# Patient Record
Sex: Female | Born: 1945
Health system: Southern US, Community
[De-identification: ages and names within clinical notes are randomized; demographics above are authoritative.]

## PROBLEM LIST (undated history)

## (undated) DIAGNOSIS — D649 Anemia, unspecified: Secondary | ICD-10-CM

## (undated) DIAGNOSIS — Z800 Family history of malignant neoplasm of digestive organs: Secondary | ICD-10-CM

## (undated) DIAGNOSIS — Z803 Family history of malignant neoplasm of breast: Secondary | ICD-10-CM

## (undated) DIAGNOSIS — D469 Myelodysplastic syndrome, unspecified: Secondary | ICD-10-CM

## (undated) DIAGNOSIS — Z9889 Other specified postprocedural states: Secondary | ICD-10-CM

## (undated) DIAGNOSIS — R112 Nausea with vomiting, unspecified: Secondary | ICD-10-CM

## (undated) DIAGNOSIS — G629 Polyneuropathy, unspecified: Secondary | ICD-10-CM

## (undated) DIAGNOSIS — Z9221 Personal history of antineoplastic chemotherapy: Secondary | ICD-10-CM

## (undated) DIAGNOSIS — I471 Supraventricular tachycardia, unspecified: Secondary | ICD-10-CM

## (undated) DIAGNOSIS — D6861 Antiphospholipid syndrome: Secondary | ICD-10-CM

## (undated) DIAGNOSIS — C4490 Unspecified malignant neoplasm of skin, unspecified: Secondary | ICD-10-CM

## (undated) DIAGNOSIS — Z923 Personal history of irradiation: Secondary | ICD-10-CM

## (undated) DIAGNOSIS — C50919 Malignant neoplasm of unspecified site of unspecified female breast: Secondary | ICD-10-CM

## (undated) DIAGNOSIS — IMO0001 Reserved for inherently not codable concepts without codable children: Secondary | ICD-10-CM

## (undated) DIAGNOSIS — Z8041 Family history of malignant neoplasm of ovary: Secondary | ICD-10-CM

## (undated) HISTORY — DX: Family history of malignant neoplasm of breast: Z80.3

## (undated) HISTORY — PX: DILATION AND CURETTAGE OF UTERUS: SHX78

## (undated) HISTORY — DX: Personal history of antineoplastic chemotherapy: Z92.21

## (undated) HISTORY — DX: Reserved for inherently not codable concepts without codable children: IMO0001

## (undated) HISTORY — DX: Family history of malignant neoplasm of digestive organs: Z80.0

## (undated) HISTORY — DX: Unspecified malignant neoplasm of skin, unspecified: C44.90

## (undated) HISTORY — DX: Supraventricular tachycardia, unspecified: I47.10

## (undated) HISTORY — DX: Family history of malignant neoplasm of ovary: Z80.41

## (undated) HISTORY — DX: Myelodysplastic syndrome, unspecified: D46.9

## (undated) HISTORY — DX: Anemia, unspecified: D64.9

## (undated) HISTORY — PX: EYE SURGERY: SHX253

## (undated) HISTORY — PX: MASTECTOMY: SHX3

## (undated) HISTORY — PX: AUGMENTATION MAMMAPLASTY: SUR837

## (undated) HISTORY — DX: Polyneuropathy, unspecified: G62.9

## (undated) HISTORY — DX: Antiphospholipid syndrome: D68.61

## (undated) HISTORY — PX: COLONOSCOPY: SHX174

## (undated) HISTORY — DX: Supraventricular tachycardia: I47.1

---

## 1997-09-28 ENCOUNTER — Other Ambulatory Visit: Admission: RE | Admit: 1997-09-28 | Discharge: 1997-09-28 | Payer: Self-pay | Admitting: Obstetrics and Gynecology

## 1998-06-16 ENCOUNTER — Emergency Department (HOSPITAL_COMMUNITY): Admission: EM | Admit: 1998-06-16 | Discharge: 1998-06-16 | Payer: Self-pay | Admitting: Emergency Medicine

## 1999-01-28 ENCOUNTER — Other Ambulatory Visit: Admission: RE | Admit: 1999-01-28 | Discharge: 1999-01-28 | Payer: Self-pay | Admitting: Obstetrics and Gynecology

## 2000-03-16 ENCOUNTER — Other Ambulatory Visit: Admission: RE | Admit: 2000-03-16 | Discharge: 2000-03-16 | Payer: Self-pay | Admitting: Obstetrics and Gynecology

## 2000-08-09 ENCOUNTER — Emergency Department (HOSPITAL_COMMUNITY): Admission: EM | Admit: 2000-08-09 | Discharge: 2000-08-10 | Payer: Self-pay | Admitting: Internal Medicine

## 2000-08-09 ENCOUNTER — Encounter: Payer: Self-pay | Admitting: Emergency Medicine

## 2001-08-16 ENCOUNTER — Other Ambulatory Visit: Admission: RE | Admit: 2001-08-16 | Discharge: 2001-08-16 | Payer: Self-pay | Admitting: Obstetrics and Gynecology

## 2003-03-04 ENCOUNTER — Emergency Department (HOSPITAL_COMMUNITY): Admission: EM | Admit: 2003-03-04 | Discharge: 2003-03-04 | Payer: Self-pay

## 2003-11-27 ENCOUNTER — Other Ambulatory Visit: Admission: RE | Admit: 2003-11-27 | Discharge: 2003-11-27 | Payer: Self-pay | Admitting: Obstetrics and Gynecology

## 2005-01-30 ENCOUNTER — Encounter: Admission: RE | Admit: 2005-01-30 | Discharge: 2005-01-30 | Payer: Self-pay | Admitting: Obstetrics and Gynecology

## 2005-02-11 ENCOUNTER — Other Ambulatory Visit: Admission: RE | Admit: 2005-02-11 | Discharge: 2005-02-11 | Payer: Self-pay | Admitting: Obstetrics and Gynecology

## 2005-11-04 ENCOUNTER — Observation Stay (HOSPITAL_COMMUNITY): Admission: EM | Admit: 2005-11-04 | Discharge: 2005-11-04 | Payer: Self-pay | Admitting: Emergency Medicine

## 2010-07-01 ENCOUNTER — Other Ambulatory Visit: Payer: Self-pay | Admitting: Cardiology

## 2010-07-01 NOTE — Telephone Encounter (Signed)
escribe medication per fax request  

## 2010-12-26 ENCOUNTER — Other Ambulatory Visit: Payer: Self-pay | Admitting: Cardiology

## 2010-12-29 ENCOUNTER — Telehealth: Payer: Self-pay | Admitting: Cardiology

## 2010-12-29 MED ORDER — METOPROLOL SUCCINATE ER 100 MG PO TB24: 100.0000 mg | ORAL_TABLET | Freq: Every day | ORAL | Status: DC

## 2010-12-29 NOTE — Telephone Encounter (Signed)
Pt called and said the pharmacy told her that is was denied until she made an appt with Dr [place] She made a followup on December 5 She needs refill metorprolol she has not pills left Please call to [employer]

## 2011-02-04 ENCOUNTER — Ambulatory Visit (INDEPENDENT_AMBULATORY_CARE_PROVIDER_SITE_OTHER): Payer: BC Managed Care – PPO | Admitting: Cardiology

## 2011-02-04 ENCOUNTER — Encounter: Payer: Self-pay | Admitting: Cardiology

## 2011-02-04 VITALS — BP 118/72 | HR 71 | Ht 65.0 in | Wt 192.8 lb

## 2011-02-04 DIAGNOSIS — I471 Supraventricular tachycardia: Secondary | ICD-10-CM

## 2011-02-04 DIAGNOSIS — I498 Other specified cardiac arrhythmias: Secondary | ICD-10-CM

## 2011-02-04 MED ORDER — METOPROLOL SUCCINATE ER 100 MG PO TB24: 100.0000 mg | ORAL_TABLET | Freq: Every day | ORAL | Status: DC

## 2011-02-04 NOTE — Patient Instructions (Signed)
Continue your current therapy  I will see you again in 1 year.   

## 2011-02-04 NOTE — Progress Notes (Signed)
   Marie Anderson Date of Birth: May 15, 1945 Medical Record #[id number]  History of Present Illness: Marie Anderson is seen for yearly followup. She has a history of supraventricular tachycardia that has been well controlled with metoprolol. She is currently taking 100 mg daily. She cannot recall when she last had an episode of tachycardia. The last episode I have a record of was in September of 2007. She is feeling well. She denies any dyspnea, chest pain, or fatigue.  No current outpatient prescriptions on file prior to visit.    Allergies  Allergen Reactions  . Codeine     Past Medical History  Diagnosis Date  . SVT (supraventricular tachycardia)   . Anemia     No past surgical history on file.  History  Smoking status  . Never Smoker   Smokeless tobacco  . Not on file    History  Alcohol Use: Not on file    No family history on file.  Review of Systems: As noted in history of present illness.  All other systems were reviewed and are negative.  Physical Exam: BP 118/72  Pulse 71  Ht 5\' 5"  (1.651 m)  Wt 87.454 kg (192 lb 12.8 oz)  BMI 32.08 kg/m2 The patient is alert and oriented x 3.  The mood and affect are normal.  The skin is warm and dry.  Color is normal.  The HEENT exam reveals that the sclera are nonicteric.  The mucous membranes are moist.  The carotids are 2+ without bruits.  There is no thyromegaly.  There is no JVD.  The lungs are clear.  The chest wall is non tender.  The heart exam reveals a regular rate with a normal S1 and S2.  There are no murmurs, gallops, or rubs.  The PMI is not displaced.   Abdominal exam reveals good bowel sounds.  There is no guarding or rebound.  There is no hepatosplenomegaly or tenderness.  There are no masses.  Exam of the legs reveal no clubbing, cyanosis, or edema.  The legs are without rashes.  The distal pulses are intact.  Cranial nerves II - XII are intact.  Motor and sensory functions are intact.  The gait is  normal.  LABORATORY DATA: ECG today demonstrates normal sinus rhythm with nonspecific ST-T wave abnormality.  Assessment / Plan:

## 2011-02-04 NOTE — Assessment & Plan Note (Signed)
Her arrhythmia is very well controlled on metoprolol. We agreed that we should continue on metoprolol at 100 mg daily. I'll followup again in one year.

## 2011-02-18 ENCOUNTER — Other Ambulatory Visit: Payer: Self-pay | Admitting: Cardiology

## 2011-11-27 ENCOUNTER — Other Ambulatory Visit: Payer: Self-pay | Admitting: Obstetrics and Gynecology

## 2011-11-27 DIAGNOSIS — N63 Unspecified lump in unspecified breast: Secondary | ICD-10-CM

## 2011-12-01 ENCOUNTER — Ambulatory Visit
Admission: RE | Admit: 2011-12-01 | Discharge: 2011-12-01 | Disposition: A | Discharge status: Home / Self Care | Payer: BC Managed Care – PPO | Source: Ambulatory Visit | Attending: Obstetrics and Gynecology | Admitting: Obstetrics and Gynecology

## 2011-12-01 ENCOUNTER — Other Ambulatory Visit: Payer: Self-pay | Admitting: Obstetrics and Gynecology

## 2011-12-01 DIAGNOSIS — N63 Unspecified lump in unspecified breast: Secondary | ICD-10-CM

## 2011-12-04 ENCOUNTER — Other Ambulatory Visit: Payer: Self-pay | Admitting: Obstetrics and Gynecology

## 2011-12-04 ENCOUNTER — Ambulatory Visit
Admission: RE | Admit: 2011-12-04 | Discharge: 2011-12-04 | Disposition: A | Discharge status: Home / Self Care | Payer: BC Managed Care – PPO | Source: Ambulatory Visit | Attending: Obstetrics and Gynecology | Admitting: Obstetrics and Gynecology

## 2011-12-04 DIAGNOSIS — N63 Unspecified lump in unspecified breast: Secondary | ICD-10-CM

## 2011-12-04 DIAGNOSIS — C50919 Malignant neoplasm of unspecified site of unspecified female breast: Secondary | ICD-10-CM

## 2011-12-04 HISTORY — DX: Malignant neoplasm of unspecified site of unspecified female breast: C50.919

## 2011-12-07 ENCOUNTER — Other Ambulatory Visit: Payer: BC Managed Care – PPO

## 2011-12-07 ENCOUNTER — Other Ambulatory Visit: Payer: Self-pay | Admitting: Obstetrics and Gynecology

## 2011-12-07 ENCOUNTER — Ambulatory Visit
Admission: RE | Admit: 2011-12-07 | Discharge: 2011-12-07 | Disposition: A | Discharge status: Home / Self Care | Payer: BC Managed Care – PPO | Source: Ambulatory Visit | Attending: Obstetrics and Gynecology | Admitting: Obstetrics and Gynecology

## 2011-12-07 DIAGNOSIS — C50912 Malignant neoplasm of unspecified site of left female breast: Secondary | ICD-10-CM

## 2011-12-07 DIAGNOSIS — N63 Unspecified lump in unspecified breast: Secondary | ICD-10-CM

## 2011-12-10 ENCOUNTER — Telehealth: Payer: Self-pay | Admitting: *Deleted

## 2011-12-10 DIAGNOSIS — C50219 Malignant neoplasm of upper-inner quadrant of unspecified female breast: Secondary | ICD-10-CM

## 2011-12-10 DIAGNOSIS — C50212 Malignant neoplasm of upper-inner quadrant of left female breast: Secondary | ICD-10-CM | POA: Insufficient documentation

## 2011-12-10 NOTE — Telephone Encounter (Signed)
Confirmed [facility] for 12/16/11 at 0800 .  Instructions and contact information given.  

## 2011-12-11 ENCOUNTER — Ambulatory Visit
Admission: RE | Admit: 2011-12-11 | Discharge: 2011-12-11 | Disposition: A | Discharge status: Home / Self Care | Payer: BC Managed Care – PPO | Source: Ambulatory Visit | Attending: Obstetrics and Gynecology | Admitting: Obstetrics and Gynecology

## 2011-12-11 DIAGNOSIS — C50912 Malignant neoplasm of unspecified site of left female breast: Secondary | ICD-10-CM

## 2011-12-11 MED ORDER — GADOBENATE DIMEGLUMINE 529 MG/ML IV SOLN
20.0000 mL | Freq: Once | INTRAVENOUS | Status: AC | PRN
  Administered 2011-12-11: 20 mL via INTRAVENOUS

## 2011-12-14 ENCOUNTER — Other Ambulatory Visit: Payer: Self-pay | Admitting: Obstetrics and Gynecology

## 2011-12-14 ENCOUNTER — Ambulatory Visit
Admission: RE | Admit: 2011-12-14 | Discharge: 2011-12-14 | Disposition: A | Discharge status: Home / Self Care | Payer: BC Managed Care – PPO | Source: Ambulatory Visit | Attending: Obstetrics and Gynecology | Admitting: Obstetrics and Gynecology

## 2011-12-14 DIAGNOSIS — R928 Other abnormal and inconclusive findings on diagnostic imaging of breast: Secondary | ICD-10-CM

## 2011-12-16 ENCOUNTER — Ambulatory Visit (HOSPITAL_BASED_OUTPATIENT_CLINIC_OR_DEPARTMENT_OTHER): Payer: BC Managed Care – PPO | Admitting: General Surgery

## 2011-12-16 ENCOUNTER — Ambulatory Visit
Admission: RE | Admit: 2011-12-16 | Discharge: 2011-12-16 | Disposition: A | Discharge status: Home / Self Care | Payer: BC Managed Care – PPO | Source: Ambulatory Visit | Attending: Radiation Oncology | Admitting: Radiation Oncology

## 2011-12-16 ENCOUNTER — Encounter (INDEPENDENT_AMBULATORY_CARE_PROVIDER_SITE_OTHER): Payer: Self-pay | Admitting: General Surgery

## 2011-12-16 ENCOUNTER — Encounter: Payer: Self-pay | Admitting: *Deleted

## 2011-12-16 ENCOUNTER — Other Ambulatory Visit (INDEPENDENT_AMBULATORY_CARE_PROVIDER_SITE_OTHER): Payer: Self-pay | Admitting: General Surgery

## 2011-12-16 ENCOUNTER — Ambulatory Visit (HOSPITAL_BASED_OUTPATIENT_CLINIC_OR_DEPARTMENT_OTHER): Payer: BC Managed Care – PPO | Admitting: Oncology

## 2011-12-16 ENCOUNTER — Ambulatory Visit (HOSPITAL_BASED_OUTPATIENT_CLINIC_OR_DEPARTMENT_OTHER): Payer: BC Managed Care – PPO

## 2011-12-16 ENCOUNTER — Encounter: Payer: Self-pay | Admitting: Oncology

## 2011-12-16 ENCOUNTER — Other Ambulatory Visit (HOSPITAL_BASED_OUTPATIENT_CLINIC_OR_DEPARTMENT_OTHER): Payer: BC Managed Care – PPO | Admitting: Lab

## 2011-12-16 ENCOUNTER — Telehealth: Payer: Self-pay | Admitting: *Deleted

## 2011-12-16 ENCOUNTER — Telehealth: Payer: Self-pay | Admitting: Oncology

## 2011-12-16 ENCOUNTER — Ambulatory Visit: Payer: BC Managed Care – PPO | Attending: General Surgery | Admitting: Physical Therapy

## 2011-12-16 VITALS — BP 160/92 | HR 76 | Temp 98.3°F | Resp 20 | Ht 65.0 in | Wt 208.0 lb

## 2011-12-16 DIAGNOSIS — C50919 Malignant neoplasm of unspecified site of unspecified female breast: Secondary | ICD-10-CM | POA: Insufficient documentation

## 2011-12-16 DIAGNOSIS — C50219 Malignant neoplasm of upper-inner quadrant of unspecified female breast: Secondary | ICD-10-CM

## 2011-12-16 DIAGNOSIS — M25619 Stiffness of unspecified shoulder, not elsewhere classified: Secondary | ICD-10-CM | POA: Insufficient documentation

## 2011-12-16 DIAGNOSIS — R293 Abnormal posture: Secondary | ICD-10-CM | POA: Insufficient documentation

## 2011-12-16 DIAGNOSIS — Z170 Estrogen receptor positive status [ER+]: Secondary | ICD-10-CM

## 2011-12-16 DIAGNOSIS — IMO0001 Reserved for inherently not codable concepts without codable children: Secondary | ICD-10-CM | POA: Insufficient documentation

## 2011-12-16 LAB — CBC WITH DIFFERENTIAL/PLATELET
BASO%: 0.4 % (ref 0.0–2.0)
EOS%: 1 % (ref 0.0–7.0)
HGB: 14.6 g/dL (ref 11.6–15.9)
MCH: 33.7 pg (ref 25.1–34.0)
MCHC: 34.2 g/dL (ref 31.5–36.0)
MCV: 98.5 fL (ref 79.5–101.0)
MONO%: 7.6 % (ref 0.0–14.0)
RBC: 4.33 10*6/uL (ref 3.70–5.45)
RDW: 13.8 % (ref 11.2–14.5)
lymph#: 1 10*3/uL (ref 0.9–3.3)

## 2011-12-16 LAB — COMPREHENSIVE METABOLIC PANEL (CC13)
ALT: 19 U/L (ref 0–55)
AST: 20 U/L (ref 5–34)
Albumin: 4 g/dL (ref 3.5–5.0)
Alkaline Phosphatase: 78 U/L (ref 40–150)
BUN: 10 mg/dL (ref 7.0–26.0)
Calcium: 9.2 mg/dL (ref 8.4–10.4)
Chloride: 108 mEq/L — ABNORMAL HIGH (ref 98–107)
Potassium: 4 mEq/L (ref 3.5–5.1)
Sodium: 141 mEq/L (ref 136–145)
Total Protein: 7.4 g/dL (ref 6.4–8.3)

## 2011-12-16 LAB — CANCER ANTIGEN 27.29: CA 27.29: 70 U/mL — ABNORMAL HIGH (ref 0–39)

## 2011-12-16 NOTE — Progress Notes (Signed)
Patient ID: RAVNEET SPILKER, female   DOB: [birth year], 66 y.o.   MRN: [id number]  Chief Complaint  Patient presents with  . Other    breast cancer     HPI DONISE WOODLE is a 66 y.o. female.  Referred by Dr. Richardean Chimera HPI 30 yof who has felt palpable left breast mass for the last 2 weeks.  She has no prior breast history and no family history of breast or ovarian cancer.  She underwent mmg that an irregular mass with pleomorphic and linear calcifications.  Calcs span about 2 cm.  Ultrasound shows a 10x9x35mm irregular hypoechoic at 12 o'clock position 5 cm from nipple.  A 3 mm satellite nodule lies 3 mm superomedial to the dominant mass.  Ultrasound guided biopsy performed with clip placement.  This biopsy shows grade II invasive ductal carcinoma with DCIS.  This is er positive at 100, pr positive at 70, Ki67 is 94% and her2 neu is negative.  She has since undergone an mr that shows a 2.7x2.2x2 cm cancer with another mass in left breast at 3 o'clock measuring 1.1x1.1x1 cm tumor.  Path shows IDC.  Clips are 6.8 cm farthest apart on mmg.  She is very interested in an attempt at [facility] and comes today to [facility] to discuss these options.  Past Medical History  Diagnosis Date  . Anemia   . SVT (supraventricular tachycardia)     Past Surgical History  Procedure Date  . Eye surgery     age 13    History reviewed. No pertinent family history.  Social History History  Substance Use Topics  . Smoking status: Never Smoker   . Smokeless tobacco: Never Used  . Alcohol Use: Yes    Allergies  Allergen Reactions  . Cephalosporins     headache  . Codeine Nausea Only    Current Outpatient Prescriptions  Medication Sig Dispense Refill  . Ascorbic Acid (VITAMIN C PO) Take 500 mg by mouth.       Marland Kitchen CALCIUM PO Take 1,200 mg by mouth.       . Cholecalciferol (VITAMIN D PO) Take 500 mg by mouth daily.       . Cyanocobalamin (VITAMIN B 12 PO) Take 3,000 mg by mouth daily.       Marland Kitchen ECHINACEA PO Take  800 mg by mouth daily.       . IRON PO Take 65 mg by mouth daily.       . metoprolol (TOPROL-XL) 100 MG 24 hr tablet Take 1 tablet (100 mg total) by mouth daily.  90 tablet  3    Review of Systems Review of Systems  Constitutional: Negative for fever, chills and unexpected weight change.  HENT: Negative for hearing loss, congestion, sore throat, trouble swallowing and voice change.   Eyes: Negative for visual disturbance.  Respiratory: Negative for cough and wheezing.   Cardiovascular: Negative for chest pain, palpitations and leg swelling.  Gastrointestinal: Negative for nausea, vomiting, abdominal pain, diarrhea, constipation, blood in stool, abdominal distention and anal bleeding.  Genitourinary: Negative for hematuria, vaginal bleeding and difficulty urinating.  Musculoskeletal: Negative for arthralgias.  Skin: Negative for rash and wound.  Neurological: Negative for seizures, syncope and headaches.  Hematological: Negative for adenopathy. Does not bruise/bleed easily.  Psychiatric/Behavioral: Negative for confusion.    There were no vitals taken for this visit.  Physical Exam Physical Exam  Vitals reviewed. Constitutional: She appears well-developed and well-nourished.  Neck: Neck supple.  Cardiovascular: Normal rate,  regular rhythm and normal heart sounds.   Pulmonary/Chest: Effort normal and breath sounds normal. She has no wheezes. She has no rales. Right breast exhibits no inverted nipple, no mass, no nipple discharge, no skin change and no tenderness. Left breast exhibits mass. Left breast exhibits no inverted nipple, no nipple discharge, no skin change and no tenderness. Breasts are symmetrical.    Lymphadenopathy:    She has no cervical adenopathy.    She has no axillary adenopathy.       Right: No supraclavicular adenopathy present.       Left: No supraclavicular adenopathy present.    Data Reviewed   BILATERAL BREAST MRI WITH AND WITHOUT CONTRAST   Technique:  Multiplanar, multisequence MR images of both breasts were obtained prior to and following the intravenous administration of 20ml of Multihance.  Three dimensional images were evaluated at the independent DynaCad workstation.   Comparison:  Prior mammograms and ultrasound   Findings: Post biopsy change is noted in the left breast 12 o'clock location as well as clip artifact corresponding to the biopsy- proven breast cancer.  No other abnormal T2-weighted hyperintensity on either side.  No lymphadenopathy.   Background parenchymal enhancement type pattern is mild.   In the left breast 12 o'clock location is an irregular spiculated enhancing mass with plateau type kinetics measuring 2.7 x 2.2 x 2.0 cm.  This corresponds to the biopsy-proven breast cancer.   In the left breast three o'clock location, there is a mildly irregular enhancing mass plateau type enhancing kinetics measuring 1.1 x 1.1 x 1.0 cm.   No dominant area of abnormal enhancement seen in the right breast.   IMPRESSION: Abnormal enhancing mass left breast 12 o'clock location at the site of biopsy-proven breast cancer.   A second abnormally enhancing mass in the left breast in the 3 o'clock location is identified; second look ultrasound is recommended for further evaluation and possible ultrasound-guided biopsy.  If no sonographic correlate is identified, MRI guided core biopsy would be recommended.   No evidence for malignancy in the right breast.   RECOMMENDATION: BI-RADS CATEGORY 4:  Suspicious abnormality - biopsy should be considered.   THREE-DIMENSIONAL MR IMAGE RENDERING ON INDEPENDENT WORKSTATION:   Three-dimensional MR images were rendered by post-processing of the original MR data on an independent workstation.  The three- dimensional MR images were interpreted, and findings were reported in the accompanying complete MRI report for this study.    Assessment    Clinical stage II left breast cancer,  multifocal    Plan    We had a long discussion about possible therapies.  I think she will likely need a mastectomy but this is basically her upper outer quadrant.  We discussed doing neoadjuvant therapy for this and we possibly could decrease the size of the larger and attempt to basically do a a quadrantectomy.  We discussed we would get mri at end of neoadjuvant and then decide.  I may very well recommend a mastectomy at that time.  She understands this.  I think this is really pushing the limits for breast conservation therapy. It is not unreasonable to give this a try though. She is a very good response stem we may be able to do this. She understands completely that she may very well and up with a mastectomy but that switching the order of therapy and giving her primary systemic therapy has no downside either. We discussed at length today the placement of a port for administration of chemotherapy.  We discussed the placement as well as the risks including but not limited to bleeding, infection, and pneumothorax. I will plan on putting her port in next week.       Guy Toney 12/16/2011, 12:34 PM

## 2011-12-16 NOTE — Patient Instructions (Addendum)
Proceed with neoadjuvant chemotherapy   Chemo class  Echo cardiogram

## 2011-12-16 NOTE — Progress Notes (Signed)
Radiation Oncology         [phone] ________________________________  Initial outpatient Consultation  Name: Marie Anderson MRN: [id number]  Date: 12/16/2011  DOB: November 16, 1945  REFERRING PHYSICIAN: Emelia Loron, MD  DIAGNOSIS: Stage II multifocal breast cancer  HISTORY OF PRESENT ILLNESS::Marie Anderson is a 66 y.o. female  who had a 2 week history of a palpable left breast mass. Mammogram revealed calcifications. An ultrasound confirmed a 10 x 9 x 8 mm lesion with a 3 mm satellite lesion. A biopsy was performed which showed a grade 3 invasive ductal carcinoma. This was ER/PR positive HER-2 positive with a Ki-67 of 94%. MRI of the bilateral breasts was performed on 12/11/2011 which showed an irregular spiculated enhancing mass corresponding to the biopsy-proven cancer measuring 2.7 x 2.2 x 2 cm. At at 3:00 position another irregular enhancing mass showed enhancement measuring 1.1 x 1.1 x 1.0 cm. An ultrasound-guided biopsy of this mass showed invasive ductal carcinoma with DCIS. This was felt to be grade 2. Post clip films indicated that these masses were 6.8 cm apart She was then referred to breast clinic today for discussion of her treatment options. She is interested in breast conservation.  PREVIOUS RADIATION THERAPY: No  PAST MEDICAL HISTORY:  has a past medical history of SVT (supraventricular tachycardia); Anemia; and SVT (supraventricular tachycardia).    PAST SURGICAL HISTORY: Past Surgical History  Procedure Date  . Eye surgery     age 76    FAMILY HISTORY: family history is not on file.  SOCIAL HISTORY:  reports that she has never smoked. She has never used smokeless tobacco. She reports that she does not use illicit drugs.  ALLERGIES: Cephalosporins and Codeine  MEDICATIONS:  Current Outpatient Prescriptions  Medication Sig Dispense Refill  . Ascorbic Acid (VITAMIN C PO) Take 500 mg by mouth.       Marland Kitchen CALCIUM PO Take 1,200 mg by mouth.       .  Cholecalciferol (VITAMIN D PO) Take 500 mg by mouth daily.       . Cyanocobalamin (VITAMIN B 12 PO) Take 3,000 mg by mouth daily.       Marland Kitchen ECHINACEA PO Take 800 mg by mouth daily.       . IRON PO Take 65 mg by mouth daily.       . metoprolol (TOPROL-XL) 100 MG 24 hr tablet Take 1 tablet (100 mg total) by mouth daily.  90 tablet  3    REVIEW OF SYSTEMS:  A 15 point review of systems is documented in the electronic medical record. This was obtained by the nursing staff. However, I reviewed this with the patient to discuss relevant findings and make appropriate changes.  Pertinent items are noted in HPI.   PHYSICAL EXAM:  vitals were not taken for this visit.  . She is a pleasant female who appears her stated age sitting comfortably examining table. She has large pendulous breasts bilaterally. She has a large palpable mass in the left upper outer quadrant. She has no palpable axillary or supraclavicular adenopathy. She is alert and oriented x3. She has no decreased range of motion of her arms and 5 out of 5 strength bilaterally.  LABORATORY DATA:  Lab Results  Component Value Date   WBC 3.8* 12/16/2011   HGB 14.6 12/16/2011   HCT 42.6 12/16/2011   MCV 98.5 12/16/2011   PLT 80* 12/16/2011   Lab Results  Component Value Date   NA 141 12/16/2011  K 4.0 12/16/2011   CL 108* 12/16/2011   CO2 24 12/16/2011   Lab Results  Component Value Date   ALT 19 12/16/2011   AST 20 12/16/2011   ALKPHOS 78 12/16/2011   BILITOT 0.60 12/16/2011     RADIOGRAPHY: US Breast Left  12/14/2011  *RADIOLOGY REPORT*  Clinical Data:  Abnormal MRI, the second look left breast ultrasound  LEFT BREAST ULTRASOUND  Comparison:  MRI dated 12/11/2011  On physical exam, I palpate normal tissue in the 3 o'clock position of the left breast, 3 cm from the nipple.  Findings: Ultrasound is performed, showing an oval heterogeneously hypoechoic mass with microlobulated margins in the 3 o'clock position, 3 cm the nipple  measuring 1.2 x 0.7 x 1.1 cm.  This corresponds to the area of concern on recent MRI.  IMPRESSION: Mass, left breast which biopsy is done and reported separately.  RECOMMENDATION: Ultrasound guided left breast biopsy.  BI-RADS CATEGORY 4:  Suspicious abnormality - biopsy should be considered.   Original Report Authenticated By: Hiram Gash, M.D.    US Breast Left  12/01/2011  *RADIOLOGY REPORT*  Clinical Data:  66 year old female with palpable lump in the upper left breast discovered on self examination.  Also for annual bilateral mammograms.  DIGITAL DIAGNOSTIC BILATERAL MAMMOGRAM WITH CAD AND LEFT BREAST ULTRASOUND:  Comparison:  03/18/2009 mammograms.  Findings:  A spot compression view of the left breast and routine views of both breasts again demonstrate heterogeneously dense breast tissue. Within the upper left breast, an irregular mass with pleomorphic and linear calcifications is identified.  These calcifications span a length of 2 cm. No other mass, distortion or suspicious calcifications are identified bilaterally. Mammographic images were processed with CAD.  On physical exam, a firm mass is identified at the 12 o'clock position of the left breast 5 cm from the nipple.  Ultrasound is performed, showing a 10 x 9 x 8 mm irregular hypoechoic mass at the 12 o'clock position of the left breast 5 cm from the nipple.  A 3 mm satellite nodule lies 3 mm superiomedial to the dominant mass. No definite enlarged abnormal appearing left axillary lymph nodes are identified.  IMPRESSION: 10 x 9 x 8 mm irregular mass in the upper left breast identified sonographically which likely represents the mammographic finding, as well as an adjacent 3 mm satellite nodule.  These findings are very worrisome for malignancy and tissue sampling is recommended.  These findings were discussed with the patient and her questions answered.  She was encouraged to Anderson/continue monthly self exams and to contact her primary  physician if any changes noted.  BI-RADS CATEGORY 5:  Highly suggestive of malignancy - appropriate action should be taken.  RECOMMENDATION: Ultrasound guided left breast biopsy.  This has been scheduled for 12/07/2011 and the patient informed.   Original Report Authenticated By: Rosendo Gros, M.D.    Mr Breast Bilateral W Wo Contrast  12/11/2011  *RADIOLOGY REPORT*  Clinical Data: Recently biopsy-proven left breast invasive ductal carcinoma manifesting as a palpable left breast mass 12 o'clock location.  BILATERAL BREAST MRI WITH AND WITHOUT CONTRAST  Technique: Multiplanar, multisequence MR images of both breasts were obtained prior to and following the intravenous administration of 20ml of Multihance.  Three dimensional images were evaluated at the independent DynaCad workstation.  Comparison:  Prior mammograms and ultrasound  Findings: Post biopsy change is noted in the left breast 12 o'clock location as well as clip artifact corresponding to the biopsy- proven breast cancer.  No other abnormal T2-weighted hyperintensity on either side.  No lymphadenopathy.  Background parenchymal enhancement type pattern is mild.  In the left breast 12 o'clock location is an irregular spiculated enhancing mass with plateau type kinetics measuring 2.7 x 2.2 x 2.0 cm.  This corresponds to the biopsy-proven breast cancer.  In the left breast three o'clock location, there is a mildly irregular enhancing mass plateau type enhancing kinetics measuring 1.1 x 1.1 x 1.0 cm.  No dominant area of abnormal enhancement seen in the right breast.  IMPRESSION: Abnormal enhancing mass left breast 12 o'clock location at the site of biopsy-proven breast cancer.  A second abnormally enhancing mass in the left breast in the 3 o'clock location is identified; second look ultrasound is recommended for further evaluation and possible ultrasound-guided biopsy.  If no sonographic correlate is identified, MRI guided core biopsy would be recommended.   No evidence for malignancy in the right breast.  RECOMMENDATION: BI-RADS CATEGORY 4:  Suspicious abnormality - biopsy should be considered.  THREE-DIMENSIONAL MR IMAGE RENDERING ON INDEPENDENT WORKSTATION:  Three-dimensional MR images were rendered by post-processing of the original MR data on an independent workstation.  The three- dimensional MR images were interpreted, and findings were reported in the accompanying complete MRI report for this study.  We will contact the patient to arrange for follow-up ultrasound.   Original Report Authenticated By: Harrel Lemon, M.D.    [place] Core Biopsy  12/15/2011  **ADDENDUM** CREATED: 12/15/2011 12:01:32  Histologic evaluation demonstrates invasive and in situ mammary carcinoma.  Grade II ductal carcinoma is favored.  This is concordant with the imaging findings.  Results were discussed with the patient by telephone at her request.  She reports no complications from the procedure.  She is scheduled to be seen in the [facility] on 12/16/2011.  **END ADDENDUM** SIGNED BY: Cain Saupe, M.D.   12/14/2011  *RADIOLOGY REPORT*  Clinical Data:  New diagnosis left sided breast cancer.  Abnormal MRI.  ULTRASOUND GUIDED CORE BIOPSY OF THE left BREAST  Comparison: Previous exams.  I met with the patient and we discussed the procedure of ultrasound- guided biopsy, including benefits and alternatives.  We discussed the high likelihood of a successful procedure. We discussed the risks of the procedure, including infection, bleeding, tissue injury, clip migration, and inadequate sampling.  Informed written consent was given.  Using sterile technique 2% lidocaine, ultrasound guidance and a 14 gauge automated biopsy device, biopsy was performed of the mass using a  lateral approach.  At the conclusion of the procedure a wing tissue marker clip was deployed into the biopsy cavity. Follow up 2 view mammogram was performed and dictated separately.   IMPRESSION: Ultrasound guided biopsy of a left breast mass.  No apparent complications.  Original Report Authenticated By: Daryl Eastern, M.D.    [place] Core Biopsy  12/04/2011  *RADIOLOGY REPORT*  Clinical Data:  Suspicious left breast mass 12 o'clock location  ULTRASOUND GUIDED VACUUM ASSISTED CORE BIOPSY OF THE LEFT BREAST  Comparison: Previous exams.  I met with the patient and we discussed the procedure of ultrasound- guided biopsy, including benefits and alternatives.  We discussed the high likelihood of a successful procedure. We discussed the risks of the procedure including infection, bleeding, tissue injury, clip migration, and inadequate sampling.  Informed written consent was given.  Using sterile technique, 2% lidocaine ultrasound guidance and a 12 gauge vacuum assisted needle biopsy was performed of left breast mass 12 o'clock location using  a lateral to medial approach.  At the conclusion of the procedure, a ribbon shaped tissue marker clip was deployed into the biopsy cavity.  Follow-up 2-view mammogram was performed and dictated separately.  IMPRESSION:  Ultrasound-guided biopsy of left breast mass 12 o'clock location with ribbon shaped clip placement.  Pathology is pending.  No apparent complications.   Original Report Authenticated By: Harrel Lemon, M.D.    Mm Digital Diagnostic Bilat  12/01/2011  *RADIOLOGY REPORT*  Clinical Data:  66 year old female with palpable lump in the upper left breast discovered on self examination.  Also for annual bilateral mammograms.  DIGITAL DIAGNOSTIC BILATERAL MAMMOGRAM WITH CAD AND LEFT BREAST ULTRASOUND:  Comparison:  03/18/2009 mammograms.  Findings:  A spot compression view of the left breast and routine views of both breasts again demonstrate heterogeneously dense breast tissue. Within the upper left breast, an irregular mass with pleomorphic and linear calcifications is identified.  These calcifications span a length of 2 cm. No other mass,  distortion or suspicious calcifications are identified bilaterally. Mammographic images were processed with CAD.  On physical exam, a firm mass is identified at the 12 o'clock position of the left breast 5 cm from the nipple.  Ultrasound is performed, showing a 10 x 9 x 8 mm irregular hypoechoic mass at the 12 o'clock position of the left breast 5 cm from the nipple.  A 3 mm satellite nodule lies 3 mm superiomedial to the dominant mass. No definite enlarged abnormal appearing left axillary lymph nodes are identified.  IMPRESSION: 10 x 9 x 8 mm irregular mass in the upper left breast identified sonographically which likely represents the mammographic finding, as well as an adjacent 3 mm satellite nodule.  These findings are very worrisome for malignancy and tissue sampling is recommended.  These findings were discussed with the patient and her questions answered.  She was encouraged to Anderson/continue monthly self exams and to contact her primary physician if any changes noted.  BI-RADS CATEGORY 5:  Highly suggestive of malignancy - appropriate action should be taken.  RECOMMENDATION: Ultrasound guided left breast biopsy.  This has been scheduled for 12/07/2011 and the patient informed.   Original Report Authenticated By: Rosendo Gros, M.D.    Mm Digital Diagnostic Unilat L  12/14/2011  *RADIOLOGY REPORT*  Clinical Data:  Known malignancy, left breast.  Ultrasound core biopsy for an area seen on recent MRI.  DIGITAL DIAGNOSTIC LEFT MAMMOGRAM  Comparison:  Previous exams.  Findings:  Films are performed following ultrasound guided biopsy of a left breast mass.  Images show a wing type clip in association with the mass seen sonographically in the 3 o'clock position of the left breast.  The clip is 6.8 cm lateral and inferior to the known malignancy.  IMPRESSION: Adequate clip placement following ultrasound guided left breast biopsy.   Original Report Authenticated By: Hiram Gash, M.D.    Mm Digital  Diagnostic Unilat L  12/04/2011  *RADIOLOGY REPORT*  Clinical Data:  Ultrasound-guided left breast biopsy with clip placement  DIGITAL DIAGNOSTIC LEFT MAMMOGRAM  Comparison:  Previous exams.  Findings:  Films are performed following ultrasound guided biopsy of left breast mass 12 o'clock location.  The ribbon shaped clip is appropriately located at the site of the mammographically evident mass in the 12 o'clock location with associated calcifications.  IMPRESSION: Appropriate ribbon shaped clip location at the site of the biopsied left breast 12 o'clock location mass.   Original Report Authenticated By: Harrel Lemon, M.D.  Mm Radiologist Eval And Mgmt  12/07/2011  *RADIOLOGY REPORT*  Clinical Data: Post biopsy evaluation  CONSULTATION  Comparison: December 01, 2011, March 18, 2009  Findings: The pathology revealed invasive ductal carcinoma and ductal carcinoma ins situ.  This is found to be concordant with imaging findings.  I discussed the results with the the patient. The biopsy site is clean.  There is no complications.  The patient states she is doing well without problems post biopsy.  The patient has appointment for [facility] on December 16, 2011.  The patient has appointment for MRI of the breasts on December 11, 2011.  IMPRESSION: Post biopsy consultation as described.  Recommend MRI of the breasts and and  [facility] as described.   Original Report Authenticated By: Sherian Rein, M.D.       IMPRESSION: T2 N0 multifocal invasive ductal carcinoma of the left breast  PLAN: I spoke with Ms. filming and her family today briefly regarding the indications for radiation after lumpectomy. She is interested in breast conservation and a discussion with her surgeon and medical oncology has resulted in her pursuing neoadjuvant chemotherapy followed by lumpectomy. We discussed that the indications for radiation were on her upfront tumor characteristics and even a pathologic complete response within the  breast to chemotherapy would still require adjuvant radiation. We discussed the use of radiation to decrease local failures. We discussed that if she finally elected for mastectomy that I did not see an indication right now for adjuvant radiation post mastectomy. We discussed the process of simulation the placement tattoos. We discussed the use of breath hold technique for cardiac sparing. We discussed 6 weeks of treatment as an outpatient beginning 1 month to 6 weeks after surgery after she is completely healed. We discussed the possible side effects of treatment including but not limited to skin redness and fatigue. I told her I would meet with her again after chemotherapy and her surgery and we will discuss radiation in greater detail. She will be treated with Herceptin to finish out a year as well as an antiestrogen. He has met with the surgeon medical oncology a physical therapist and a member of our patient family support team.  I spent 60 minutes  face to face with the patient and more than 50% of that time was spent in counseling and/or coordination of care.   ------------------------------------------------  Lurline Hare, MD

## 2011-12-16 NOTE — Progress Notes (Signed)
Checked in new patient. She had the [facility] form. No financial issues today.

## 2011-12-16 NOTE — Addendum Note (Signed)
Encounter addended by: Lurline Hare, MD on: 12/16/2011  4:38 PM<BR>     Documentation filed: Visit Diagnoses

## 2011-12-16 NOTE — Progress Notes (Signed)
[facility] Psychosocial Distress Screening Clinical Social Work  Patient completed distress screening protocol, and scored a 7 on the Psychosocial Distress Thermometer which indicates moderate distress. Clinical Social Worker met with patient ans patient's family in [facility] to assess for distress and other psychosocial needs.  Pt stated she felt "better" after meeting with the physicians and begin given her treatment plan.  Pt did have questions regarding wigs and resources available.  CSW informed pt of the wig vouchers available and ACS resources.  CSW also informed pt of the support team and support services at [facility], and encouraged pt to call with any questions or concerns.        Clinical Social Worker follow up needed:not at this time  Tamala Julian, MSW, LCSW Clinical Social Worker [facility] [phone]

## 2011-12-16 NOTE — Telephone Encounter (Signed)
lmonvm adviisng the pt of her chemo educ class appt on 12/21/2011@10 :00am. Pt to get her schedules at that time

## 2011-12-16 NOTE — Telephone Encounter (Signed)
Sent michelle email to set up patient treatment starting at 12-31-2011 every 21 days cycle of 6

## 2011-12-17 ENCOUNTER — Encounter (HOSPITAL_BASED_OUTPATIENT_CLINIC_OR_DEPARTMENT_OTHER): Payer: Self-pay | Admitting: *Deleted

## 2011-12-17 NOTE — Progress Notes (Signed)
To come in for bmet 

## 2011-12-21 ENCOUNTER — Other Ambulatory Visit: Payer: BC Managed Care – PPO

## 2011-12-21 ENCOUNTER — Telehealth: Payer: Self-pay | Admitting: Oncology

## 2011-12-21 ENCOUNTER — Telehealth: Payer: Self-pay | Admitting: *Deleted

## 2011-12-21 NOTE — Telephone Encounter (Signed)
Patient confirmed over the phone the new date and time on 12-22-2011 starting at 10:30am

## 2011-12-21 NOTE — Telephone Encounter (Signed)
S/w the pt and she is aware of her appt at [facility] on 12/25/2011 for the echo appt.

## 2011-12-22 ENCOUNTER — Telehealth: Payer: Self-pay | Admitting: *Deleted

## 2011-12-22 ENCOUNTER — Ambulatory Visit (HOSPITAL_BASED_OUTPATIENT_CLINIC_OR_DEPARTMENT_OTHER): Payer: BC Managed Care – PPO | Admitting: Oncology

## 2011-12-22 VITALS — BP 143/83 | HR 78 | Temp 98.5°F | Resp 20 | Ht 65.0 in | Wt 206.4 lb

## 2011-12-22 DIAGNOSIS — C50219 Malignant neoplasm of upper-inner quadrant of unspecified female breast: Secondary | ICD-10-CM

## 2011-12-22 DIAGNOSIS — Z170 Estrogen receptor positive status [ER+]: Secondary | ICD-10-CM

## 2011-12-22 DIAGNOSIS — C50919 Malignant neoplasm of unspecified site of unspecified female breast: Secondary | ICD-10-CM

## 2011-12-22 MED ORDER — ONDANSETRON HCL 8 MG PO TABS: ORAL_TABLET | ORAL | Status: DC

## 2011-12-22 MED ORDER — PROCHLORPERAZINE MALEATE 10 MG PO TABS: 10.0000 mg | ORAL_TABLET | Freq: Four times a day (QID) | ORAL | Status: DC | PRN

## 2011-12-22 MED ORDER — DEXAMETHASONE 4 MG PO TABS: ORAL_TABLET | ORAL | Status: DC

## 2011-12-22 MED ORDER — PROCHLORPERAZINE 25 MG RE SUPP: 25.0000 mg | Freq: Two times a day (BID) | RECTAL | Status: DC | PRN

## 2011-12-22 MED ORDER — LIDOCAINE-PRILOCAINE 2.5-2.5 % EX CREA: TOPICAL_CREAM | CUTANEOUS | Status: DC | PRN

## 2011-12-22 MED ORDER — LORAZEPAM 0.5 MG PO TABS: 0.5000 mg | ORAL_TABLET | Freq: Four times a day (QID) | ORAL | Status: DC | PRN

## 2011-12-22 NOTE — Telephone Encounter (Signed)
labs on 10/31, 11/7, 11/21, 11/29, 12/12, 12/19, 03/03/12, 03-11-2012  see [facility] on 10/31, 11/7, 11/21, 11/29, 12/12, 12/19, 03/03/12, SEE [facility] on 1/10  Sent michelle email to set up patient of all treatment  Patient is aware of all appointments

## 2011-12-22 NOTE — Telephone Encounter (Signed)
Per staff message and POF I have scheduled appts.  JMW  

## 2011-12-22 NOTE — Patient Instructions (Addendum)
Proceed with chemotherapy on 12/31/11,

## 2011-12-23 ENCOUNTER — Encounter (HOSPITAL_BASED_OUTPATIENT_CLINIC_OR_DEPARTMENT_OTHER): Payer: Self-pay | Admitting: *Deleted

## 2011-12-23 ENCOUNTER — Telehealth: Payer: Self-pay | Admitting: Cardiology

## 2011-12-23 ENCOUNTER — Ambulatory Visit (HOSPITAL_COMMUNITY): Payer: BC Managed Care – PPO

## 2011-12-23 ENCOUNTER — Encounter (HOSPITAL_BASED_OUTPATIENT_CLINIC_OR_DEPARTMENT_OTHER)
Admission: RE | Disposition: A | Discharge status: Home / Self Care | Payer: Self-pay | Source: Ambulatory Visit | Attending: General Surgery

## 2011-12-23 ENCOUNTER — Telehealth: Payer: Self-pay | Admitting: Cardiovascular Disease

## 2011-12-23 ENCOUNTER — Other Ambulatory Visit: Payer: Self-pay

## 2011-12-23 ENCOUNTER — Encounter (HOSPITAL_BASED_OUTPATIENT_CLINIC_OR_DEPARTMENT_OTHER): Payer: Self-pay | Admitting: Certified Registered Nurse Anesthetist

## 2011-12-23 ENCOUNTER — Ambulatory Visit (HOSPITAL_BASED_OUTPATIENT_CLINIC_OR_DEPARTMENT_OTHER): Payer: BC Managed Care – PPO | Admitting: Certified Registered Nurse Anesthetist

## 2011-12-23 ENCOUNTER — Ambulatory Visit (HOSPITAL_BASED_OUTPATIENT_CLINIC_OR_DEPARTMENT_OTHER)
Admission: RE | Admit: 2011-12-23 | Discharge: 2011-12-23 | Disposition: A | Discharge status: Home / Self Care | Payer: BC Managed Care – PPO | Source: Ambulatory Visit | Attending: General Surgery | Admitting: General Surgery

## 2011-12-23 DIAGNOSIS — C50919 Malignant neoplasm of unspecified site of unspecified female breast: Secondary | ICD-10-CM | POA: Insufficient documentation

## 2011-12-23 HISTORY — PX: PORTACATH PLACEMENT: SHX2246

## 2011-12-23 LAB — POCT I-STAT, CHEM 8
Calcium, Ion: 1.17 mmol/L (ref 1.13–1.30)
Glucose, Bld: 106 mg/dL — ABNORMAL HIGH (ref 70–99)
HCT: 43 % (ref 36.0–46.0)
Hemoglobin: 14.6 g/dL (ref 12.0–15.0)
Potassium: 3.8 mEq/L (ref 3.5–5.1)
TCO2: 24 mmol/L (ref 0–100)

## 2011-12-23 SURGERY — INSERTION, TUNNELED CENTRAL VENOUS DEVICE, WITH PORT
Anesthesia: General | Site: Chest | Laterality: Right | Wound class: Clean

## 2011-12-23 MED ORDER — MIDAZOLAM HCL 5 MG/5ML IJ SOLN
INTRAMUSCULAR | Status: DC | PRN
  Administered 2011-12-23: 1 mg via INTRAVENOUS

## 2011-12-23 MED ORDER — HEPARIN (PORCINE) IN NACL 2-0.9 UNIT/ML-% IJ SOLN
INTRAMUSCULAR | Status: DC | PRN
  Administered 2011-12-23: 1 via INTRAVENOUS

## 2011-12-23 MED ORDER — BUPIVACAINE HCL (PF) 0.25 % IJ SOLN
INTRAMUSCULAR | Status: DC | PRN
  Administered 2011-12-23: 10 mL

## 2011-12-23 MED ORDER — METOPROLOL SUCCINATE ER 100 MG PO TB24: 100.0000 mg | ORAL_TABLET | Freq: Every day | ORAL | Status: DC

## 2011-12-23 MED ORDER — OXYCODONE-ACETAMINOPHEN 5-325 MG PO TABS: 1.0000 | ORAL_TABLET | ORAL | Status: DC | PRN

## 2011-12-23 MED ORDER — LIDOCAINE HCL (CARDIAC) 20 MG/ML IV SOLN
INTRAVENOUS | Status: DC | PRN
  Administered 2011-12-23: 60 mg via INTRAVENOUS

## 2011-12-23 MED ORDER — CEFAZOLIN SODIUM-DEXTROSE 2-3 GM-% IV SOLR
2.0000 g | INTRAVENOUS | Status: AC
  Administered 2011-12-23: 2 g via INTRAVENOUS

## 2011-12-23 MED ORDER — ONDANSETRON HCL 4 MG/2ML IJ SOLN
INTRAMUSCULAR | Status: DC | PRN
  Administered 2011-12-23: 4 mg via INTRAVENOUS

## 2011-12-23 MED ORDER — FENTANYL CITRATE 0.05 MG/ML IJ SOLN
INTRAMUSCULAR | Status: DC | PRN
  Administered 2011-12-23: 50 ug via INTRAVENOUS

## 2011-12-23 MED ORDER — FENTANYL CITRATE 0.05 MG/ML IJ SOLN: 25.0000 ug | INTRAMUSCULAR | Status: DC | PRN

## 2011-12-23 MED ORDER — PROPOFOL 10 MG/ML IV BOLUS
INTRAVENOUS | Status: DC | PRN
  Administered 2011-12-23: 200 mg via INTRAVENOUS

## 2011-12-23 MED ORDER — LACTATED RINGERS IV SOLN
INTRAVENOUS | Status: DC
  Administered 2011-12-23 (×2): via INTRAVENOUS

## 2011-12-23 MED ORDER — DEXAMETHASONE SODIUM PHOSPHATE 4 MG/ML IJ SOLN
INTRAMUSCULAR | Status: DC | PRN
  Administered 2011-12-23: 10 mg via INTRAVENOUS

## 2011-12-23 SURGICAL SUPPLY — 51 items
BAG DECANTER FOR FLEXI CONT (MISCELLANEOUS) ×2 IMPLANT
BENZOIN TINCTURE PRP APPL 2/3 (GAUZE/BANDAGES/DRESSINGS) IMPLANT
BLADE SURG 11 STRL SS (BLADE) ×2 IMPLANT
BLADE SURG 15 STRL LF DISP TIS (BLADE) ×1 IMPLANT
BLADE SURG 15 STRL SS (BLADE) ×1
CANISTER SUCTION 1200CC (MISCELLANEOUS) IMPLANT
CHLORAPREP W/TINT 26ML (MISCELLANEOUS) ×2 IMPLANT
CLOTH BEACON ORANGE TIMEOUT ST (SAFETY) ×2 IMPLANT
COVER MAYO STAND STRL (DRAPES) ×2 IMPLANT
COVER TABLE BACK 60X90 (DRAPES) ×2 IMPLANT
DECANTER SPIKE VIAL GLASS SM (MISCELLANEOUS) IMPLANT
DERMABOND ADVANCED (GAUZE/BANDAGES/DRESSINGS) ×1
DERMABOND ADVANCED .7 DNX12 (GAUZE/BANDAGES/DRESSINGS) ×1 IMPLANT
DRAPE C-ARM 42X72 X-RAY (DRAPES) ×2 IMPLANT
DRAPE LAPAROSCOPIC ABDOMINAL (DRAPES) ×2 IMPLANT
DRSG TEGADERM 4X4.75 (GAUZE/BANDAGES/DRESSINGS) IMPLANT
ELECT COATED BLADE 2.86 ST (ELECTRODE) ×2 IMPLANT
ELECT REM PT RETURN 9FT ADLT (ELECTROSURGICAL) ×2
ELECTRODE REM PT RTRN 9FT ADLT (ELECTROSURGICAL) ×1 IMPLANT
GAUZE SPONGE 4X4 12PLY STRL LF (GAUZE/BANDAGES/DRESSINGS) ×2 IMPLANT
GLOVE BIO SURGEON STRL SZ7 (GLOVE) ×2 IMPLANT
GLOVE BIOGEL PI IND STRL 7.0 (GLOVE) ×1 IMPLANT
GLOVE BIOGEL PI IND STRL 7.5 (GLOVE) ×1 IMPLANT
GLOVE BIOGEL PI INDICATOR 7.0 (GLOVE) ×1
GLOVE BIOGEL PI INDICATOR 7.5 (GLOVE) ×1
GLOVE ECLIPSE 7.0 STRL STRAW (GLOVE) ×2 IMPLANT
GOWN PREVENTION PLUS XLARGE (GOWN DISPOSABLE) ×4 IMPLANT
IV HEPARIN 1000UNITS/500ML (IV SOLUTION) ×2 IMPLANT
IV KIT MINILOC 20X1 SAFETY (NEEDLE) IMPLANT
KIT PORT POWER 8FR ISP CVUE (Catheter) ×2 IMPLANT
NDL SAFETY ECLIPSE 18X1.5 (NEEDLE) IMPLANT
NEEDLE HYPO 18GX1.5 SHARP (NEEDLE)
NEEDLE HYPO 25X1 1.5 SAFETY (NEEDLE) ×2 IMPLANT
PACK BASIN DAY SURGERY FS (CUSTOM PROCEDURE TRAY) ×2 IMPLANT
PENCIL BUTTON HOLSTER BLD 10FT (ELECTRODE) ×2 IMPLANT
SLEEVE SCD COMPRESS KNEE MED (MISCELLANEOUS) ×2 IMPLANT
STAPLER VISISTAT 35W (STAPLE) ×2 IMPLANT
STRIP CLOSURE SKIN 1/2X4 (GAUZE/BANDAGES/DRESSINGS) ×2 IMPLANT
SUT MON AB 4-0 PC3 18 (SUTURE) ×2 IMPLANT
SUT PROLENE 2 0 SH DA (SUTURE) ×2 IMPLANT
SUT SILK 2 0 TIES 17X18 (SUTURE)
SUT SILK 2-0 18XBRD TIE BLK (SUTURE) IMPLANT
SUT VIC AB 3-0 SH 27 (SUTURE) ×1
SUT VIC AB 3-0 SH 27X BRD (SUTURE) ×1 IMPLANT
SYR 5ML LUER SLIP (SYRINGE) ×2 IMPLANT
SYR CONTROL 10ML LL (SYRINGE) ×2 IMPLANT
TOWEL OR 17X24 6PK STRL BLUE (TOWEL DISPOSABLE) ×2 IMPLANT
TOWEL OR NON WOVEN STRL DISP B (DISPOSABLE) ×2 IMPLANT
TUBE CONNECTING 20X1/4 (TUBING) IMPLANT
WATER STERILE IRR 1000ML POUR (IV SOLUTION) ×2 IMPLANT
YANKAUER SUCT BULB TIP NO VENT (SUCTIONS) IMPLANT

## 2011-12-23 NOTE — Telephone Encounter (Signed)
error 

## 2011-12-23 NOTE — Telephone Encounter (Signed)
Pt calls today b/c she has recently learned she has breast cancer. She has an ECHO scheduled for this Friday and wanted Dr. [place] to be aware.  She will be taking Hercepton beginning 12/31/11. This is why her MD wanted the ECHO Friday.  B/c pt has so many upcoming appts, chemo etc she would like to schdule her yearly appt with Dr. [place] for March of 2014. I have scheduled that appt.  She has no refills on her metoprolol and I have sent that in for her as well. Reassurance given to pt and prayers for this upcoming eventful time in her life. Mylo Red RN

## 2011-12-23 NOTE — Telephone Encounter (Signed)
Pt calling to let dr [place] know "some things that are going on in her life", pls call

## 2011-12-23 NOTE — Anesthesia Procedure Notes (Signed)
Procedure Name: LMA Insertion Date/Time: 12/23/2011 10:08 AM Performed by: Aradhana Gin D Pre-anesthesia Checklist: Patient identified, Emergency Drugs available, Suction available and Patient being monitored Patient Re-evaluated:Patient Re-evaluated prior to inductionOxygen Delivery Method: Circle System Utilized Preoxygenation: Pre-oxygenation with 100% oxygen Intubation Type: IV induction Ventilation: Mask ventilation without difficulty LMA: LMA inserted LMA Size: 4.0 Number of attempts: 1 Airway Equipment and Method: bite block Placement Confirmation: positive ETCO2 Tube secured with: Tape Dental Injury: Teeth and Oropharynx as per pre-operative assessment

## 2011-12-23 NOTE — Interval H&P Note (Signed)
History and Physical Interval Note:  12/23/2011 9:49 AM  Marie Anderson  has presented today for surgery, with the diagnosis of breast cancer  The various methods of treatment have been discussed with the patient and family. After consideration of risks, benefits and other options for treatment, the patient has consented to  Procedure(s) (LRB) with comments: INSERTION PORT-A-CATH (N/A) as a surgical intervention .  The patient's history has been reviewed, patient examined, no change in status, stable for surgery.  I have reviewed the patient's chart and labs.  Questions were answered to the patient's satisfaction.     Marie Anderson

## 2011-12-23 NOTE — H&P (View-Only) (Signed)
Patient ID: Marie Anderson, female   DOB: 11/28/1945, 66 y.o.   MRN: [id number]  Chief Complaint  Patient presents with  . Other    breast cancer     HPI Marie Anderson is a 66 y.o. female.  Referred by Dr. John McComb HPI 66 yof who has felt palpable left breast mass for the last 2 weeks.  She has no prior breast history and no family history of breast or ovarian cancer.  She underwent mmg that an irregular mass with pleomorphic and linear calcifications.  Calcs span about 2 cm.  Ultrasound shows a 10x9x8mm irregular hypoechoic at 12 o'clock position 5 cm from nipple.  A 3 mm satellite nodule lies 3 mm superomedial to the dominant mass.  Ultrasound guided biopsy performed with clip placement.  This biopsy shows grade II invasive ductal carcinoma with DCIS.  This is er positive at 100, pr positive at 70, Ki67 is 94% and her2 neu is negative.  She has since undergone an mr that shows a 2.7x2.2x2 cm cancer with another mass in left breast at 3 o'clock measuring 1.1x1.1x1 cm tumor.  Path shows IDC.  Clips are 6.8 cm farthest apart on mmg.  She is very interested in an attempt at BCT and comes today to [facility] to discuss these options.  Past Medical History  Diagnosis Date  . Anemia   . SVT (supraventricular tachycardia)     Past Surgical History  Procedure Date  . Eye surgery     age 7    History reviewed. No pertinent family history.  Social History History  Substance Use Topics  . Smoking status: Never Smoker   . Smokeless tobacco: Never Used  . Alcohol Use: Yes    Allergies  Allergen Reactions  . Cephalosporins     headache  . Codeine Nausea Only    Current Outpatient Prescriptions  Medication Sig Dispense Refill  . Ascorbic Acid (VITAMIN C PO) Take 500 mg by mouth.       . CALCIUM PO Take 1,200 mg by mouth.       . Cholecalciferol (VITAMIN D PO) Take 500 mg by mouth daily.       . Cyanocobalamin (VITAMIN B 12 PO) Take 3,000 mg by mouth daily.       . ECHINACEA PO Take  800 mg by mouth daily.       . IRON PO Take 65 mg by mouth daily.       . metoprolol (TOPROL-XL) 100 MG 24 hr tablet Take 1 tablet (100 mg total) by mouth daily.  90 tablet  3    Review of Systems Review of Systems  Constitutional: Negative for fever, chills and unexpected weight change.  HENT: Negative for hearing loss, congestion, sore throat, trouble swallowing and voice change.   Eyes: Negative for visual disturbance.  Respiratory: Negative for cough and wheezing.   Cardiovascular: Negative for chest pain, palpitations and leg swelling.  Gastrointestinal: Negative for nausea, vomiting, abdominal pain, diarrhea, constipation, blood in stool, abdominal distention and anal bleeding.  Genitourinary: Negative for hematuria, vaginal bleeding and difficulty urinating.  Musculoskeletal: Negative for arthralgias.  Skin: Negative for rash and wound.  Neurological: Negative for seizures, syncope and headaches.  Hematological: Negative for adenopathy. Does not bruise/bleed easily.  Psychiatric/Behavioral: Negative for confusion.    There were no vitals taken for this visit.  Physical Exam Physical Exam  Vitals reviewed. Constitutional: She appears well-developed and well-nourished.  Neck: Neck supple.  Cardiovascular: Normal rate,   regular rhythm and normal heart sounds.   Pulmonary/Chest: Effort normal and breath sounds normal. She has no wheezes. She has no rales. Right breast exhibits no inverted nipple, no mass, no nipple discharge, no skin change and no tenderness. Left breast exhibits mass. Left breast exhibits no inverted nipple, no nipple discharge, no skin change and no tenderness. Breasts are symmetrical.    Lymphadenopathy:    She has no cervical adenopathy.    She has no axillary adenopathy.       Right: No supraclavicular adenopathy present.       Left: No supraclavicular adenopathy present.    Data Reviewed   BILATERAL BREAST MRI WITH AND WITHOUT CONTRAST   Technique:  Multiplanar, multisequence MR images of both breasts were obtained prior to and following the intravenous administration of 20ml of Multihance.  Three dimensional images were evaluated at the independent DynaCad workstation.   Comparison:  Prior mammograms and ultrasound   Findings: Post biopsy change is noted in the left breast 12 o'clock location as well as clip artifact corresponding to the biopsy- proven breast cancer.  No other abnormal T2-weighted hyperintensity on either side.  No lymphadenopathy.   Background parenchymal enhancement type pattern is mild.   In the left breast 12 o'clock location is an irregular spiculated enhancing mass with plateau type kinetics measuring 2.7 x 2.2 x 2.0 cm.  This corresponds to the biopsy-proven breast cancer.   In the left breast three o'clock location, there is a mildly irregular enhancing mass plateau type enhancing kinetics measuring 1.1 x 1.1 x 1.0 cm.   No dominant area of abnormal enhancement seen in the right breast.   IMPRESSION: Abnormal enhancing mass left breast 12 o'clock location at the site of biopsy-proven breast cancer.   A second abnormally enhancing mass in the left breast in the 3 o'clock location is identified; second look ultrasound is recommended for further evaluation and possible ultrasound-guided biopsy.  If no sonographic correlate is identified, MRI guided core biopsy would be recommended.   No evidence for malignancy in the right breast.   RECOMMENDATION: BI-RADS CATEGORY 4:  Suspicious abnormality - biopsy should be considered.   THREE-DIMENSIONAL MR IMAGE RENDERING ON INDEPENDENT WORKSTATION:   Three-dimensional MR images were rendered by post-processing of the original MR data on an independent workstation.  The three- dimensional MR images were interpreted, and findings were reported in the accompanying complete MRI report for this study.    Assessment    Clinical stage II left breast cancer,  multifocal    Plan    We had a long discussion about possible therapies.  I think she will likely need a mastectomy but this is basically her upper outer quadrant.  We discussed doing neoadjuvant therapy for this and we possibly could decrease the size of the larger and attempt to basically do a a quadrantectomy.  We discussed we would get mri at end of neoadjuvant and then decide.  I may very well recommend a mastectomy at that time.  She understands this.  I think this is really pushing the limits for breast conservation therapy. It is not unreasonable to give this a try though. She is a very good response stem we may be able to do this. She understands completely that she may very well and up with a mastectomy but that switching the order of therapy and giving her primary systemic therapy has no downside either. We discussed at length today the placement of a port for administration of chemotherapy.   We discussed the placement as well as the risks including but not limited to bleeding, infection, and pneumothorax. I will plan on putting her port in next week.       Laquiesha Piacente 12/16/2011, 12:34 PM    

## 2011-12-23 NOTE — Anesthesia Preprocedure Evaluation (Signed)
Anesthesia Evaluation  Patient identified by MRN, date of birth, ID band Patient awake    Reviewed: Allergy & Precautions, H&P , NPO status , Patient's Chart, lab work & pertinent test results  Airway Mallampati: II TM Distance: >3 FB Neck ROM: Full    Dental No notable dental hx. (+) Teeth Intact and Dental Advisory Given   Pulmonary neg pulmonary ROS,  breath sounds clear to auscultation  Pulmonary exam normal       Cardiovascular + dysrhythmias Supra Ventricular Tachycardia Rhythm:Regular Rate:Normal     Neuro/Psych negative neurological ROS  negative psych ROS   GI/Hepatic negative GI ROS, Neg liver ROS,   Endo/Other  negative endocrine ROS  Renal/GU negative Renal ROS  negative genitourinary   Musculoskeletal   Abdominal   Peds  Hematology negative hematology ROS (+)   Anesthesia Other Findings   Reproductive/Obstetrics negative OB ROS                           Anesthesia Physical Anesthesia Plan  ASA: II  Anesthesia Plan: General   Post-op Pain Management:    Induction: Intravenous  Airway Management Planned: LMA  Additional Equipment:   Intra-op Plan:   Post-operative Plan:   Informed Consent: I have reviewed the patients History and Physical, chart, labs and discussed the procedure including the risks, benefits and alternatives for the proposed anesthesia with the patient or authorized representative who has indicated his/her understanding and acceptance.   Dental advisory given  Plan Discussed with: CRNA  Anesthesia Plan Comments:         Anesthesia Quick Evaluation

## 2011-12-23 NOTE — Transfer of Care (Signed)
Immediate Anesthesia Transfer of Care Note  Patient: Marie Anderson  Procedure(s) Performed: Procedure(s) (LRB) with comments: INSERTION PORT-A-CATH (Right)  Patient Location: PACU  Anesthesia Type: General  Level of Consciousness: awake and patient cooperative  Airway & Oxygen Therapy: Patient Spontanous Breathing and Patient connected to face mask oxygen  Post-op Assessment: Report given to PACU RN and Post -op Vital signs reviewed and stable  Post vital signs: Reviewed and stable  Complications: No apparent anesthesia complications

## 2011-12-23 NOTE — Anesthesia Postprocedure Evaluation (Signed)
  Anesthesia Post-op Note  Patient: Marie Anderson  Procedure(s) Performed: Procedure(s) (LRB) with comments: INSERTION PORT-A-CATH (Right)  Patient Location: PACU  Anesthesia Type: General  Level of Consciousness: awake and alert   Airway and Oxygen Therapy: Patient Spontanous Breathing  Post-op Pain: mild  Post-op Assessment: Post-op Vital signs reviewed, Patient's Cardiovascular Status Stable, Respiratory Function Stable, Patent Airway and No signs of Nausea or vomiting  Post-op Vital Signs: Reviewed and stable  Complications: No apparent anesthesia complications

## 2011-12-23 NOTE — Op Note (Signed)
Preoperative diagnosis: Locally advanced HER-2/neu-positive left breast cancer Postoperative diagnosis: Same as above Procedure: Right subclavian ClearVue power port insertion Surgeon: Dr. Harden Mo Anesthesia: Gen. With LMA Complications: None Estimated blood loss: Minimal Drains: None Specimens: None Sponge needle count correct at end of operation Disposition to recovery stable  Indications: This is 66 year old female who had a palpable left breast mass. This underwent biopsy as a HER-2/neu-positive left breast cancer. She also had an MRI that showed another area in the same quadrant of the breast. We discussed all of her options and she would like to try to be able to undergo breast conservation therapy. We discussed possibly shrinking the larger mass did then possibly perform a lumpectomy. We discussed port placement to begin primary systemic chemotherapy with Herceptin.  Procedure: After informed consent was obtained the patient was taken the operating room. She was  given 2 g of intravenous cefazolin. Sequential compression devices were placed. She was then placed under general anesthesia with an LMA. An axillary roll was placed. Her arms were tucked appropriately padded. She was then prepped and draped in the standard sterile surgical fashion. Surgical timeout was performed.  I infiltrated Marcaine throughout the region in her right chest as well as onto her clavicle. I then accessed the subclavian vein on the first pass. The wire was placed and this was confirmed by fluoroscopy. I then made a pocket overlying the pectoralis fascia in  the right chest. I then tunneled the line between the insertion site in the pocket. I then dilated up the tract. I then placed the peel-away sheath under fluoroscopy. I removed the wire assembly. I then placed a line through this and removed the peel-away sheath. I pulled the line back to be at the cavoatrial junction. I then secured the line to the port. I  sutured the port into 3 positions with 2-0 Prolene suture. This aspirated blood and flushed easily. I packed this with heparin. I then closes with 3-0 Vicryl, 4 Monocryl, and Dermabond. Final fluoroscopy showed that the port was in good position with no kink in the line and the tip was in good position as well. She was then extubated and transferred to the recovery room stable.

## 2011-12-24 ENCOUNTER — Encounter (HOSPITAL_BASED_OUTPATIENT_CLINIC_OR_DEPARTMENT_OTHER): Payer: Self-pay | Admitting: General Surgery

## 2011-12-25 ENCOUNTER — Ambulatory Visit (HOSPITAL_COMMUNITY): Payer: BC Managed Care – PPO | Attending: Internal Medicine | Admitting: Radiology

## 2011-12-25 DIAGNOSIS — C50219 Malignant neoplasm of upper-inner quadrant of unspecified female breast: Secondary | ICD-10-CM

## 2011-12-25 DIAGNOSIS — I428 Other cardiomyopathies: Secondary | ICD-10-CM | POA: Insufficient documentation

## 2011-12-25 DIAGNOSIS — Z8249 Family history of ischemic heart disease and other diseases of the circulatory system: Secondary | ICD-10-CM | POA: Insufficient documentation

## 2011-12-25 DIAGNOSIS — I369 Nonrheumatic tricuspid valve disorder, unspecified: Secondary | ICD-10-CM | POA: Insufficient documentation

## 2011-12-25 DIAGNOSIS — C50919 Malignant neoplasm of unspecified site of unspecified female breast: Secondary | ICD-10-CM | POA: Insufficient documentation

## 2011-12-25 NOTE — Telephone Encounter (Signed)
Dr.Jordan was made aware of patient's recent diagnosis of breast cancer.

## 2011-12-25 NOTE — Progress Notes (Signed)
Echocardiogram performed.  

## 2011-12-28 ENCOUNTER — Telehealth: Payer: Self-pay | Admitting: *Deleted

## 2011-12-28 NOTE — Progress Notes (Signed)
Marie Anderson [id number] April 16, 1945 66 y.o. 12/28/2011 9:52 PM  CC  Juluis Mire, MD [facility] [address] [place] [address] Dr. Lurline Hare Dr. Emelia Loron Dr. Sigmund Hazel  REASON FOR CONSULTATION:  66 year old female with new diagnosis of invasive mammary carcinoma of the left breast at the 3:00 position patient's tumor is ER positive PR positive HER-2/neu positive with a Ki-67 of 94% Patient was seen in the Multidisciplinary Breast Clinic for discussion of her treatment options.   STAGE:   Cancer of upper-inner quadrant of female breast   Primary site: Breast (Left)   Staging method: AJCC 7th Edition   Clinical: Stage IIA (T2, N0, cM0)   Summary: Stage IIA (T2, N0, cM0)  REFERRING PHYSICIAN: Dr. Emelia Loron  HISTORY OF PRESENT ILLNESS:  Marie Anderson is a 66 y.o. female.  With medical history significant for SVT she is followed by Dr. [place].Approximately 2 weeks ago patient palpated a left breast mass. She went on to have a mammogram that revealed calcifications. She also had ultrasound that showed a 10 x 9 x 8 mm lesion with 3 mm satellite lesion. Biopsy of the primary lesion showed a grade 3 invasive ductal carcinoma that was ER positive PR positive HER-2/neu positive with a Ki-67 of 94%. MRI of the breasts showed irregular spiculated enhancing mass corresponding to the biopsy-proven area measuring 2.7 x 2.2 x 2 cm. At the 3:00 position a second irregular enhancing mass was also noted measuring 1.1 x 1.1 x 1.0 cm. An ultrasound-guided biopsy of this mass was performed and showed invasive ductal carcinoma with DCIS grade 2 but it was HER-2 negative. The 2 masses were about 6.8 cm apart. Her case was discussed in the multidisciplinary breast conference all of her pathology and radiology were reviewed. Patient is interested in breast conservation. She herself is without any complaints. She was seen in the  multidisciplinary breast clinic for discussion of her treatment options. Treatment recommendations are based on NCC N. Guidelines for stage IIa HER-2 positive breast cancer in a patient who desires breast conservation.   Past Medical History: Past Medical History  Diagnosis Date  . Anemia   . SVT (supraventricular tachycardia)     Past Surgical History: Past Surgical History  Procedure Date  . Dilation and curettage of uterus   . Eye surgery     age 60-lt eye growth  . Colonoscopy   . Portacath placement 12/23/2011    Procedure: INSERTION PORT-A-CATH;  Surgeon: Emelia Loron, MD;  Location: [facility];  Service: General;  Laterality: Right;    Family History: History reviewed. No pertinent family history.  Social History History  Substance Use Topics  . Smoking status: Never Smoker   . Smokeless tobacco: Never Used  . Alcohol Use: Yes    Allergies: Allergies  Allergen Reactions  . Cephalosporins     headache  . Codeine Nausea Only    Current Medications: Current Outpatient Prescriptions  Medication Sig Dispense Refill  . Ascorbic Acid (VITAMIN C PO) Take 500 mg by mouth.       Marland Kitchen CALCIUM PO Take 1,200 mg by mouth.       . Cholecalciferol (VITAMIN D PO) Take 500 mg by mouth daily.       . Cyanocobalamin (VITAMIN B 12 PO) Take 3,000 mg by mouth daily.       Marland Kitchen dexamethasone (DECADRON) 4 MG tablet Take 2 tabs two times a day the day  before Taxotere chemo. Then take 2 tabs daily starting the day after chemo for 2 days.  30 tablet  1  . ECHINACEA PO Take 800 mg by mouth daily.       . IRON PO Take 65 mg by mouth daily.       Marland Kitchen lidocaine-prilocaine (EMLA) cream Apply topically as needed. Apply to port as directed  30 g  6  . LORazepam (ATIVAN) 0.5 MG tablet Take 1 tablet (0.5 mg total) by mouth every 6 (six) hours as needed (Nausea or vomiting).  30 tablet  0  . metoprolol succinate (TOPROL-XL) 100 MG 24 hr tablet Take 1 tablet (100 mg total) by mouth  daily.  90 tablet  1  . ondansetron (ZOFRAN) 8 MG tablet Take 1 tablet two times a day for 2 days starting the day after chemo, then take two times a day as needed for nausea or vomiting.  30 tablet  1  . oxyCODONE-acetaminophen (ROXICET) 5-325 MG per tablet Take 1 tablet by mouth every 4 (four) hours as needed for pain.  10 tablet  0  . prochlorperazine (COMPAZINE) 10 MG tablet Take 1 tablet (10 mg total) by mouth every 6 (six) hours as needed (Nausea or vomiting).  30 tablet  1  . prochlorperazine (COMPAZINE) 25 MG suppository Place 1 suppository (25 mg total) rectally every 12 (twelve) hours as needed for nausea.  12 suppository  3    OB/GYN History:Menarche at age 35 menopause 2003 patient has never been on hormone replacement therapy patient is G3 P3 first live birth was at 76.  Fertility Discussion:Not applicable Prior History of Cancer:No prior history of malignancies  Health Maintenance:  Colonoscopy 2008 Bone Density 2012 Last PAP smear 2013 Patient's last normal mammogram was in 2011.  ECOG PERFORMANCE STATUS: 0 - Asymptomatic  Genetic Counseling/testing:Patient has no significant family history of malignancies therefore at this time genetic counseling and testing is not required or recommended  REVIEW OF SYSTEMS:  Patient did feel her mass otherwise she feels well she has no fatigue she denies any fevers chills night sweats weight changes she denies any double vision blurring of vision no sinus trouble no dental problems no painful swallowing no hoarseness no mouth sores no gum disease she denies any chest pains palpitations swelling of her feet she has no shortness of breath no cough hemoptysis. She has no nausea or vomiting heartburn liver disease changes in her bowel or bladder habits no urinary incontinence no kidney problems no rashes no easy bruising no back pain joint pain or arthritis no seizures no headaches no weakness no forgetfulness or numbness no anxiety depression  or phobia is no diabetes or thyroid or hot flashes no bleeding disorders. Remainder of the 14 point review of systems is negative.  PHYSICAL EXAMINATION: Blood pressure 160/92, pulse 76, temperature 98.3 F (36.8 C), resp. rate 20, height 5\' 5"  (1.651 m), weight 208 lb (94.348 kg). HEENT exam EOMI PERRLA sclerae anicteric no conjunctival pallor oral mucosa is moist neck is supple lungs are clear to auscultation and percussion cardiovascular is regular rate rhythm no murmurs gallops or rubs abdomen is soft nontender nondistended bowel sounds are present no HSM extremities no clubbing edema or cyanosis right breast no masses or nipple discharge. Left breast reveals 2 discreet masses one at the 3:00 position as well as a second mass which is larger it is tender to touch present at the 12:00 position.   STUDIES/RESULTS: US Breast Left  22-Dec-2011  *RADIOLOGY  REPORT*  Clinical Data:  Abnormal MRI, the second look left breast ultrasound  LEFT BREAST ULTRASOUND  Comparison:  MRI dated 12/11/2011  On physical exam, I palpate normal tissue in the 3 o'clock position of the left breast, 3 cm from the nipple.  Findings: Ultrasound is performed, showing an oval heterogeneously hypoechoic mass with microlobulated margins in the 3 o'clock position, 3 cm the nipple measuring 1.2 x 0.7 x 1.1 cm.  This corresponds to the area of concern on recent MRI.  IMPRESSION: Mass, left breast which biopsy is done and reported separately.  RECOMMENDATION: Ultrasound guided left breast biopsy.  BI-RADS CATEGORY 4:  Suspicious abnormality - biopsy should be considered.   Original Report Authenticated By: Hiram Gash, M.D.    US Breast Left  12/01/2011  *RADIOLOGY REPORT*  Clinical Data:  66 year old female with palpable lump in the upper left breast discovered on self examination.  Also for annual bilateral mammograms.  DIGITAL DIAGNOSTIC BILATERAL MAMMOGRAM WITH CAD AND LEFT BREAST ULTRASOUND:  Comparison:  03/18/2009  mammograms.  Findings:  A spot compression view of the left breast and routine views of both breasts again demonstrate heterogeneously dense breast tissue. Within the upper left breast, an irregular mass with pleomorphic and linear calcifications is identified.  These calcifications span a length of 2 cm. No other mass, distortion or suspicious calcifications are identified bilaterally. Mammographic images were processed with CAD.  On physical exam, a firm mass is identified at the 12 o'clock position of the left breast 5 cm from the nipple.  Ultrasound is performed, showing a 10 x 9 x 8 mm irregular hypoechoic mass at the 12 o'clock position of the left breast 5 cm from the nipple.  A 3 mm satellite nodule lies 3 mm superiomedial to the dominant mass. No definite enlarged abnormal appearing left axillary lymph nodes are identified.  IMPRESSION: 10 x 9 x 8 mm irregular mass in the upper left breast identified sonographically which likely represents the mammographic finding, as well as an adjacent 3 mm satellite nodule.  These findings are very worrisome for malignancy and tissue sampling is recommended.  These findings were discussed with the patient and her questions answered.  She was encouraged to begin/continue monthly self exams and to contact her primary physician if any changes noted.  BI-RADS CATEGORY 5:  Highly suggestive of malignancy - appropriate action should be taken.  RECOMMENDATION: Ultrasound guided left breast biopsy.  This has been scheduled for 12/07/2011 and the patient informed.   Original Report Authenticated By: Rosendo Gros, M.D.    Mr Breast Bilateral W Wo Contrast  12/11/2011  *RADIOLOGY REPORT*  Clinical Data: Recently biopsy-proven left breast invasive ductal carcinoma manifesting as a palpable left breast mass 12 o'clock location.  BILATERAL BREAST MRI WITH AND WITHOUT CONTRAST  Technique: Multiplanar, multisequence MR images of both breasts were obtained prior to and following the  intravenous administration of 20ml of Multihance.  Three dimensional images were evaluated at the independent DynaCad workstation.  Comparison:  Prior mammograms and ultrasound  Findings: Post biopsy change is noted in the left breast 12 o'clock location as well as clip artifact corresponding to the biopsy- proven breast cancer.  No other abnormal T2-weighted hyperintensity on either side.  No lymphadenopathy.  Background parenchymal enhancement type pattern is mild.  In the left breast 12 o'clock location is an irregular spiculated enhancing mass with plateau type kinetics measuring 2.7 x 2.2 x 2.0 cm.  This corresponds to the biopsy-proven breast cancer.  In the left breast three o'clock location, there is a mildly irregular enhancing mass plateau type enhancing kinetics measuring 1.1 x 1.1 x 1.0 cm.  No dominant area of abnormal enhancement seen in the right breast.  IMPRESSION: Abnormal enhancing mass left breast 12 o'clock location at the site of biopsy-proven breast cancer.  A second abnormally enhancing mass in the left breast in the 3 o'clock location is identified; second look ultrasound is recommended for further evaluation and possible ultrasound-guided biopsy.  If no sonographic correlate is identified, MRI guided core biopsy would be recommended.  No evidence for malignancy in the right breast.  RECOMMENDATION: BI-RADS CATEGORY 4:  Suspicious abnormality - biopsy should be considered.  THREE-DIMENSIONAL MR IMAGE RENDERING ON INDEPENDENT WORKSTATION:  Three-dimensional MR images were rendered by post-processing of the original MR data on an independent workstation.  The three- dimensional MR images were interpreted, and findings were reported in the accompanying complete MRI report for this study.  We will contact the patient to arrange for follow-up ultrasound.   Original Report Authenticated By: Harrel Lemon, M.D.    [place] Core Biopsy  12/17/2011  **ADDENDUM** CREATED: 12/15/2011 12:01:32   Histologic evaluation demonstrates invasive and in situ mammary carcinoma.  Grade II ductal carcinoma is favored.  This is concordant with the imaging findings.  Results were discussed with the patient by telephone at her request.  She reports no complications from the procedure.  She is scheduled to be seen in the [facility] on 12/16/2011.  **END ADDENDUM** SIGNED BY: Cain Saupe, M.D.   12/15/2011  **ADDENDUM** CREATED: 12/15/2011 12:01:32  Histologic evaluation demonstrates invasive and in situ mammary carcinoma.  Grade II ductal carcinoma is favored.  This is concordant with the imaging findings.  Results were discussed with the patient by telephone at her request.  She reports no complications from the procedure.  She is scheduled to be seen in the [facility] on 12/16/2011.  **END ADDENDUM** SIGNED BY: Cain Saupe, M.D.   12/14/2011  *RADIOLOGY REPORT*  Clinical Data:  New diagnosis left sided breast cancer.  Abnormal MRI.  ULTRASOUND GUIDED CORE BIOPSY OF THE left BREAST  Comparison: Previous exams.  I met with the patient and we discussed the procedure of ultrasound- guided biopsy, including benefits and alternatives.  We discussed the high likelihood of a successful procedure. We discussed the risks of the procedure, including infection, bleeding, tissue injury, clip migration, and inadequate sampling.  Informed written consent was given.  Using sterile technique 2% lidocaine, ultrasound guidance and a 14 gauge automated biopsy device, biopsy was performed of the mass using a  lateral approach.  At the conclusion of the procedure a wing tissue marker clip was deployed into the biopsy cavity. Follow up 2 view mammogram was performed and dictated separately.  IMPRESSION: Ultrasound guided biopsy of a left breast mass.  No apparent complications.  Original Report Authenticated By: Daryl Eastern, M.D.    [place] Core  Biopsy  12/04/2011  *RADIOLOGY REPORT*  Clinical Data:  Suspicious left breast mass 12 o'clock location  ULTRASOUND GUIDED VACUUM ASSISTED CORE BIOPSY OF THE LEFT BREAST  Comparison: Previous exams.  I met with the patient and we discussed the procedure of ultrasound- guided biopsy, including benefits and alternatives.  We discussed the high likelihood of a successful procedure. We discussed the risks of the procedure including infection, bleeding, tissue injury, clip migration, and inadequate sampling.  Informed written consent was given.  Using sterile technique, 2% lidocaine ultrasound guidance and a 12 gauge vacuum assisted needle biopsy was performed of left breast mass 12 o'clock location using a lateral to medial approach.  At the conclusion of the procedure, a ribbon shaped tissue marker clip was deployed into the biopsy cavity.  Follow-up 2-view mammogram was performed and dictated separately.  IMPRESSION:  Ultrasound-guided biopsy of left breast mass 12 o'clock location with ribbon shaped clip placement.  Pathology is pending.  No apparent complications.   Original Report Authenticated By: Harrel Lemon, M.D.    Dg Chest Port 1 View  12/23/2011  *RADIOLOGY REPORT*  Clinical Data: Postop for Port-A-Cath placement.  Rule out pneumothorax.  PORTABLE CHEST - 1 VIEW  Comparison: 11/03/2005  Findings: Right-sided Port-A-Cath terminates at the mid to low SVC. No pneumothorax.  There is biapical pleural thickening.  Midline trachea.  Normal heart size and mediastinal contours.  No pleural fluid. Clear lungs.  IMPRESSION: New right-sided Port-A-Cath, without pneumothorax.   Original Report Authenticated By: Consuello Bossier, M.D.    Mm Digital Diagnostic Bilat  12/01/2011  *RADIOLOGY REPORT*  Clinical Data:  66 year old female with palpable lump in the upper left breast discovered on self examination.  Also for annual bilateral mammograms.  DIGITAL DIAGNOSTIC BILATERAL MAMMOGRAM WITH CAD AND LEFT BREAST  ULTRASOUND:  Comparison:  03/18/2009 mammograms.  Findings:  A spot compression view of the left breast and routine views of both breasts again demonstrate heterogeneously dense breast tissue. Within the upper left breast, an irregular mass with pleomorphic and linear calcifications is identified.  These calcifications span a length of 2 cm. No other mass, distortion or suspicious calcifications are identified bilaterally. Mammographic images were processed with CAD.  On physical exam, a firm mass is identified at the 12 o'clock position of the left breast 5 cm from the nipple.  Ultrasound is performed, showing a 10 x 9 x 8 mm irregular hypoechoic mass at the 12 o'clock position of the left breast 5 cm from the nipple.  A 3 mm satellite nodule lies 3 mm superiomedial to the dominant mass. No definite enlarged abnormal appearing left axillary lymph nodes are identified.  IMPRESSION: 10 x 9 x 8 mm irregular mass in the upper left breast identified sonographically which likely represents the mammographic finding, as well as an adjacent 3 mm satellite nodule.  These findings are very worrisome for malignancy and tissue sampling is recommended.  These findings were discussed with the patient and her questions answered.  She was encouraged to begin/continue monthly self exams and to contact her primary physician if any changes noted.  BI-RADS CATEGORY 5:  Highly suggestive of malignancy - appropriate action should be taken.  RECOMMENDATION: Ultrasound guided left breast biopsy.  This has been scheduled for 12/07/2011 and the patient informed.   Original Report Authenticated By: Rosendo Gros, M.D.    Mm Digital Diagnostic Unilat L  12/14/2011  *RADIOLOGY REPORT*  Clinical Data:  Known malignancy, left breast.  Ultrasound core biopsy for an area seen on recent MRI.  DIGITAL DIAGNOSTIC LEFT MAMMOGRAM  Comparison:  Previous exams.  Findings:  Films are performed following ultrasound guided biopsy of a left breast mass.   Images show a wing type clip in association with the mass seen sonographically in the 3 o'clock position of the left breast.  The clip is 6.8 cm lateral and inferior to the known malignancy.  IMPRESSION: Adequate clip placement following ultrasound guided left breast biopsy.   Original Report Authenticated  By: Hiram Gash, M.D.    Mm Digital Diagnostic Unilat L  12/04/2011  *RADIOLOGY REPORT*  Clinical Data:  Ultrasound-guided left breast biopsy with clip placement  DIGITAL DIAGNOSTIC LEFT MAMMOGRAM  Comparison:  Previous exams.  Findings:  Films are performed following ultrasound guided biopsy of left breast mass 12 o'clock location.  The ribbon shaped clip is appropriately located at the site of the mammographically evident mass in the 12 o'clock location with associated calcifications.  IMPRESSION: Appropriate ribbon shaped clip location at the site of the biopsied left breast 12 o'clock location mass.   Original Report Authenticated By: Harrel Lemon, M.D.    Mm Radiologist Eval And Mgmt  12/07/2011  *RADIOLOGY REPORT*  Clinical Data: Post biopsy evaluation  CONSULTATION  Comparison: December 01, 2011, March 18, 2009  Findings: The pathology revealed invasive ductal carcinoma and ductal carcinoma ins situ.  This is found to be concordant with imaging findings.  I discussed the results with the the patient. The biopsy site is clean.  There is no complications.  The patient states she is doing well without problems post biopsy.  The patient has appointment for [facility] clinic on December 16, 2011.  The patient has appointment for MRI of the breasts on December 11, 2011.  IMPRESSION: Post biopsy consultation as described.  Recommend MRI of the breasts and and  [facility] as described.   Original Report Authenticated By: Sherian Rein, M.D.    Dg Fluoro Guide Cv Line-no Report  12/23/2011  CLINICAL DATA: port placement   FLOURO GUIDE CV LINE  Fluoroscopy was utilized by the requesting physician.  No  radiographic  interpretation.       LABS:    Chemistry      Component Value Date/Time   NA 143 12/23/2011 0947   NA 141 12/16/2011 0821   K 3.8 12/23/2011 0947   K 4.0 12/16/2011 0821   CL 106 12/23/2011 0947   CL 108* 12/16/2011 0821   CO2 24 12/16/2011 0821   BUN 13 12/23/2011 0947   BUN 10.0 12/16/2011 0821   CREATININE 0.70 12/23/2011 0947   CREATININE 0.8 12/16/2011 0821      Component Value Date/Time   CALCIUM 9.2 12/16/2011 0821   ALKPHOS 78 12/16/2011 0821   AST 20 12/16/2011 0821   ALT 19 12/16/2011 0821   BILITOT 0.60 12/16/2011 0821      Lab Results  Component Value Date   WBC 3.8* 12/16/2011   HGB 14.6 12/23/2011   HCT 43.0 12/23/2011   MCV 98.5 12/16/2011   PLT 80* 12/16/2011       PATHOLOGY: REASON FOR ADDENDUM, AMENDMENT OR CORRECTION: [id number]: E-cadherin result. ADDENDUM: An E-cadherin immunohistochemical stain is performed which is negative, confirming the lobular nature of the carcinoma. ADDITIONAL INFORMATION: CHROMOGENIC IN-SITU HYBRIDIZATION Interpretation HER-2/NEU BY CISH - NO AMPLIFICATION OF HER-2 DETECTED. THE RATIO OF HER-2: CEP 17 SIGNALS WAS 1.40. Reference range: Ratio: HER2:CEP17 < 1.8 - gene amplification not observed Ratio: HER2:CEP 17 1.8-2.2 - equivocal result Ratio: HER2:CEP17 > 2.2 - gene amplification observed Pecola Leisure MD Pathologist, Electronic Signature ( Signed 12/16/2011) PROGNOSTIC INDICATORS - ACIS Results IMMUNOHISTOCHEMICAL AND MORPHOMETRIC ANALYSIS BY THE AUTOMATED CELLULAR IMAGING SYSTEM (ACIS) Estrogen Receptor (Negative, <1%): 100%, STRONG STAINING INTENSITY Progesterone Receptor (Negative, <1%): 100%, STRONG STAINING INTENSITY Proliferation Marker Ki67 by M IB-1 (Low<20%): 19% 1 of  REASON FOR ADDENDUM, AMENDMENT OR CORRECTION: [id number]: E-cadherin result. ADDENDUM: An E-cadherin immunohistochemical stain is performed which is negative, confirming the lobular nature  of the  carcinoma. ADDITIONAL INFORMATION: CHROMOGENIC IN-SITU HYBRIDIZATION Interpretation HER-2/NEU BY CISH - NO AMPLIFICATION OF HER-2 DETECTED. THE RATIO OF HER-2: CEP 17 SIGNALS WAS 1.40. Reference range: Ratio: HER2:CEP17 < 1.8 - gene amplification not observed Ratio: HER2:CEP 17 1.8-2.2 - equivocal result Ratio: HER2:CEP17 > 2.2 - gene amplification observed Pecola Leisure MD Pathologist, Electronic Signature ( Signed 12/16/2011) PROGNOSTIC INDICATORS - ACIS Results IMMUNOHISTOCHEMICAL AND MORPHOMETRIC ANALYSIS BY THE AUTOMATED CELLULAR IMAGING SYSTEM (ACIS) Estrogen Receptor (Negative, <1%): 100%, STRONG STAINING INTENSITY Progesterone Receptor (Negative, <1%): 100%, STRONG STAINING INTENSITY Proliferation Marker Ki67 by M IB-1 (Low<20%): 19% 1 of ASSESSMENT    66 year old female with  #1 HER-2 positive stage II invasive ductal carcinoma with a second primary that is HER-2/neu negative. Both tumors are ER positive PR positive. Patient desires breast conservation. Therefore recommendation for neoadjuvant chemotherapy is recommended. However patient does also understand that in spite of neoadjuvant chemotherapy she may possibly still require mastectomy due to the location of the 2 masses. She would like to proceed with neoadjuvant treatment first. Recommendation of treatment with chemotherapy consisting of Taxotere and Herceptin as well as pertuzumab have been made risks and benefits of these medications have been completely explained to the patient.These recommendations are based on recent FDA approval of this combination for neoadjuvant approach.  #2 prior to getting her chemotherapy she will need a Port-A-Cath placed she will also need chemotherapy teaching class and an echocardiogram and ongoing cardiac monitoring I have discussed this with the patient.    PLAN:    #1 proceed with Port-A-Cath placement chemotherapy education class and echocardiogram.  #2 she will return in one week's  time for followup and further discussions about her chemotherapy.     Thank you so much for allowing me to participate in the care of Marie Anderson. I will continue to follow up the patient with you and assist in her care.  All questions were answered. The patient knows to call the clinic with any problems, questions or concerns. We can certainly see the patient much sooner if necessary.  I spent 60 minutes counseling the patient face to face. The total time spent in the appointment was 60 minutes.  Drue Second, MD Medical/Oncology [facility] [phone] (beeper) [phone] (Office)

## 2011-12-28 NOTE — Telephone Encounter (Signed)
Spoke to pt regarding  BMDC from 12/16/11.  Pt denies questions regarding dx or treatment care plan.  Confirmed appt and 1st chemo on 10/31 and neulasta injection on 11/1.  Discussed anti-nausea medications and time intervals.  Encourage pt to call with further needs.  Received verbal understanding.  Contact information given.

## 2011-12-29 ENCOUNTER — Ambulatory Visit: Payer: BC Managed Care – PPO | Admitting: Oncology

## 2011-12-29 ENCOUNTER — Other Ambulatory Visit: Payer: BC Managed Care – PPO | Admitting: Lab

## 2011-12-30 NOTE — Progress Notes (Signed)
OFFICE PROGRESS NOTE  CC  Marie Mire, MD [facility] [address] [place] [address] Dr. Sigmund Hazel Dr. Emelia Loron Dr. Lurline Hare  DIAGNOSIS: 66 year old female with invasive mammary carcinoma of the left breast ER positive PR positive HER-2/neu positive with Ki-67 94%.  PRIOR THERAPY:  #1 patient was originally seen in the multidisciplinary breast clinic on 12/16/2011. She was diagnosed with stage II a HER-2 positive ER/PR positive breast cancer of the left breast at the 3:00 position. She also had a second area that was ER/PR positive but HER-2/neu negative. Patient is interested in breast conservation.  #2 patient is planning on receiving neoadjuvant chemotherapy consisting of perjeta Herceptin and Taxotere given every 21 days.  CURRENT THERAPY: Neoadjuvant perjeta/herceptin/taxotere  INTERVAL HISTORY: Marie Anderson 66 y.o. female returns for followup visit. Overall she's doing well no new complaints. She has no pain. She denies any fevers chills night sweats headaches shortness of breath chest pains palpitations. Remainder of the 10 point review of systems is negative.  MEDICAL HISTORY: Past Medical History  Diagnosis Date  . Anemia   . SVT (supraventricular tachycardia)     ALLERGIES:  is allergic to cephalosporins and codeine.  MEDICATIONS:  Current Outpatient Prescriptions  Medication Sig Dispense Refill  . Ascorbic Acid (VITAMIN C PO) Take 500 mg by mouth.       Marland Kitchen CALCIUM PO Take 1,200 mg by mouth.       . Cholecalciferol (VITAMIN D PO) Take 500 mg by mouth daily.       . Cyanocobalamin (VITAMIN B 12 PO) Take 3,000 mg by mouth daily.       . IRON PO Take 65 mg by mouth daily.       Marland Kitchen dexamethasone (DECADRON) 4 MG tablet Take 2 tabs two times a day the day before Taxotere chemo. Then take 2 tabs daily starting the day after chemo for 2 days.  30 tablet  1  . ECHINACEA PO Take 800 mg by mouth daily.         Marland Kitchen lidocaine-prilocaine (EMLA) cream Apply topically as needed. Apply to port as directed  30 g  6  . LORazepam (ATIVAN) 0.5 MG tablet Take 1 tablet (0.5 mg total) by mouth every 6 (six) hours as needed (Nausea or vomiting).  30 tablet  0  . metoprolol succinate (TOPROL-XL) 100 MG 24 hr tablet Take 1 tablet (100 mg total) by mouth daily.  90 tablet  1  . ondansetron (ZOFRAN) 8 MG tablet Take 1 tablet two times a day for 2 days starting the day after chemo, then take two times a day as needed for nausea or vomiting.  30 tablet  1  . oxyCODONE-acetaminophen (ROXICET) 5-325 MG per tablet Take 1 tablet by mouth every 4 (four) hours as needed for pain.  10 tablet  0  . prochlorperazine (COMPAZINE) 10 MG tablet Take 1 tablet (10 mg total) by mouth every 6 (six) hours as needed (Nausea or vomiting).  30 tablet  1  . prochlorperazine (COMPAZINE) 25 MG suppository Place 1 suppository (25 mg total) rectally every 12 (twelve) hours as needed for nausea.  12 suppository  3    SURGICAL HISTORY:  Past Surgical History  Procedure Date  . Dilation and curettage of uterus   . Eye surgery     age 45-lt eye growth  . Colonoscopy   . Portacath placement 12/23/2011    Procedure: INSERTION PORT-A-CATH;  Surgeon: Molli Hazard  Dwain Sarna, MD;  Location: [facility];  Service: General;  Laterality: Right;    REVIEW OF SYSTEMS:  Pertinent items are noted in HPI.   HEALTH MAINTENANCE:  PHYSICAL EXAMINATION: Blood pressure 143/83, pulse 78, temperature 98.5 F (36.9 C), temperature source Oral, resp. rate 20, height 5\' 5"  (1.651 m), weight 206 lb 6.4 oz (93.622 kg). Body mass index is 34.35 kg/(m^2). ECOG PERFORMANCE STATUS: 0 - Asymptomatic   General appearance: alert, cooperative and appears stated age Lymph nodes: Cervical, supraclavicular, and axillary nodes normal. Resp: clear to auscultation bilaterally Back: symmetric, no curvature. ROM normal. No CVA tenderness. Cardio: regular rate and  rhythm GI: soft, non-tender; bowel sounds normal; no masses,  no organomegaly Extremities: extremities normal, atraumatic, no cyanosis or edema Neurologic: Grossly normal Left breast does reveal a palpable mass at the 3:00 position a second breast mass is noted at about the 10:00 position.   LABORATORY DATA: Lab Results  Component Value Date   WBC 3.8* 12/16/2011   HGB 14.6 12/23/2011   HCT 43.0 12/23/2011   MCV 98.5 12/16/2011   PLT 80* 12/16/2011      Chemistry      Component Value Date/Time   NA 143 12/23/2011 0947   NA 141 12/16/2011 0821   K 3.8 12/23/2011 0947   K 4.0 12/16/2011 0821   CL 106 12/23/2011 0947   CL 108* 12/16/2011 0821   CO2 24 12/16/2011 0821   BUN 13 12/23/2011 0947   BUN 10.0 12/16/2011 0821   CREATININE 0.70 12/23/2011 0947   CREATININE 0.8 12/16/2011 0821      Component Value Date/Time   CALCIUM 9.2 12/16/2011 0821   ALKPHOS 78 12/16/2011 0821   AST 20 12/16/2011 0821   ALT 19 12/16/2011 0821   BILITOT 0.60 12/16/2011 0821       RADIOGRAPHIC STUDIES:  US Breast Left  12/14/2011  *RADIOLOGY REPORT*  Clinical Data:  Abnormal MRI, the second look left breast ultrasound  LEFT BREAST ULTRASOUND  Comparison:  MRI dated 12/11/2011  On physical exam, I palpate normal tissue in the 3 o'clock position of the left breast, 3 cm from the nipple.  Findings: Ultrasound is performed, showing an oval heterogeneously hypoechoic mass with microlobulated margins in the 3 o'clock position, 3 cm the nipple measuring 1.2 x 0.7 x 1.1 cm.  This corresponds to the area of concern on recent MRI.  IMPRESSION: Mass, left breast which biopsy is done and reported separately.  RECOMMENDATION: Ultrasound guided left breast biopsy.  BI-RADS CATEGORY 4:  Suspicious abnormality - biopsy should be considered.   Original Report Authenticated By: Hiram Gash, M.D.    US Breast Left  12/01/2011  *RADIOLOGY REPORT*  Clinical Data:  66 year old female with palpable lump in the  upper left breast discovered on self examination.  Also for annual bilateral mammograms.  DIGITAL DIAGNOSTIC BILATERAL MAMMOGRAM WITH CAD AND LEFT BREAST ULTRASOUND:  Comparison:  03/18/2009 mammograms.  Findings:  A spot compression view of the left breast and routine views of both breasts again demonstrate heterogeneously dense breast tissue. Within the upper left breast, an irregular mass with pleomorphic and linear calcifications is identified.  These calcifications span a length of 2 cm. No other mass, distortion or suspicious calcifications are identified bilaterally. Mammographic images were processed with CAD.  On physical exam, a firm mass is identified at the 12 o'clock position of the left breast 5 cm from the nipple.  Ultrasound is performed, showing a 10 x 9 x  8 mm irregular hypoechoic mass at the 12 o'clock position of the left breast 5 cm from the nipple.  A 3 mm satellite nodule lies 3 mm superiomedial to the dominant mass. No definite enlarged abnormal appearing left axillary lymph nodes are identified.  IMPRESSION: 10 x 9 x 8 mm irregular mass in the upper left breast identified sonographically which likely represents the mammographic finding, as well as an adjacent 3 mm satellite nodule.  These findings are very worrisome for malignancy and tissue sampling is recommended.  These findings were discussed with the patient and her questions answered.  She was encouraged to begin/continue monthly self exams and to contact her primary physician if any changes noted.  BI-RADS CATEGORY 5:  Highly suggestive of malignancy - appropriate action should be taken.  RECOMMENDATION: Ultrasound guided left breast biopsy.  This has been scheduled for 12/07/2011 and the patient informed.   Original Report Authenticated By: Rosendo Gros, M.D.    Mr Breast Bilateral W Wo Contrast  12/11/2011  *RADIOLOGY REPORT*  Clinical Data: Recently biopsy-proven left breast invasive ductal carcinoma manifesting as a palpable  left breast mass 12 o'clock location.  BILATERAL BREAST MRI WITH AND WITHOUT CONTRAST  Technique: Multiplanar, multisequence MR images of both breasts were obtained prior to and following the intravenous administration of 20ml of Multihance.  Three dimensional images were evaluated at the independent DynaCad workstation.  Comparison:  Prior mammograms and ultrasound  Findings: Post biopsy change is noted in the left breast 12 o'clock location as well as clip artifact corresponding to the biopsy- proven breast cancer.  No other abnormal T2-weighted hyperintensity on either side.  No lymphadenopathy.  Background parenchymal enhancement type pattern is mild.  In the left breast 12 o'clock location is an irregular spiculated enhancing mass with plateau type kinetics measuring 2.7 x 2.2 x 2.0 cm.  This corresponds to the biopsy-proven breast cancer.  In the left breast three o'clock location, there is a mildly irregular enhancing mass plateau type enhancing kinetics measuring 1.1 x 1.1 x 1.0 cm.  No dominant area of abnormal enhancement seen in the right breast.  IMPRESSION: Abnormal enhancing mass left breast 12 o'clock location at the site of biopsy-proven breast cancer.  A second abnormally enhancing mass in the left breast in the 3 o'clock location is identified; second look ultrasound is recommended for further evaluation and possible ultrasound-guided biopsy.  If no sonographic correlate is identified, MRI guided core biopsy would be recommended.  No evidence for malignancy in the right breast.  RECOMMENDATION: BI-RADS CATEGORY 4:  Suspicious abnormality - biopsy should be considered.  THREE-DIMENSIONAL MR IMAGE RENDERING ON INDEPENDENT WORKSTATION:  Three-dimensional MR images were rendered by post-processing of the original MR data on an independent workstation.  The three- dimensional MR images were interpreted, and findings were reported in the accompanying complete MRI report for this study.  We will contact  the patient to arrange for follow-up ultrasound.   Original Report Authenticated By: Harrel Lemon, M.D.    [place] Core Biopsy  12/17/2011  **ADDENDUM** CREATED: 12/15/2011 12:01:32  Histologic evaluation demonstrates invasive and in situ mammary carcinoma.  Grade II ductal carcinoma is favored.  This is concordant with the imaging findings.  Results were discussed with the patient by telephone at her request.  She reports no complications from the procedure.  She is scheduled to be seen in the [facility] on 12/16/2011.  **END ADDENDUM** SIGNED BY: Cain Saupe, M.D.   12/15/2011  **  ADDENDUM** CREATED: 12/15/2011 12:01:32  Histologic evaluation demonstrates invasive and in situ mammary carcinoma.  Grade II ductal carcinoma is favored.  This is concordant with the imaging findings.  Results were discussed with the patient by telephone at her request.  She reports no complications from the procedure.  She is scheduled to be seen in the [facility] on 12/16/2011.  **END ADDENDUM** SIGNED BY: Cain Saupe, M.D.   12/14/2011  *RADIOLOGY REPORT*  Clinical Data:  New diagnosis left sided breast cancer.  Abnormal MRI.  ULTRASOUND GUIDED CORE BIOPSY OF THE left BREAST  Comparison: Previous exams.  I met with the patient and we discussed the procedure of ultrasound- guided biopsy, including benefits and alternatives.  We discussed the high likelihood of a successful procedure. We discussed the risks of the procedure, including infection, bleeding, tissue injury, clip migration, and inadequate sampling.  Informed written consent was given.  Using sterile technique 2% lidocaine, ultrasound guidance and a 14 gauge automated biopsy device, biopsy was performed of the mass using a  lateral approach.  At the conclusion of the procedure a wing tissue marker clip was deployed into the biopsy cavity. Follow up 2 view mammogram was performed and  dictated separately.  IMPRESSION: Ultrasound guided biopsy of a left breast mass.  No apparent complications.  Original Report Authenticated By: Daryl Eastern, M.D.    [place] Core Biopsy  12/04/2011  *RADIOLOGY REPORT*  Clinical Data:  Suspicious left breast mass 12 o'clock location  ULTRASOUND GUIDED VACUUM ASSISTED CORE BIOPSY OF THE LEFT BREAST  Comparison: Previous exams.  I met with the patient and we discussed the procedure of ultrasound- guided biopsy, including benefits and alternatives.  We discussed the high likelihood of a successful procedure. We discussed the risks of the procedure including infection, bleeding, tissue injury, clip migration, and inadequate sampling.  Informed written consent was given.  Using sterile technique, 2% lidocaine ultrasound guidance and a 12 gauge vacuum assisted needle biopsy was performed of left breast mass 12 o'clock location using a lateral to medial approach.  At the conclusion of the procedure, a ribbon shaped tissue marker clip was deployed into the biopsy cavity.  Follow-up 2-view mammogram was performed and dictated separately.  IMPRESSION:  Ultrasound-guided biopsy of left breast mass 12 o'clock location with ribbon shaped clip placement.  Pathology is pending.  No apparent complications.   Original Report Authenticated By: Harrel Lemon, M.D.    Dg Chest Port 1 View  12/23/2011  *RADIOLOGY REPORT*  Clinical Data: Postop for Port-A-Cath placement.  Rule out pneumothorax.  PORTABLE CHEST - 1 VIEW  Comparison: 11/03/2005  Findings: Right-sided Port-A-Cath terminates at the mid to low SVC. No pneumothorax.  There is biapical pleural thickening.  Midline trachea.  Normal heart size and mediastinal contours.  No pleural fluid. Clear lungs.  IMPRESSION: New right-sided Port-A-Cath, without pneumothorax.   Original Report Authenticated By: Consuello Bossier, M.D.    Mm Digital Diagnostic Bilat  12/01/2011  *RADIOLOGY REPORT*  Clinical Data:  66 year old  female with palpable lump in the upper left breast discovered on self examination.  Also for annual bilateral mammograms.  DIGITAL DIAGNOSTIC BILATERAL MAMMOGRAM WITH CAD AND LEFT BREAST ULTRASOUND:  Comparison:  03/18/2009 mammograms.  Findings:  A spot compression view of the left breast and routine views of both breasts again demonstrate heterogeneously dense breast tissue. Within the upper left breast, an irregular mass with pleomorphic and linear calcifications is identified.  These calcifications span a  length of 2 cm. No other mass, distortion or suspicious calcifications are identified bilaterally. Mammographic images were processed with CAD.  On physical exam, a firm mass is identified at the 12 o'clock position of the left breast 5 cm from the nipple.  Ultrasound is performed, showing a 10 x 9 x 8 mm irregular hypoechoic mass at the 12 o'clock position of the left breast 5 cm from the nipple.  A 3 mm satellite nodule lies 3 mm superiomedial to the dominant mass. No definite enlarged abnormal appearing left axillary lymph nodes are identified.  IMPRESSION: 10 x 9 x 8 mm irregular mass in the upper left breast identified sonographically which likely represents the mammographic finding, as well as an adjacent 3 mm satellite nodule.  These findings are very worrisome for malignancy and tissue sampling is recommended.  These findings were discussed with the patient and her questions answered.  She was encouraged to begin/continue monthly self exams and to contact her primary physician if any changes noted.  BI-RADS CATEGORY 5:  Highly suggestive of malignancy - appropriate action should be taken.  RECOMMENDATION: Ultrasound guided left breast biopsy.  This has been scheduled for 12/07/2011 and the patient informed.   Original Report Authenticated By: Rosendo Gros, M.D.    Mm Digital Diagnostic Unilat L  12/14/2011  *RADIOLOGY REPORT*  Clinical Data:  Known malignancy, left breast.  Ultrasound core biopsy  for an area seen on recent MRI.  DIGITAL DIAGNOSTIC LEFT MAMMOGRAM  Comparison:  Previous exams.  Findings:  Films are performed following ultrasound guided biopsy of a left breast mass.  Images show a wing type clip in association with the mass seen sonographically in the 3 o'clock position of the left breast.  The clip is 6.8 cm lateral and inferior to the known malignancy.  IMPRESSION: Adequate clip placement following ultrasound guided left breast biopsy.   Original Report Authenticated By: Hiram Gash, M.D.    Mm Digital Diagnostic Unilat L  12/04/2011  *RADIOLOGY REPORT*  Clinical Data:  Ultrasound-guided left breast biopsy with clip placement  DIGITAL DIAGNOSTIC LEFT MAMMOGRAM  Comparison:  Previous exams.  Findings:  Films are performed following ultrasound guided biopsy of left breast mass 12 o'clock location.  The ribbon shaped clip is appropriately located at the site of the mammographically evident mass in the 12 o'clock location with associated calcifications.  IMPRESSION: Appropriate ribbon shaped clip location at the site of the biopsied left breast 12 o'clock location mass.   Original Report Authenticated By: Harrel Lemon, M.D.    Mm Radiologist Eval And Mgmt  12/07/2011  *RADIOLOGY REPORT*  Clinical Data: Post biopsy evaluation  CONSULTATION  Comparison: December 01, 2011, March 18, 2009  Findings: The pathology revealed invasive ductal carcinoma and ductal carcinoma ins situ.  This is found to be concordant with imaging findings.  I discussed the results with the the patient. The biopsy site is clean.  There is no complications.  The patient states she is doing well without problems post biopsy.  The patient has appointment for [facility] clinic on December 16, 2011.  The patient has appointment for MRI of the breasts on December 11, 2011.  IMPRESSION: Post biopsy consultation as described.  Recommend MRI of the breasts and and  [facility] as described.   Original Report Authenticated By:  Sherian Rein, M.D.    Dg Fluoro Guide Cv Line-no Report  12/23/2011  CLINICAL DATA: port placement   FLOURO GUIDE CV LINE  Fluoroscopy was utilized  by the requesting physician.  No radiographic  interpretation.     ASSESSMENT: 66 year old female with new diagnosis of  #1 stage II invasive mammary carcinoma of the left breast that is ER positive PR positive HER-2/neu positive with Ki-67 94%. Patient is interested in breast conservation. There for she will begin neoadjuvant chemotherapy consisting of perjeta, Herceptin, and Taxotere. This will be given every 21 days for a total of 4 cycles. After that we will plan on getting MRI of the breasts performed to evaluate response.  #2 patient will get Port-A-Cath placement chemotherapy teaching class as well as an echocardiogram.  #3 she will continue to be seen by Dr. Peter [place] for on going cardiac surveillance and she is receiving Her2 based therapy.   PLAN:   #1 patient will proceed with neoadjuvant chemotherapy on 12/31/2011. She will receive day 2 Neulasta on 01/01/2012. Risks and benefits of treatment were discussed with the patient in detail.  #2 she will be seen back on 10/31 in followup prior to her chemotherapy   All questions were answered. The patient knows to call the clinic with any problems, questions or concerns. We can certainly see the patient much sooner if necessary.  I spent 25 minutes counseling the patient face to face. The total time spent in the appointment was 30 minutes.    Drue Second, MD Medical/Oncology [facility] [phone] (beeper) [phone] (Office)

## 2011-12-31 ENCOUNTER — Ambulatory Visit: Payer: BC Managed Care – PPO

## 2011-12-31 ENCOUNTER — Ambulatory Visit (HOSPITAL_BASED_OUTPATIENT_CLINIC_OR_DEPARTMENT_OTHER): Payer: BC Managed Care – PPO | Admitting: Adult Health

## 2011-12-31 ENCOUNTER — Ambulatory Visit (HOSPITAL_BASED_OUTPATIENT_CLINIC_OR_DEPARTMENT_OTHER): Payer: BC Managed Care – PPO

## 2011-12-31 ENCOUNTER — Encounter: Payer: Self-pay | Admitting: Adult Health

## 2011-12-31 ENCOUNTER — Other Ambulatory Visit (HOSPITAL_BASED_OUTPATIENT_CLINIC_OR_DEPARTMENT_OTHER): Payer: BC Managed Care – PPO | Admitting: Lab

## 2011-12-31 VITALS — BP 159/79 | HR 74 | Temp 97.5°F | Resp 20

## 2011-12-31 VITALS — BP 152/85 | HR 63 | Temp 98.1°F | Resp 20 | Ht 65.0 in | Wt 203.9 lb

## 2011-12-31 DIAGNOSIS — C50919 Malignant neoplasm of unspecified site of unspecified female breast: Secondary | ICD-10-CM

## 2011-12-31 DIAGNOSIS — Z170 Estrogen receptor positive status [ER+]: Secondary | ICD-10-CM

## 2011-12-31 DIAGNOSIS — C50219 Malignant neoplasm of upper-inner quadrant of unspecified female breast: Secondary | ICD-10-CM

## 2011-12-31 DIAGNOSIS — Z5112 Encounter for antineoplastic immunotherapy: Secondary | ICD-10-CM

## 2011-12-31 DIAGNOSIS — Z5111 Encounter for antineoplastic chemotherapy: Secondary | ICD-10-CM

## 2011-12-31 DIAGNOSIS — F411 Generalized anxiety disorder: Secondary | ICD-10-CM

## 2011-12-31 LAB — CBC WITH DIFFERENTIAL/PLATELET
BASO%: 0.1 % (ref 0.0–2.0)
EOS%: 0 % (ref 0.0–7.0)
MCH: 32.7 pg (ref 25.1–34.0)
MCHC: 34.4 g/dL (ref 31.5–36.0)
MCV: 95 fL (ref 79.5–101.0)
MONO%: 5.2 % (ref 0.0–14.0)
RBC: 4.43 10*6/uL (ref 3.70–5.45)
RDW: 13.5 % (ref 11.2–14.5)
lymph#: 0.9 10*3/uL (ref 0.9–3.3)

## 2011-12-31 LAB — COMPREHENSIVE METABOLIC PANEL (CC13)
AST: 13 U/L (ref 5–34)
Albumin: 4.2 g/dL (ref 3.5–5.0)
Alkaline Phosphatase: 75 U/L (ref 40–150)
Potassium: 3.6 mEq/L (ref 3.5–5.1)
Sodium: 142 mEq/L (ref 136–145)
Total Bilirubin: 0.58 mg/dL (ref 0.20–1.20)
Total Protein: 7.2 g/dL (ref 6.4–8.3)

## 2011-12-31 MED ORDER — ACETAMINOPHEN 325 MG PO TABS
650.0000 mg | ORAL_TABLET | Freq: Once | ORAL | Status: AC
  Administered 2011-12-31: 650 mg via ORAL

## 2011-12-31 MED ORDER — LORAZEPAM 2 MG/ML IJ SOLN
0.5000 mg | Freq: Once | INTRAMUSCULAR | Status: AC
  Administered 2011-12-31: 0.5 mg via INTRAVENOUS

## 2011-12-31 MED ORDER — DIPHENHYDRAMINE HCL 25 MG PO CAPS
50.0000 mg | ORAL_CAPSULE | Freq: Once | ORAL | Status: AC
  Administered 2011-12-31: 50 mg via ORAL

## 2011-12-31 MED ORDER — HEPARIN SOD (PORK) LOCK FLUSH 100 UNIT/ML IV SOLN
500.0000 [IU] | Freq: Once | INTRAVENOUS | Status: AC | PRN
  Administered 2011-12-31: 500 [IU]
  Filled 2011-12-31: qty 5

## 2011-12-31 MED ORDER — SODIUM CHLORIDE 0.9 % IV SOLN
Freq: Once | INTRAVENOUS | Status: AC
  Administered 2011-12-31: 13:00:00 via INTRAVENOUS

## 2011-12-31 MED ORDER — SODIUM CHLORIDE 0.9 % IV SOLN
840.0000 mg | Freq: Once | INTRAVENOUS | Status: AC
  Administered 2011-12-31: 840 mg via INTRAVENOUS
  Filled 2011-12-31: qty 28

## 2011-12-31 MED ORDER — DEXAMETHASONE SODIUM PHOSPHATE 10 MG/ML IJ SOLN
10.0000 mg | Freq: Once | INTRAMUSCULAR | Status: AC
  Administered 2011-12-31: 10 mg via INTRAVENOUS

## 2011-12-31 MED ORDER — SODIUM CHLORIDE 0.9 % IJ SOLN
10.0000 mL | INTRAMUSCULAR | Status: DC | PRN
  Administered 2011-12-31: 10 mL
  Filled 2011-12-31: qty 10

## 2011-12-31 MED ORDER — TRASTUZUMAB CHEMO INJECTION 440 MG
8.0000 mg/kg | Freq: Once | INTRAVENOUS | Status: AC
  Administered 2011-12-31: 756 mg via INTRAVENOUS
  Filled 2011-12-31: qty 36

## 2011-12-31 MED ORDER — DOCETAXEL CHEMO INJECTION 160 MG/16ML
75.0000 mg/m2 | Freq: Once | INTRAVENOUS | Status: AC
  Administered 2011-12-31: 160 mg via INTRAVENOUS
  Filled 2011-12-31: qty 16

## 2011-12-31 MED ORDER — ONDANSETRON 8 MG/50ML IVPB (CHCC)
8.0000 mg | Freq: Once | INTRAVENOUS | Status: AC
  Administered 2011-12-31: 8 mg via INTRAVENOUS

## 2011-12-31 NOTE — Patient Instructions (Addendum)
[facility] Discharge Instructions for Patients Receiving Chemotherapy  Today you received the following chemotherapy agents Taxotere, Herceptin and Perjeta.  To help prevent nausea and vomiting after your treatment, we encourage you to take your nausea medication as ordered per MD.    If you develop nausea and vomiting that is not controlled by your nausea medication, call the clinic. If it is after clinic hours your family physician or the after hours number for the clinic or go to the Emergency Department.   BELOW ARE SYMPTOMS THAT SHOULD BE REPORTED IMMEDIATELY:  *FEVER GREATER THAN 100.5 F  *CHILLS WITH OR WITHOUT FEVER  NAUSEA AND VOMITING THAT IS NOT CONTROLLED WITH YOUR NAUSEA MEDICATION  *UNUSUAL SHORTNESS OF BREATH  *UNUSUAL BRUISING OR BLEEDING  TENDERNESS IN MOUTH AND THROAT WITH OR WITHOUT PRESENCE OF ULCERS  *URINARY PROBLEMS  *BOWEL PROBLEMS  UNUSUAL RASH Items with * indicate a potential emergency and should be followed up as soon as possible.  One of the nurses will contact you 24 hours after your treatment. Please let the nurse know about any problems that you may have experienced. Feel free to call the clinic you have any questions or concerns. The clinic phone number is [phone].   I have been informed and understand all the instructions given to me. I know to contact the clinic, my physician, or go to the Emergency Department if any problems should occur. I do not have any questions at this time, but understand that I may call the clinic during office hours   should I have any questions or need assistance in obtaining follow up care.    __________________________________________  _____________  __________ Signature of Patient or Authorized Representative            Date                   Time    __________________________________________ Nurse's Signature

## 2011-12-31 NOTE — Patient Instructions (Signed)
Doing well.  Proceed with chemotherapy.   Take Claritin 10mg  today, tomorrow, and Saturday 11/2.    We will see you back next week for lab work.    Please call us if you have any questions or concerns.

## 2011-12-31 NOTE — Progress Notes (Signed)
OFFICE PROGRESS NOTE  CC  Juluis Mire, MD [facility] [address], [address] [place] [address] Dr. Sigmund Hazel Dr. Emelia Loron Dr. Lurline Hare  DIAGNOSIS: 66 year old female with invasive mammary carcinoma of the left breast ER positive PR positive HER-2/neu positive with Ki-67 94%.  PRIOR THERAPY:  #1 patient was originally seen in the multidisciplinary breast clinic on 12/16/2011. She was diagnosed with stage II a HER-2 positive ER/PR positive breast cancer of the left breast at the 3:00 position. She also had a second area that was ER/PR positive but HER-2/neu negative. Patient is interested in breast conservation.  #2 patient is planning on receiving neoadjuvant chemotherapy consisting of perjeta Herceptin and Taxotere given every 21 days.  CURRENT THERAPY: Neoadjuvant perjeta/herceptin/taxotere Cycle 1 Day 1  INTERVAL HISTORY: THANH POMERLEAU 66 y.o. female returns for followup visit. Today she's starting her treatment with Perjeta/Herceptin/Taxotere.  She's nervous about the treatment.  She's been feeling well and w/o complaints.    MEDICAL HISTORY: Past Medical History  Diagnosis Date  . Anemia   . SVT (supraventricular tachycardia)     ALLERGIES:  is allergic to cephalosporins and codeine.  MEDICATIONS:  Current Outpatient Prescriptions  Medication Sig Dispense Refill  . Ascorbic Acid (VITAMIN C PO) Take 500 mg by mouth.       Marland Kitchen CALCIUM PO Take 1,200 mg by mouth.       . Cholecalciferol (VITAMIN D PO) Take 500 mg by mouth daily.       . Cyanocobalamin (VITAMIN B 12 PO) Take 3,000 mg by mouth daily.       Marland Kitchen dexamethasone (DECADRON) 4 MG tablet Take 2 tabs two times a day the day before Taxotere chemo. Then take 2 tabs daily starting the day after chemo for 2 days.  30 tablet  1  . ECHINACEA PO Take 800 mg by mouth daily.       . IRON PO Take 65 mg by mouth daily.       Marland Kitchen lidocaine-prilocaine (EMLA) cream Apply  topically as needed. Apply to port as directed  30 g  6  . LORazepam (ATIVAN) 0.5 MG tablet Take 1 tablet (0.5 mg total) by mouth every 6 (six) hours as needed (Nausea or vomiting).  30 tablet  0  . metoprolol succinate (TOPROL-XL) 100 MG 24 hr tablet Take 1 tablet (100 mg total) by mouth daily.  90 tablet  1  . ondansetron (ZOFRAN) 8 MG tablet Take 1 tablet two times a day for 2 days starting the day after chemo, then take two times a day as needed for nausea or vomiting.  30 tablet  1  . oxyCODONE-acetaminophen (ROXICET) 5-325 MG per tablet Take 1 tablet by mouth every 4 (four) hours as needed for pain.  10 tablet  0  . prochlorperazine (COMPAZINE) 10 MG tablet Take 1 tablet (10 mg total) by mouth every 6 (six) hours as needed (Nausea or vomiting).  30 tablet  1  . prochlorperazine (COMPAZINE) 25 MG suppository Place 1 suppository (25 mg total) rectally every 12 (twelve) hours as needed for nausea.  12 suppository  3    SURGICAL HISTORY:  Past Surgical History  Procedure Date  . Dilation and curettage of uterus   . Eye surgery     age 60-lt eye growth  . Colonoscopy   . Portacath placement 12/23/2011    Procedure: INSERTION PORT-A-CATH;  Surgeon: Emelia Loron, MD;  Location: [facility];  Service: General;  Laterality: Right;    REVIEW OF SYSTEMS:   General: fatigue (-), night sweats (-), fever (-), pain (-) Lymph: palpable nodes (-) HEENT: vision changes (-), mucositis (-), gum bleeding (-), epistaxis (-) Cardiovascular: chest pain (-), palpitations (-) Pulmonary: shortness of breath (-), dyspnea on exertion (-), cough (-), hemoptysis (-) GI:  Early satiety (-), melena (-), dysphagia (-), nausea/vomiting (-), diarrhea (-) GU: dysuria (-), hematuria (-), incontinence (-) Musculoskeletal: joint swelling (-), joint pain (-), back pain (-) Neuro: weakness (-), numbness (-), headache (-), confusion (-) Skin: Rash (-), lesions (-), dryness (-) Psych: depression (-),  suicidal/homicidal ideation (-), feeling of hopelessness (-)   PHYSICAL EXAMINATION: Blood pressure 152/85, pulse 63, temperature 98.1 F (36.7 C), temperature source Oral, resp. rate 20, height 5\' 5"  (1.651 m), weight 203 lb 14.4 oz (92.488 kg). Body mass index is 33.93 kg/(m^2). General: Patient is a well appearing female in no acute distress HEENT: PERRLA, sclerae anicteric no conjunctival pallor, MMM Neck: supple, no palpable adenopathy Lungs: clear to auscultation bilaterally, no wheezes, rhonchi, or rales Cardiovascular: regular rate rhythm, S1, S2, no murmurs, rubs or gallops Abdomen: Soft, non-tender, non-distended, normoactive bowel sounds, no HSM Extremities: warm and well perfused, no clubbing, cyanosis, or edema Skin: No rashes or lesions Neuro: Non-focal Left breast does reveal a palpable mass at the 3:00 position a second breast mass is noted at about the 10:00 position. ECOG PERFORMANCE STATUS: 0 - Asymptomatic  LABORATORY DATA: Lab Results  Component Value Date   WBC 8.9 12/31/2011   HGB 14.5 12/31/2011   HCT 42.1 12/31/2011   MCV 95.0 12/31/2011   PLT 103* 12/31/2011      Chemistry      Component Value Date/Time   NA 143 12/23/2011 0947   NA 141 12/16/2011 0821   K 3.8 12/23/2011 0947   K 4.0 12/16/2011 0821   CL 106 12/23/2011 0947   CL 108* 12/16/2011 0821   CO2 24 12/16/2011 0821   BUN 13 12/23/2011 0947   BUN 10.0 12/16/2011 0821   CREATININE 0.70 12/23/2011 0947   CREATININE 0.8 12/16/2011 0821      Component Value Date/Time   CALCIUM 9.2 12/16/2011 0821   ALKPHOS 78 12/16/2011 0821   AST 20 12/16/2011 0821   ALT 19 12/16/2011 0821   BILITOT 0.60 12/16/2011 0821       RADIOGRAPHIC STUDIES:  US Breast Left  12/14/2011  *RADIOLOGY REPORT*  Clinical Data:  Abnormal MRI, the second look left breast ultrasound  LEFT BREAST ULTRASOUND  Comparison:  MRI dated 12/11/2011  On physical exam, I palpate normal tissue in the 3 o'clock position of the  left breast, 3 cm from the nipple.  Findings: Ultrasound is performed, showing an oval heterogeneously hypoechoic mass with microlobulated margins in the 3 o'clock position, 3 cm the nipple measuring 1.2 x 0.7 x 1.1 cm.  This corresponds to the area of concern on recent MRI.  IMPRESSION: Mass, left breast which biopsy is done and reported separately.  RECOMMENDATION: Ultrasound guided left breast biopsy.  BI-RADS CATEGORY 4:  Suspicious abnormality - biopsy should be considered.   Original Report Authenticated By: Hiram Gash, M.D.    US Breast Left  12/01/2011  *RADIOLOGY REPORT*  Clinical Data:  66 year old female with palpable lump in the upper left breast discovered on self examination.  Also for annual bilateral mammograms.  DIGITAL DIAGNOSTIC BILATERAL MAMMOGRAM WITH CAD AND LEFT BREAST ULTRASOUND:  Comparison:  03/18/2009 mammograms.  Findings:  A spot compression view of the left breast and routine views of both breasts again demonstrate heterogeneously dense breast tissue. Within the upper left breast, an irregular mass with pleomorphic and linear calcifications is identified.  These calcifications span a length of 2 cm. No other mass, distortion or suspicious calcifications are identified bilaterally. Mammographic images were processed with CAD.  On physical exam, a firm mass is identified at the 12 o'clock position of the left breast 5 cm from the nipple.  Ultrasound is performed, showing a 10 x 9 x 8 mm irregular hypoechoic mass at the 12 o'clock position of the left breast 5 cm from the nipple.  A 3 mm satellite nodule lies 3 mm superiomedial to the dominant mass. No definite enlarged abnormal appearing left axillary lymph nodes are identified.  IMPRESSION: 10 x 9 x 8 mm irregular mass in the upper left breast identified sonographically which likely represents the mammographic finding, as well as an adjacent 3 mm satellite nodule.  These findings are very worrisome for malignancy and tissue  sampling is recommended.  These findings were discussed with the patient and her questions answered.  She was encouraged to begin/continue monthly self exams and to contact her primary physician if any changes noted.  BI-RADS CATEGORY 5:  Highly suggestive of malignancy - appropriate action should be taken.  RECOMMENDATION: Ultrasound guided left breast biopsy.  This has been scheduled for 12/07/2011 and the patient informed.   Original Report Authenticated By: Rosendo Gros, M.D.    Mr Breast Bilateral W Wo Contrast  12/11/2011  *RADIOLOGY REPORT*  Clinical Data: Recently biopsy-proven left breast invasive ductal carcinoma manifesting as a palpable left breast mass 12 o'clock location.  BILATERAL BREAST MRI WITH AND WITHOUT CONTRAST  Technique: Multiplanar, multisequence MR images of both breasts were obtained prior to and following the intravenous administration of 20ml of Multihance.  Three dimensional images were evaluated at the independent DynaCad workstation.  Comparison:  Prior mammograms and ultrasound  Findings: Post biopsy change is noted in the left breast 12 o'clock location as well as clip artifact corresponding to the biopsy- proven breast cancer.  No other abnormal T2-weighted hyperintensity on either side.  No lymphadenopathy.  Background parenchymal enhancement type pattern is mild.  In the left breast 12 o'clock location is an irregular spiculated enhancing mass with plateau type kinetics measuring 2.7 x 2.2 x 2.0 cm.  This corresponds to the biopsy-proven breast cancer.  In the left breast three o'clock location, there is a mildly irregular enhancing mass plateau type enhancing kinetics measuring 1.1 x 1.1 x 1.0 cm.  No dominant area of abnormal enhancement seen in the right breast.  IMPRESSION: Abnormal enhancing mass left breast 12 o'clock location at the site of biopsy-proven breast cancer.  A second abnormally enhancing mass in the left breast in the 3 o'clock location is identified;  second look ultrasound is recommended for further evaluation and possible ultrasound-guided biopsy.  If no sonographic correlate is identified, MRI guided core biopsy would be recommended.  No evidence for malignancy in the right breast.  RECOMMENDATION: BI-RADS CATEGORY 4:  Suspicious abnormality - biopsy should be considered.  THREE-DIMENSIONAL MR IMAGE RENDERING ON INDEPENDENT WORKSTATION:  Three-dimensional MR images were rendered by post-processing of the original MR data on an independent workstation.  The three- dimensional MR images were interpreted, and findings were reported in the accompanying complete MRI report for this study.  We will contact the patient to arrange for follow-up ultrasound.   Original Report  Authenticated By: Harrel Lemon, M.D.    [place] Core Biopsy  12/17/2011  **ADDENDUM** CREATED: 12/15/2011 12:01:32  Histologic evaluation demonstrates invasive and in situ mammary carcinoma.  Grade II ductal carcinoma is favored.  This is concordant with the imaging findings.  Results were discussed with the patient by telephone at her request.  She reports no complications from the procedure.  She is scheduled to be seen in the [facility] on 12/16/2011.  **END ADDENDUM** SIGNED BY: Cain Saupe, M.D.   12/15/2011  **ADDENDUM** CREATED: 12/15/2011 12:01:32  Histologic evaluation demonstrates invasive and in situ mammary carcinoma.  Grade II ductal carcinoma is favored.  This is concordant with the imaging findings.  Results were discussed with the patient by telephone at her request.  She reports no complications from the procedure.  She is scheduled to be seen in the [facility] on 12/16/2011.  **END ADDENDUM** SIGNED BY: Cain Saupe, M.D.   12/14/2011  *RADIOLOGY REPORT*  Clinical Data:  New diagnosis left sided breast cancer.  Abnormal MRI.  ULTRASOUND GUIDED CORE BIOPSY OF THE left BREAST  Comparison: Previous  exams.  I met with the patient and we discussed the procedure of ultrasound- guided biopsy, including benefits and alternatives.  We discussed the high likelihood of a successful procedure. We discussed the risks of the procedure, including infection, bleeding, tissue injury, clip migration, and inadequate sampling.  Informed written consent was given.  Using sterile technique 2% lidocaine, ultrasound guidance and a 14 gauge automated biopsy device, biopsy was performed of the mass using a  lateral approach.  At the conclusion of the procedure a wing tissue marker clip was deployed into the biopsy cavity. Follow up 2 view mammogram was performed and dictated separately.  IMPRESSION: Ultrasound guided biopsy of a left breast mass.  No apparent complications.  Original Report Authenticated By: Daryl Eastern, M.D.    [place] Core Biopsy  12/04/2011  *RADIOLOGY REPORT*  Clinical Data:  Suspicious left breast mass 12 o'clock location  ULTRASOUND GUIDED VACUUM ASSISTED CORE BIOPSY OF THE LEFT BREAST  Comparison: Previous exams.  I met with the patient and we discussed the procedure of ultrasound- guided biopsy, including benefits and alternatives.  We discussed the high likelihood of a successful procedure. We discussed the risks of the procedure including infection, bleeding, tissue injury, clip migration, and inadequate sampling.  Informed written consent was given.  Using sterile technique, 2% lidocaine ultrasound guidance and a 12 gauge vacuum assisted needle biopsy was performed of left breast mass 12 o'clock location using a lateral to medial approach.  At the conclusion of the procedure, a ribbon shaped tissue marker clip was deployed into the biopsy cavity.  Follow-up 2-view mammogram was performed and dictated separately.  IMPRESSION:  Ultrasound-guided biopsy of left breast mass 12 o'clock location with ribbon shaped clip placement.  Pathology is pending.  No apparent complications.   Original Report  Authenticated By: Harrel Lemon, M.D.    Dg Chest Port 1 View  12/23/2011  *RADIOLOGY REPORT*  Clinical Data: Postop for Port-A-Cath placement.  Rule out pneumothorax.  PORTABLE CHEST - 1 VIEW  Comparison: 11/03/2005  Findings: Right-sided Port-A-Cath terminates at the mid to low SVC. No pneumothorax.  There is biapical pleural thickening.  Midline trachea.  Normal heart size and mediastinal contours.  No pleural fluid. Clear lungs.  IMPRESSION: New right-sided Port-A-Cath, without pneumothorax.   Original Report Authenticated By: Consuello Bossier, M.D.  Mm Digital Diagnostic Bilat  12/01/2011  *RADIOLOGY REPORT*  Clinical Data:  65 year old female with palpable lump in the upper left breast discovered on self examination.  Also for annual bilateral mammograms.  DIGITAL DIAGNOSTIC BILATERAL MAMMOGRAM WITH CAD AND LEFT BREAST ULTRASOUND:  Comparison:  03/18/2009 mammograms.  Findings:  A spot compression view of the left breast and routine views of both breasts again demonstrate heterogeneously dense breast tissue. Within the upper left breast, an irregular mass with pleomorphic and linear calcifications is identified.  These calcifications span a length of 2 cm. No other mass, distortion or suspicious calcifications are identified bilaterally. Mammographic images were processed with CAD.  On physical exam, a firm mass is identified at the 12 o'clock position of the left breast 5 cm from the nipple.  Ultrasound is performed, showing a 10 x 9 x 8 mm irregular hypoechoic mass at the 12 o'clock position of the left breast 5 cm from the nipple.  A 3 mm satellite nodule lies 3 mm superiomedial to the dominant mass. No definite enlarged abnormal appearing left axillary lymph nodes are identified.  IMPRESSION: 10 x 9 x 8 mm irregular mass in the upper left breast identified sonographically which likely represents the mammographic finding, as well as an adjacent 3 mm satellite nodule.  These findings are very  worrisome for malignancy and tissue sampling is recommended.  These findings were discussed with the patient and her questions answered.  She was encouraged to begin/continue monthly self exams and to contact her primary physician if any changes noted.  BI-RADS CATEGORY 5:  Highly suggestive of malignancy - appropriate action should be taken.  RECOMMENDATION: Ultrasound guided left breast biopsy.  This has been scheduled for 12/07/2011 and the patient informed.   Original Report Authenticated By: Rosendo Gros, M.D.    Mm Digital Diagnostic Unilat L  12/14/2011  *RADIOLOGY REPORT*  Clinical Data:  Known malignancy, left breast.  Ultrasound core biopsy for an area seen on recent MRI.  DIGITAL DIAGNOSTIC LEFT MAMMOGRAM  Comparison:  Previous exams.  Findings:  Films are performed following ultrasound guided biopsy of a left breast mass.  Images show a wing type clip in association with the mass seen sonographically in the 3 o'clock position of the left breast.  The clip is 6.8 cm lateral and inferior to the known malignancy.  IMPRESSION: Adequate clip placement following ultrasound guided left breast biopsy.   Original Report Authenticated By: Hiram Gash, M.D.    Mm Digital Diagnostic Unilat L  12/04/2011  *RADIOLOGY REPORT*  Clinical Data:  Ultrasound-guided left breast biopsy with clip placement  DIGITAL DIAGNOSTIC LEFT MAMMOGRAM  Comparison:  Previous exams.  Findings:  Films are performed following ultrasound guided biopsy of left breast mass 12 o'clock location.  The ribbon shaped clip is appropriately located at the site of the mammographically evident mass in the 12 o'clock location with associated calcifications.  IMPRESSION: Appropriate ribbon shaped clip location at the site of the biopsied left breast 12 o'clock location mass.   Original Report Authenticated By: Harrel Lemon, M.D.    Mm Radiologist Eval And Mgmt  12/07/2011  *RADIOLOGY REPORT*  Clinical Data: Post biopsy evaluation   CONSULTATION  Comparison: December 01, 2011, March 18, 2009  Findings: The pathology revealed invasive ductal carcinoma and ductal carcinoma ins situ.  This is found to be concordant with imaging findings.  I discussed the results with the the patient. The biopsy site is clean.  There is no complications.  The patient states she is doing well without problems post biopsy.  The patient has appointment for [facility] clinic on December 16, 2011.  The patient has appointment for MRI of the breasts on December 11, 2011.  IMPRESSION: Post biopsy consultation as described.  Recommend MRI of the breasts and and  [facility] as described.   Original Report Authenticated By: Sherian Rein, M.D.    Dg Fluoro Guide Cv Line-no Report  12/23/2011  CLINICAL DATA: port placement   FLOURO GUIDE CV LINE  Fluoroscopy was utilized by the requesting physician.  No radiographic  interpretation.     ASSESSMENT: 66 year old female with new diagnosis of  #1 stage II invasive mammary carcinoma of the left breast that is ER positive PR positive HER-2/neu positive with Ki-67 94%. Patient is interested in breast conservation. There for she will begin neoadjuvant chemotherapy consisting of perjeta, Herceptin, and Taxotere. This will be given every 21 days for a total of 4 cycles. After that we will plan on getting MRI of the breasts performed to evaluate response.  #2 patient will get Port-A-Cath placement chemotherapy teaching class as well as an echocardiogram.  #3 she will continue to be seen by Dr. Peter [place] for on going cardiac surveillance and she is receiving Her2 based therapy.   PLAN:   #1Ms. Eakes will proceed with chemotehrapy today.  She is very nervous about her treatment and on the verge of becoming nauseated, so I ordered her some Ativan as a premed for her treatment.  She will come back tomorrow for Neulasta.  I gave her instructions about taking Claritin and Tylenol to help alleviate any bony pain she might have as a  result of the injection.    #2 She will return next week for a lab check.     All questions were answered. The patient knows to call the clinic with any problems, questions or concerns. We can certainly see the patient much sooner if necessary.  I spent 25 minutes counseling the patient face to face. The total time spent in the appointment was 30 minutes.  This case was reviewed with Dr. Welton Flakes.   Cherie Ouch Lyn Hollingshead, NP Medical Oncology [facility] Phone: [phone]

## 2012-01-01 ENCOUNTER — Telehealth: Payer: Self-pay | Admitting: *Deleted

## 2012-01-01 ENCOUNTER — Ambulatory Visit (HOSPITAL_BASED_OUTPATIENT_CLINIC_OR_DEPARTMENT_OTHER): Payer: BC Managed Care – PPO

## 2012-01-01 VITALS — BP 137/82 | HR 77 | Temp 98.8°F

## 2012-01-01 DIAGNOSIS — C50219 Malignant neoplasm of upper-inner quadrant of unspecified female breast: Secondary | ICD-10-CM

## 2012-01-01 DIAGNOSIS — Z5189 Encounter for other specified aftercare: Secondary | ICD-10-CM

## 2012-01-01 MED ORDER — PEGFILGRASTIM INJECTION 6 MG/0.6ML
6.0000 mg | Freq: Once | SUBCUTANEOUS | Status: AC
  Administered 2012-01-01: 6 mg via SUBCUTANEOUS
  Filled 2012-01-01: qty 0.6

## 2012-01-01 NOTE — Telephone Encounter (Signed)
Patient says she "is feeling bitchy.  I know it's the steroids.  My face hurts, I have a headache and by 11:00 this morning I took an anti-emetic."  Slight nausea but no emesis.  Able to eat and drink fluids.  Denies questions at this time about her chemotherapy treatment.  Says I know it's necessary.  Encouraged to keep up with taking anti-emetic if continued nausea to stay hydrated and eat.

## 2012-01-01 NOTE — Telephone Encounter (Signed)
Message copied by Augusto Garbe on Fri Jan 01, 2012  1:56 PM ------      Message from: Melodie Bouillon R      Created: Thu Dec 31, 2011  2:35 PM      Regarding: Chemo f/u call      Contact: [phone]       First time Taxotere, Herceptin and Perjeta.  Dr. Welton Flakes

## 2012-01-01 NOTE — Telephone Encounter (Signed)
No phone call made 

## 2012-01-04 ENCOUNTER — Encounter: Payer: Self-pay | Admitting: *Deleted

## 2012-01-04 ENCOUNTER — Encounter (INDEPENDENT_AMBULATORY_CARE_PROVIDER_SITE_OTHER): Payer: BC Managed Care – PPO | Admitting: General Surgery

## 2012-01-04 ENCOUNTER — Telehealth: Payer: Self-pay | Admitting: Emergency Medicine

## 2012-01-04 NOTE — Progress Notes (Signed)
Mailed after appt letter to pt. 

## 2012-01-04 NOTE — Telephone Encounter (Signed)
Patient called with complaints of a "raging sore throat". Patient states she is using warm salt water gargles.  Denies any fever or other symptoms. Patient asking if there is anything else she should do.

## 2012-01-05 ENCOUNTER — Encounter: Payer: Self-pay | Admitting: Adult Health

## 2012-01-05 ENCOUNTER — Other Ambulatory Visit (HOSPITAL_BASED_OUTPATIENT_CLINIC_OR_DEPARTMENT_OTHER): Payer: BC Managed Care – PPO

## 2012-01-05 ENCOUNTER — Ambulatory Visit (HOSPITAL_BASED_OUTPATIENT_CLINIC_OR_DEPARTMENT_OTHER): Payer: BC Managed Care – PPO | Admitting: Adult Health

## 2012-01-05 ENCOUNTER — Other Ambulatory Visit: Payer: BC Managed Care – PPO | Admitting: Lab

## 2012-01-05 VITALS — BP 140/84 | HR 108 | Temp 99.0°F | Resp 20 | Ht 65.0 in | Wt 202.8 lb

## 2012-01-05 DIAGNOSIS — D709 Neutropenia, unspecified: Secondary | ICD-10-CM

## 2012-01-05 DIAGNOSIS — C50919 Malignant neoplasm of unspecified site of unspecified female breast: Secondary | ICD-10-CM

## 2012-01-05 DIAGNOSIS — Z170 Estrogen receptor positive status [ER+]: Secondary | ICD-10-CM

## 2012-01-05 DIAGNOSIS — C50219 Malignant neoplasm of upper-inner quadrant of unspecified female breast: Secondary | ICD-10-CM

## 2012-01-05 LAB — CBC WITH DIFFERENTIAL/PLATELET
BASO%: 2.6 % — ABNORMAL HIGH (ref 0.0–2.0)
LYMPH%: 59.8 % — ABNORMAL HIGH (ref 14.0–49.7)
MCHC: 33.8 g/dL (ref 31.5–36.0)
MONO#: 0.1 10*3/uL (ref 0.1–0.9)
Platelets: 63 10*3/uL — ABNORMAL LOW (ref 145–400)
RBC: 4.19 10*6/uL (ref 3.70–5.45)
RDW: 13.2 % (ref 11.2–14.5)
WBC: 1.2 10*3/uL — ABNORMAL LOW (ref 3.9–10.3)

## 2012-01-05 LAB — COMPREHENSIVE METABOLIC PANEL (CC13)
ALT: 25 U/L (ref 0–55)
Alkaline Phosphatase: 79 U/L (ref 40–150)
CO2: 29 mEq/L (ref 22–29)
Potassium: 3.5 mEq/L (ref 3.5–5.1)
Sodium: 139 mEq/L (ref 136–145)
Total Bilirubin: 1.29 mg/dL — ABNORMAL HIGH (ref 0.20–1.20)
Total Protein: 6.5 g/dL (ref 6.4–8.3)

## 2012-01-05 MED ORDER — CIPROFLOXACIN HCL 500 MG PO TABS: 500.0000 mg | ORAL_TABLET | Freq: Two times a day (BID) | ORAL | Status: DC

## 2012-01-05 NOTE — Patient Instructions (Signed)
  Patient Neutropenia Instruction Sheet  Diagnosis: Breast Cancer      Treating Physician: Drue Second, MD  Treatment: 1. Type of chemotherapy: Perjeta/Herceptin/Taxotere 2. Date of last treatment: 12/31/11  Last Blood Counts: Lab Results  Component Value Date   WBC 1.2* 01/05/2012   HGB 13.8 01/05/2012   HCT 40.8 01/05/2012   MCV 97.4 01/05/2012   PLT 63* 01/05/2012  ANC 300     Prophylactic Antibiotics: Cipro 500 mg by mouth twice a day Instructions: 1. Monitor temperature and call if fever  greater than 100.5, chills, shaking chills (rigors) 2. Call Physician on-call at [phone] 3. Give him/her symptoms and list of medications that you are taking and your last blood count.  Continue biotene.  Take salt water rinses after your meals if needed.

## 2012-01-05 NOTE — Progress Notes (Signed)
OFFICE PROGRESS NOTE  CC  Juluis Mire, MD [facility] [address] [place] [address] Dr. Sigmund Hazel Dr. Emelia Loron Dr. Lurline Hare  DIAGNOSIS: 66 year old female with invasive mammary carcinoma of the left breast ER positive PR positive HER-2/neu positive with Ki-67 94%.  PRIOR THERAPY:  #1 patient was originally seen in the multidisciplinary breast clinic on 12/16/2011. She was diagnosed with stage II a HER-2 positive ER/PR positive breast cancer of the left breast at the 3:00 position. She also had a second area that was ER/PR positive but HER-2/neu negative. Patient is interested in breast conservation.  #2 patient is planning on receiving neoadjuvant chemotherapy consisting of perjeta Herceptin and Taxotere given every 21 days.  CURRENT THERAPY: Neoadjuvant perjeta/herceptin/taxotere Cycle 1 Day 7  INTERVAL HISTORY: KAYLAH JERAULD 66 y.o. female returns for followup visit. She was added on because she feels like she was getting sick.   She has a tiny sore throat, but is otherwise without complaints.  She did have some pain after the neulasta, and a minimal amount of numbness and tingling in her fingers and toes.    MEDICAL HISTORY: Past Medical History  Diagnosis Date  . Anemia   . SVT (supraventricular tachycardia)     ALLERGIES:  is allergic to cephalosporins and codeine.  MEDICATIONS:  Current Outpatient Prescriptions  Medication Sig Dispense Refill  . Ascorbic Acid (VITAMIN C PO) Take 500 mg by mouth.       Marland Kitchen CALCIUM PO Take 1,200 mg by mouth.       . Cholecalciferol (VITAMIN D PO) Take 500 mg by mouth daily.       . Cyanocobalamin (VITAMIN B 12 PO) Take 3,000 mg by mouth daily.       Marland Kitchen dexamethasone (DECADRON) 4 MG tablet Take 2 tabs two times a day the day before Taxotere chemo. Then take 2 tabs daily starting the day after chemo for 2 days.  30 tablet  1  . ECHINACEA PO Take 800 mg by mouth daily.        . IRON PO Take 65 mg by mouth daily.       Marland Kitchen lidocaine-prilocaine (EMLA) cream Apply topically as needed. Apply to port as directed  30 g  6  . LORazepam (ATIVAN) 0.5 MG tablet Take 1 tablet (0.5 mg total) by mouth every 6 (six) hours as needed (Nausea or vomiting).  30 tablet  0  . metoprolol succinate (TOPROL-XL) 100 MG 24 hr tablet Take 1 tablet (100 mg total) by mouth daily.  90 tablet  1  . ondansetron (ZOFRAN) 8 MG tablet Take 1 tablet two times a day for 2 days starting the day after chemo, then take two times a day as needed for nausea or vomiting.  30 tablet  1  . oxyCODONE-acetaminophen (ROXICET) 5-325 MG per tablet Take 1 tablet by mouth every 4 (four) hours as needed for pain.  10 tablet  0  . prochlorperazine (COMPAZINE) 10 MG tablet Take 1 tablet (10 mg total) by mouth every 6 (six) hours as needed (Nausea or vomiting).  30 tablet  1  . prochlorperazine (COMPAZINE) 25 MG suppository Place 1 suppository (25 mg total) rectally every 12 (twelve) hours as needed for nausea.  12 suppository  3  . ciprofloxacin (CIPRO) 500 MG tablet Take 1 tablet (500 mg total) by mouth 2 (two) times daily.  14 tablet  6    SURGICAL HISTORY:  Past Surgical History  Procedure Date  . Dilation and curettage of uterus   . Eye surgery     age 72-lt eye growth  . Colonoscopy   . Portacath placement 12/23/2011    Procedure: INSERTION PORT-A-CATH;  Surgeon: Emelia Loron, MD;  Location: [facility];  Service: General;  Laterality: Right;    REVIEW OF SYSTEMS:   General: fatigue (-), night sweats (-), fever (-), pain (-) Lymph: palpable nodes (-) HEENT: vision changes (-), mucositis (-), gum bleeding (-), epistaxis (-) Cardiovascular: chest pain (-), palpitations (-) Pulmonary: shortness of breath (-), dyspnea on exertion (-), cough (-), hemoptysis (-) GI:  Early satiety (-), melena (-), dysphagia (-), nausea/vomiting (-), diarrhea (-) GU: dysuria (-), hematuria (-), incontinence  (-) Musculoskeletal: joint swelling (-), joint pain (-), back pain (-) Neuro: weakness (-), numbness (-), headache (-), confusion (-) Skin: Rash (-), lesions (-), dryness (-) Psych: depression (-), suicidal/homicidal ideation (-), feeling of hopelessness (-)   PHYSICAL EXAMINATION: Blood pressure 140/84, pulse 108, temperature 99 F (37.2 C), temperature source Oral, resp. rate 20, height 5\' 5"  (1.651 m), weight 202 lb 12.8 oz (91.989 kg). Body mass index is 33.75 kg/(m^2). General: Patient is a well appearing female in no acute distress HEENT: PERRLA, sclerae anicteric no conjunctival pallor, MMM Neck: supple, no palpable adenopathy Lungs: clear to auscultation bilaterally, no wheezes, rhonchi, or rales Cardiovascular: regular rate rhythm, S1, S2, no murmurs, rubs or gallops Abdomen: Soft, non-tender, non-distended, normoactive bowel sounds, no HSM Extremities: warm and well perfused, no clubbing, cyanosis, or edema Skin: No rashes or lesions Neuro: Non-focal Left breast does reveal a palpable mass at the 3:00 position a second breast mass is noted at about the 10:00 position. ECOG PERFORMANCE STATUS: 0 - Asymptomatic  LABORATORY DATA: Lab Results  Component Value Date   WBC 1.2* 01/05/2012   HGB 13.8 01/05/2012   HCT 40.8 01/05/2012   MCV 97.4 01/05/2012   PLT 63* 01/05/2012      Chemistry      Component Value Date/Time   NA 139 01/05/2012 1252   NA 143 12/23/2011 0947   K 3.5 01/05/2012 1252   K 3.8 12/23/2011 0947   CL 104 01/05/2012 1252   CL 106 12/23/2011 0947   CO2 29 01/05/2012 1252   BUN 14.0 01/05/2012 1252   BUN 13 12/23/2011 0947   CREATININE 0.7 01/05/2012 1252   CREATININE 0.70 12/23/2011 0947      Component Value Date/Time   CALCIUM 9.4 01/05/2012 1252   ALKPHOS 79 01/05/2012 1252   AST 14 01/05/2012 1252   ALT 25 01/05/2012 1252   BILITOT 1.29* 01/05/2012 1252       RADIOGRAPHIC STUDIES:  US Breast Left  12/14/2011  *RADIOLOGY REPORT*  Clinical Data:   Abnormal MRI, the second look left breast ultrasound  LEFT BREAST ULTRASOUND  Comparison:  MRI dated 12/11/2011  On physical exam, I palpate normal tissue in the 3 o'clock position of the left breast, 3 cm from the nipple.  Findings: Ultrasound is performed, showing an oval heterogeneously hypoechoic mass with microlobulated margins in the 3 o'clock position, 3 cm the nipple measuring 1.2 x 0.7 x 1.1 cm.  This corresponds to the area of concern on recent MRI.  IMPRESSION: Mass, left breast which biopsy is done and reported separately.  RECOMMENDATION: Ultrasound guided left breast biopsy.  BI-RADS CATEGORY 4:  Suspicious abnormality - biopsy should be considered.   Original Report Authenticated By: Hiram Gash, M.D.    US  Breast Left  12/01/2011  *RADIOLOGY REPORT*  Clinical Data:  66 year old female with palpable lump in the upper left breast discovered on self examination.  Also for annual bilateral mammograms.  DIGITAL DIAGNOSTIC BILATERAL MAMMOGRAM WITH CAD AND LEFT BREAST ULTRASOUND:  Comparison:  03/18/2009 mammograms.  Findings:  A spot compression view of the left breast and routine views of both breasts again demonstrate heterogeneously dense breast tissue. Within the upper left breast, an irregular mass with pleomorphic and linear calcifications is identified.  These calcifications span a length of 2 cm. No other mass, distortion or suspicious calcifications are identified bilaterally. Mammographic images were processed with CAD.  On physical exam, a firm mass is identified at the 12 o'clock position of the left breast 5 cm from the nipple.  Ultrasound is performed, showing a 10 x 9 x 8 mm irregular hypoechoic mass at the 12 o'clock position of the left breast 5 cm from the nipple.  A 3 mm satellite nodule lies 3 mm superiomedial to the dominant mass. No definite enlarged abnormal appearing left axillary lymph nodes are identified.  IMPRESSION: 10 x 9 x 8 mm irregular mass in the upper left  breast identified sonographically which likely represents the mammographic finding, as well as an adjacent 3 mm satellite nodule.  These findings are very worrisome for malignancy and tissue sampling is recommended.  These findings were discussed with the patient and her questions answered.  She was encouraged to begin/continue monthly self exams and to contact her primary physician if any changes noted.  BI-RADS CATEGORY 5:  Highly suggestive of malignancy - appropriate action should be taken.  RECOMMENDATION: Ultrasound guided left breast biopsy.  This has been scheduled for 12/07/2011 and the patient informed.   Original Report Authenticated By: Rosendo Gros, M.D.    Mr Breast Bilateral W Wo Contrast  12/11/2011  *RADIOLOGY REPORT*  Clinical Data: Recently biopsy-proven left breast invasive ductal carcinoma manifesting as a palpable left breast mass 12 o'clock location.  BILATERAL BREAST MRI WITH AND WITHOUT CONTRAST  Technique: Multiplanar, multisequence MR images of both breasts were obtained prior to and following the intravenous administration of 20ml of Multihance.  Three dimensional images were evaluated at the independent DynaCad workstation.  Comparison:  Prior mammograms and ultrasound  Findings: Post biopsy change is noted in the left breast 12 o'clock location as well as clip artifact corresponding to the biopsy- proven breast cancer.  No other abnormal T2-weighted hyperintensity on either side.  No lymphadenopathy.  Background parenchymal enhancement type pattern is mild.  In the left breast 12 o'clock location is an irregular spiculated enhancing mass with plateau type kinetics measuring 2.7 x 2.2 x 2.0 cm.  This corresponds to the biopsy-proven breast cancer.  In the left breast three o'clock location, there is a mildly irregular enhancing mass plateau type enhancing kinetics measuring 1.1 x 1.1 x 1.0 cm.  No dominant area of abnormal enhancement seen in the right breast.  IMPRESSION: Abnormal  enhancing mass left breast 12 o'clock location at the site of biopsy-proven breast cancer.  A second abnormally enhancing mass in the left breast in the 3 o'clock location is identified; second look ultrasound is recommended for further evaluation and possible ultrasound-guided biopsy.  If no sonographic correlate is identified, MRI guided core biopsy would be recommended.  No evidence for malignancy in the right breast.  RECOMMENDATION: BI-RADS CATEGORY 4:  Suspicious abnormality - biopsy should be considered.  THREE-DIMENSIONAL MR IMAGE RENDERING ON INDEPENDENT WORKSTATION:  Three-dimensional MR  images were rendered by post-processing of the original MR data on an independent workstation.  The three- dimensional MR images were interpreted, and findings were reported in the accompanying complete MRI report for this study.  We will contact the patient to arrange for follow-up ultrasound.   Original Report Authenticated By: Harrel Lemon, M.D.    [place] Core Biopsy  12/17/2011  **ADDENDUM** CREATED: 12/15/2011 12:01:32  Histologic evaluation demonstrates invasive and in situ mammary carcinoma.  Grade II ductal carcinoma is favored.  This is concordant with the imaging findings.  Results were discussed with the patient by telephone at her request.  She reports no complications from the procedure.  She is scheduled to be seen in the [facility] on 12/16/2011.  **END ADDENDUM** SIGNED BY: Cain Saupe, M.D.   12/15/2011  **ADDENDUM** CREATED: 12/15/2011 12:01:32  Histologic evaluation demonstrates invasive and in situ mammary carcinoma.  Grade II ductal carcinoma is favored.  This is concordant with the imaging findings.  Results were discussed with the patient by telephone at her request.  She reports no complications from the procedure.  She is scheduled to be seen in the [facility] on 12/16/2011.  **END ADDENDUM** SIGNED BY: Cain Saupe, M.D.   12/14/2011  *RADIOLOGY REPORT*  Clinical Data:  New diagnosis left sided breast cancer.  Abnormal MRI.  ULTRASOUND GUIDED CORE BIOPSY OF THE left BREAST  Comparison: Previous exams.  I met with the patient and we discussed the procedure of ultrasound- guided biopsy, including benefits and alternatives.  We discussed the high likelihood of a successful procedure. We discussed the risks of the procedure, including infection, bleeding, tissue injury, clip migration, and inadequate sampling.  Informed written consent was given.  Using sterile technique 2% lidocaine, ultrasound guidance and a 14 gauge automated biopsy device, biopsy was performed of the mass using a  lateral approach.  At the conclusion of the procedure a wing tissue marker clip was deployed into the biopsy cavity. Follow up 2 view mammogram was performed and dictated separately.  IMPRESSION: Ultrasound guided biopsy of a left breast mass.  No apparent complications.  Original Report Authenticated By: Daryl Eastern, M.D.    [place] Core Biopsy  12/04/2011  *RADIOLOGY REPORT*  Clinical Data:  Suspicious left breast mass 12 o'clock location  ULTRASOUND GUIDED VACUUM ASSISTED CORE BIOPSY OF THE LEFT BREAST  Comparison: Previous exams.  I met with the patient and we discussed the procedure of ultrasound- guided biopsy, including benefits and alternatives.  We discussed the high likelihood of a successful procedure. We discussed the risks of the procedure including infection, bleeding, tissue injury, clip migration, and inadequate sampling.  Informed written consent was given.  Using sterile technique, 2% lidocaine ultrasound guidance and a 12 gauge vacuum assisted needle biopsy was performed of left breast mass 12 o'clock location using a lateral to medial approach.  At the conclusion of the procedure, a ribbon shaped tissue marker clip was deployed into the biopsy cavity.  Follow-up 2-view mammogram was performed and dictated separately.   IMPRESSION:  Ultrasound-guided biopsy of left breast mass 12 o'clock location with ribbon shaped clip placement.  Pathology is pending.  No apparent complications.   Original Report Authenticated By: Harrel Lemon, M.D.    Dg Chest Port 1 View  12/23/2011  *RADIOLOGY REPORT*  Clinical Data: Postop for Port-A-Cath placement.  Rule out pneumothorax.  PORTABLE CHEST - 1 VIEW  Comparison: 11/03/2005  Findings: Right-sided Port-A-Cath  terminates at the mid to low SVC. No pneumothorax.  There is biapical pleural thickening.  Midline trachea.  Normal heart size and mediastinal contours.  No pleural fluid. Clear lungs.  IMPRESSION: New right-sided Port-A-Cath, without pneumothorax.   Original Report Authenticated By: Consuello Bossier, M.D.    Mm Digital Diagnostic Bilat  12/01/2011  *RADIOLOGY REPORT*  Clinical Data:  66 year old female with palpable lump in the upper left breast discovered on self examination.  Also for annual bilateral mammograms.  DIGITAL DIAGNOSTIC BILATERAL MAMMOGRAM WITH CAD AND LEFT BREAST ULTRASOUND:  Comparison:  03/18/2009 mammograms.  Findings:  A spot compression view of the left breast and routine views of both breasts again demonstrate heterogeneously dense breast tissue. Within the upper left breast, an irregular mass with pleomorphic and linear calcifications is identified.  These calcifications span a length of 2 cm. No other mass, distortion or suspicious calcifications are identified bilaterally. Mammographic images were processed with CAD.  On physical exam, a firm mass is identified at the 12 o'clock position of the left breast 5 cm from the nipple.  Ultrasound is performed, showing a 10 x 9 x 8 mm irregular hypoechoic mass at the 12 o'clock position of the left breast 5 cm from the nipple.  A 3 mm satellite nodule lies 3 mm superiomedial to the dominant mass. No definite enlarged abnormal appearing left axillary lymph nodes are identified.  IMPRESSION: 10 x 9 x 8 mm irregular  mass in the upper left breast identified sonographically which likely represents the mammographic finding, as well as an adjacent 3 mm satellite nodule.  These findings are very worrisome for malignancy and tissue sampling is recommended.  These findings were discussed with the patient and her questions answered.  She was encouraged to begin/continue monthly self exams and to contact her primary physician if any changes noted.  BI-RADS CATEGORY 5:  Highly suggestive of malignancy - appropriate action should be taken.  RECOMMENDATION: Ultrasound guided left breast biopsy.  This has been scheduled for 12/07/2011 and the patient informed.   Original Report Authenticated By: Rosendo Gros, M.D.    Mm Digital Diagnostic Unilat L  12/14/2011  *RADIOLOGY REPORT*  Clinical Data:  Known malignancy, left breast.  Ultrasound core biopsy for an area seen on recent MRI.  DIGITAL DIAGNOSTIC LEFT MAMMOGRAM  Comparison:  Previous exams.  Findings:  Films are performed following ultrasound guided biopsy of a left breast mass.  Images show a wing type clip in association with the mass seen sonographically in the 3 o'clock position of the left breast.  The clip is 6.8 cm lateral and inferior to the known malignancy.  IMPRESSION: Adequate clip placement following ultrasound guided left breast biopsy.   Original Report Authenticated By: Hiram Gash, M.D.    Mm Digital Diagnostic Unilat L  12/04/2011  *RADIOLOGY REPORT*  Clinical Data:  Ultrasound-guided left breast biopsy with clip placement  DIGITAL DIAGNOSTIC LEFT MAMMOGRAM  Comparison:  Previous exams.  Findings:  Films are performed following ultrasound guided biopsy of left breast mass 12 o'clock location.  The ribbon shaped clip is appropriately located at the site of the mammographically evident mass in the 12 o'clock location with associated calcifications.  IMPRESSION: Appropriate ribbon shaped clip location at the site of the biopsied left breast 12 o'clock  location mass.   Original Report Authenticated By: Harrel Lemon, M.D.    Mm Radiologist Eval And Mgmt  12/07/2011  *RADIOLOGY REPORT*  Clinical Data: Post biopsy evaluation  CONSULTATION  Comparison: December 01, 2011, March 18, 2009  Findings: The pathology revealed invasive ductal carcinoma and ductal carcinoma ins situ.  This is found to be concordant with imaging findings.  I discussed the results with the the patient. The biopsy site is clean.  There is no complications.  The patient states she is doing well without problems post biopsy.  The patient has appointment for [facility] on December 16, 2011.  The patient has appointment for MRI of the breasts on December 11, 2011.  IMPRESSION: Post biopsy consultation as described.  Recommend MRI of the breasts and and  [facility] as described.   Original Report Authenticated By: Sherian Rein, M.D.    Dg Fluoro Guide Cv Line-no Report  12/23/2011  CLINICAL DATA: port placement   FLOURO GUIDE CV LINE  Fluoroscopy was utilized by the requesting physician.  No radiographic  interpretation.     ASSESSMENT: 66 year old female with new diagnosis of  #1 stage II invasive mammary carcinoma of the left breast that is ER positive PR positive HER-2/neu positive with Ki-67 94%. Patient is interested in breast conservation. There for she will begin neoadjuvant chemotherapy consisting of perjeta, Herceptin, and Taxotere. This will be given every 21 days for a total of 4 cycles. After that we will plan on getting MRI of the breasts performed to evaluate response.  #2 patient will get Port-A-Cath placement chemotherapy teaching class as well as an echocardiogram.  #3 she will continue to be seen by Dr. Peter [place] for on going cardiac surveillance and she is receiving Her2 based therapy.   PLAN:   #1 Ms. Gowen is neutropenic.  I called in a Cipro prescription to her pharmacy.  I gave her detailed education regarding Neutropenic precautions and when to call  the doctor on call/triage line.    #2 We will see her back on 11/21 for her next chemotherapy treatment.       All questions were answered. The patient knows to call the clinic with any problems, questions or concerns. We can certainly see the patient much sooner if necessary.  I spent 25 minutes counseling the patient face to face. The total time spent in the appointment was 30 minutes.  This case was reviewed with Dr. Welton Flakes.   Cherie Ouch Lyn Hollingshead, NP Medical Oncology [facility] Phone: [phone]

## 2012-01-06 ENCOUNTER — Encounter: Payer: Self-pay | Admitting: *Deleted

## 2012-01-06 ENCOUNTER — Ambulatory Visit: Payer: BC Managed Care – PPO | Admitting: Adult Health

## 2012-01-06 ENCOUNTER — Other Ambulatory Visit: Payer: BC Managed Care – PPO | Admitting: Lab

## 2012-01-08 ENCOUNTER — Encounter (INDEPENDENT_AMBULATORY_CARE_PROVIDER_SITE_OTHER): Payer: BC Managed Care – PPO | Admitting: General Surgery

## 2012-01-08 ENCOUNTER — Encounter (HOSPITAL_COMMUNITY): Payer: Self-pay | Admitting: *Deleted

## 2012-01-08 ENCOUNTER — Emergency Department (HOSPITAL_COMMUNITY)
Admission: EM | Admit: 2012-01-08 | Discharge: 2012-01-08 | Disposition: A | Discharge status: Home / Self Care | Payer: BC Managed Care – PPO | Attending: Emergency Medicine | Admitting: Emergency Medicine

## 2012-01-08 ENCOUNTER — Telehealth: Payer: Self-pay | Admitting: Physician Assistant

## 2012-01-08 ENCOUNTER — Other Ambulatory Visit: Payer: Self-pay

## 2012-01-08 DIAGNOSIS — Z79899 Other long term (current) drug therapy: Secondary | ICD-10-CM | POA: Insufficient documentation

## 2012-01-08 DIAGNOSIS — I498 Other specified cardiac arrhythmias: Secondary | ICD-10-CM | POA: Insufficient documentation

## 2012-01-08 DIAGNOSIS — E860 Dehydration: Secondary | ICD-10-CM

## 2012-01-08 DIAGNOSIS — I471 Supraventricular tachycardia: Secondary | ICD-10-CM

## 2012-01-08 DIAGNOSIS — D649 Anemia, unspecified: Secondary | ICD-10-CM | POA: Insufficient documentation

## 2012-01-08 DIAGNOSIS — Z853 Personal history of malignant neoplasm of breast: Secondary | ICD-10-CM | POA: Insufficient documentation

## 2012-01-08 HISTORY — DX: Malignant neoplasm of unspecified site of unspecified female breast: C50.919

## 2012-01-08 LAB — POCT I-STAT, CHEM 8
Calcium, Ion: 1.09 mmol/L — ABNORMAL LOW (ref 1.13–1.30)
HCT: 48 % — ABNORMAL HIGH (ref 36.0–46.0)
Hemoglobin: 16.3 g/dL — ABNORMAL HIGH (ref 12.0–15.0)
Sodium: 140 mEq/L (ref 135–145)
TCO2: 20 mmol/L (ref 0–100)

## 2012-01-08 LAB — POCT I-STAT TROPONIN I: Troponin i, poc: 0.02 ng/mL (ref 0.00–0.08)

## 2012-01-08 MED ORDER — ADENOSINE 6 MG/2ML IV SOLN
INTRAVENOUS | Status: AC
  Filled 2012-01-08: qty 6

## 2012-01-08 MED ORDER — METOPROLOL SUCCINATE ER 100 MG PO TB24
100.0000 mg | ORAL_TABLET | Freq: Every day | ORAL | Status: DC
  Administered 2012-01-08: 100 mg via ORAL
  Filled 2012-01-08: qty 1

## 2012-01-08 MED ORDER — SODIUM CHLORIDE 0.9 % IV BOLUS (SEPSIS)
1000.0000 mL | Freq: Once | INTRAVENOUS | Status: AC
  Administered 2012-01-08: 1000 mL via INTRAVENOUS

## 2012-01-08 NOTE — ED Notes (Signed)
To ed with heart racing. Found in SVT

## 2012-01-08 NOTE — ED Provider Notes (Signed)
History     CSN: [id number]  Arrival date & time 01/08/12  1850   First MD Initiated Contact with Patient 01/08/12 1912      Chief Complaint  Patient presents with  . 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  Patient has previous history of same in the past.  She attempted Valsalva maneuvers but was unsuccessful.  Patient also has history of breast cancer.  She states she's not been drinking as much as she should last few days.      Past Medical History  Diagnosis Date  . Anemia   . SVT (supraventricular tachycardia)   . Breast cancer     Past Surgical History  Procedure Date  . Dilation and curettage of uterus   . Eye surgery     age 66-lt eye growth  . Colonoscopy   . Portacath placement 12/23/2011    Procedure: INSERTION PORT-A-CATH;  Surgeon: Emelia Loron, MD;  Location: [facility];  Service: General;  Laterality: Right;    History reviewed. No pertinent family history.  History  Substance Use Topics  . Smoking status: Never Smoker   . Smokeless tobacco: Never Used  . Alcohol Use: Yes    OB History    Grav Para Term Preterm Abortions TAB SAB Ect Mult Living                  Review of Systems Level V caveat (unstable vital signs) Allergies  Cephalosporins and Codeine  Home Medications   Current Outpatient Rx  Name  Route  Sig  Dispense  Refill  . VITAMIN C PO   Oral   Take 500 mg by mouth daily.          Marland Kitchen CALCIUM PO   Oral   Take 1,200 mg by mouth daily.          Marland Kitchen VITAMIN D PO   Oral   Take 500 mg by mouth daily.          Marland Kitchen CIPROFLOXACIN HCL 500 MG PO TABS   Oral   Take 1 tablet (500 mg total) by mouth 2 (two) times daily.   14 tablet   6   . VITAMIN B 12 PO   Oral   Take 3,000 mg by mouth daily.          Marland Kitchen DEXAMETHASONE 4 MG PO TABS      Take 2 tabs two times a day the day before Taxotere chemo. Then take 2 tabs daily starting the day after chemo for 2  days.   30 tablet   1   . IRON PO   Oral   Take 65 mg by mouth daily.          Marland Kitchen LIDOCAINE-PRILOCAINE 2.5-2.5 % EX CREA   Topical   Apply topically as needed. Apply to port as directed   30 g   6   . LORAZEPAM 0.5 MG PO TABS   Oral   Take 1 tablet (0.5 mg total) by mouth every 6 (six) hours as needed (Nausea or vomiting).   30 tablet   0   . METOPROLOL SUCCINATE ER 100 MG PO TB24   Oral   Take 1 tablet (100 mg total) by mouth daily.   90 tablet   1   . ONDANSETRON HCL 8 MG PO TABS      Take 1 tablet two times a day for  2 days starting the day after chemo, then take two times a day as needed for nausea or vomiting.   30 tablet   1   . OXYCODONE-ACETAMINOPHEN 5-325 MG PO TABS   Oral   Take 1 tablet by mouth every 4 (four) hours as needed for pain.   10 tablet   0   . PROCHLORPERAZINE MALEATE 10 MG PO TABS   Oral   Take 1 tablet (10 mg total) by mouth every 6 (six) hours as needed (Nausea or vomiting).   30 tablet   1   . PROCHLORPERAZINE 25 MG RE SUPP   Rectal   Place 1 suppository (25 mg total) rectally every 12 (twelve) hours as needed for nausea.   12 suppository   3     BP 112/65  Pulse 123  Resp 24  SpO2 97%  Physical Exam  Nursing note and vitals reviewed. Constitutional: She is oriented to person, place, and time. She appears well-developed and well-nourished. She appears distressed.  HENT:  Head: Normocephalic and atraumatic.  Eyes: Pupils are equal, round, and reactive to light.  Neck: Normal range of motion.  Cardiovascular: Intact distal pulses.  Tachycardia present.   Pulmonary/Chest: No respiratory distress.  Abdominal: Normal appearance. She exhibits no distension. There is no tenderness. There is no rebound.  Musculoskeletal: Normal range of motion.  Neurological: She is alert and oriented to person, place, and time. No cranial nerve deficit.  Skin: Skin is warm and dry. No rash noted.  Psychiatric: She has a normal mood and affect.  Her behavior is normal.    ED Course  Procedures (including critical care time)  Medications  adenosine (ADENOCARD) 6 MG/2ML injection (not administered)  metoprolol succinate (TOPROL-XL) 24 hr tablet 100 mg (100 mg Oral Given 01/08/12 1951)  sodium chloride 0.9 % bolus 1,000 mL (1000 mL Intravenous New Bag/Given 01/08/12 2123)    EKG  Date: 01/08/2012  Rate: 220  Rhythm: normal sinus rhythm  QRS Axis: normal  Intervals: normal  ST/T Wave abnormalities: normal  Conduction Disutrbances: none  Narrative Interpretation: Narrow complex tachycardia     CRITICAL CARE Performed by: Nelva Nay L   Total critical care time: 30 min  Critical care time was exclusive of separately billable procedures and treating other patients.  Critical care was necessary to treat or prevent imminent or life-threatening deterioration.  Critical care was time spent personally by me on the following activities: development of treatment plan with patient and/or surrogate as well as nursing, discussions with consultants, evaluation of patient's response to treatment, examination of patient, obtaining history from patient or surrogate, ordering and performing treatments and interventions, ordering and review of laboratory studies, ordering and review of radiographic studies, pulse oximetry and re-evaluation of patient's condition.   Labs Reviewed  POCT I-STAT, CHEM 8 - Abnormal; Notable for the following:    Potassium 3.4 (*)     Glucose, Bld 166 (*)     Calcium, Ion 1.09 (*)     Hemoglobin 16.3 (*)     HCT 48.0 (*)     All other components within normal limits  POCT I-STAT TROPONIN I   No results found.   1. SVT (supraventricular tachycardia)   2. Dehydration       MDM   After treatment in the ED the patient feels back to baseline and wants to go home.        Nelia Shi, MD 01/08/12 2224

## 2012-01-08 NOTE — Telephone Encounter (Signed)
Pt called to notify us that she feels she is in recurrent SVT and tried carotid massage/grunting/valsalva at home without success so is being driven to the ER. I thanked her for letting us know and she will plan eval by EDP initially, told her they will call us if they feel they need [place] to consult. Keagon Glascoe PA-C

## 2012-01-12 ENCOUNTER — Other Ambulatory Visit: Payer: BC Managed Care – PPO | Admitting: Lab

## 2012-01-19 ENCOUNTER — Ambulatory Visit: Payer: BC Managed Care – PPO

## 2012-01-19 ENCOUNTER — Other Ambulatory Visit: Payer: BC Managed Care – PPO | Admitting: Lab

## 2012-01-21 ENCOUNTER — Telehealth: Payer: Self-pay | Admitting: *Deleted

## 2012-01-21 ENCOUNTER — Other Ambulatory Visit (HOSPITAL_BASED_OUTPATIENT_CLINIC_OR_DEPARTMENT_OTHER): Payer: BC Managed Care – PPO | Admitting: Lab

## 2012-01-21 ENCOUNTER — Encounter: Payer: Self-pay | Admitting: Adult Health

## 2012-01-21 ENCOUNTER — Ambulatory Visit (HOSPITAL_BASED_OUTPATIENT_CLINIC_OR_DEPARTMENT_OTHER): Payer: BC Managed Care – PPO

## 2012-01-21 ENCOUNTER — Ambulatory Visit (HOSPITAL_BASED_OUTPATIENT_CLINIC_OR_DEPARTMENT_OTHER): Payer: BC Managed Care – PPO | Admitting: Adult Health

## 2012-01-21 VITALS — BP 131/80 | HR 84 | Temp 98.5°F | Resp 20 | Ht 65.0 in | Wt 199.8 lb

## 2012-01-21 DIAGNOSIS — C50919 Malignant neoplasm of unspecified site of unspecified female breast: Secondary | ICD-10-CM

## 2012-01-21 DIAGNOSIS — C50219 Malignant neoplasm of upper-inner quadrant of unspecified female breast: Secondary | ICD-10-CM

## 2012-01-21 DIAGNOSIS — E860 Dehydration: Secondary | ICD-10-CM

## 2012-01-21 DIAGNOSIS — I498 Other specified cardiac arrhythmias: Secondary | ICD-10-CM

## 2012-01-21 DIAGNOSIS — Z5112 Encounter for antineoplastic immunotherapy: Secondary | ICD-10-CM

## 2012-01-21 DIAGNOSIS — Z5111 Encounter for antineoplastic chemotherapy: Secondary | ICD-10-CM

## 2012-01-21 LAB — CBC WITH DIFFERENTIAL/PLATELET
BASO%: 0.4 % (ref 0.0–2.0)
Basophils Absolute: 0 10*3/uL (ref 0.0–0.1)
EOS%: 0 % (ref 0.0–7.0)
MCH: 34 pg (ref 25.1–34.0)
MCHC: 34.8 g/dL (ref 31.5–36.0)
MCV: 97.8 fL (ref 79.5–101.0)
MONO%: 7 % (ref 0.0–14.0)
RBC: 3.67 10*6/uL — ABNORMAL LOW (ref 3.70–5.45)
RDW: 13.5 % (ref 11.2–14.5)
lymph#: 0.7 10*3/uL — ABNORMAL LOW (ref 0.9–3.3)

## 2012-01-21 LAB — COMPREHENSIVE METABOLIC PANEL (CC13)
ALT: 18 U/L (ref 0–55)
AST: 15 U/L (ref 5–34)
Albumin: 3.7 g/dL (ref 3.5–5.0)
Alkaline Phosphatase: 69 U/L (ref 40–150)
BUN: 12 mg/dL (ref 7.0–26.0)
Potassium: 4.2 mEq/L (ref 3.5–5.1)
Sodium: 141 mEq/L (ref 136–145)

## 2012-01-21 MED ORDER — SODIUM CHLORIDE 0.9 % IV SOLN
420.0000 mg | Freq: Once | INTRAVENOUS | Status: AC
  Administered 2012-01-21: 420 mg via INTRAVENOUS
  Filled 2012-01-21: qty 14

## 2012-01-21 MED ORDER — ACETAMINOPHEN 325 MG PO TABS
650.0000 mg | ORAL_TABLET | Freq: Once | ORAL | Status: AC
  Administered 2012-01-21: 650 mg via ORAL

## 2012-01-21 MED ORDER — DIPHENHYDRAMINE HCL 25 MG PO CAPS
50.0000 mg | ORAL_CAPSULE | Freq: Once | ORAL | Status: AC
  Administered 2012-01-21: 50 mg via ORAL

## 2012-01-21 MED ORDER — HEPARIN SOD (PORK) LOCK FLUSH 100 UNIT/ML IV SOLN
500.0000 [IU] | Freq: Once | INTRAVENOUS | Status: AC | PRN
  Administered 2012-01-21: 500 [IU]
  Filled 2012-01-21: qty 5

## 2012-01-21 MED ORDER — ONDANSETRON 8 MG/50ML IVPB (CHCC)
8.0000 mg | Freq: Once | INTRAVENOUS | Status: AC
  Administered 2012-01-21: 8 mg via INTRAVENOUS

## 2012-01-21 MED ORDER — LORAZEPAM 2 MG/ML IJ SOLN
0.5000 mg | Freq: Once | INTRAMUSCULAR | Status: AC
  Administered 2012-01-21: 0.5 mg via INTRAVENOUS

## 2012-01-21 MED ORDER — SODIUM CHLORIDE 0.9 % IJ SOLN
10.0000 mL | INTRAMUSCULAR | Status: DC | PRN
  Administered 2012-01-21: 10 mL
  Filled 2012-01-21: qty 10

## 2012-01-21 MED ORDER — DEXAMETHASONE SODIUM PHOSPHATE 10 MG/ML IJ SOLN
10.0000 mg | Freq: Once | INTRAMUSCULAR | Status: AC
  Administered 2012-01-21: 10 mg via INTRAVENOUS

## 2012-01-21 MED ORDER — SODIUM CHLORIDE 0.9 % IV SOLN
Freq: Once | INTRAVENOUS | Status: AC
  Administered 2012-01-21: 13:00:00 via INTRAVENOUS

## 2012-01-21 MED ORDER — TRASTUZUMAB CHEMO INJECTION 440 MG
6.0000 mg/kg | Freq: Once | INTRAVENOUS | Status: AC
  Administered 2012-01-21: 567 mg via INTRAVENOUS
  Filled 2012-01-21: qty 27

## 2012-01-21 MED ORDER — DOCETAXEL CHEMO INJECTION 160 MG/16ML
75.0000 mg/m2 | Freq: Once | INTRAVENOUS | Status: AC
  Administered 2012-01-21: 160 mg via INTRAVENOUS
  Filled 2012-01-21: qty 16

## 2012-01-21 NOTE — Telephone Encounter (Signed)
Gave patient appointment for fluids on 01-22-2012  Gave patient appointment for fluids on 01-27-2012

## 2012-01-21 NOTE — Patient Instructions (Addendum)
[facility] Discharge Instructions for Patients Receiving Chemotherapy  Today you received the following chemotherapy agents Taxotere, Herceptin and Perjeta.  To help prevent nausea and vomiting after your treatment, we encourage you to take your nausea medication as prescribed.   If you develop nausea and vomiting that is not controlled by your nausea medication, call the clinic. If it is after clinic hours your family physician or the after hours number for the clinic or go to the Emergency Department.   BELOW ARE SYMPTOMS THAT SHOULD BE REPORTED IMMEDIATELY:  *FEVER GREATER THAN 100.5 F  *CHILLS WITH OR WITHOUT FEVER  NAUSEA AND VOMITING THAT IS NOT CONTROLLED WITH YOUR NAUSEA MEDICATION  *UNUSUAL SHORTNESS OF BREATH  *UNUSUAL BRUISING OR BLEEDING  TENDERNESS IN MOUTH AND THROAT WITH OR WITHOUT PRESENCE OF ULCERS  *URINARY PROBLEMS  *BOWEL PROBLEMS  UNUSUAL RASH Items with * indicate a potential emergency and should be followed up as soon as possible.  One of the nurses will contact you 24 hours after your treatment. Please let the nurse know about any problems that you may have experienced. Feel free to call the clinic you have any questions or concerns. The clinic phone number is [phone].   I have been informed and understand all the instructions given to me. I know to contact the clinic, my physician, or go to the Emergency Department if any problems should occur. I do not have any questions at this time, but understand that I may call the clinic during office hours   should I have any questions or need assistance in obtaining follow up care.    __________________________________________  _____________  __________ Signature of Patient or Authorized Representative            Date                   Time    __________________________________________ Nurse's Signature

## 2012-01-21 NOTE — Patient Instructions (Addendum)
Doing well.  Proceed with chemo.  We will add you on for fluids tomorrow and next Wednesday.  Please call us if you have any questions or concerns.

## 2012-01-21 NOTE — Progress Notes (Signed)
OFFICE PROGRESS NOTE  CC  Marie Mire, MD [address] [place] [address] Dr. Sigmund Hazel Dr. Emelia Loron Dr. Lurline Hare  DIAGNOSIS: 66 year old female with invasive mammary carcinoma of the left breast ER positive PR positive HER-2/neu positive with Ki-67 94%.  PRIOR THERAPY:  #1 patient was originally seen in the multidisciplinary breast clinic on 12/16/2011. She was diagnosed with stage II a HER-2 positive ER/PR positive breast cancer of the left breast at the 3:00 position. She also had a second area that was ER/PR positive but HER-2/neu negative. Patient is interested in breast conservation.  #2 patient is planning on receiving neoadjuvant chemotherapy consisting of perjeta Herceptin and Taxotere given every 21 days.  CURRENT THERAPY: Neoadjuvant perjeta/herceptin/taxotere with neulasta support Cycle 2 Day 1  INTERVAL HISTORY: Marie Anderson 66 y.o. female returns for followup visit. She is feeling well.  She had some diarrhea during her off week of her chemotherapy cycle, and had an episode of SVT that didn't respond to valsava, or carotid massage, so she went to the ER, got 1L of IVF and adenosine, and converted back to NSR.  She's feeling well today, and is ready to proceed with chemo.  She continues to have very mild numbness in her fingertips with no motor defect noted.    MEDICAL HISTORY: Past Medical History  Diagnosis Date  . Anemia   . SVT (supraventricular tachycardia)   . Breast cancer     ALLERGIES:  is allergic to cephalosporins and codeine.  MEDICATIONS:  Current Outpatient Prescriptions  Medication Sig Dispense Refill  . Ascorbic Acid (VITAMIN C PO) Take 500 mg by mouth daily.       Marland Kitchen CALCIUM PO Take 1,200 mg by mouth daily.       . Cholecalciferol (VITAMIN D PO) Take 500 mg by mouth daily.       . ciprofloxacin (CIPRO) 500 MG tablet Take 1 tablet (500 mg total) by mouth 2 (two) times daily.   14 tablet  6  . Cyanocobalamin (VITAMIN B 12 PO) Take 3,000 mg by mouth daily.       Marland Kitchen dexamethasone (DECADRON) 4 MG tablet Take 2 tabs two times a day the day before Taxotere chemo. Then take 2 tabs daily starting the day after chemo for 2 days.  30 tablet  1  . IRON PO Take 65 mg by mouth daily.       Marland Kitchen lidocaine-prilocaine (EMLA) cream Apply topically as needed. Apply to port as directed  30 g  6  . LORazepam (ATIVAN) 0.5 MG tablet Take 1 tablet (0.5 mg total) by mouth every 6 (six) hours as needed (Nausea or vomiting).  30 tablet  0  . metoprolol succinate (TOPROL-XL) 100 MG 24 hr tablet Take 1 tablet (100 mg total) by mouth daily.  90 tablet  1  . ondansetron (ZOFRAN) 8 MG tablet Take 1 tablet two times a day for 2 days starting the day after chemo, then take two times a day as needed for nausea or vomiting.  30 tablet  1  . oxyCODONE-acetaminophen (ROXICET) 5-325 MG per tablet Take 1 tablet by mouth every 4 (four) hours as needed for pain.  10 tablet  0  . prochlorperazine (COMPAZINE) 10 MG tablet Take 1 tablet (10 mg total) by mouth every 6 (six) hours as needed (Nausea or vomiting).  30 tablet  1  . prochlorperazine (COMPAZINE) 25 MG suppository Place 1 suppository (25  mg total) rectally every 12 (twelve) hours as needed for nausea.  12 suppository  3    SURGICAL HISTORY:  Past Surgical History  Procedure Date  . Dilation and curettage of uterus   . Eye surgery     age 42-lt eye growth  . Colonoscopy   . Portacath placement 12/23/2011    Procedure: INSERTION PORT-A-CATH;  Surgeon: Emelia Loron, MD;  Location: [facility];  Service: General;  Laterality: Right;    REVIEW OF SYSTEMS:   General: fatigue (-), night sweats (-), fever (-), pain (-) Lymph: palpable nodes (-) HEENT: vision changes (-), mucositis (-), gum bleeding (-), epistaxis (-) Cardiovascular: chest pain (-), palpitations (-) Pulmonary: shortness of breath (-), dyspnea on exertion (-), cough (-),  hemoptysis (-) GI:  Early satiety (-), melena (-), dysphagia (-), nausea/vomiting (-), diarrhea (-) GU: dysuria (-), hematuria (-), incontinence (-) Musculoskeletal: joint swelling (-), joint pain (-), back pain (-) Neuro: weakness (-), numbness (-), headache (-), confusion (-) Skin: Rash (-), lesions (-), dryness (-) Psych: depression (-), suicidal/homicidal ideation (-), feeling of hopelessness (-)   PHYSICAL EXAMINATION: Blood pressure 131/80, pulse 84, temperature 98.5 F (36.9 C), temperature source Oral, resp. rate 20, height 5\' 5"  (1.651 m), weight 199 lb 12.8 oz (90.629 kg). Body mass index is 33.25 kg/(m^2). General: Patient is a well appearing female in no acute distress HEENT: PERRLA, sclerae anicteric no conjunctival pallor, MMM Neck: supple, no palpable adenopathy Lungs: clear to auscultation bilaterally, no wheezes, rhonchi, or rales Cardiovascular: regular rate rhythm, S1, S2, no murmurs, rubs or gallops Abdomen: Soft, non-tender, non-distended, normoactive bowel sounds, no HSM Extremities: warm and well perfused, no clubbing, cyanosis, or edema Skin: No rashes or lesions Neuro: Non-focal Left breast does reveal a palpable mass at the 3:00 position a second breast mass is noted at about the 10:00 position. ECOG PERFORMANCE STATUS: 0 - Asymptomatic  LABORATORY DATA: Lab Results  Component Value Date   WBC 5.5 01/21/2012   HGB 12.5 01/21/2012   HCT 35.9 01/21/2012   MCV 97.8 01/21/2012   PLT 107* 01/21/2012      Chemistry      Component Value Date/Time   NA 140 01/08/2012 1921   NA 139 01/05/2012 1252   K 3.4* 01/08/2012 1921   K 3.5 01/05/2012 1252   CL 102 01/08/2012 1921   CL 104 01/05/2012 1252   CO2 29 01/05/2012 1252   BUN 12 01/08/2012 1921   BUN 14.0 01/05/2012 1252   CREATININE 1.00 01/08/2012 1921   CREATININE 0.7 01/05/2012 1252      Component Value Date/Time   CALCIUM 9.4 01/05/2012 1252   ALKPHOS 79 01/05/2012 1252   AST 14 01/05/2012 1252   ALT 25  01/05/2012 1252   BILITOT 1.29* 01/05/2012 1252       RADIOGRAPHIC STUDIES:  US Breast Left  12/14/2011  *RADIOLOGY REPORT*  Clinical Data:  Abnormal MRI, the second look left breast ultrasound  LEFT BREAST ULTRASOUND  Comparison:  MRI dated 12/11/2011  On physical exam, I palpate normal tissue in the 3 o'clock position of the left breast, 3 cm from the nipple.  Findings: Ultrasound is performed, showing an oval heterogeneously hypoechoic mass with microlobulated margins in the 3 o'clock position, 3 cm the nipple measuring 1.2 x 0.7 x 1.1 cm.  This corresponds to the area of concern on recent MRI.  IMPRESSION: Mass, left breast which biopsy is done and reported separately.  RECOMMENDATION: Ultrasound guided left breast biopsy.  BI-RADS CATEGORY 4:  Suspicious abnormality - biopsy should be considered.   Original Report Authenticated By: Hiram Gash, M.D.    US Breast Left  12/01/2011  *RADIOLOGY REPORT*  Clinical Data:  66 year old female with palpable lump in the upper left breast discovered on self examination.  Also for annual bilateral mammograms.  DIGITAL DIAGNOSTIC BILATERAL MAMMOGRAM WITH CAD AND LEFT BREAST ULTRASOUND:  Comparison:  03/18/2009 mammograms.  Findings:  A spot compression view of the left breast and routine views of both breasts again demonstrate heterogeneously dense breast tissue. Within the upper left breast, an irregular mass with pleomorphic and linear calcifications is identified.  These calcifications span a length of 2 cm. No other mass, distortion or suspicious calcifications are identified bilaterally. Mammographic images were processed with CAD.  On physical exam, a firm mass is identified at the 12 o'clock position of the left breast 5 cm from the nipple.  Ultrasound is performed, showing a 10 x 9 x 8 mm irregular hypoechoic mass at the 12 o'clock position of the left breast 5 cm from the nipple.  A 3 mm satellite nodule lies 3 mm superiomedial to the dominant  mass. No definite enlarged abnormal appearing left axillary lymph nodes are identified.  IMPRESSION: 10 x 9 x 8 mm irregular mass in the upper left breast identified sonographically which likely represents the mammographic finding, as well as an adjacent 3 mm satellite nodule.  These findings are very worrisome for malignancy and tissue sampling is recommended.  These findings were discussed with the patient and her questions answered.  She was encouraged to begin/continue monthly self exams and to contact her primary physician if any changes noted.  BI-RADS CATEGORY 5:  Highly suggestive of malignancy - appropriate action should be taken.  RECOMMENDATION: Ultrasound guided left breast biopsy.  This has been scheduled for 12/07/2011 and the patient informed.   Original Report Authenticated By: Rosendo Gros, M.D.    Mr Breast Bilateral W Wo Contrast  12/11/2011  *RADIOLOGY REPORT*  Clinical Data: Recently biopsy-proven left breast invasive ductal carcinoma manifesting as a palpable left breast mass 12 o'clock location.  BILATERAL BREAST MRI WITH AND WITHOUT CONTRAST  Technique: Multiplanar, multisequence MR images of both breasts were obtained prior to and following the intravenous administration of 20ml of Multihance.  Three dimensional images were evaluated at the independent DynaCad workstation.  Comparison:  Prior mammograms and ultrasound  Findings: Post biopsy change is noted in the left breast 12 o'clock location as well as clip artifact corresponding to the biopsy- proven breast cancer.  No other abnormal T2-weighted hyperintensity on either side.  No lymphadenopathy.  Background parenchymal enhancement type pattern is mild.  In the left breast 12 o'clock location is an irregular spiculated enhancing mass with plateau type kinetics measuring 2.7 x 2.2 x 2.0 cm.  This corresponds to the biopsy-proven breast cancer.  In the left breast three o'clock location, there is a mildly irregular enhancing mass  plateau type enhancing kinetics measuring 1.1 x 1.1 x 1.0 cm.  No dominant area of abnormal enhancement seen in the right breast.  IMPRESSION: Abnormal enhancing mass left breast 12 o'clock location at the site of biopsy-proven breast cancer.  A second abnormally enhancing mass in the left breast in the 3 o'clock location is identified; second look ultrasound is recommended for further evaluation and possible ultrasound-guided biopsy.  If no sonographic correlate is identified, MRI guided core biopsy would be recommended.  No evidence for malignancy in the right  breast.  RECOMMENDATION: BI-RADS CATEGORY 4:  Suspicious abnormality - biopsy should be considered.  THREE-DIMENSIONAL MR IMAGE RENDERING ON INDEPENDENT WORKSTATION:  Three-dimensional MR images were rendered by post-processing of the original MR data on an independent workstation.  The three- dimensional MR images were interpreted, and findings were reported in the accompanying complete MRI report for this study.  We will contact the patient to arrange for follow-up ultrasound.   Original Report Authenticated By: Harrel Lemon, M.D.    [place] Core Biopsy  12/17/2011  **ADDENDUM** CREATED: 12/15/2011 12:01:32  Histologic evaluation demonstrates invasive and in situ mammary carcinoma.  Grade II ductal carcinoma is favored.  This is concordant with the imaging findings.  Results were discussed with the patient by telephone at her request.  She reports no complications from the procedure.  She is scheduled to be seen in the [facility] on 12/16/2011.  **END ADDENDUM** SIGNED BY: Cain Saupe, M.D.   12/15/2011  **ADDENDUM** CREATED: 12/15/2011 12:01:32  Histologic evaluation demonstrates invasive and in situ mammary carcinoma.  Grade II ductal carcinoma is favored.  This is concordant with the imaging findings.  Results were discussed with the patient by telephone at her request.  She reports no complications from the  procedure.  She is scheduled to be seen in the [facility] on 12/16/2011.  **END ADDENDUM** SIGNED BY: Cain Saupe, M.D.   12/14/2011  *RADIOLOGY REPORT*  Clinical Data:  New diagnosis left sided breast cancer.  Abnormal MRI.  ULTRASOUND GUIDED CORE BIOPSY OF THE left BREAST  Comparison: Previous exams.  I met with the patient and we discussed the procedure of ultrasound- guided biopsy, including benefits and alternatives.  We discussed the high likelihood of a successful procedure. We discussed the risks of the procedure, including infection, bleeding, tissue injury, clip migration, and inadequate sampling.  Informed written consent was given.  Using sterile technique 2% lidocaine, ultrasound guidance and a 14 gauge automated biopsy device, biopsy was performed of the mass using a  lateral approach.  At the conclusion of the procedure a wing tissue marker clip was deployed into the biopsy cavity. Follow up 2 view mammogram was performed and dictated separately.  IMPRESSION: Ultrasound guided biopsy of a left breast mass.  No apparent complications.  Original Report Authenticated By: Daryl Eastern, M.D.    [place] Core Biopsy  12/04/2011  *RADIOLOGY REPORT*  Clinical Data:  Suspicious left breast mass 12 o'clock location  ULTRASOUND GUIDED VACUUM ASSISTED CORE BIOPSY OF THE LEFT BREAST  Comparison: Previous exams.  I met with the patient and we discussed the procedure of ultrasound- guided biopsy, including benefits and alternatives.  We discussed the high likelihood of a successful procedure. We discussed the risks of the procedure including infection, bleeding, tissue injury, clip migration, and inadequate sampling.  Informed written consent was given.  Using sterile technique, 2% lidocaine ultrasound guidance and a 12 gauge vacuum assisted needle biopsy was performed of left breast mass 12 o'clock location using a lateral to medial approach.  At the conclusion of the  procedure, a ribbon shaped tissue marker clip was deployed into the biopsy cavity.  Follow-up 2-view mammogram was performed and dictated separately.  IMPRESSION:  Ultrasound-guided biopsy of left breast mass 12 o'clock location with ribbon shaped clip placement.  Pathology is pending.  No apparent complications.   Original Report Authenticated By: Harrel Lemon, M.D.    Dg Chest Port 1 View  12/23/2011  *RADIOLOGY  REPORT*  Clinical Data: Postop for Port-A-Cath placement.  Rule out pneumothorax.  PORTABLE CHEST - 1 VIEW  Comparison: 11/03/2005  Findings: Right-sided Port-A-Cath terminates at the mid to low SVC. No pneumothorax.  There is biapical pleural thickening.  Midline trachea.  Normal heart size and mediastinal contours.  No pleural fluid. Clear lungs.  IMPRESSION: New right-sided Port-A-Cath, without pneumothorax.   Original Report Authenticated By: Consuello Bossier, M.D.    Mm Digital Diagnostic Bilat  12/01/2011  *RADIOLOGY REPORT*  Clinical Data:  66 year old female with palpable lump in the upper left breast discovered on self examination.  Also for annual bilateral mammograms.  DIGITAL DIAGNOSTIC BILATERAL MAMMOGRAM WITH CAD AND LEFT BREAST ULTRASOUND:  Comparison:  03/18/2009 mammograms.  Findings:  A spot compression view of the left breast and routine views of both breasts again demonstrate heterogeneously dense breast tissue. Within the upper left breast, an irregular mass with pleomorphic and linear calcifications is identified.  These calcifications span a length of 2 cm. No other mass, distortion or suspicious calcifications are identified bilaterally. Mammographic images were processed with CAD.  On physical exam, a firm mass is identified at the 12 o'clock position of the left breast 5 cm from the nipple.  Ultrasound is performed, showing a 10 x 9 x 8 mm irregular hypoechoic mass at the 12 o'clock position of the left breast 5 cm from the nipple.  A 3 mm satellite nodule lies 3 mm  superiomedial to the dominant mass. No definite enlarged abnormal appearing left axillary lymph nodes are identified.  IMPRESSION: 10 x 9 x 8 mm irregular mass in the upper left breast identified sonographically which likely represents the mammographic finding, as well as an adjacent 3 mm satellite nodule.  These findings are very worrisome for malignancy and tissue sampling is recommended.  These findings were discussed with the patient and her questions answered.  She was encouraged to begin/continue monthly self exams and to contact her primary physician if any changes noted.  BI-RADS CATEGORY 5:  Highly suggestive of malignancy - appropriate action should be taken.  RECOMMENDATION: Ultrasound guided left breast biopsy.  This has been scheduled for 12/07/2011 and the patient informed.   Original Report Authenticated By: Rosendo Gros, M.D.    Mm Digital Diagnostic Unilat L  12/14/2011  *RADIOLOGY REPORT*  Clinical Data:  Known malignancy, left breast.  Ultrasound core biopsy for an area seen on recent MRI.  DIGITAL DIAGNOSTIC LEFT MAMMOGRAM  Comparison:  Previous exams.  Findings:  Films are performed following ultrasound guided biopsy of a left breast mass.  Images show a wing type clip in association with the mass seen sonographically in the 3 o'clock position of the left breast.  The clip is 6.8 cm lateral and inferior to the known malignancy.  IMPRESSION: Adequate clip placement following ultrasound guided left breast biopsy.   Original Report Authenticated By: Hiram Gash, M.D.    Mm Digital Diagnostic Unilat L  12/04/2011  *RADIOLOGY REPORT*  Clinical Data:  Ultrasound-guided left breast biopsy with clip placement  DIGITAL DIAGNOSTIC LEFT MAMMOGRAM  Comparison:  Previous exams.  Findings:  Films are performed following ultrasound guided biopsy of left breast mass 12 o'clock location.  The ribbon shaped clip is appropriately located at the site of the mammographically evident mass in the 12  o'clock location with associated calcifications.  IMPRESSION: Appropriate ribbon shaped clip location at the site of the biopsied left breast 12 o'clock location mass.   Original Report Authenticated By:  Harrel Lemon, M.D.    [facility] Radiologist Eval And Mgmt  12/07/2011  *RADIOLOGY REPORT*  Clinical Data: Post biopsy evaluation  CONSULTATION  Comparison: December 01, 2011, March 18, 2009  Findings: The pathology revealed invasive ductal carcinoma and ductal carcinoma ins situ.  This is found to be concordant with imaging findings.  I discussed the results with the the patient. The biopsy site is clean.  There is no complications.  The patient states she is doing well without problems post biopsy.  The patient has appointment for [facility] clinic on December 16, 2011.  The patient has appointment for MRI of the breasts on December 11, 2011.  IMPRESSION: Post biopsy consultation as described.  Recommend MRI of the breasts and and  [facility] Clinic as described.   Original Report Authenticated By: Sherian Rein, M.D.    Dg Fluoro Guide Cv Line-no Report  12/23/2011  CLINICAL DATA: port placement   FLOURO GUIDE CV LINE  Fluoroscopy was utilized by the requesting physician.  No radiographic  interpretation.     ASSESSMENT: 66 year old female with new diagnosis of  #1 stage II invasive mammary carcinoma of the left breast that is ER positive PR positive HER-2/neu positive with Ki-67 94%. Patient is interested in breast conservation. There for she will begin neoadjuvant chemotherapy consisting of perjeta, Herceptin, and Taxotere. This will be given every 21 days for a total of 4 cycles. After that we will plan on getting MRI of the breasts performed to evaluate response.  #2 patient will get Port-A-Cath placement chemotherapy teaching class as well as an echocardiogram.  #3 she will continue to be seen by Dr. Peter [place] for on going cardiac surveillance and she is receiving Her2 based therapy.  #4 SVT  PLAN:   #1  Ms. Kenward is doing well today.  She will proceed with chemotherapy  #2 Due to her recent SVT, we will be more vigilant about hydration with this cycle, I have ordered NS 1L today, tomorrow, and next Wednesday to prevent any complications.    #3 We will see her back on 11/27 for interim labs and IVF.    All questions were answered. The patient knows to call the clinic with any problems, questions or concerns. We can certainly see the patient much sooner if necessary.  I spent 25 minutes counseling the patient face to face. The total time spent in the appointment was 30 minutes.  This case was reviewed with Dr. Welton Flakes.   Cherie Ouch Lyn Hollingshead, NP Medical Oncology [facility] Phone: [phone]

## 2012-01-22 ENCOUNTER — Ambulatory Visit (HOSPITAL_BASED_OUTPATIENT_CLINIC_OR_DEPARTMENT_OTHER): Payer: BC Managed Care – PPO

## 2012-01-22 ENCOUNTER — Ambulatory Visit: Payer: BC Managed Care – PPO

## 2012-01-22 VITALS — BP 132/81 | HR 74 | Temp 97.6°F | Resp 20

## 2012-01-22 DIAGNOSIS — C50219 Malignant neoplasm of upper-inner quadrant of unspecified female breast: Secondary | ICD-10-CM

## 2012-01-22 DIAGNOSIS — E860 Dehydration: Secondary | ICD-10-CM

## 2012-01-22 DIAGNOSIS — Z5189 Encounter for other specified aftercare: Secondary | ICD-10-CM

## 2012-01-22 MED ORDER — PEGFILGRASTIM INJECTION 6 MG/0.6ML
6.0000 mg | Freq: Once | SUBCUTANEOUS | Status: AC
  Administered 2012-01-22: 6 mg via SUBCUTANEOUS
  Filled 2012-01-22: qty 0.6

## 2012-01-22 MED ORDER — SODIUM CHLORIDE 0.9 % IV SOLN
Freq: Once | INTRAVENOUS | Status: AC
  Administered 2012-01-22: 12:00:00 via INTRAVENOUS

## 2012-01-22 MED ORDER — HEPARIN SOD (PORK) LOCK FLUSH 100 UNIT/ML IV SOLN
500.0000 [IU] | Freq: Once | INTRAVENOUS | Status: AC
  Administered 2012-01-22: 500 [IU] via INTRAVENOUS
  Filled 2012-01-22: qty 5

## 2012-01-22 MED ORDER — SODIUM CHLORIDE 0.9 % IJ SOLN
10.0000 mL | INTRAMUSCULAR | Status: DC | PRN
  Administered 2012-01-22: 10 mL via INTRAVENOUS
  Filled 2012-01-22: qty 10

## 2012-01-22 NOTE — Patient Instructions (Addendum)
[facility] Discharge Instructions for Patients Receiving Chemotherapy  Today you received the following chemotherapy agents :  IVF  To help prevent nausea and vomiting after your treatment, we encourage you to take your nausea medication as instructed by your physician.    If you develop nausea and vomiting that is not controlled by your nausea medication, call the clinic. If it is after clinic hours your family physician or the after hours number for the clinic or go to the Emergency Department.   BELOW ARE SYMPTOMS THAT SHOULD BE REPORTED IMMEDIATELY:  *FEVER GREATER THAN 100.5 F  *CHILLS WITH OR WITHOUT FEVER  NAUSEA AND VOMITING THAT IS NOT CONTROLLED WITH YOUR NAUSEA MEDICATION  *UNUSUAL SHORTNESS OF BREATH  *UNUSUAL BRUISING OR BLEEDING  TENDERNESS IN MOUTH AND THROAT WITH OR WITHOUT PRESENCE OF ULCERS  *URINARY PROBLEMS  *BOWEL PROBLEMS  UNUSUAL RASH Items with * indicate a potential emergency and should be followed up as soon as possible.  One of the nurses will contact you 24 hours after your treatment. Please let the nurse know about any problems that you may have experienced. Feel free to call the clinic you have any questions or concerns. The clinic phone number is [phone].   I have been informed and understand all the instructions given to me. I know to contact the clinic, my physician, or go to the Emergency Department if any problems should occur. I do not have any questions at this time, but understand that I may call the clinic during office hours   should I have any questions or need assistance in obtaining follow up care.    __________________________________________  _____________  __________ Signature of Patient or Authorized Representative            Date                   Time    __________________________________________ Nurse's Signature

## 2012-01-25 ENCOUNTER — Other Ambulatory Visit: Payer: Self-pay | Admitting: Emergency Medicine

## 2012-01-25 ENCOUNTER — Telehealth: Payer: Self-pay | Admitting: *Deleted

## 2012-01-25 NOTE — Telephone Encounter (Signed)
move patient to 12:15 on 11/27 per L Alexander; and call patient with appointment times  Left voice message to inform the patient of the new date and time on 01-27-2012  [facility] email to adjust treatment

## 2012-01-25 NOTE — Telephone Encounter (Signed)
Per staff message and POF I have adjusted appt. JMW  

## 2012-01-26 ENCOUNTER — Other Ambulatory Visit: Payer: BC Managed Care – PPO | Admitting: Lab

## 2012-01-27 ENCOUNTER — Other Ambulatory Visit: Payer: BC Managed Care – PPO | Admitting: Lab

## 2012-01-27 ENCOUNTER — Encounter: Payer: Self-pay | Admitting: Adult Health

## 2012-01-27 ENCOUNTER — Ambulatory Visit (HOSPITAL_BASED_OUTPATIENT_CLINIC_OR_DEPARTMENT_OTHER): Payer: BC Managed Care – PPO

## 2012-01-27 ENCOUNTER — Ambulatory Visit (HOSPITAL_BASED_OUTPATIENT_CLINIC_OR_DEPARTMENT_OTHER): Payer: BC Managed Care – PPO | Admitting: Adult Health

## 2012-01-27 VITALS — BP 125/75 | HR 87

## 2012-01-27 VITALS — BP 128/77 | HR 91 | Temp 99.3°F | Resp 20 | Ht 65.0 in | Wt 198.1 lb

## 2012-01-27 DIAGNOSIS — R5381 Other malaise: Secondary | ICD-10-CM

## 2012-01-27 DIAGNOSIS — C50219 Malignant neoplasm of upper-inner quadrant of unspecified female breast: Secondary | ICD-10-CM

## 2012-01-27 DIAGNOSIS — D702 Other drug-induced agranulocytosis: Secondary | ICD-10-CM

## 2012-01-27 DIAGNOSIS — E860 Dehydration: Secondary | ICD-10-CM

## 2012-01-27 DIAGNOSIS — C50919 Malignant neoplasm of unspecified site of unspecified female breast: Secondary | ICD-10-CM

## 2012-01-27 DIAGNOSIS — R52 Pain, unspecified: Secondary | ICD-10-CM

## 2012-01-27 LAB — CBC WITH DIFFERENTIAL/PLATELET
Basophils Absolute: 0 10*3/uL (ref 0.0–0.1)
HCT: 36.9 % (ref 34.8–46.6)
HGB: 12.2 g/dL (ref 11.6–15.9)
MCH: 33.2 pg (ref 25.1–34.0)
MONO#: 0.5 10*3/uL (ref 0.1–0.9)
NEUT%: 38.5 % (ref 38.4–76.8)
Platelets: 94 10*3/uL — ABNORMAL LOW (ref 145–400)
WBC: 2.5 10*3/uL — ABNORMAL LOW (ref 3.9–10.3)
lymph#: 1 10*3/uL (ref 0.9–3.3)

## 2012-01-27 LAB — COMPREHENSIVE METABOLIC PANEL (CC13)
AST: 15 U/L (ref 5–34)
BUN: 11 mg/dL (ref 7.0–26.0)
Calcium: 9.1 mg/dL (ref 8.4–10.4)
Chloride: 102 mEq/L (ref 98–107)
Creatinine: 0.7 mg/dL (ref 0.6–1.1)
Total Bilirubin: 1.27 mg/dL — ABNORMAL HIGH (ref 0.20–1.20)

## 2012-01-27 MED ORDER — SODIUM CHLORIDE 0.9 % IV SOLN
Freq: Once | INTRAVENOUS | Status: AC
Start: 2012-01-27 — End: 2012-01-27
  Administered 2012-01-27: 13:00:00 via INTRAVENOUS

## 2012-01-27 MED ORDER — OXYCODONE-ACETAMINOPHEN 5-325 MG PO TABS: 1.0000 | ORAL_TABLET | ORAL | Status: DC | PRN

## 2012-01-27 NOTE — Progress Notes (Signed)
OFFICE PROGRESS NOTE  CC  Juluis Mire, MD [address], [address] [place] [address] Dr. Sigmund Hazel Dr. Emelia Loron Dr. Lurline Hare  DIAGNOSIS: 66 year old female with invasive mammary carcinoma of the left breast ER positive PR positive HER-2/neu positive with Ki-67 94%.  PRIOR THERAPY:  #1 patient was originally seen in the multidisciplinary breast clinic on 12/16/2011. She was diagnosed with stage II a HER-2 positive ER/PR positive breast cancer of the left breast at the 3:00 position. She also had a second area that was ER/PR positive but HER-2/neu negative. Patient is interested in breast conservation.  #2 patient is planning on receiving neoadjuvant chemotherapy consisting of perjeta Herceptin and Taxotere given every 21 days.  CURRENT THERAPY: Neoadjuvant perjeta/herceptin/taxotere with neulasta support Cycle 2 Day 8  INTERVAL HISTORY: Marie Anderson 66 y.o. female returns for followup visit. She is feeling puny today.  Mild numbness on toes, and severe bony pain that didn't allow her to sleep last night despite taking Tylenol.  She has mild mouth pain and taste changes and she's fatigued.   She's without any fevers or chills, and doing well otherwise.    MEDICAL HISTORY: Past Medical History  Diagnosis Date  . Anemia   . SVT (supraventricular tachycardia)   . Breast cancer     ALLERGIES:  is allergic to cephalosporins and codeine.  MEDICATIONS:  Current Outpatient Prescriptions  Medication Sig Dispense Refill  . Ascorbic Acid (VITAMIN C PO) Take 500 mg by mouth daily.       Marland Kitchen CALCIUM PO Take 1,200 mg by mouth daily.       . Cholecalciferol (VITAMIN D PO) Take 500 mg by mouth daily.       . ciprofloxacin (CIPRO) 500 MG tablet Take 1 tablet (500 mg total) by mouth 2 (two) times daily.  14 tablet  6  . Cyanocobalamin (VITAMIN B 12 PO) Take 3,000 mg by mouth daily.       Marland Kitchen dexamethasone (DECADRON) 4 MG tablet Take 2  tabs two times a day the day before Taxotere chemo. Then take 2 tabs daily starting the day after chemo for 2 days.  30 tablet  1  . IRON PO Take 65 mg by mouth daily.       Marland Kitchen lidocaine-prilocaine (EMLA) cream Apply topically as needed. Apply to port as directed  30 g  6  . LORazepam (ATIVAN) 0.5 MG tablet Take 1 tablet (0.5 mg total) by mouth every 6 (six) hours as needed (Nausea or vomiting).  30 tablet  0  . metoprolol succinate (TOPROL-XL) 100 MG 24 hr tablet Take 1 tablet (100 mg total) by mouth daily.  90 tablet  1  . ondansetron (ZOFRAN) 8 MG tablet Take 1 tablet two times a day for 2 days starting the day after chemo, then take two times a day as needed for nausea or vomiting.  30 tablet  1  . oxyCODONE-acetaminophen (ROXICET) 5-325 MG per tablet Take 1 tablet by mouth every 4 (four) hours as needed for pain.  10 tablet  0  . prochlorperazine (COMPAZINE) 10 MG tablet Take 1 tablet (10 mg total) by mouth every 6 (six) hours as needed (Nausea or vomiting).  30 tablet  1  . prochlorperazine (COMPAZINE) 25 MG suppository Place 1 suppository (25 mg total) rectally every 12 (twelve) hours as needed for nausea.  12 suppository  3    SURGICAL HISTORY:  Past Surgical History  Procedure  Date  . Dilation and curettage of uterus   . Eye surgery     age 57-lt eye growth  . Colonoscopy   . Portacath placement 12/23/2011    Procedure: INSERTION PORT-A-CATH;  Surgeon: Emelia Loron, MD;  Location: [facility];  Service: General;  Laterality: Right;    REVIEW OF SYSTEMS:   General: fatigue (+), night sweats (-), fever (-), pain (+) Lymph: palpable nodes (-) HEENT: vision changes (-), mucositis (+), gum bleeding (-), epistaxis (-) Cardiovascular: chest pain (-), palpitations (-) Pulmonary: shortness of breath (-), dyspnea on exertion (-), cough (-), hemoptysis (-) GI:  Early satiety (-), melena (-), dysphagia (-), nausea/vomiting (+), diarrhea (-) GU: dysuria (-), hematuria (-),  incontinence (-) Musculoskeletal: joint swelling (-), joint pain (-), back pain (-) Neuro: weakness (-), numbness (-), headache (-), confusion (-) Skin: Rash (-), lesions (-), dryness (-) Psych: depression (-), suicidal/homicidal ideation (-), feeling of hopelessness (-)   PHYSICAL EXAMINATION: Blood pressure 128/77, pulse 91, temperature 99.3 F (37.4 C), temperature source Oral, resp. rate 20, height 5\' 5"  (1.651 m), weight 198 lb 1.6 oz (89.858 kg). Body mass index is 32.97 kg/(m^2). General: Patient is a well appearing female in no acute distress HEENT: PERRLA, sclerae anicteric no conjunctival pallor, erythematous mucous membranes Neck: supple, no palpable adenopathy Lungs: clear to auscultation bilaterally, no wheezes, rhonchi, or rales Cardiovascular: regular rate rhythm, S1, S2, no murmurs, rubs or gallops Abdomen: Soft, non-tender, non-distended, normoactive bowel sounds, no HSM Extremities: warm and well perfused, no clubbing, cyanosis, or edema Skin: No rashes or lesions Neuro: Non-focal Left breast does reveal a palpable mass at the 3:00 position a second breast mass is noted at about the 10:00 position. ECOG PERFORMANCE STATUS: 0 - Asymptomatic  LABORATORY DATA: Lab Results  Component Value Date   WBC 2.5* 01/27/2012   HGB 12.2 01/27/2012   HCT 36.9 01/27/2012   MCV 100.5 01/27/2012   PLT 94* 01/27/2012  ANC 900    Chemistry      Component Value Date/Time   NA 141 01/21/2012 1104   NA 140 01/08/2012 1921   K 4.2 01/21/2012 1104   K 3.4* 01/08/2012 1921   CL 108* 01/21/2012 1104   CL 102 01/08/2012 1921   CO2 27 01/21/2012 1104   BUN 12.0 01/21/2012 1104   BUN 12 01/08/2012 1921   CREATININE 0.8 01/21/2012 1104   CREATININE 1.00 01/08/2012 1921      Component Value Date/Time   CALCIUM 9.7 01/21/2012 1104   ALKPHOS 69 01/21/2012 1104   AST 15 01/21/2012 1104   ALT 18 01/21/2012 1104   BILITOT 0.50 01/21/2012 1104       RADIOGRAPHIC STUDIES:  US Breast  Left  12/14/2011  *RADIOLOGY REPORT*  Clinical Data:  Abnormal MRI, the second look left breast ultrasound  LEFT BREAST ULTRASOUND  Comparison:  MRI dated 12/11/2011  On physical exam, I palpate normal tissue in the 3 o'clock position of the left breast, 3 cm from the nipple.  Findings: Ultrasound is performed, showing an oval heterogeneously hypoechoic mass with microlobulated margins in the 3 o'clock position, 3 cm the nipple measuring 1.2 x 0.7 x 1.1 cm.  This corresponds to the area of concern on recent MRI.  IMPRESSION: Mass, left breast which biopsy is done and reported separately.  RECOMMENDATION: Ultrasound guided left breast biopsy.  BI-RADS CATEGORY 4:  Suspicious abnormality - biopsy should be considered.   Original Report Authenticated By: Hiram Gash, M.D.  US Breast Left  12/01/2011  *RADIOLOGY REPORT*  Clinical Data:  66 year old female with palpable lump in the upper left breast discovered on self examination.  Also for annual bilateral mammograms.  DIGITAL DIAGNOSTIC BILATERAL MAMMOGRAM WITH CAD AND LEFT BREAST ULTRASOUND:  Comparison:  03/18/2009 mammograms.  Findings:  A spot compression view of the left breast and routine views of both breasts again demonstrate heterogeneously dense breast tissue. Within the upper left breast, an irregular mass with pleomorphic and linear calcifications is identified.  These calcifications span a length of 2 cm. No other mass, distortion or suspicious calcifications are identified bilaterally. Mammographic images were processed with CAD.  On physical exam, a firm mass is identified at the 12 o'clock position of the left breast 5 cm from the nipple.  Ultrasound is performed, showing a 10 x 9 x 8 mm irregular hypoechoic mass at the 12 o'clock position of the left breast 5 cm from the nipple.  A 3 mm satellite nodule lies 3 mm superiomedial to the dominant mass. No definite enlarged abnormal appearing left axillary lymph nodes are identified.   IMPRESSION: 10 x 9 x 8 mm irregular mass in the upper left breast identified sonographically which likely represents the mammographic finding, as well as an adjacent 3 mm satellite nodule.  These findings are very worrisome for malignancy and tissue sampling is recommended.  These findings were discussed with the patient and her questions answered.  She was encouraged to begin/continue monthly self exams and to contact her primary physician if any changes noted.  BI-RADS CATEGORY 5:  Highly suggestive of malignancy - appropriate action should be taken.  RECOMMENDATION: Ultrasound guided left breast biopsy.  This has been scheduled for 12/07/2011 and the patient informed.   Original Report Authenticated By: Rosendo Gros, M.D.    Mr Breast Bilateral W Wo Contrast  12/11/2011  *RADIOLOGY REPORT*  Clinical Data: Recently biopsy-proven left breast invasive ductal carcinoma manifesting as a palpable left breast mass 12 o'clock location.  BILATERAL BREAST MRI WITH AND WITHOUT CONTRAST  Technique: Multiplanar, multisequence MR images of both breasts were obtained prior to and following the intravenous administration of 20ml of Multihance.  Three dimensional images were evaluated at the independent DynaCad workstation.  Comparison:  Prior mammograms and ultrasound  Findings: Post biopsy change is noted in the left breast 12 o'clock location as well as clip artifact corresponding to the biopsy- proven breast cancer.  No other abnormal T2-weighted hyperintensity on either side.  No lymphadenopathy.  Background parenchymal enhancement type pattern is mild.  In the left breast 12 o'clock location is an irregular spiculated enhancing mass with plateau type kinetics measuring 2.7 x 2.2 x 2.0 cm.  This corresponds to the biopsy-proven breast cancer.  In the left breast three o'clock location, there is a mildly irregular enhancing mass plateau type enhancing kinetics measuring 1.1 x 1.1 x 1.0 cm.  No dominant area of abnormal  enhancement seen in the right breast.  IMPRESSION: Abnormal enhancing mass left breast 12 o'clock location at the site of biopsy-proven breast cancer.  A second abnormally enhancing mass in the left breast in the 3 o'clock location is identified; second look ultrasound is recommended for further evaluation and possible ultrasound-guided biopsy.  If no sonographic correlate is identified, MRI guided core biopsy would be recommended.  No evidence for malignancy in the right breast.  RECOMMENDATION: BI-RADS CATEGORY 4:  Suspicious abnormality - biopsy should be considered.  THREE-DIMENSIONAL MR IMAGE RENDERING ON INDEPENDENT WORKSTATION:  Three-dimensional  MR images were rendered by post-processing of the original MR data on an independent workstation.  The three- dimensional MR images were interpreted, and findings were reported in the accompanying complete MRI report for this study.  We will contact the patient to arrange for follow-up ultrasound.   Original Report Authenticated By: Harrel Lemon, M.D.    [place] Core Biopsy  12/17/2011  **ADDENDUM** CREATED: 12/15/2011 12:01:32  Histologic evaluation demonstrates invasive and in situ mammary carcinoma.  Grade II ductal carcinoma is favored.  This is concordant with the imaging findings.  Results were discussed with the patient by telephone at her request.  She reports no complications from the procedure.  She is scheduled to be seen in the [facility] on 12/16/2011.  **END ADDENDUM** SIGNED BY: Cain Saupe, M.D.   12/15/2011  **ADDENDUM** CREATED: 12/15/2011 12:01:32  Histologic evaluation demonstrates invasive and in situ mammary carcinoma.  Grade II ductal carcinoma is favored.  This is concordant with the imaging findings.  Results were discussed with the patient by telephone at her request.  She reports no complications from the procedure.  She is scheduled to be seen in the [facility] on 12/16/2011.  **END ADDENDUM** SIGNED BY: Cain Saupe, M.D.   12/14/2011  *RADIOLOGY REPORT*  Clinical Data:  New diagnosis left sided breast cancer.  Abnormal MRI.  ULTRASOUND GUIDED CORE BIOPSY OF THE left BREAST  Comparison: Previous exams.  I met with the patient and we discussed the procedure of ultrasound- guided biopsy, including benefits and alternatives.  We discussed the high likelihood of a successful procedure. We discussed the risks of the procedure, including infection, bleeding, tissue injury, clip migration, and inadequate sampling.  Informed written consent was given.  Using sterile technique 2% lidocaine, ultrasound guidance and a 14 gauge automated biopsy device, biopsy was performed of the mass using a  lateral approach.  At the conclusion of the procedure a wing tissue marker clip was deployed into the biopsy cavity. Follow up 2 view mammogram was performed and dictated separately.  IMPRESSION: Ultrasound guided biopsy of a left breast mass.  No apparent complications.  Original Report Authenticated By: Daryl Eastern, M.D.    [place] Core Biopsy  12/04/2011  *RADIOLOGY REPORT*  Clinical Data:  Suspicious left breast mass 12 o'clock location  ULTRASOUND GUIDED VACUUM ASSISTED CORE BIOPSY OF THE LEFT BREAST  Comparison: Previous exams.  I met with the patient and we discussed the procedure of ultrasound- guided biopsy, including benefits and alternatives.  We discussed the high likelihood of a successful procedure. We discussed the risks of the procedure including infection, bleeding, tissue injury, clip migration, and inadequate sampling.  Informed written consent was given.  Using sterile technique, 2% lidocaine ultrasound guidance and a 12 gauge vacuum assisted needle biopsy was performed of left breast mass 12 o'clock location using a lateral to medial approach.  At the conclusion of the procedure, a ribbon shaped tissue marker clip was deployed into the biopsy cavity.   Follow-up 2-view mammogram was performed and dictated separately.  IMPRESSION:  Ultrasound-guided biopsy of left breast mass 12 o'clock location with ribbon shaped clip placement.  Pathology is pending.  No apparent complications.   Original Report Authenticated By: Harrel Lemon, M.D.    Dg Chest Port 1 View  12/23/2011  *RADIOLOGY REPORT*  Clinical Data: Postop for Port-A-Cath placement.  Rule out pneumothorax.  PORTABLE CHEST - 1 VIEW  Comparison: 11/03/2005  Findings: Right-sided  Port-A-Cath terminates at the mid to low SVC. No pneumothorax.  There is biapical pleural thickening.  Midline trachea.  Normal heart size and mediastinal contours.  No pleural fluid. Clear lungs.  IMPRESSION: New right-sided Port-A-Cath, without pneumothorax.   Original Report Authenticated By: Consuello Bossier, M.D.    Mm Digital Diagnostic Bilat  12/01/2011  *RADIOLOGY REPORT*  Clinical Data:  66 year old female with palpable lump in the upper left breast discovered on self examination.  Also for annual bilateral mammograms.  DIGITAL DIAGNOSTIC BILATERAL MAMMOGRAM WITH CAD AND LEFT BREAST ULTRASOUND:  Comparison:  03/18/2009 mammograms.  Findings:  A spot compression view of the left breast and routine views of both breasts again demonstrate heterogeneously dense breast tissue. Within the upper left breast, an irregular mass with pleomorphic and linear calcifications is identified.  These calcifications span a length of 2 cm. No other mass, distortion or suspicious calcifications are identified bilaterally. Mammographic images were processed with CAD.  On physical exam, a firm mass is identified at the 12 o'clock position of the left breast 5 cm from the nipple.  Ultrasound is performed, showing a 10 x 9 x 8 mm irregular hypoechoic mass at the 12 o'clock position of the left breast 5 cm from the nipple.  A 3 mm satellite nodule lies 3 mm superiomedial to the dominant mass. No definite enlarged abnormal appearing left  axillary lymph nodes are identified.  IMPRESSION: 10 x 9 x 8 mm irregular mass in the upper left breast identified sonographically which likely represents the mammographic finding, as well as an adjacent 3 mm satellite nodule.  These findings are very worrisome for malignancy and tissue sampling is recommended.  These findings were discussed with the patient and her questions answered.  She was encouraged to begin/continue monthly self exams and to contact her primary physician if any changes noted.  BI-RADS CATEGORY 5:  Highly suggestive of malignancy - appropriate action should be taken.  RECOMMENDATION: Ultrasound guided left breast biopsy.  This has been scheduled for 12/07/2011 and the patient informed.   Original Report Authenticated By: Rosendo Gros, M.D.    Mm Digital Diagnostic Unilat L  12/14/2011  *RADIOLOGY REPORT*  Clinical Data:  Known malignancy, left breast.  Ultrasound core biopsy for an area seen on recent MRI.  DIGITAL DIAGNOSTIC LEFT MAMMOGRAM  Comparison:  Previous exams.  Findings:  Films are performed following ultrasound guided biopsy of a left breast mass.  Images show a wing type clip in association with the mass seen sonographically in the 3 o'clock position of the left breast.  The clip is 6.8 cm lateral and inferior to the known malignancy.  IMPRESSION: Adequate clip placement following ultrasound guided left breast biopsy.   Original Report Authenticated By: Hiram Gash, M.D.    Mm Digital Diagnostic Unilat L  12/04/2011  *RADIOLOGY REPORT*  Clinical Data:  Ultrasound-guided left breast biopsy with clip placement  DIGITAL DIAGNOSTIC LEFT MAMMOGRAM  Comparison:  Previous exams.  Findings:  Films are performed following ultrasound guided biopsy of left breast mass 12 o'clock location.  The ribbon shaped clip is appropriately located at the site of the mammographically evident mass in the 12 o'clock location with associated calcifications.  IMPRESSION: Appropriate ribbon  shaped clip location at the site of the biopsied left breast 12 o'clock location mass.   Original Report Authenticated By: Harrel Lemon, M.D.    Mm Radiologist Eval And Mgmt  12/07/2011  *RADIOLOGY REPORT*  Clinical Data: Post biopsy evaluation  CONSULTATION  Comparison: December 01, 2011, March 18, 2009  Findings: The pathology revealed invasive ductal carcinoma and ductal carcinoma ins situ.  This is found to be concordant with imaging findings.  I discussed the results with the the patient. The biopsy site is clean.  There is no complications.  The patient states she is doing well without problems post biopsy.  The patient has appointment for [facility] on December 16, 2011.  The patient has appointment for MRI of the breasts on December 11, 2011.  IMPRESSION: Post biopsy consultation as described.  Recommend MRI of the breasts and and  [facility] as described.   Original Report Authenticated By: Sherian Rein, M.D.    Dg Fluoro Guide Cv Line-no Report  12/23/2011  CLINICAL DATA: port placement   FLOURO GUIDE CV LINE  Fluoroscopy was utilized by the requesting physician.  No radiographic  interpretation.     ASSESSMENT: 66 year old female with new diagnosis of  #1 stage II invasive mammary carcinoma of the left breast that is ER positive PR positive HER-2/neu positive with Ki-67 94%. Patient is interested in breast conservation. There for she will begin neoadjuvant chemotherapy consisting of perjeta, Herceptin, and Taxotere. This will be given every 21 days for a total of 4 cycles. After that we will plan on getting MRI of the breasts performed to evaluate response.  #2 patient will get Port-A-Cath placement chemotherapy teaching class as well as an echocardiogram.  #3 she will continue to be seen by Dr. Peter [place] for on going cardiac surveillance and she is receiving Her2 based therapy.  #4 SVT  #5 Neutropenia  PLAN:   #1 Ms. Strom feels puny due to her chemo.  She's neutropenic.  I  gave her detailed instructions along with having her get her Cipro refilled.  She had diarrhea with the cipro last time, so she's going to take immodium twice a day if needed for it.    #2 Her HR is stable.  She will proceed with IV fluids today.  I refilled her roxicet for her pain.     #3 We will see her back on 12/12 for interim labs and IVF.    All questions were answered. The patient knows to call the clinic with any problems, questions or concerns. We can certainly see the patient much sooner if necessary.  I spent 25 minutes counseling the patient face to face. The total time spent in the appointment was 30 minutes.  This case was reviewed with Dr. Welton Flakes.   Cherie Ouch Lyn Hollingshead, NP Medical Oncology [facility] Phone: [phone]

## 2012-01-27 NOTE — Patient Instructions (Addendum)
Dehydration, Adult Dehydration is when you lose more fluids from the body than you take in. Vital organs like the kidneys, brain, and heart cannot function without a proper amount of fluids and salt. Any loss of fluids from the body can cause dehydration.  CAUSES   Vomiting.  Diarrhea.  Excessive sweating.  Excessive urine output.  Fever. SYMPTOMS  Mild dehydration  Thirst.  Dry lips.  Slightly dry mouth. Moderate dehydration  Very dry mouth.  Sunken eyes.  Skin does not bounce back quickly when lightly pinched and released.  Dark urine and decreased urine production.  Decreased tear production.  Headache. Severe dehydration  Very dry mouth.  Extreme thirst.  Rapid, weak pulse (more than 100 beats per minute at rest).  Cold hands and feet.  Not able to sweat in spite of heat and temperature.  Rapid breathing.  Blue lips.  Confusion and lethargy.  Difficulty being awakened.  Minimal urine production.  No tears. DIAGNOSIS  Your caregiver will diagnose dehydration based on your symptoms and your exam. Blood and urine tests will help confirm the diagnosis. The diagnostic evaluation should also identify the cause of dehydration. TREATMENT  Treatment of mild or moderate dehydration can often be done at home by increasing the amount of fluids that you drink. It is best to drink small amounts of fluid more often. Drinking too much at one time can make vomiting worse. Refer to the home care instructions below. Severe dehydration needs to be treated at the hospital where you will probably be given intravenous (IV) fluids that contain water and electrolytes. HOME CARE INSTRUCTIONS   Ask your caregiver about specific rehydration instructions.  Drink enough fluids to keep your urine clear or pale yellow.  Drink small amounts frequently if you have nausea and vomiting.  Eat as you normally do.  Avoid:  Foods or drinks high in sugar.  Carbonated  drinks.  Juice.  Extremely hot or cold fluids.  Drinks with caffeine.  Fatty, greasy foods.  Alcohol.  Tobacco.  Overeating.  Gelatin desserts.  Wash your hands well to avoid spreading bacteria and viruses.  Only take over-the-counter or prescription medicines for pain, discomfort, or fever as directed by your caregiver.  Ask your caregiver if you should continue all prescribed and over-the-counter medicines.  Keep all follow-up appointments with your caregiver. SEEK MEDICAL CARE IF:  You have abdominal pain and it increases or stays in one area (localizes).  You have a rash, stiff neck, or severe headache.  You are irritable, sleepy, or difficult to awaken.  You are weak, dizzy, or extremely thirsty. SEEK IMMEDIATE MEDICAL CARE IF:   You are unable to keep fluids down or you get worse despite treatment.  You have frequent episodes of vomiting or diarrhea.  You have blood or green matter (bile) in your vomit.  You have blood in your stool or your stool looks black and tarry.  You have not urinated in 6 to 8 hours, or you have only urinated a small amount of very dark urine.  You have a fever.  You faint. MAKE SURE YOU:   Understand these instructions.  Will watch your condition.  Will get help right away if you are not doing well or get worse. Document Released: 02/16/2005 Document Revised: 05/11/2011 Document Reviewed: 10/06/2010 [employer] Patient Information 2013 [employer].  

## 2012-01-27 NOTE — Patient Instructions (Addendum)
  Patient Neutropenia Instruction Sheet  Diagnosis: Breast Cancer      Treating Physician: Drue Second, MD  Treatment: 1. Type of chemotherapy: Taxotere Herceptin Perjeta 2. Date of last treatment: 11/21  Last Blood Counts:      Prophylactic Antibiotics: Cipro 500 mg by mouth twice a day Instructions: 1. Monitor temperature and call if fever  greater than 100.5, chills, shaking chills (rigors) 2. Call Physician on-call at [phone] 3. Give him/her symptoms and list of medications that you are taking and your last blood count.  I have prescribed pain medication for your back pain to take if you need it.  Please call us if you have any questions or concerns.

## 2012-01-29 ENCOUNTER — Ambulatory Visit: Payer: BC Managed Care – PPO | Admitting: Adult Health

## 2012-01-29 ENCOUNTER — Other Ambulatory Visit: Payer: BC Managed Care – PPO | Admitting: Lab

## 2012-02-02 ENCOUNTER — Other Ambulatory Visit: Payer: BC Managed Care – PPO | Admitting: Lab

## 2012-02-09 ENCOUNTER — Ambulatory Visit: Payer: BC Managed Care – PPO

## 2012-02-09 ENCOUNTER — Other Ambulatory Visit: Payer: BC Managed Care – PPO | Admitting: Lab

## 2012-02-11 ENCOUNTER — Other Ambulatory Visit (HOSPITAL_BASED_OUTPATIENT_CLINIC_OR_DEPARTMENT_OTHER): Payer: BC Managed Care – PPO | Admitting: Lab

## 2012-02-11 ENCOUNTER — Telehealth: Payer: Self-pay | Admitting: *Deleted

## 2012-02-11 ENCOUNTER — Ambulatory Visit (HOSPITAL_BASED_OUTPATIENT_CLINIC_OR_DEPARTMENT_OTHER): Payer: BC Managed Care – PPO | Admitting: Adult Health

## 2012-02-11 ENCOUNTER — Ambulatory Visit (HOSPITAL_BASED_OUTPATIENT_CLINIC_OR_DEPARTMENT_OTHER): Payer: BC Managed Care – PPO

## 2012-02-11 ENCOUNTER — Encounter: Payer: Self-pay | Admitting: Adult Health

## 2012-02-11 VITALS — BP 117/72 | HR 94 | Temp 98.4°F | Resp 20 | Ht 65.0 in | Wt 197.6 lb

## 2012-02-11 DIAGNOSIS — E860 Dehydration: Secondary | ICD-10-CM

## 2012-02-11 DIAGNOSIS — C50919 Malignant neoplasm of unspecified site of unspecified female breast: Secondary | ICD-10-CM

## 2012-02-11 DIAGNOSIS — Z5112 Encounter for antineoplastic immunotherapy: Secondary | ICD-10-CM

## 2012-02-11 DIAGNOSIS — C50219 Malignant neoplasm of upper-inner quadrant of unspecified female breast: Secondary | ICD-10-CM

## 2012-02-11 DIAGNOSIS — Z170 Estrogen receptor positive status [ER+]: Secondary | ICD-10-CM

## 2012-02-11 DIAGNOSIS — Z5111 Encounter for antineoplastic chemotherapy: Secondary | ICD-10-CM

## 2012-02-11 LAB — CBC WITH DIFFERENTIAL/PLATELET
Basophils Absolute: 0 10*3/uL (ref 0.0–0.1)
EOS%: 0 % (ref 0.0–7.0)
HCT: 31.4 % — ABNORMAL LOW (ref 34.8–46.6)
HGB: 10.3 g/dL — ABNORMAL LOW (ref 11.6–15.9)
LYMPH%: 14 % (ref 14.0–49.7)
MCH: 32.5 pg (ref 25.1–34.0)
MCV: 99.1 fL (ref 79.5–101.0)
MONO%: 5.2 % (ref 0.0–14.0)
NEUT%: 80.6 % — ABNORMAL HIGH (ref 38.4–76.8)

## 2012-02-11 LAB — COMPREHENSIVE METABOLIC PANEL (CC13)
Albumin: 3.7 g/dL (ref 3.5–5.0)
Alkaline Phosphatase: 63 U/L (ref 40–150)
BUN: 9 mg/dL (ref 7.0–26.0)
Glucose: 107 mg/dl — ABNORMAL HIGH (ref 70–99)
Potassium: 4.2 mEq/L (ref 3.5–5.1)
Total Bilirubin: 0.57 mg/dL (ref 0.20–1.20)

## 2012-02-11 MED ORDER — DIPHENHYDRAMINE HCL 25 MG PO CAPS
50.0000 mg | ORAL_CAPSULE | Freq: Once | ORAL | Status: AC
  Administered 2012-02-11: 50 mg via ORAL

## 2012-02-11 MED ORDER — DOCETAXEL CHEMO INJECTION 160 MG/16ML
75.0000 mg/m2 | Freq: Once | INTRAVENOUS | Status: AC
  Administered 2012-02-11: 160 mg via INTRAVENOUS
  Filled 2012-02-11: qty 16

## 2012-02-11 MED ORDER — ONDANSETRON 8 MG/50ML IVPB (CHCC)
8.0000 mg | Freq: Once | INTRAVENOUS | Status: AC
  Administered 2012-02-11: 8 mg via INTRAVENOUS

## 2012-02-11 MED ORDER — SODIUM CHLORIDE 0.9 % IJ SOLN
10.0000 mL | INTRAMUSCULAR | Status: DC | PRN
  Administered 2012-02-11: 10 mL
  Filled 2012-02-11: qty 10

## 2012-02-11 MED ORDER — LORAZEPAM 2 MG/ML IJ SOLN
0.5000 mg | Freq: Once | INTRAMUSCULAR | Status: AC
  Administered 2012-02-11: 0.5 mg via INTRAVENOUS

## 2012-02-11 MED ORDER — DEXAMETHASONE SODIUM PHOSPHATE 10 MG/ML IJ SOLN
10.0000 mg | Freq: Once | INTRAMUSCULAR | Status: AC
  Administered 2012-02-11: 10 mg via INTRAVENOUS

## 2012-02-11 MED ORDER — SODIUM CHLORIDE 0.9 % IV SOLN
Freq: Once | INTRAVENOUS | Status: AC
  Administered 2012-02-11: 13:00:00 via INTRAVENOUS

## 2012-02-11 MED ORDER — TRASTUZUMAB CHEMO INJECTION 440 MG
6.0000 mg/kg | Freq: Once | INTRAVENOUS | Status: AC
  Administered 2012-02-11: 567 mg via INTRAVENOUS
  Filled 2012-02-11: qty 27

## 2012-02-11 MED ORDER — HEPARIN SOD (PORK) LOCK FLUSH 100 UNIT/ML IV SOLN
500.0000 [IU] | Freq: Once | INTRAVENOUS | Status: AC | PRN
  Administered 2012-02-11: 500 [IU]
  Filled 2012-02-11: qty 5

## 2012-02-11 MED ORDER — SODIUM CHLORIDE 0.9 % IV SOLN
420.0000 mg | Freq: Once | INTRAVENOUS | Status: AC
  Administered 2012-02-11: 420 mg via INTRAVENOUS
  Filled 2012-02-11: qty 14

## 2012-02-11 MED ORDER — ACETAMINOPHEN 325 MG PO TABS
650.0000 mg | ORAL_TABLET | Freq: Once | ORAL | Status: AC
  Administered 2012-02-11: 650 mg via ORAL

## 2012-02-11 NOTE — Progress Notes (Signed)
OFFICE PROGRESS NOTE  CC  Juluis Mire, MD [address], [address], [address] [place] [address] Dr. Sigmund Hazel Dr. Emelia Loron Dr. Lurline Hare  DIAGNOSIS: 66 year old female with invasive mammary carcinoma of the left breast ER positive PR positive HER-2/neu positive with Ki-67 94%.  PRIOR THERAPY:  #1 patient was originally seen in the multidisciplinary breast clinic on 12/16/2011. She was diagnosed with stage II a HER-2 positive ER/PR positive breast cancer of the left breast at the 3:00 position. She also had a second area that was ER/PR positive but HER-2/neu negative. Patient is interested in breast conservation.  #2 patient is planning on receiving neoadjuvant chemotherapy consisting of perjeta Herceptin and Taxotere given every 21 days.  CURRENT THERAPY: Neoadjuvant perjeta/herceptin/taxotere with neulasta support Cycle 3 Day 1  INTERVAL HISTORY: Marie Anderson 66 y.o. female returns for followup visit.  She's doing well today.  She is without any issues.  She doesn't have any pain, numbness, tingling, and she is eating well.  She is otherwise feeling well and without questions/concerns.   MEDICAL HISTORY: Past Medical History  Diagnosis Date  . Anemia   . SVT (supraventricular tachycardia)   . Breast cancer     ALLERGIES:  is allergic to cephalosporins and codeine.  MEDICATIONS:  Current Outpatient Prescriptions  Medication Sig Dispense Refill  . dexamethasone (DECADRON) 4 MG tablet Take 2 tabs two times a day the day before Taxotere chemo. Then take 2 tabs daily starting the day after chemo for 2 days.  30 tablet  1  . lidocaine-prilocaine (EMLA) cream Apply topically as needed. Apply to port as directed  30 g  6  . loratadine (CLARITIN) 10 MG tablet Take 10 mg by mouth daily.      Marland Kitchen LORazepam (ATIVAN) 0.5 MG tablet Take 1 tablet (0.5 mg total) by mouth every 6 (six) hours as needed (Nausea or vomiting).  30 tablet  0  .  metoprolol succinate (TOPROL-XL) 100 MG 24 hr tablet Take 1 tablet (100 mg total) by mouth daily.  90 tablet  1  . ondansetron (ZOFRAN) 8 MG tablet Take 1 tablet two times a day for 2 days starting the day after chemo, then take two times a day as needed for nausea or vomiting.  30 tablet  1  . oxyCODONE-acetaminophen (ROXICET) 5-325 MG per tablet Take 1 tablet by mouth every 4 (four) hours as needed for pain.  30 tablet  0  . prochlorperazine (COMPAZINE) 10 MG tablet Take 1 tablet (10 mg total) by mouth every 6 (six) hours as needed (Nausea or vomiting).  30 tablet  1  . prochlorperazine (COMPAZINE) 25 MG suppository Place 1 suppository (25 mg total) rectally every 12 (twelve) hours as needed for nausea.  12 suppository  3  . Ascorbic Acid (VITAMIN C PO) Take 500 mg by mouth daily.       Marland Kitchen CALCIUM PO Take 1,200 mg by mouth daily.       . Cholecalciferol (VITAMIN D PO) Take 500 mg by mouth daily.       . ciprofloxacin (CIPRO) 500 MG tablet Take 1 tablet (500 mg total) by mouth 2 (two) times daily.  14 tablet  6  . Cyanocobalamin (VITAMIN B 12 PO) Take 3,000 mg by mouth daily.       . IRON PO Take 65 mg by mouth daily.        No current facility-administered medications for this visit.  Facility-Administered Medications Ordered in Other Visits  Medication Dose Route Frequency Provider Last Rate Last Dose  . [COMPLETED] 0.9 %  sodium chloride infusion   Intravenous Once Victorino December, MD 20 mL/hr at 02/11/12 1232    . [COMPLETED] acetaminophen (TYLENOL) tablet 650 mg  650 mg Oral Once Victorino December, MD   650 mg at 02/11/12 1232  . dexamethasone (DECADRON) injection 10 mg  10 mg Intravenous Once Victorino December, MD      . [address] diphenhydrAMINE (BENADRYL) capsule 50 mg  50 mg Oral Once Victorino December, MD   50 mg at 02/11/12 1232  . DOCEtaxel (TAXOTERE) 160 mg in dextrose 5 % 250 mL chemo infusion  75 mg/m2 (Treatment Plan Actual) Intravenous Once Victorino December, MD      . heparin lock flush  100 unit/mL  500 Units Intracatheter Once PRN Victorino December, MD      . LORazepam (ATIVAN) injection 0.5 mg  0.5 mg Intravenous Once Augustin Schooling, NP      . ondansetron (ZOFRAN) IVPB 8 mg  8 mg Intravenous Once Victorino December, MD      . pertuzumab (PERJETA) 420 mg in sodium chloride 0.9 % 250 mL chemo infusion  420 mg Intravenous Once Victorino December, MD 528 mL/hr at 02/11/12 1322 420 mg at 02/11/12 1322  . sodium chloride 0.9 % injection 10 mL  10 mL Intracatheter PRN Victorino December, MD      . [address] trastuzumab (HERCEPTIN) 567 mg in sodium chloride 0.9 % 250 mL chemo infusion  6 mg/kg (Treatment Plan Actual) Intravenous Once Victorino December, MD 554 mL/hr at 02/11/12 1249 567 mg at 02/11/12 1249    SURGICAL HISTORY:  Past Surgical History  Procedure Date  . Dilation and curettage of uterus   . Eye surgery     age 12-lt eye growth  . Colonoscopy   . Portacath placement 12/23/2011    Procedure: INSERTION PORT-A-CATH;  Surgeon: Emelia Loron, MD;  Location: [facility];  Service: General;  Laterality: Right;    REVIEW OF SYSTEMS:   General: fatigue (-), night sweats (-), fever (-), pain (-) Lymph: palpable nodes (-) HEENT: vision changes (-), mucositis (-), gum bleeding (-), epistaxis (-) Cardiovascular: chest pain (-), palpitations (-) Pulmonary: shortness of breath (-), dyspnea on exertion (-), cough (-), hemoptysis (-) GI:  Early satiety (-), melena (-), dysphagia (-), nausea/vomiting (-), diarrhea (-) GU: dysuria (-), hematuria (-), incontinence (-) Musculoskeletal: joint swelling (-), joint pain (-), back pain (-) Neuro: weakness (-), numbness (-), headache (-), confusion (-) Skin: Rash (-), lesions (-), dryness (-) Psych: depression (-), suicidal/homicidal ideation (-), feeling of hopelessness (-)   PHYSICAL EXAMINATION: Blood pressure 117/72, pulse 94, temperature 98.4 F (36.9 C), temperature source Oral, resp. rate 20, height 5\' 5"  (1.651 m), weight  197 lb 9.6 oz (89.631 kg). Body mass index is 32.88 kg/(m^2). General: Patient is a well appearing female in no acute distress HEENT: PERRLA, sclerae anicteric no conjunctival pallor, moist mucous membranes Neck: supple, no palpable adenopathy Lungs: clear to auscultation bilaterally, no wheezes, rhonchi, or rales Cardiovascular: regular rate rhythm, S1, S2, no murmurs, rubs or gallops Abdomen: Soft, non-tender, non-distended, normoactive bowel sounds, no HSM Extremities: warm and well perfused, no clubbing, cyanosis, or edema Skin: No rashes or lesions Neuro: Non-focal Left breast does reveal a palpable mass at the 3:00 position a second breast mass is noted at about the 10:00 position. ECOG  PERFORMANCE STATUS: 0 - Asymptomatic  LABORATORY DATA: Lab Results  Component Value Date   WBC 4.6 02/11/2012   HGB 10.3* 02/11/2012   HCT 31.4* 02/11/2012   MCV 99.1 02/11/2012   PLT 76* 02/11/2012  ANC 900    Chemistry      Component Value Date/Time   NA 144 02/11/2012 1042   NA 140 01/08/2012 1921   K 4.2 02/11/2012 1042   K 3.4* 01/08/2012 1921   CL 109* 02/11/2012 1042   CL 102 01/08/2012 1921   CO2 26 02/11/2012 1042   BUN 9.0 02/11/2012 1042   BUN 12 01/08/2012 1921   CREATININE 0.7 02/11/2012 1042   CREATININE 1.00 01/08/2012 1921      Component Value Date/Time   CALCIUM 9.4 02/11/2012 1042   ALKPHOS 63 02/11/2012 1042   AST 17 02/11/2012 1042   ALT 17 02/11/2012 1042   BILITOT 0.57 02/11/2012 1042       RADIOGRAPHIC STUDIES:  US Breast Left  12/14/2011  *RADIOLOGY REPORT*  Clinical Data:  Abnormal MRI, the second look left breast ultrasound  LEFT BREAST ULTRASOUND  Comparison:  MRI dated 12/11/2011  On physical exam, I palpate normal tissue in the 3 o'clock position of the left breast, 3 cm from the nipple.  Findings: Ultrasound is performed, showing an oval heterogeneously hypoechoic mass with microlobulated margins in the 3 o'clock position, 3 cm the nipple measuring 1.2  x 0.7 x 1.1 cm.  This corresponds to the area of concern on recent MRI.  IMPRESSION: Mass, left breast which biopsy is done and reported separately.  RECOMMENDATION: Ultrasound guided left breast biopsy.  BI-RADS CATEGORY 4:  Suspicious abnormality - biopsy should be considered.   Original Report Authenticated By: Hiram Gash, M.D.    US Breast Left  12/01/2011  *RADIOLOGY REPORT*  Clinical Data:  66 year old female with palpable lump in the upper left breast discovered on self examination.  Also for annual bilateral mammograms.  DIGITAL DIAGNOSTIC BILATERAL MAMMOGRAM WITH CAD AND LEFT BREAST ULTRASOUND:  Comparison:  03/18/2009 mammograms.  Findings:  A spot compression view of the left breast and routine views of both breasts again demonstrate heterogeneously dense breast tissue. Within the upper left breast, an irregular mass with pleomorphic and linear calcifications is identified.  These calcifications span a length of 2 cm. No other mass, distortion or suspicious calcifications are identified bilaterally. Mammographic images were processed with CAD.  On physical exam, a firm mass is identified at the 12 o'clock position of the left breast 5 cm from the nipple.  Ultrasound is performed, showing a 10 x 9 x 8 mm irregular hypoechoic mass at the 12 o'clock position of the left breast 5 cm from the nipple.  A 3 mm satellite nodule lies 3 mm superiomedial to the dominant mass. No definite enlarged abnormal appearing left axillary lymph nodes are identified.  IMPRESSION: 10 x 9 x 8 mm irregular mass in the upper left breast identified sonographically which likely represents the mammographic finding, as well as an adjacent 3 mm satellite nodule.  These findings are very worrisome for malignancy and tissue sampling is recommended.  These findings were discussed with the patient and her questions answered.  She was encouraged to begin/continue monthly self exams and to contact her primary physician if any  changes noted.  BI-RADS CATEGORY 5:  Highly suggestive of malignancy - appropriate action should be taken.  RECOMMENDATION: Ultrasound guided left breast biopsy.  This has been scheduled for 12/07/2011 and  the patient informed.   Original Report Authenticated By: Rosendo Gros, M.D.    Mr Breast Bilateral W Wo Contrast  12/11/2011  *RADIOLOGY REPORT*  Clinical Data: Recently biopsy-proven left breast invasive ductal carcinoma manifesting as a palpable left breast mass 12 o'clock location.  BILATERAL BREAST MRI WITH AND WITHOUT CONTRAST  Technique: Multiplanar, multisequence MR images of both breasts were obtained prior to and following the intravenous administration of 20ml of Multihance.  Three dimensional images were evaluated at the independent DynaCad workstation.  Comparison:  Prior mammograms and ultrasound  Findings: Post biopsy change is noted in the left breast 12 o'clock location as well as clip artifact corresponding to the biopsy- proven breast cancer.  No other abnormal T2-weighted hyperintensity on either side.  No lymphadenopathy.  Background parenchymal enhancement type pattern is mild.  In the left breast 12 o'clock location is an irregular spiculated enhancing mass with plateau type kinetics measuring 2.7 x 2.2 x 2.0 cm.  This corresponds to the biopsy-proven breast cancer.  In the left breast three o'clock location, there is a mildly irregular enhancing mass plateau type enhancing kinetics measuring 1.1 x 1.1 x 1.0 cm.  No dominant area of abnormal enhancement seen in the right breast.  IMPRESSION: Abnormal enhancing mass left breast 12 o'clock location at the site of biopsy-proven breast cancer.  A second abnormally enhancing mass in the left breast in the 3 o'clock location is identified; second look ultrasound is recommended for further evaluation and possible ultrasound-guided biopsy.  If no sonographic correlate is identified, MRI guided core biopsy would be recommended.  No evidence for  malignancy in the right breast.  RECOMMENDATION: BI-RADS CATEGORY 4:  Suspicious abnormality - biopsy should be considered.  THREE-DIMENSIONAL MR IMAGE RENDERING ON INDEPENDENT WORKSTATION:  Three-dimensional MR images were rendered by post-processing of the original MR data on an independent workstation.  The three- dimensional MR images were interpreted, and findings were reported in the accompanying complete MRI report for this study.  We will contact the patient to arrange for follow-up ultrasound.   Original Report Authenticated By: Harrel Lemon, M.D.    [place] Core Biopsy  12/17/2011  **ADDENDUM** CREATED: 12/15/2011 12:01:32  Histologic evaluation demonstrates invasive and in situ mammary carcinoma.  Grade II ductal carcinoma is favored.  This is concordant with the imaging findings.  Results were discussed with the patient by telephone at her request.  She reports no complications from the procedure.  She is scheduled to be seen in the [facility] on 12/16/2011.  **END ADDENDUM** SIGNED BY: Cain Saupe, M.D.   12/15/2011  **ADDENDUM** CREATED: 12/15/2011 12:01:32  Histologic evaluation demonstrates invasive and in situ mammary carcinoma.  Grade II ductal carcinoma is favored.  This is concordant with the imaging findings.  Results were discussed with the patient by telephone at her request.  She reports no complications from the procedure.  She is scheduled to be seen in the [facility] on 12/16/2011.  **END ADDENDUM** SIGNED BY: Cain Saupe, M.D.   12/14/2011  *RADIOLOGY REPORT*  Clinical Data:  New diagnosis left sided breast cancer.  Abnormal MRI.  ULTRASOUND GUIDED CORE BIOPSY OF THE left BREAST  Comparison: Previous exams.  I met with the patient and we discussed the procedure of ultrasound- guided biopsy, including benefits and alternatives.  We discussed the high likelihood of a successful procedure. We discussed  the risks of the procedure, including infection, bleeding, tissue injury, clip migration, and  inadequate sampling.  Informed written consent was given.  Using sterile technique 2% lidocaine, ultrasound guidance and a 14 gauge automated biopsy device, biopsy was performed of the mass using a  lateral approach.  At the conclusion of the procedure a wing tissue marker clip was deployed into the biopsy cavity. Follow up 2 view mammogram was performed and dictated separately.  IMPRESSION: Ultrasound guided biopsy of a left breast mass.  No apparent complications.  Original Report Authenticated By: Daryl Eastern, M.D.    [place] Core Biopsy  12/04/2011  *RADIOLOGY REPORT*  Clinical Data:  Suspicious left breast mass 12 o'clock location  ULTRASOUND GUIDED VACUUM ASSISTED CORE BIOPSY OF THE LEFT BREAST  Comparison: Previous exams.  I met with the patient and we discussed the procedure of ultrasound- guided biopsy, including benefits and alternatives.  We discussed the high likelihood of a successful procedure. We discussed the risks of the procedure including infection, bleeding, tissue injury, clip migration, and inadequate sampling.  Informed written consent was given.  Using sterile technique, 2% lidocaine ultrasound guidance and a 12 gauge vacuum assisted needle biopsy was performed of left breast mass 12 o'clock location using a lateral to medial approach.  At the conclusion of the procedure, a ribbon shaped tissue marker clip was deployed into the biopsy cavity.  Follow-up 2-view mammogram was performed and dictated separately.  IMPRESSION:  Ultrasound-guided biopsy of left breast mass 12 o'clock location with ribbon shaped clip placement.  Pathology is pending.  No apparent complications.   Original Report Authenticated By: Harrel Lemon, M.D.    Dg Chest Port 1 View  12/23/2011  *RADIOLOGY REPORT*  Clinical Data: Postop for Port-A-Cath placement.  Rule out pneumothorax.  PORTABLE CHEST - 1 VIEW   Comparison: 11/03/2005  Findings: Right-sided Port-A-Cath terminates at the mid to low SVC. No pneumothorax.  There is biapical pleural thickening.  Midline trachea.  Normal heart size and mediastinal contours.  No pleural fluid. Clear lungs.  IMPRESSION: New right-sided Port-A-Cath, without pneumothorax.   Original Report Authenticated By: Consuello Bossier, M.D.    Mm Digital Diagnostic Bilat  12/01/2011  *RADIOLOGY REPORT*  Clinical Data:  66 year old female with palpable lump in the upper left breast discovered on self examination.  Also for annual bilateral mammograms.  DIGITAL DIAGNOSTIC BILATERAL MAMMOGRAM WITH CAD AND LEFT BREAST ULTRASOUND:  Comparison:  03/18/2009 mammograms.  Findings:  A spot compression view of the left breast and routine views of both breasts again demonstrate heterogeneously dense breast tissue. Within the upper left breast, an irregular mass with pleomorphic and linear calcifications is identified.  These calcifications span a length of 2 cm. No other mass, distortion or suspicious calcifications are identified bilaterally. Mammographic images were processed with CAD.  On physical exam, a firm mass is identified at the 12 o'clock position of the left breast 5 cm from the nipple.  Ultrasound is performed, showing a 10 x 9 x 8 mm irregular hypoechoic mass at the 12 o'clock position of the left breast 5 cm from the nipple.  A 3 mm satellite nodule lies 3 mm superiomedial to the dominant mass. No definite enlarged abnormal appearing left axillary lymph nodes are identified.  IMPRESSION: 10 x 9 x 8 mm irregular mass in the upper left breast identified sonographically which likely represents the mammographic finding, as well as an adjacent 3 mm satellite nodule.  These findings are very worrisome for malignancy and tissue sampling is recommended.  These findings were discussed with the patient and  her questions answered.  She was encouraged to begin/continue monthly self exams and to  contact her primary physician if any changes noted.  BI-RADS CATEGORY 5:  Highly suggestive of malignancy - appropriate action should be taken.  RECOMMENDATION: Ultrasound guided left breast biopsy.  This has been scheduled for 12/07/2011 and the patient informed.   Original Report Authenticated By: Rosendo Gros, M.D.    Mm Digital Diagnostic Unilat L  12/14/2011  *RADIOLOGY REPORT*  Clinical Data:  Known malignancy, left breast.  Ultrasound core biopsy for an area seen on recent MRI.  DIGITAL DIAGNOSTIC LEFT MAMMOGRAM  Comparison:  Previous exams.  Findings:  Films are performed following ultrasound guided biopsy of a left breast mass.  Images show a wing type clip in association with the mass seen sonographically in the 3 o'clock position of the left breast.  The clip is 6.8 cm lateral and inferior to the known malignancy.  IMPRESSION: Adequate clip placement following ultrasound guided left breast biopsy.   Original Report Authenticated By: Hiram Gash, M.D.    Mm Digital Diagnostic Unilat L  12/04/2011  *RADIOLOGY REPORT*  Clinical Data:  Ultrasound-guided left breast biopsy with clip placement  DIGITAL DIAGNOSTIC LEFT MAMMOGRAM  Comparison:  Previous exams.  Findings:  Films are performed following ultrasound guided biopsy of left breast mass 12 o'clock location.  The ribbon shaped clip is appropriately located at the site of the mammographically evident mass in the 12 o'clock location with associated calcifications.  IMPRESSION: Appropriate ribbon shaped clip location at the site of the biopsied left breast 12 o'clock location mass.   Original Report Authenticated By: Harrel Lemon, M.D.    Mm Radiologist Eval And Mgmt  12/07/2011  *RADIOLOGY REPORT*  Clinical Data: Post biopsy evaluation  CONSULTATION  Comparison: December 01, 2011, March 18, 2009  Findings: The pathology revealed invasive ductal carcinoma and ductal carcinoma ins situ.  This is found to be concordant with imaging  findings.  I discussed the results with the the patient. The biopsy site is clean.  There is no complications.  The patient states she is doing well without problems post biopsy.  The patient has appointment for [facility] clinic on December 16, 2011.  The patient has appointment for MRI of the breasts on December 11, 2011.  IMPRESSION: Post biopsy consultation as described.  Recommend MRI of the breasts and and  [facility] as described.   Original Report Authenticated By: Sherian Rein, M.D.    Dg Fluoro Guide Cv Line-no Report  12/23/2011  CLINICAL DATA: port placement   FLOURO GUIDE CV LINE  Fluoroscopy was utilized by the requesting physician.  No radiographic  interpretation.     ASSESSMENT: 66 year old female with new diagnosis of  #1 stage II invasive mammary carcinoma of the left breast that is ER positive PR positive HER-2/neu positive with Ki-67 94%. Patient is interested in breast conservation. There for she will begin neoadjuvant chemotherapy consisting of perjeta, Herceptin, and Taxotere. This will be given every 21 days for a total of 4 cycles. After that we will plan on getting MRI of the breasts performed to evaluate response.  #2 patient will get Port-A-Cath placement chemotherapy teaching class as well as an echocardiogram.  #3 she will continue to be seen by Dr. Peter [place] for on going cardiac surveillance and she is receiving Her2 based therapy.  #4 SVT  PLAN:   #1 Marie Anderson is doing well today.  She will proceed with her third cycle of  chemotherapy.  She will receive IV fluids tomorrow and next week.    #2 Her HR is stable.  I refilled her roxicet for her pain.     #3 We will see her back on 12/19 for interim labs and IVF.    All questions were answered. The patient knows to call the clinic with any problems, questions or concerns. We can certainly see the patient much sooner if necessary.  I spent 25 minutes counseling the patient face to face. The total time spent in the  appointment was 30 minutes.  This case was reviewed with Dr. Welton Flakes.   Cherie Ouch Lyn Hollingshead, NP Medical Oncology [facility] Phone: [phone]

## 2012-02-11 NOTE — Patient Instructions (Addendum)
[facility] Discharge Instructions for Patients Receiving Chemotherapy  Today you received the following chemotherapy agents taxotere/herceptin/prejeta  To help prevent nausea and vomiting after your treatment, we encourage you to take your nausea medication  and take it as often as prescribed\   If you develop nausea and vomiting that is not controlled by your nausea medication, call the clinic. If it is after clinic hours your family physician or the after hours number for the clinic or go to the Emergency Department.   BELOW ARE SYMPTOMS THAT SHOULD BE REPORTED IMMEDIATELY:  *FEVER GREATER THAN 100.5 F  *CHILLS WITH OR WITHOUT FEVER  NAUSEA AND VOMITING THAT IS NOT CONTROLLED WITH YOUR NAUSEA MEDICATION  *UNUSUAL SHORTNESS OF BREATH  *UNUSUAL BRUISING OR BLEEDING  TENDERNESS IN MOUTH AND THROAT WITH OR WITHOUT PRESENCE OF ULCERS  *URINARY PROBLEMS  *BOWEL PROBLEMS  UNUSUAL RASH Items with * indicate a potential emergency and should be followed up as soon as possible.  One of the nurses will contact you 24 hours after your treatment. Please let the nurse know about any problems that you may have experienced. Feel free to call the clinic you have any questions or concerns. The clinic phone number is [phone].   I have been informed and understand all the instructions given to me. I know to contact the clinic, my physician, or go to the Emergency Department if any problems should occur. I do not have any questions at this time, but understand that I may call the clinic during office hours   should I have any questions or need assistance in obtaining follow up care.    __________________________________________  _____________  __________ Signature of Patient or Authorized Representative            Date                   Time    __________________________________________ Nurse's Signature

## 2012-02-11 NOTE — Telephone Encounter (Signed)
Per staff message and POF I have scheduled appts.  JMW  

## 2012-02-11 NOTE — Patient Instructions (Signed)
Doing well.  Proceed with chemo.  We will add you on for IV fluids tomorrow and next week.

## 2012-02-12 ENCOUNTER — Ambulatory Visit (HOSPITAL_BASED_OUTPATIENT_CLINIC_OR_DEPARTMENT_OTHER): Payer: BC Managed Care – PPO

## 2012-02-12 ENCOUNTER — Ambulatory Visit: Payer: BC Managed Care – PPO

## 2012-02-12 VITALS — BP 123/77 | HR 69 | Temp 97.4°F

## 2012-02-12 DIAGNOSIS — C50219 Malignant neoplasm of upper-inner quadrant of unspecified female breast: Secondary | ICD-10-CM

## 2012-02-12 DIAGNOSIS — E860 Dehydration: Secondary | ICD-10-CM

## 2012-02-12 DIAGNOSIS — Z5111 Encounter for antineoplastic chemotherapy: Secondary | ICD-10-CM

## 2012-02-12 MED ORDER — SODIUM CHLORIDE 0.9 % IV SOLN
Freq: Once | INTRAVENOUS | Status: AC
  Administered 2012-02-12: 13:00:00 via INTRAVENOUS

## 2012-02-12 MED ORDER — PEGFILGRASTIM INJECTION 6 MG/0.6ML
6.0000 mg | Freq: Once | SUBCUTANEOUS | Status: AC
  Administered 2012-02-12: 6 mg via SUBCUTANEOUS
  Filled 2012-02-12: qty 0.6

## 2012-02-12 NOTE — Patient Instructions (Signed)
[facility] Discharge Instructions for Patients Receiving Chemotherapy  Today you received the following: IV fluids  BELOW ARE SYMPTOMS THAT SHOULD BE REPORTED IMMEDIATELY:  *FEVER GREATER THAN 100.5 F  *CHILLS WITH OR WITHOUT FEVER  NAUSEA AND VOMITING THAT IS NOT CONTROLLED WITH YOUR NAUSEA MEDICATION  *UNUSUAL SHORTNESS OF BREATH  *UNUSUAL BRUISING OR BLEEDING  TENDERNESS IN MOUTH AND THROAT WITH OR WITHOUT PRESENCE OF ULCERS  *URINARY PROBLEMS  *BOWEL PROBLEMS  UNUSUAL RASH Items with * indicate a potential emergency and should be followed up as soon as possible.  Feel free to call the clinic you have any questions or concerns. The clinic phone number is [phone].

## 2012-02-16 ENCOUNTER — Encounter (INDEPENDENT_AMBULATORY_CARE_PROVIDER_SITE_OTHER): Payer: BC Managed Care – PPO | Admitting: General Surgery

## 2012-02-16 ENCOUNTER — Other Ambulatory Visit: Payer: BC Managed Care – PPO | Admitting: Lab

## 2012-02-18 ENCOUNTER — Other Ambulatory Visit: Payer: Self-pay | Admitting: *Deleted

## 2012-02-18 ENCOUNTER — Other Ambulatory Visit (HOSPITAL_BASED_OUTPATIENT_CLINIC_OR_DEPARTMENT_OTHER): Payer: BC Managed Care – PPO | Admitting: Lab

## 2012-02-18 ENCOUNTER — Ambulatory Visit (HOSPITAL_BASED_OUTPATIENT_CLINIC_OR_DEPARTMENT_OTHER): Payer: BC Managed Care – PPO

## 2012-02-18 ENCOUNTER — Encounter: Payer: Self-pay | Admitting: Adult Health

## 2012-02-18 ENCOUNTER — Ambulatory Visit (HOSPITAL_BASED_OUTPATIENT_CLINIC_OR_DEPARTMENT_OTHER): Payer: BC Managed Care – PPO | Admitting: Adult Health

## 2012-02-18 ENCOUNTER — Telehealth: Payer: Self-pay | Admitting: *Deleted

## 2012-02-18 VITALS — BP 112/71 | HR 96 | Temp 98.9°F | Resp 20 | Ht 65.0 in | Wt 192.0 lb

## 2012-02-18 DIAGNOSIS — C50219 Malignant neoplasm of upper-inner quadrant of unspecified female breast: Secondary | ICD-10-CM

## 2012-02-18 DIAGNOSIS — E860 Dehydration: Secondary | ICD-10-CM

## 2012-02-18 DIAGNOSIS — C50919 Malignant neoplasm of unspecified site of unspecified female breast: Secondary | ICD-10-CM

## 2012-02-18 DIAGNOSIS — I498 Other specified cardiac arrhythmias: Secondary | ICD-10-CM

## 2012-02-18 DIAGNOSIS — R42 Dizziness and giddiness: Secondary | ICD-10-CM

## 2012-02-18 DIAGNOSIS — Z170 Estrogen receptor positive status [ER+]: Secondary | ICD-10-CM

## 2012-02-18 LAB — COMPREHENSIVE METABOLIC PANEL (CC13)
Albumin: 3.5 g/dL (ref 3.5–5.0)
Alkaline Phosphatase: 69 U/L (ref 40–150)
BUN: 9 mg/dL (ref 7.0–26.0)
Calcium: 8.9 mg/dL (ref 8.4–10.4)
Chloride: 104 mEq/L (ref 98–107)
Glucose: 84 mg/dl (ref 70–99)
Potassium: 3.8 mEq/L (ref 3.5–5.1)
Sodium: 138 mEq/L (ref 136–145)
Total Protein: 5.5 g/dL — ABNORMAL LOW (ref 6.4–8.3)

## 2012-02-18 LAB — CBC WITH DIFFERENTIAL/PLATELET
Basophils Absolute: 0.1 10*3/uL (ref 0.0–0.1)
EOS%: 0.3 % (ref 0.0–7.0)
Eosinophils Absolute: 0 10*3/uL (ref 0.0–0.5)
HCT: 31.8 % — ABNORMAL LOW (ref 34.8–46.6)
HGB: 10.4 g/dL — ABNORMAL LOW (ref 11.6–15.9)
MCH: 32.3 pg (ref 25.1–34.0)
MONO#: 1.5 10*3/uL — ABNORMAL HIGH (ref 0.1–0.9)
NEUT#: 3.1 10*3/uL (ref 1.5–6.5)
NEUT%: 50.3 % (ref 38.4–76.8)
RDW: 14.9 % — ABNORMAL HIGH (ref 11.2–14.5)
WBC: 6.1 10*3/uL (ref 3.9–10.3)
lymph#: 1.4 10*3/uL (ref 0.9–3.3)

## 2012-02-18 MED ORDER — SODIUM CHLORIDE 0.9 % IV SOLN
Freq: Once | INTRAVENOUS | Status: AC
  Administered 2012-02-18: 13:00:00 via INTRAVENOUS

## 2012-02-18 MED ORDER — HEPARIN SOD (PORK) LOCK FLUSH 100 UNIT/ML IV SOLN
500.0000 [IU] | Freq: Once | INTRAVENOUS | Status: AC
  Administered 2012-02-18: 500 [IU] via INTRAVENOUS
  Filled 2012-02-18: qty 5

## 2012-02-18 MED ORDER — SODIUM CHLORIDE 0.9 % IJ SOLN
10.0000 mL | INTRAMUSCULAR | Status: DC | PRN
  Administered 2012-02-18: 10 mL via INTRAVENOUS
  Filled 2012-02-18: qty 10

## 2012-02-18 NOTE — Progress Notes (Signed)
OFFICE PROGRESS NOTE  CC  Marie Mire, MD [address] [place] [address] Dr. Sigmund Hazel Dr. Emelia Loron Dr. Lurline Hare  DIAGNOSIS: 66 year old female with invasive mammary carcinoma of the left breast ER positive PR positive HER-2/neu positive with Ki-67 94%.  PRIOR THERAPY:  #1 patient was originally seen in the multidisciplinary breast clinic on 12/16/2011. She was diagnosed with stage II a HER-2 positive ER/PR positive breast cancer of the left breast at the 3:00 position. She also had a second area that was ER/PR positive but HER-2/neu negative. Patient is interested in breast conservation.  #2 patient is planning on receiving neoadjuvant chemotherapy consisting of perjeta Herceptin and Taxotere given every 21 days.  CURRENT THERAPY: Neoadjuvant perjeta/herceptin/taxotere with neulasta support Cycle 3 Day 8  INTERVAL HISTORY: Marie Anderson 66 y.o. female returns for followup visit.  She's doing well today.  She has a mild rash on her chest and back that occurred after chemotehrapy.  She also has an occasional runny nose, and mild numbness in the tips of her fingers and toes, she is still able to walk easily and button her blouse and put in earrings.  Her labs are stable.  Otherwise she's feeling well and w/o questions or concerns.      MEDICAL HISTORY: Past Medical History  Diagnosis Date  . Anemia   . SVT (supraventricular tachycardia)   . Breast cancer     ALLERGIES:  is allergic to cephalosporins and codeine.  MEDICATIONS:  Current Outpatient Prescriptions  Medication Sig Dispense Refill  . dexamethasone (DECADRON) 4 MG tablet Take 2 tabs two times a day the day before Taxotere chemo. Then take 2 tabs daily starting the day after chemo for 2 days.  30 tablet  1  . lidocaine-prilocaine (EMLA) cream Apply topically as needed. Apply to port as directed  30 g  6  . LORazepam (ATIVAN) 0.5 MG tablet Take 1  tablet (0.5 mg total) by mouth every 6 (six) hours as needed (Nausea or vomiting).  30 tablet  0  . metoprolol succinate (TOPROL-XL) 100 MG 24 hr tablet Take 1 tablet (100 mg total) by mouth daily.  90 tablet  1  . ondansetron (ZOFRAN) 8 MG tablet Take 1 tablet two times a day for 2 days starting the day after chemo, then take two times a day as needed for nausea or vomiting.  30 tablet  1  . oxyCODONE-acetaminophen (ROXICET) 5-325 MG per tablet Take 1 tablet by mouth every 4 (four) hours as needed for pain.  30 tablet  0  . prochlorperazine (COMPAZINE) 10 MG tablet Take 1 tablet (10 mg total) by mouth every 6 (six) hours as needed (Nausea or vomiting).  30 tablet  1  . prochlorperazine (COMPAZINE) 25 MG suppository Place 1 suppository (25 mg total) rectally every 12 (twelve) hours as needed for nausea.  12 suppository  3  . Ascorbic Acid (VITAMIN C PO) Take 500 mg by mouth daily.       Marland Kitchen CALCIUM PO Take 1,200 mg by mouth daily.       . Cholecalciferol (VITAMIN D PO) Take 500 mg by mouth daily.       . ciprofloxacin (CIPRO) 500 MG tablet Take 1 tablet (500 mg total) by mouth 2 (two) times daily.  14 tablet  6  . Cyanocobalamin (VITAMIN B 12 PO) Take 3,000 mg by mouth daily.       . IRON PO  Take 65 mg by mouth daily.       Marland Kitchen loratadine (CLARITIN) 10 MG tablet Take 10 mg by mouth daily.        SURGICAL HISTORY:  Past Surgical History  Procedure Date  . Dilation and curettage of uterus   . Eye surgery     age 46-lt eye growth  . Colonoscopy   . Portacath placement 12/23/2011    Procedure: INSERTION PORT-A-CATH;  Surgeon: Emelia Loron, MD;  Location: [facility];  Service: General;  Laterality: Right;    REVIEW OF SYSTEMS:   General: fatigue (-), night sweats (-), fever (-), pain (-) Lymph: palpable nodes (-) HEENT: vision changes (-), mucositis (-), gum bleeding (-), epistaxis (-) Cardiovascular: chest pain (-), palpitations (-) Pulmonary: shortness of breath (-),  dyspnea on exertion (-), cough (-), hemoptysis (-) GI:  Early satiety (-), melena (-), dysphagia (-), nausea/vomiting (-), diarrhea (-) GU: dysuria (-), hematuria (-), incontinence (-) Musculoskeletal: joint swelling (-), joint pain (-), back pain (-) Neuro: weakness (-), numbness (+), headache (-), confusion (-) Skin: Rash (+), lesions (-), dryness (-) Psych: depression (-), suicidal/homicidal ideation (-), feeling of hopelessness (-)   PHYSICAL EXAMINATION: Blood pressure 112/71, pulse 96, temperature 98.9 F (37.2 C), temperature source Oral, resp. rate 20, height 5\' 5"  (1.651 m), weight 192 lb (87.091 kg). Body mass index is 31.95 kg/(m^2). General: Patient is a well appearing female in no acute distress HEENT: PERRLA, sclerae anicteric no conjunctival pallor, moist mucous membranes Neck: supple, no palpable adenopathy Lungs: clear to auscultation bilaterally, no wheezes, rhonchi, or rales Cardiovascular: regular rate rhythm, S1, S2, no murmurs, rubs or gallops Abdomen: Soft, non-tender, non-distended, normoactive bowel sounds, no HSM Extremities: warm and well perfused, no clubbing, cyanosis, or edema Skin: No lesions.  Maculopapular rash on chest wall and upper back.   Neuro: Non-focal Masses in breast difficult to appreciate.   ECOG PERFORMANCE STATUS: 0 - Asymptomatic  LABORATORY DATA: Lab Results  Component Value Date   WBC 6.1 02/18/2012   HGB 10.4* 02/18/2012   HCT 31.8* 02/18/2012   MCV 98.8 02/18/2012   PLT 113* 02/18/2012      Chemistry      Component Value Date/Time   NA 144 02/11/2012 1042   NA 140 01/08/2012 1921   K 4.2 02/11/2012 1042   K 3.4* 01/08/2012 1921   CL 109* 02/11/2012 1042   CL 102 01/08/2012 1921   CO2 26 02/11/2012 1042   BUN 9.0 02/11/2012 1042   BUN 12 01/08/2012 1921   CREATININE 0.7 02/11/2012 1042   CREATININE 1.00 01/08/2012 1921      Component Value Date/Time   CALCIUM 9.4 02/11/2012 1042   ALKPHOS 63 02/11/2012 1042   AST 17  02/11/2012 1042   ALT 17 02/11/2012 1042   BILITOT 0.57 02/11/2012 1042       RADIOGRAPHIC STUDIES:  US Breast Left  12/14/2011  *RADIOLOGY REPORT*  Clinical Data:  Abnormal MRI, the second look left breast ultrasound  LEFT BREAST ULTRASOUND  Comparison:  MRI dated 12/11/2011  On physical exam, I palpate normal tissue in the 3 o'clock position of the left breast, 3 cm from the nipple.  Findings: Ultrasound is performed, showing an oval heterogeneously hypoechoic mass with microlobulated margins in the 3 o'clock position, 3 cm the nipple measuring 1.2 x 0.7 x 1.1 cm.  This corresponds to the area of concern on recent MRI.  IMPRESSION: Mass, left breast which biopsy is done and reported separately.  RECOMMENDATION: Ultrasound guided left breast biopsy.  BI-RADS CATEGORY 4:  Suspicious abnormality - biopsy should be considered.   Original Report Authenticated By: Hiram Gash, M.D.    US Breast Left  12/01/2011  *RADIOLOGY REPORT*  Clinical Data:  66 year old female with palpable lump in the upper left breast discovered on self examination.  Also for annual bilateral mammograms.  DIGITAL DIAGNOSTIC BILATERAL MAMMOGRAM WITH CAD AND LEFT BREAST ULTRASOUND:  Comparison:  03/18/2009 mammograms.  Findings:  A spot compression view of the left breast and routine views of both breasts again demonstrate heterogeneously dense breast tissue. Within the upper left breast, an irregular mass with pleomorphic and linear calcifications is identified.  These calcifications span a length of 2 cm. No other mass, distortion or suspicious calcifications are identified bilaterally. Mammographic images were processed with CAD.  On physical exam, a firm mass is identified at the 12 o'clock position of the left breast 5 cm from the nipple.  Ultrasound is performed, showing a 10 x 9 x 8 mm irregular hypoechoic mass at the 12 o'clock position of the left breast 5 cm from the nipple.  A 3 mm satellite nodule lies 3 mm  superiomedial to the dominant mass. No definite enlarged abnormal appearing left axillary lymph nodes are identified.  IMPRESSION: 10 x 9 x 8 mm irregular mass in the upper left breast identified sonographically which likely represents the mammographic finding, as well as an adjacent 3 mm satellite nodule.  These findings are very worrisome for malignancy and tissue sampling is recommended.  These findings were discussed with the patient and her questions answered.  She was encouraged to begin/continue monthly self exams and to contact her primary physician if any changes noted.  BI-RADS CATEGORY 5:  Highly suggestive of malignancy - appropriate action should be taken.  RECOMMENDATION: Ultrasound guided left breast biopsy.  This has been scheduled for 12/07/2011 and the patient informed.   Original Report Authenticated By: Rosendo Gros, M.D.    Mr Breast Bilateral W Wo Contrast  12/11/2011  *RADIOLOGY REPORT*  Clinical Data: Recently biopsy-proven left breast invasive ductal carcinoma manifesting as a palpable left breast mass 12 o'clock location.  BILATERAL BREAST MRI WITH AND WITHOUT CONTRAST  Technique: Multiplanar, multisequence MR images of both breasts were obtained prior to and following the intravenous administration of 20ml of Multihance.  Three dimensional images were evaluated at the independent DynaCad workstation.  Comparison:  Prior mammograms and ultrasound  Findings: Post biopsy change is noted in the left breast 12 o'clock location as well as clip artifact corresponding to the biopsy- proven breast cancer.  No other abnormal T2-weighted hyperintensity on either side.  No lymphadenopathy.  Background parenchymal enhancement type pattern is mild.  In the left breast 12 o'clock location is an irregular spiculated enhancing mass with plateau type kinetics measuring 2.7 x 2.2 x 2.0 cm.  This corresponds to the biopsy-proven breast cancer.  In the left breast three o'clock location, there is a mildly  irregular enhancing mass plateau type enhancing kinetics measuring 1.1 x 1.1 x 1.0 cm.  No dominant area of abnormal enhancement seen in the right breast.  IMPRESSION: Abnormal enhancing mass left breast 12 o'clock location at the site of biopsy-proven breast cancer.  A second abnormally enhancing mass in the left breast in the 3 o'clock location is identified; second look ultrasound is recommended for further evaluation and possible ultrasound-guided biopsy.  If no sonographic correlate is identified, MRI guided core biopsy would be recommended.  No evidence for malignancy in the right breast.  RECOMMENDATION: BI-RADS CATEGORY 4:  Suspicious abnormality - biopsy should be considered.  THREE-DIMENSIONAL MR IMAGE RENDERING ON INDEPENDENT WORKSTATION:  Three-dimensional MR images were rendered by post-processing of the original MR data on an independent workstation.  The three- dimensional MR images were interpreted, and findings were reported in the accompanying complete MRI report for this study.  We will contact the patient to arrange for follow-up ultrasound.   Original Report Authenticated By: Harrel Lemon, M.D.    [place] Core Biopsy  12/17/2011  **ADDENDUM** CREATED: 12/15/2011 12:01:32  Histologic evaluation demonstrates invasive and in situ mammary carcinoma.  Grade II ductal carcinoma is favored.  This is concordant with the imaging findings.  Results were discussed with the patient by telephone at her request.  She reports no complications from the procedure.  She is scheduled to be seen in the [facility] on 12/16/2011.  **END ADDENDUM** SIGNED BY: Cain Saupe, M.D.   12/15/2011  **ADDENDUM** CREATED: 12/15/2011 12:01:32  Histologic evaluation demonstrates invasive and in situ mammary carcinoma.  Grade II ductal carcinoma is favored.  This is concordant with the imaging findings.  Results were discussed with the patient by telephone at her request.  She reports  no complications from the procedure.  She is scheduled to be seen in the [facility] on 12/16/2011.  **END ADDENDUM** SIGNED BY: Cain Saupe, M.D.   12/14/2011  *RADIOLOGY REPORT*  Clinical Data:  New diagnosis left sided breast cancer.  Abnormal MRI.  ULTRASOUND GUIDED CORE BIOPSY OF THE left BREAST  Comparison: Previous exams.  I met with the patient and we discussed the procedure of ultrasound- guided biopsy, including benefits and alternatives.  We discussed the high likelihood of a successful procedure. We discussed the risks of the procedure, including infection, bleeding, tissue injury, clip migration, and inadequate sampling.  Informed written consent was given.  Using sterile technique 2% lidocaine, ultrasound guidance and a 14 gauge automated biopsy device, biopsy was performed of the mass using a  lateral approach.  At the conclusion of the procedure a wing tissue marker clip was deployed into the biopsy cavity. Follow up 2 view mammogram was performed and dictated separately.  IMPRESSION: Ultrasound guided biopsy of a left breast mass.  No apparent complications.  Original Report Authenticated By: Daryl Eastern, M.D.    [place] Core Biopsy  12/04/2011  *RADIOLOGY REPORT*  Clinical Data:  Suspicious left breast mass 12 o'clock location  ULTRASOUND GUIDED VACUUM ASSISTED CORE BIOPSY OF THE LEFT BREAST  Comparison: Previous exams.  I met with the patient and we discussed the procedure of ultrasound- guided biopsy, including benefits and alternatives.  We discussed the high likelihood of a successful procedure. We discussed the risks of the procedure including infection, bleeding, tissue injury, clip migration, and inadequate sampling.  Informed written consent was given.  Using sterile technique, 2% lidocaine ultrasound guidance and a 12 gauge vacuum assisted needle biopsy was performed of left breast mass 12 o'clock location using a lateral to medial approach.   At the conclusion of the procedure, a ribbon shaped tissue marker clip was deployed into the biopsy cavity.  Follow-up 2-view mammogram was performed and dictated separately.  IMPRESSION:  Ultrasound-guided biopsy of left breast mass 12 o'clock location with ribbon shaped clip placement.  Pathology is pending.  No apparent complications.   Original Report Authenticated By: Harrel Lemon, M.D.    Dg Chest  Port 1 View  12/23/2011  *RADIOLOGY REPORT*  Clinical Data: Postop for Port-A-Cath placement.  Rule out pneumothorax.  PORTABLE CHEST - 1 VIEW  Comparison: 11/03/2005  Findings: Right-sided Port-A-Cath terminates at the mid to low SVC. No pneumothorax.  There is biapical pleural thickening.  Midline trachea.  Normal heart size and mediastinal contours.  No pleural fluid. Clear lungs.  IMPRESSION: New right-sided Port-A-Cath, without pneumothorax.   Original Report Authenticated By: Consuello Bossier, M.D.    Mm Digital Diagnostic Bilat  12/01/2011  *RADIOLOGY REPORT*  Clinical Data:  66 year old female with palpable lump in the upper left breast discovered on self examination.  Also for annual bilateral mammograms.  DIGITAL DIAGNOSTIC BILATERAL MAMMOGRAM WITH CAD AND LEFT BREAST ULTRASOUND:  Comparison:  03/18/2009 mammograms.  Findings:  A spot compression view of the left breast and routine views of both breasts again demonstrate heterogeneously dense breast tissue. Within the upper left breast, an irregular mass with pleomorphic and linear calcifications is identified.  These calcifications span a length of 2 cm. No other mass, distortion or suspicious calcifications are identified bilaterally. Mammographic images were processed with CAD.  On physical exam, a firm mass is identified at the 12 o'clock position of the left breast 5 cm from the nipple.  Ultrasound is performed, showing a 10 x 9 x 8 mm irregular hypoechoic mass at the 12 o'clock position of the left breast 5 cm from the nipple.  A 3 mm  satellite nodule lies 3 mm superiomedial to the dominant mass. No definite enlarged abnormal appearing left axillary lymph nodes are identified.  IMPRESSION: 10 x 9 x 8 mm irregular mass in the upper left breast identified sonographically which likely represents the mammographic finding, as well as an adjacent 3 mm satellite nodule.  These findings are very worrisome for malignancy and tissue sampling is recommended.  These findings were discussed with the patient and her questions answered.  She was encouraged to begin/continue monthly self exams and to contact her primary physician if any changes noted.  BI-RADS CATEGORY 5:  Highly suggestive of malignancy - appropriate action should be taken.  RECOMMENDATION: Ultrasound guided left breast biopsy.  This has been scheduled for 12/07/2011 and the patient informed.   Original Report Authenticated By: Rosendo Gros, M.D.    Mm Digital Diagnostic Unilat L  12/14/2011  *RADIOLOGY REPORT*  Clinical Data:  Known malignancy, left breast.  Ultrasound core biopsy for an area seen on recent MRI.  DIGITAL DIAGNOSTIC LEFT MAMMOGRAM  Comparison:  Previous exams.  Findings:  Films are performed following ultrasound guided biopsy of a left breast mass.  Images show a wing type clip in association with the mass seen sonographically in the 3 o'clock position of the left breast.  The clip is 6.8 cm lateral and inferior to the known malignancy.  IMPRESSION: Adequate clip placement following ultrasound guided left breast biopsy.   Original Report Authenticated By: Hiram Gash, M.D.    Mm Digital Diagnostic Unilat L  12/04/2011  *RADIOLOGY REPORT*  Clinical Data:  Ultrasound-guided left breast biopsy with clip placement  DIGITAL DIAGNOSTIC LEFT MAMMOGRAM  Comparison:  Previous exams.  Findings:  Films are performed following ultrasound guided biopsy of left breast mass 12 o'clock location.  The ribbon shaped clip is appropriately located at the site of the mammographically  evident mass in the 12 o'clock location with associated calcifications.  IMPRESSION: Appropriate ribbon shaped clip location at the site of the biopsied left breast 12 o'clock location  mass.   Original Report Authenticated By: Harrel Lemon, M.D.    Mm Radiologist Eval And Mgmt  12/07/2011  *RADIOLOGY REPORT*  Clinical Data: Post biopsy evaluation  CONSULTATION  Comparison: December 01, 2011, March 18, 2009  Findings: The pathology revealed invasive ductal carcinoma and ductal carcinoma ins situ.  This is found to be concordant with imaging findings.  I discussed the results with the the patient. The biopsy site is clean.  There is no complications.  The patient states she is doing well without problems post biopsy.  The patient has appointment for [facility] clinic on December 16, 2011.  The patient has appointment for MRI of the breasts on December 11, 2011.  IMPRESSION: Post biopsy consultation as described.  Recommend MRI of the breasts and and  [facility] as described.   Original Report Authenticated By: Sherian Rein, M.D.    Dg Fluoro Guide Cv Line-no Report  12/23/2011  CLINICAL DATA: port placement   FLOURO GUIDE CV LINE  Fluoroscopy was utilized by the requesting physician.  No radiographic  interpretation.     ASSESSMENT: 66 year old female with new diagnosis of  #1 stage II invasive mammary carcinoma of the left breast that is ER positive PR positive HER-2/neu positive with Ki-67 94%. Patient is interested in breast conservation. There for she will begin neoadjuvant chemotherapy consisting of perjeta, Herceptin, and Taxotere. This will be given every 21 days for a total of 4 cycles. After that we will plan on getting MRI of the breasts performed to evaluate response.  #2 patient will get Port-A-Cath placement chemotherapy teaching class as well as an echocardiogram.  #3 she will continue to be seen by Dr. Peter [place] for on going cardiac surveillance and she is receiving Her2 based  therapy.  #4 SVT  PLAN:   #1 Ms. Mcaulay is doing well today.  She will receive IV fluids today.    #2 Her HR is stable. Will follow.   #3 We will see her back on 03/04/11 for her fourth cycle of chemotherapy.  I have ordered her MRI and scheduled her f/u post MRI with Dr. Dwain Sarna.    All questions were answered. The patient knows to call the clinic with any problems, questions or concerns. We can certainly see the patient much sooner if necessary.  I spent 25 minutes counseling the patient face to face. The total time spent in the appointment was 30 minutes.  This case was reviewed with Dr. Welton Flakes.   Cherie Ouch Lyn Hollingshead, NP Medical Oncology [facility] Phone: [phone]

## 2012-02-18 NOTE — Telephone Encounter (Signed)
Cancelled dr.wakefield appointment for 02-22-2012 rescheduled for 03-14-2012

## 2012-02-18 NOTE — Patient Instructions (Addendum)
[facility] Discharge Instructions for Patients Receiving Chemotherapy  Today you received IVfluids.  If you develop nausea and vomiting that is not controlled by your nausea medication, call the clinic. If it is after clinic hours your family physician or the after hours number for the clinic or go to the Emergency Department.   BELOW ARE SYMPTOMS THAT SHOULD BE REPORTED IMMEDIATELY:  *FEVER GREATER THAN 100.5 F  *CHILLS WITH OR WITHOUT FEVER  NAUSEA AND VOMITING THAT IS NOT CONTROLLED WITH YOUR NAUSEA MEDICATION  *UNUSUAL SHORTNESS OF BREATH  *UNUSUAL BRUISING OR BLEEDING  TENDERNESS IN MOUTH AND THROAT WITH OR WITHOUT PRESENCE OF ULCERS  *URINARY PROBLEMS  *BOWEL PROBLEMS  UNUSUAL RASH Items with * indicate a potential emergency and should be followed up as soon as possible.  Feel free to call the clinic you have any questions or concerns. The clinic phone number is [phone].  , Adult  Dehydration means your body does not have as much fluid as it needs. Your kidneys, brain, and heart will not work properly without the right amount of fluids and salt.  HOME CARE  Ask your doctor how to replace body fluid losses (rehydrate).  Drink enough fluids to keep your pee (urine) clear or pale yellow.  Drink small amounts of fluids often if you feel sick to your stomach (nauseous) or throw up (vomit).  Eat like you normally do.    Avoid:  Foods or drinks high in sugar.  Bubbly (carbonated) drinks.  Juice.  Very hot or cold fluids.  Drinks with caffeine.  Fatty, greasy foods.  Alcohol.  Tobacco.  Eating too much.  Gelatin desserts.  Wash your hands to avoid spreading germs (bacteria, viruses).  Only take medicine as told by your doctor.  Keep all doctor visits as told. GET HELP RIGHT AWAY IF:   You cannot drink something without throwing up.  You get worse even with treatment.  Your vomit has blood in it or looks  greenish.  Your poop (stool) has blood in it or looks black and tarry.  You have not peed in 6 to 8 hours.  You pee a small amount of very dark pee.  You have a fever.  You pass out (faint).  You have belly (abdominal) pain that gets worse or stays in one spot (localizes).  You have a rash, stiff neck, or bad headache.  You get easily annoyed, sleepy, or are hard to wake up.  You feel weak, dizzy, or very thirsty. MAKE SURE YOU:   Understand these instructions.  Will watch your condition.  Will get help right away if you are not doing well or get worse. Document Released: 12/13/2008 Document Revised: 05/11/2011 Document Reviewed: 10/06/2010 [facility] Patient Information 2013 [place], [place].

## 2012-02-18 NOTE — Patient Instructions (Addendum)
Doing well.  Proceed with IV fluids today.  I have ordered your MRI of your breasts on January 6.  You should get an appointment with Dr. Dwain Sarna after your MRI.  We will see you back on 03/03/12 for your next cycle of chemo.

## 2012-02-22 ENCOUNTER — Encounter (INDEPENDENT_AMBULATORY_CARE_PROVIDER_SITE_OTHER): Payer: BC Managed Care – PPO | Admitting: General Surgery

## 2012-02-23 ENCOUNTER — Other Ambulatory Visit: Payer: BC Managed Care – PPO | Admitting: Lab

## 2012-02-26 ENCOUNTER — Encounter (INDEPENDENT_AMBULATORY_CARE_PROVIDER_SITE_OTHER): Payer: BC Managed Care – PPO | Admitting: General Surgery

## 2012-03-01 ENCOUNTER — Other Ambulatory Visit: Payer: BC Managed Care – PPO | Admitting: Lab

## 2012-03-03 ENCOUNTER — Ambulatory Visit (HOSPITAL_BASED_OUTPATIENT_CLINIC_OR_DEPARTMENT_OTHER): Payer: BC Managed Care – PPO

## 2012-03-03 ENCOUNTER — Telehealth: Payer: Self-pay | Admitting: *Deleted

## 2012-03-03 ENCOUNTER — Ambulatory Visit (HOSPITAL_BASED_OUTPATIENT_CLINIC_OR_DEPARTMENT_OTHER): Payer: BC Managed Care – PPO | Admitting: Adult Health

## 2012-03-03 ENCOUNTER — Encounter: Payer: Self-pay | Admitting: Adult Health

## 2012-03-03 ENCOUNTER — Other Ambulatory Visit (HOSPITAL_BASED_OUTPATIENT_CLINIC_OR_DEPARTMENT_OTHER): Payer: BC Managed Care – PPO | Admitting: Lab

## 2012-03-03 VITALS — BP 128/76 | HR 94 | Temp 97.9°F | Resp 20 | Ht 65.0 in | Wt 190.5 lb

## 2012-03-03 DIAGNOSIS — Z5111 Encounter for antineoplastic chemotherapy: Secondary | ICD-10-CM

## 2012-03-03 DIAGNOSIS — E860 Dehydration: Secondary | ICD-10-CM

## 2012-03-03 DIAGNOSIS — C50219 Malignant neoplasm of upper-inner quadrant of unspecified female breast: Secondary | ICD-10-CM

## 2012-03-03 DIAGNOSIS — I498 Other specified cardiac arrhythmias: Secondary | ICD-10-CM

## 2012-03-03 DIAGNOSIS — C50919 Malignant neoplasm of unspecified site of unspecified female breast: Secondary | ICD-10-CM

## 2012-03-03 DIAGNOSIS — Z170 Estrogen receptor positive status [ER+]: Secondary | ICD-10-CM

## 2012-03-03 DIAGNOSIS — Z5112 Encounter for antineoplastic immunotherapy: Secondary | ICD-10-CM

## 2012-03-03 LAB — CBC WITH DIFFERENTIAL/PLATELET
Basophils Absolute: 0 10*3/uL (ref 0.0–0.1)
EOS%: 0 % (ref 0.0–7.0)
Eosinophils Absolute: 0 10*3/uL (ref 0.0–0.5)
HGB: 9.2 g/dL — ABNORMAL LOW (ref 11.6–15.9)
LYMPH%: 14.6 % (ref 14.0–49.7)
MCH: 32.4 pg (ref 25.1–34.0)
MCV: 101.8 fL — ABNORMAL HIGH (ref 79.5–101.0)
MONO%: 8.3 % (ref 0.0–14.0)
NEUT#: 4.6 10*3/uL (ref 1.5–6.5)
NEUT%: 76.8 % (ref 38.4–76.8)
Platelets: 98 10*3/uL — ABNORMAL LOW (ref 145–400)

## 2012-03-03 LAB — COMPREHENSIVE METABOLIC PANEL (CC13)
Alkaline Phosphatase: 64 U/L (ref 40–150)
BUN: 10 mg/dL (ref 7.0–26.0)
CO2: 24 mEq/L (ref 22–29)
Creatinine: 0.7 mg/dL (ref 0.6–1.1)
Glucose: 111 mg/dl — ABNORMAL HIGH (ref 70–99)
Total Bilirubin: 0.59 mg/dL (ref 0.20–1.20)
Total Protein: 6 g/dL — ABNORMAL LOW (ref 6.4–8.3)

## 2012-03-03 MED ORDER — LORAZEPAM 2 MG/ML IJ SOLN
0.5000 mg | Freq: Once | INTRAMUSCULAR | Status: AC
  Administered 2012-03-03: 0.5 mg via INTRAVENOUS

## 2012-03-03 MED ORDER — ACETAMINOPHEN 325 MG PO TABS
650.0000 mg | ORAL_TABLET | Freq: Once | ORAL | Status: AC
  Administered 2012-03-03: 650 mg via ORAL

## 2012-03-03 MED ORDER — SODIUM CHLORIDE 0.9 % IV SOLN
420.0000 mg | Freq: Once | INTRAVENOUS | Status: AC
  Administered 2012-03-03: 420 mg via INTRAVENOUS
  Filled 2012-03-03: qty 14

## 2012-03-03 MED ORDER — DOCETAXEL CHEMO INJECTION 160 MG/16ML
75.0000 mg/m2 | Freq: Once | INTRAVENOUS | Status: AC
  Administered 2012-03-03: 160 mg via INTRAVENOUS
  Filled 2012-03-03: qty 16

## 2012-03-03 MED ORDER — ONDANSETRON 8 MG/50ML IVPB (CHCC)
8.0000 mg | Freq: Once | INTRAVENOUS | Status: AC
  Administered 2012-03-03: 8 mg via INTRAVENOUS

## 2012-03-03 MED ORDER — DIPHENHYDRAMINE HCL 25 MG PO CAPS
50.0000 mg | ORAL_CAPSULE | Freq: Once | ORAL | Status: AC
  Administered 2012-03-03: 50 mg via ORAL

## 2012-03-03 MED ORDER — TRASTUZUMAB CHEMO INJECTION 440 MG
6.0000 mg/kg | Freq: Once | INTRAVENOUS | Status: AC
  Administered 2012-03-03: 525 mg via INTRAVENOUS
  Filled 2012-03-03: qty 25

## 2012-03-03 MED ORDER — TRASTUZUMAB CHEMO INJECTION 440 MG: 6.0000 mg/kg | Freq: Once | INTRAVENOUS | Status: DC

## 2012-03-03 MED ORDER — DEXAMETHASONE SODIUM PHOSPHATE 10 MG/ML IJ SOLN
10.0000 mg | Freq: Once | INTRAMUSCULAR | Status: AC
  Administered 2012-03-03: 10 mg via INTRAVENOUS

## 2012-03-03 NOTE — Patient Instructions (Signed)
[facility] Discharge Instructions for Patients Receiving Chemotherapy  Today you received the following chemotherapy agents  To help prevent nausea and vomiting after your treatment, we encourage you to take your nausea medication    If you develop nausea and vomiting that is not controlled by your nausea medication, call the clinic. If it is after clinic hours your family physician or the after hours number for the clinic or go to the Emergency Department.   BELOW ARE SYMPTOMS THAT SHOULD BE REPORTED IMMEDIATELY:  *FEVER GREATER THAN 100.5 F  *CHILLS WITH OR WITHOUT FEVER  NAUSEA AND VOMITING THAT IS NOT CONTROLLED WITH YOUR NAUSEA MEDICATION  *UNUSUAL SHORTNESS OF BREATH  *UNUSUAL BRUISING OR BLEEDING  TENDERNESS IN MOUTH AND THROAT WITH OR WITHOUT PRESENCE OF ULCERS  *URINARY PROBLEMS  *BOWEL PROBLEMS  UNUSUAL RASH Items with * indicate a potential emergency and should be followed up as soon as possible.  One of the nurses will contact you 24 hours after your treatment. Please let the nurse know about any problems that you may have experienced. Feel free to call the clinic you have any questions or concerns. The clinic phone number is [phone].   I have been informed and understand all the instructions given to me. I know to contact the clinic, my physician, or go to the Emergency Department if any problems should occur. I do not have any questions at this time, but understand that I may call the clinic during office hours   should I have any questions or need assistance in obtaining follow up care.    __________________________________________  _____________  __________ Signature of Patient or Authorized Representative            Date                   Time    __________________________________________ Nurse's Signature

## 2012-03-03 NOTE — Telephone Encounter (Signed)
Sent michelle email to set up patients fluids  IV fluids 03/04/12 and 03/11/12

## 2012-03-03 NOTE — Patient Instructions (Signed)
Doing well, proceed with chemotherapy.  You will receive IV fluids tomorrow and next week after your Dr. Welton Flakes appt.  Please call us if you have any questions or concerns.

## 2012-03-03 NOTE — Progress Notes (Signed)
OFFICE PROGRESS NOTE  CC  Juluis Mire, MD [address], [address] [place] [address] Dr. Sigmund Hazel Dr. Emelia Loron Dr. Lurline Hare  DIAGNOSIS: 67 year old female with invasive mammary carcinoma of the left breast ER positive PR positive HER-2/neu positive with Ki-67 94%.  PRIOR THERAPY:  #1 patient was originally seen in the multidisciplinary breast clinic on 12/16/2011. She was diagnosed with stage II a HER-2 positive ER/PR positive breast cancer of the left breast at the 3:00 position. She also had a second area that was ER/PR positive but HER-2/neu negative. Patient is interested in breast conservation.  #2 patient is planning on receiving neoadjuvant chemotherapy consisting of perjeta Herceptin and Taxotere given every 21 days.  CURRENT THERAPY: Neoadjuvant perjeta/herceptin/taxotere with neulasta support Cycle 4 Day 1  INTERVAL HISTORY: Marie Anderson 67 y.o. female returns for her next cycle of chemotherapy.  She continues to have mild numbness on her fingertips and toes, however it is unchanged and she has retained her motor function.  Her rash that she had 2 weeks ago has improved.  She has however, felt dizzy and lightheaded on occasion over the past two weeks.  She doesn't check her blood pressure often at home.  She states she's eating and drinking enough.  She has her MRI scheduled on 03/07/12, and an appt with Dr. Dwain Sarna scheduled for 03/14/12.  Otherwise she's doing well and a 10 point ROS is neg.   MEDICAL HISTORY: Past Medical History  Diagnosis Date  . Anemia   . SVT (supraventricular tachycardia)   . Breast cancer     ALLERGIES:  is allergic to cephalosporins and codeine.  MEDICATIONS:  Current Outpatient Prescriptions  Medication Sig Dispense Refill  . Ascorbic Acid (VITAMIN C PO) Take 500 mg by mouth daily.       Marland Kitchen CALCIUM PO Take 1,200 mg by mouth daily.       . Cholecalciferol (VITAMIN D PO) Take  500 mg by mouth daily.       . ciprofloxacin (CIPRO) 500 MG tablet Take 1 tablet (500 mg total) by mouth 2 (two) times daily.  14 tablet  6  . Cyanocobalamin (VITAMIN B 12 PO) Take 3,000 mg by mouth daily.       Marland Kitchen dexamethasone (DECADRON) 4 MG tablet Take 2 tabs two times a day the day before Taxotere chemo. Then take 2 tabs daily starting the day after chemo for 2 days.  30 tablet  1  . IRON PO Take 65 mg by mouth daily.       Marland Kitchen lidocaine-prilocaine (EMLA) cream Apply topically as needed. Apply to port as directed  30 g  6  . loratadine (CLARITIN) 10 MG tablet Take 10 mg by mouth daily.      Marland Kitchen LORazepam (ATIVAN) 0.5 MG tablet Take 1 tablet (0.5 mg total) by mouth every 6 (six) hours as needed (Nausea or vomiting).  30 tablet  0  . metoprolol succinate (TOPROL-XL) 100 MG 24 hr tablet Take 1 tablet (100 mg total) by mouth daily.  90 tablet  1  . ondansetron (ZOFRAN) 8 MG tablet Take 1 tablet two times a day for 2 days starting the day after chemo, then take two times a day as needed for nausea or vomiting.  30 tablet  1  . oxyCODONE-acetaminophen (ROXICET) 5-325 MG per tablet Take 1 tablet by mouth every 4 (four) hours as needed for pain.  30 tablet  0  .  prochlorperazine (COMPAZINE) 10 MG tablet Take 1 tablet (10 mg total) by mouth every 6 (six) hours as needed (Nausea or vomiting).  30 tablet  1  . prochlorperazine (COMPAZINE) 25 MG suppository Place 1 suppository (25 mg total) rectally every 12 (twelve) hours as needed for nausea.  12 suppository  3   No current facility-administered medications for this visit.   Facility-Administered Medications Ordered in Other Visits  Medication Dose Route Frequency Provider Last Rate Last Dose  . DOCEtaxel (TAXOTERE) 160 mg in dextrose 5 % 250 mL chemo infusion  75 mg/m2 (Treatment Plan Actual) Intravenous Once Victorino December, MD      . ondansetron (ZOFRAN) IVPB 8 mg  8 mg Intravenous Once Victorino December, MD   8 mg at 03/03/12 1255  . pertuzumab (PERJETA)  420 mg in sodium chloride 0.9 % 250 mL chemo infusion  420 mg Intravenous Once Victorino December, MD      . trastuzumab (HERCEPTIN) 567 mg in sodium chloride 0.9 % 250 mL chemo infusion  6 mg/kg (Treatment Plan Actual) Intravenous Once Victorino December, MD        SURGICAL HISTORY:  Past Surgical History  Procedure Date  . Dilation and curettage of uterus   . Eye surgery     age 73-lt eye growth  . Colonoscopy   . Portacath placement 12/23/2011    Procedure: INSERTION PORT-A-CATH;  Surgeon: Emelia Loron, MD;  Location: [facility];  Service: General;  Laterality: Right;    REVIEW OF SYSTEMS:   General: fatigue (-), night sweats (-), fever (-), pain (-) Lymph: palpable nodes (-) HEENT: vision changes (-), mucositis (-), gum bleeding (-), epistaxis (-) Cardiovascular: chest pain (-), palpitations (-) Pulmonary: shortness of breath (-), dyspnea on exertion (-), cough (-), hemoptysis (-) GI:  Early satiety (-), melena (-), dysphagia (-), nausea/vomiting (-), diarrhea (-) GU: dysuria (-), hematuria (-), incontinence (-) Musculoskeletal: joint swelling (-), joint pain (-), back pain (-) Neuro: weakness (-), numbness (+), headache (-), confusion (-) Skin: Rash (+), lesions (-), dryness (-) Psych: depression (-), suicidal/homicidal ideation (-), feeling of hopelessness (-)   PHYSICAL EXAMINATION: Blood pressure 128/76, pulse 94, temperature 97.9 F (36.6 C), resp. rate 20, height 5\' 5"  (1.651 m), weight 190 lb 8 oz (86.41 kg). Body mass index is 31.70 kg/(m^2). General: Patient is a well appearing female in no acute distress HEENT: PERRLA, sclerae anicteric no conjunctival pallor, moist mucous membranes Neck: supple, no palpable adenopathy Lungs: clear to auscultation bilaterally, no wheezes, rhonchi, or rales Cardiovascular: regular rate rhythm, S1, S2, no murmurs, rubs or gallops Abdomen: Soft, non-tender, non-distended, normoactive bowel sounds, no HSM Extremities: warm  and well perfused, no clubbing, cyanosis, or edema Skin: No lesions.  Maculopapular rash on chest wall and upper back.   Neuro: Non-focal Masses in breast difficult to appreciate.   ECOG PERFORMANCE STATUS: 0 - Asymptomatic  LABORATORY DATA: Lab Results  Component Value Date   WBC 6.0 03/03/2012   HGB 9.2* 03/03/2012   HCT 28.9* 03/03/2012   MCV 101.8* 03/03/2012   PLT 98* 03/03/2012      Chemistry      Component Value Date/Time   NA 138 02/18/2012 1054   NA 140 01/08/2012 1921   K 3.8 02/18/2012 1054   K 3.4* 01/08/2012 1921   CL 104 02/18/2012 1054   CL 102 01/08/2012 1921   CO2 28 02/18/2012 1054   BUN 9.0 02/18/2012 1054   BUN 12 01/08/2012 1921  CREATININE 0.7 02/18/2012 1054   CREATININE 1.00 01/08/2012 1921      Component Value Date/Time   CALCIUM 8.9 02/18/2012 1054   ALKPHOS 69 02/18/2012 1054   AST 16 02/18/2012 1054   ALT 18 02/18/2012 1054   BILITOT 1.11 02/18/2012 1054       RADIOGRAPHIC STUDIES:  US Breast Left  12/14/2011  *RADIOLOGY REPORT*  Clinical Data:  Abnormal MRI, the second look left breast ultrasound  LEFT BREAST ULTRASOUND  Comparison:  MRI dated 12/11/2011  On physical exam, I palpate normal tissue in the 3 o'clock position of the left breast, 3 cm from the nipple.  Findings: Ultrasound is performed, showing an oval heterogeneously hypoechoic mass with microlobulated margins in the 3 o'clock position, 3 cm the nipple measuring 1.2 x 0.7 x 1.1 cm.  This corresponds to the area of concern on recent MRI.  IMPRESSION: Mass, left breast which biopsy is done and reported separately.  RECOMMENDATION: Ultrasound guided left breast biopsy.  BI-RADS CATEGORY 4:  Suspicious abnormality - biopsy should be considered.   Original Report Authenticated By: Hiram Gash, M.D.    US Breast Left  12/01/2011  *RADIOLOGY REPORT*  Clinical Data:  67 year old female with palpable lump in the upper left breast discovered on self examination.  Also for annual bilateral  mammograms.  DIGITAL DIAGNOSTIC BILATERAL MAMMOGRAM WITH CAD AND LEFT BREAST ULTRASOUND:  Comparison:  03/18/2009 mammograms.  Findings:  A spot compression view of the left breast and routine views of both breasts again demonstrate heterogeneously dense breast tissue. Within the upper left breast, an irregular mass with pleomorphic and linear calcifications is identified.  These calcifications span a length of 2 cm. No other mass, distortion or suspicious calcifications are identified bilaterally. Mammographic images were processed with CAD.  On physical exam, a firm mass is identified at the 12 o'clock position of the left breast 5 cm from the nipple.  Ultrasound is performed, showing a 10 x 9 x 8 mm irregular hypoechoic mass at the 12 o'clock position of the left breast 5 cm from the nipple.  A 3 mm satellite nodule lies 3 mm superiomedial to the dominant mass. No definite enlarged abnormal appearing left axillary lymph nodes are identified.  IMPRESSION: 10 x 9 x 8 mm irregular mass in the upper left breast identified sonographically which likely represents the mammographic finding, as well as an adjacent 3 mm satellite nodule.  These findings are very worrisome for malignancy and tissue sampling is recommended.  These findings were discussed with the patient and her questions answered.  She was encouraged to begin/continue monthly self exams and to contact her primary physician if any changes noted.  BI-RADS CATEGORY 5:  Highly suggestive of malignancy - appropriate action should be taken.  RECOMMENDATION: Ultrasound guided left breast biopsy.  This has been scheduled for 12/07/2011 and the patient informed.   Original Report Authenticated By: Rosendo Gros, M.D.    Mr Breast Bilateral W Wo Contrast  12/11/2011  *RADIOLOGY REPORT*  Clinical Data: Recently biopsy-proven left breast invasive ductal carcinoma manifesting as a palpable left breast mass 12 o'clock location.  BILATERAL BREAST MRI WITH AND WITHOUT  CONTRAST  Technique: Multiplanar, multisequence MR images of both breasts were obtained prior to and following the intravenous administration of 20ml of Multihance.  Three dimensional images were evaluated at the independent DynaCad workstation.  Comparison:  Prior mammograms and ultrasound  Findings: Post biopsy change is noted in the left breast 12 o'clock location as  well as clip artifact corresponding to the biopsy- proven breast cancer.  No other abnormal T2-weighted hyperintensity on either side.  No lymphadenopathy.  Background parenchymal enhancement type pattern is mild.  In the left breast 12 o'clock location is an irregular spiculated enhancing mass with plateau type kinetics measuring 2.7 x 2.2 x 2.0 cm.  This corresponds to the biopsy-proven breast cancer.  In the left breast three o'clock location, there is a mildly irregular enhancing mass plateau type enhancing kinetics measuring 1.1 x 1.1 x 1.0 cm.  No dominant area of abnormal enhancement seen in the right breast.  IMPRESSION: Abnormal enhancing mass left breast 12 o'clock location at the site of biopsy-proven breast cancer.  A second abnormally enhancing mass in the left breast in the 3 o'clock location is identified; second look ultrasound is recommended for further evaluation and possible ultrasound-guided biopsy.  If no sonographic correlate is identified, MRI guided core biopsy would be recommended.  No evidence for malignancy in the right breast.  RECOMMENDATION: BI-RADS CATEGORY 4:  Suspicious abnormality - biopsy should be considered.  THREE-DIMENSIONAL MR IMAGE RENDERING ON INDEPENDENT WORKSTATION:  Three-dimensional MR images were rendered by post-processing of the original MR data on an independent workstation.  The three- dimensional MR images were interpreted, and findings were reported in the accompanying complete MRI report for this study.  We will contact the patient to arrange for follow-up ultrasound.   Original Report  Authenticated By: Harrel Lemon, M.D.    [place] Core Biopsy  12/17/2011  **ADDENDUM** CREATED: 12/15/2011 12:01:32  Histologic evaluation demonstrates invasive and in situ mammary carcinoma.  Grade II ductal carcinoma is favored.  This is concordant with the imaging findings.  Results were discussed with the patient by telephone at her request.  She reports no complications from the procedure.  She is scheduled to be seen in the [facility] on 12/16/2011.  **END ADDENDUM** SIGNED BY: Cain Saupe, M.D.   12/15/2011  **ADDENDUM** CREATED: 12/15/2011 12:01:32  Histologic evaluation demonstrates invasive and in situ mammary carcinoma.  Grade II ductal carcinoma is favored.  This is concordant with the imaging findings.  Results were discussed with the patient by telephone at her request.  She reports no complications from the procedure.  She is scheduled to be seen in the [facility] on 12/16/2011.  **END ADDENDUM** SIGNED BY: Cain Saupe, M.D.   12/14/2011  *RADIOLOGY REPORT*  Clinical Data:  New diagnosis left sided breast cancer.  Abnormal MRI.  ULTRASOUND GUIDED CORE BIOPSY OF THE left BREAST  Comparison: Previous exams.  I met with the patient and we discussed the procedure of ultrasound- guided biopsy, including benefits and alternatives.  We discussed the high likelihood of a successful procedure. We discussed the risks of the procedure, including infection, bleeding, tissue injury, clip migration, and inadequate sampling.  Informed written consent was given.  Using sterile technique 2% lidocaine, ultrasound guidance and a 14 gauge automated biopsy device, biopsy was performed of the mass using a  lateral approach.  At the conclusion of the procedure a wing tissue marker clip was deployed into the biopsy cavity. Follow up 2 view mammogram was performed and dictated separately.  IMPRESSION: Ultrasound guided biopsy of a left  breast mass.  No apparent complications.  Original Report Authenticated By: Daryl Eastern, M.D.    [place] Core Biopsy  12/04/2011  *RADIOLOGY REPORT*  Clinical Data:  Suspicious left breast mass 12 o'clock location  ULTRASOUND GUIDED  VACUUM ASSISTED CORE BIOPSY OF THE LEFT BREAST  Comparison: Previous exams.  I met with the patient and we discussed the procedure of ultrasound- guided biopsy, including benefits and alternatives.  We discussed the high likelihood of a successful procedure. We discussed the risks of the procedure including infection, bleeding, tissue injury, clip migration, and inadequate sampling.  Informed written consent was given.  Using sterile technique, 2% lidocaine ultrasound guidance and a 12 gauge vacuum assisted needle biopsy was performed of left breast mass 12 o'clock location using a lateral to medial approach.  At the conclusion of the procedure, a ribbon shaped tissue marker clip was deployed into the biopsy cavity.  Follow-up 2-view mammogram was performed and dictated separately.  IMPRESSION:  Ultrasound-guided biopsy of left breast mass 12 o'clock location with ribbon shaped clip placement.  Pathology is pending.  No apparent complications.   Original Report Authenticated By: Harrel Lemon, M.D.    Dg Chest Port 1 View  12/23/2011  *RADIOLOGY REPORT*  Clinical Data: Postop for Port-A-Cath placement.  Rule out pneumothorax.  PORTABLE CHEST - 1 VIEW  Comparison: 11/03/2005  Findings: Right-sided Port-A-Cath terminates at the mid to low SVC. No pneumothorax.  There is biapical pleural thickening.  Midline trachea.  Normal heart size and mediastinal contours.  No pleural fluid. Clear lungs.  IMPRESSION: New right-sided Port-A-Cath, without pneumothorax.   Original Report Authenticated By: Consuello Bossier, M.D.    Mm Digital Diagnostic Bilat  12/01/2011  *RADIOLOGY REPORT*  Clinical Data:  67 year old female with palpable lump in the upper left breast discovered on self  examination.  Also for annual bilateral mammograms.  DIGITAL DIAGNOSTIC BILATERAL MAMMOGRAM WITH CAD AND LEFT BREAST ULTRASOUND:  Comparison:  03/18/2009 mammograms.  Findings:  A spot compression view of the left breast and routine views of both breasts again demonstrate heterogeneously dense breast tissue. Within the upper left breast, an irregular mass with pleomorphic and linear calcifications is identified.  These calcifications span a length of 2 cm. No other mass, distortion or suspicious calcifications are identified bilaterally. Mammographic images were processed with CAD.  On physical exam, a firm mass is identified at the 12 o'clock position of the left breast 5 cm from the nipple.  Ultrasound is performed, showing a 10 x 9 x 8 mm irregular hypoechoic mass at the 12 o'clock position of the left breast 5 cm from the nipple.  A 3 mm satellite nodule lies 3 mm superiomedial to the dominant mass. No definite enlarged abnormal appearing left axillary lymph nodes are identified.  IMPRESSION: 10 x 9 x 8 mm irregular mass in the upper left breast identified sonographically which likely represents the mammographic finding, as well as an adjacent 3 mm satellite nodule.  These findings are very worrisome for malignancy and tissue sampling is recommended.  These findings were discussed with the patient and her questions answered.  She was encouraged to begin/continue monthly self exams and to contact her primary physician if any changes noted.  BI-RADS CATEGORY 5:  Highly suggestive of malignancy - appropriate action should be taken.  RECOMMENDATION: Ultrasound guided left breast biopsy.  This has been scheduled for 12/07/2011 and the patient informed.   Original Report Authenticated By: Rosendo Gros, M.D.    Mm Digital Diagnostic Unilat L  12/14/2011  *RADIOLOGY REPORT*  Clinical Data:  Known malignancy, left breast.  Ultrasound core biopsy for an area seen on recent MRI.  DIGITAL DIAGNOSTIC LEFT MAMMOGRAM   Comparison:  Previous exams.  Findings:  Films are performed following ultrasound guided biopsy of a left breast mass.  Images show a wing type clip in association with the mass seen sonographically in the 3 o'clock position of the left breast.  The clip is 6.8 cm lateral and inferior to the known malignancy.  IMPRESSION: Adequate clip placement following ultrasound guided left breast biopsy.   Original Report Authenticated By: Hiram Gash, M.D.    Mm Digital Diagnostic Unilat L  12/04/2011  *RADIOLOGY REPORT*  Clinical Data:  Ultrasound-guided left breast biopsy with clip placement  DIGITAL DIAGNOSTIC LEFT MAMMOGRAM  Comparison:  Previous exams.  Findings:  Films are performed following ultrasound guided biopsy of left breast mass 12 o'clock location.  The ribbon shaped clip is appropriately located at the site of the mammographically evident mass in the 12 o'clock location with associated calcifications.  IMPRESSION: Appropriate ribbon shaped clip location at the site of the biopsied left breast 12 o'clock location mass.   Original Report Authenticated By: Harrel Lemon, M.D.    Mm Radiologist Eval And Mgmt  12/07/2011  *RADIOLOGY REPORT*  Clinical Data: Post biopsy evaluation  CONSULTATION  Comparison: December 01, 2011, March 18, 2009  Findings: The pathology revealed invasive ductal carcinoma and ductal carcinoma ins situ.  This is found to be concordant with imaging findings.  I discussed the results with the the patient. The biopsy site is clean.  There is no complications.  The patient states she is doing well without problems post biopsy.  The patient has appointment for [facility] on December 16, 2011.  The patient has appointment for MRI of the breasts on December 11, 2011.  IMPRESSION: Post biopsy consultation as described.  Recommend MRI of the breasts and and  [facility] as described.   Original Report Authenticated By: Sherian Rein, M.D.    Dg Fluoro Guide Cv Line-no  Report  12/23/2011  CLINICAL DATA: port placement   FLOURO GUIDE CV LINE  Fluoroscopy was utilized by the requesting physician.  No radiographic  interpretation.     ASSESSMENT: 67 year old female with new diagnosis of  #1 stage II invasive mammary carcinoma of the left breast that is ER positive PR positive HER-2/neu positive with Ki-67 94%. Patient is interested in breast conservation. There for she will begin neoadjuvant chemotherapy consisting of perjeta, Herceptin, and Taxotere. This will be given every 21 days for a total of 4 cycles. After that we will plan on getting MRI of the breasts performed to evaluate response.  #2 patient will get Port-A-Cath placement chemotherapy teaching class as well as an echocardiogram.  #3 she will continue to be seen by Dr. Peter [place] for on going cardiac surveillance and she is receiving Her2 based therapy.  #4 SVT  PLAN:   #1.  Marie Anderson is doing well.  She will proceed with her chemotherapy.  I have scheduled her to receive IV fluids today, tomorrow, and next week.    #2 Her HR is stable. Will follow.   #3 We will see her back on 03/11/12.  She will check her BP at home, and let us know if it is running low.    All questions were answered. The patient knows to call the clinic with any problems, questions or concerns. We can certainly see the patient much sooner if necessary.  I spent 25 minutes counseling the patient face to face. The total time spent in the appointment was 30 minutes.  This case was reviewed with Dr. Welton Flakes.   Mardella Layman  Sunday Spillers, NP Medical Oncology [facility] Phone: [phone]

## 2012-03-03 NOTE — Telephone Encounter (Signed)
Per staff message and POF I have scheduled appts.  JMW  

## 2012-03-04 ENCOUNTER — Ambulatory Visit: Payer: BC Managed Care – PPO

## 2012-03-04 ENCOUNTER — Ambulatory Visit (HOSPITAL_BASED_OUTPATIENT_CLINIC_OR_DEPARTMENT_OTHER): Payer: BC Managed Care – PPO

## 2012-03-04 VITALS — BP 120/78 | HR 72 | Temp 97.3°F | Resp 20

## 2012-03-04 DIAGNOSIS — C50919 Malignant neoplasm of unspecified site of unspecified female breast: Secondary | ICD-10-CM

## 2012-03-04 DIAGNOSIS — C50219 Malignant neoplasm of upper-inner quadrant of unspecified female breast: Secondary | ICD-10-CM

## 2012-03-04 DIAGNOSIS — Z5189 Encounter for other specified aftercare: Secondary | ICD-10-CM

## 2012-03-04 DIAGNOSIS — E860 Dehydration: Secondary | ICD-10-CM

## 2012-03-04 MED ORDER — HEPARIN SOD (PORK) LOCK FLUSH 100 UNIT/ML IV SOLN
500.0000 [IU] | Freq: Once | INTRAVENOUS | Status: AC
  Administered 2012-03-04: 500 [IU] via INTRAVENOUS
  Filled 2012-03-04: qty 5

## 2012-03-04 MED ORDER — SODIUM CHLORIDE 0.9 % IJ SOLN
10.0000 mL | INTRAMUSCULAR | Status: DC | PRN
  Administered 2012-03-04: 10 mL via INTRAVENOUS
  Filled 2012-03-04: qty 10

## 2012-03-04 MED ORDER — PEGFILGRASTIM INJECTION 6 MG/0.6ML
6.0000 mg | Freq: Once | SUBCUTANEOUS | Status: AC
  Administered 2012-03-04: 6 mg via SUBCUTANEOUS
  Filled 2012-03-04: qty 0.6

## 2012-03-04 MED ORDER — SODIUM CHLORIDE 0.9 % IV SOLN
1000.0000 mL | Freq: Once | INTRAVENOUS | Status: AC
  Administered 2012-03-04: 1000 mL via INTRAVENOUS

## 2012-03-04 NOTE — Patient Instructions (Addendum)
Dehydration, Adult Dehydration is when you lose more fluids from the body than you take in. Vital organs like the kidneys, brain, and heart cannot function without a proper amount of fluids and salt. Any loss of fluids from the body can cause dehydration.  CAUSES   Vomiting.  Diarrhea.  Excessive sweating.  Excessive urine output.  Fever. SYMPTOMS  Mild dehydration  Thirst.  Dry lips.  Slightly dry mouth. Moderate dehydration  Very dry mouth.  Sunken eyes.  Skin does not bounce back quickly when lightly pinched and released.  Dark urine and decreased urine production.  Decreased tear production.  Headache. Severe dehydration  Very dry mouth.  Extreme thirst.  Rapid, weak pulse (more than 100 beats per minute at rest).  Cold hands and feet.  Not able to sweat in spite of heat and temperature.  Rapid breathing.  Blue lips.  Confusion and lethargy.  Difficulty being awakened.  Minimal urine production.  No tears. DIAGNOSIS  Your caregiver will diagnose dehydration based on your symptoms and your exam. Blood and urine tests will help confirm the diagnosis. The diagnostic evaluation should also identify the cause of dehydration. TREATMENT  Treatment of mild or moderate dehydration can often be done at home by increasing the amount of fluids that you drink. It is best to drink small amounts of fluid more often. Drinking too much at one time can make vomiting worse. Refer to the home care instructions below. Severe dehydration needs to be treated at the hospital where you will probably be given intravenous (IV) fluids that contain water and electrolytes. HOME CARE INSTRUCTIONS   Ask your caregiver about specific rehydration instructions.  Drink enough fluids to keep your urine clear or pale yellow.  Drink small amounts frequently if you have nausea and vomiting.  Eat as you normally do.  Avoid:  Foods or drinks high in sugar.  Carbonated  drinks.  Juice.  Extremely hot or cold fluids.  Drinks with caffeine.  Fatty, greasy foods.  Alcohol.  Tobacco.  Overeating.  Gelatin desserts.  Wash your hands well to avoid spreading bacteria and viruses.  Only take over-the-counter or prescription medicines for pain, discomfort, or fever as directed by your caregiver.  Ask your caregiver if you should continue all prescribed and over-the-counter medicines.  Keep all follow-up appointments with your caregiver. SEEK MEDICAL CARE IF:  You have abdominal pain and it increases or stays in one area (localizes).  You have a rash, stiff neck, or severe headache.  You are irritable, sleepy, or difficult to awaken.  You are weak, dizzy, or extremely thirsty. SEEK IMMEDIATE MEDICAL CARE IF:   You are unable to keep fluids down or you get worse despite treatment.  You have frequent episodes of vomiting or diarrhea.  You have blood or green matter (bile) in your vomit.  You have blood in your stool or your stool looks black and tarry.  You have not urinated in 6 to 8 hours, or you have only urinated a small amount of very dark urine.  You have a fever.  You faint. MAKE SURE YOU:   Understand these instructions.  Will watch your condition.  Will get help right away if you are not doing well or get worse. Document Released: 02/16/2005 Document Revised: 05/11/2011 Document Reviewed: 10/06/2010 [employer] Patient Information 2013 [employer].  

## 2012-03-07 ENCOUNTER — Ambulatory Visit
Admission: RE | Admit: 2012-03-07 | Discharge: 2012-03-07 | Disposition: A | Discharge status: Home / Self Care | Payer: BC Managed Care – PPO | Source: Ambulatory Visit | Attending: Adult Health | Admitting: Adult Health

## 2012-03-07 DIAGNOSIS — C50219 Malignant neoplasm of upper-inner quadrant of unspecified female breast: Secondary | ICD-10-CM

## 2012-03-07 MED ORDER — GADOBENATE DIMEGLUMINE 529 MG/ML IV SOLN
18.0000 mL | Freq: Once | INTRAVENOUS | Status: AC | PRN
  Administered 2012-03-07: 18 mL via INTRAVENOUS

## 2012-03-11 ENCOUNTER — Telehealth: Payer: Self-pay | Admitting: *Deleted

## 2012-03-11 ENCOUNTER — Encounter: Payer: Self-pay | Admitting: Oncology

## 2012-03-11 ENCOUNTER — Ambulatory Visit (HOSPITAL_BASED_OUTPATIENT_CLINIC_OR_DEPARTMENT_OTHER): Payer: BC Managed Care – PPO | Admitting: Oncology

## 2012-03-11 ENCOUNTER — Other Ambulatory Visit (HOSPITAL_BASED_OUTPATIENT_CLINIC_OR_DEPARTMENT_OTHER): Payer: BC Managed Care – PPO | Admitting: Lab

## 2012-03-11 ENCOUNTER — Ambulatory Visit (HOSPITAL_BASED_OUTPATIENT_CLINIC_OR_DEPARTMENT_OTHER): Payer: BC Managed Care – PPO

## 2012-03-11 ENCOUNTER — Telehealth: Payer: Self-pay | Admitting: Oncology

## 2012-03-11 VITALS — BP 103/67 | HR 103 | Temp 98.2°F | Resp 20 | Ht 65.0 in | Wt 183.6 lb

## 2012-03-11 DIAGNOSIS — C50919 Malignant neoplasm of unspecified site of unspecified female breast: Secondary | ICD-10-CM

## 2012-03-11 DIAGNOSIS — C50219 Malignant neoplasm of upper-inner quadrant of unspecified female breast: Secondary | ICD-10-CM

## 2012-03-11 DIAGNOSIS — E860 Dehydration: Secondary | ICD-10-CM

## 2012-03-11 DIAGNOSIS — Z170 Estrogen receptor positive status [ER+]: Secondary | ICD-10-CM

## 2012-03-11 LAB — CBC WITH DIFFERENTIAL/PLATELET
Basophils Absolute: 0.1 10*3/uL (ref 0.0–0.1)
HCT: 31.1 % — ABNORMAL LOW (ref 34.8–46.6)
HGB: 10 g/dL — ABNORMAL LOW (ref 11.6–15.9)
MCH: 32.8 pg (ref 25.1–34.0)
MONO#: 1.5 10*3/uL — ABNORMAL HIGH (ref 0.1–0.9)
NEUT%: 69.8 % (ref 38.4–76.8)
Platelets: 106 10*3/uL — ABNORMAL LOW (ref 145–400)
WBC: 10.2 10*3/uL (ref 3.9–10.3)
lymph#: 1.4 10*3/uL (ref 0.9–3.3)

## 2012-03-11 LAB — COMPREHENSIVE METABOLIC PANEL (CC13)
ALT: 35 U/L (ref 0–55)
AST: 29 U/L (ref 5–34)
Alkaline Phosphatase: 112 U/L (ref 40–150)
Chloride: 104 mEq/L (ref 98–107)
Creatinine: 0.9 mg/dL (ref 0.6–1.1)
Potassium: 4.2 mEq/L (ref 3.5–5.1)
Total Bilirubin: 0.76 mg/dL (ref 0.20–1.20)
Total Protein: 6.3 g/dL — ABNORMAL LOW (ref 6.4–8.3)

## 2012-03-11 MED ORDER — SODIUM CHLORIDE 0.9 % IJ SOLN
10.0000 mL | INTRAMUSCULAR | Status: DC | PRN
  Administered 2012-03-11: 10 mL via INTRAVENOUS
  Filled 2012-03-11: qty 10

## 2012-03-11 MED ORDER — SODIUM CHLORIDE 0.9 % IV SOLN
Freq: Once | INTRAVENOUS | Status: AC
  Administered 2012-03-11: 13:00:00 via INTRAVENOUS

## 2012-03-11 MED ORDER — HEPARIN SOD (PORK) LOCK FLUSH 100 UNIT/ML IV SOLN
500.0000 [IU] | Freq: Once | INTRAVENOUS | Status: AC
  Administered 2012-03-11: 500 [IU] via INTRAVENOUS
  Filled 2012-03-11: qty 5

## 2012-03-11 NOTE — Patient Instructions (Addendum)
Proceed with follow up in 2/6 with herceptin

## 2012-03-11 NOTE — Progress Notes (Signed)
OFFICE PROGRESS NOTE  CC  Juluis Mire, MD [address], [address] [place] [address] Dr. Sigmund Hazel Dr. Emelia Loron Dr. Lurline Hare  DIAGNOSIS: 67 year old female with invasive mammary carcinoma of the left breast ER positive PR positive HER-2/neu positive with Ki-67 94%.  PRIOR THERAPY:  #1 patient was originally seen in the multidisciplinary breast clinic on 12/16/2011. She was diagnosed with stage II a HER-2 positive ER/PR positive breast cancer of the left breast at the 3:00 position. She also had a second area that was ER/PR positive but HER-2/neu negative. Patient is interested in breast conservation.  #2Patient is now status post 4 cycles of Herceptin progenitor and Taxotere given every 21 days administered from 12/31/2011 to 03/11/2012.  #3 patient had MRI of the breasts performed on 03/07/2012 that revealed good response to neoadjuvant treatment. She will now be referred to Dr. Emelia Loron for definitive surgery.  CURRENT THERAPY: Proceed with surgery of the breast.  INTERVAL HISTORY: Marie Anderson 67 y.o. female returns for Followup visit today. She has now completed 4 cycles of her chemotherapy. Her last cycle given was last week. Overall she has tolerated the treatment well without any problems. She will be seen by Dr. Emelia Loron on January 13 to decide the exact date of her surgery. She  has had MRI of the breasts performed. She denies any nausea vomiting fevers chills night sweats headaches shortness of breath chest pains palpitation no myalgias or arthralgias.  MEDICAL HISTORY: Past Medical History  Diagnosis Date  . Anemia   . SVT (supraventricular tachycardia)   . Breast cancer     ALLERGIES:  is allergic to cephalosporins and codeine.  MEDICATIONS:  Current Outpatient Prescriptions  Medication Sig Dispense Refill  . lidocaine-prilocaine (EMLA) cream Apply topically as needed. Apply to port  as directed  30 g  6  . LORazepam (ATIVAN) 0.5 MG tablet Take 1 tablet (0.5 mg total) by mouth every 6 (six) hours as needed (Nausea or vomiting).  30 tablet  0  . metoprolol succinate (TOPROL-XL) 100 MG 24 hr tablet Take 1 tablet (100 mg total) by mouth daily.  90 tablet  1  . ondansetron (ZOFRAN) 8 MG tablet Take 1 tablet two times a day for 2 days starting the day after chemo, then take two times a day as needed for nausea or vomiting.  30 tablet  1  . oxyCODONE-acetaminophen (ROXICET) 5-325 MG per tablet Take 1 tablet by mouth every 4 (four) hours as needed for pain.  30 tablet  0  . prochlorperazine (COMPAZINE) 10 MG tablet Take 1 tablet (10 mg total) by mouth every 6 (six) hours as needed (Nausea or vomiting).  30 tablet  1  . ciprofloxacin (CIPRO) 500 MG tablet Take 1 tablet (500 mg total) by mouth 2 (two) times daily.  14 tablet  6  . dexamethasone (DECADRON) 4 MG tablet Take 2 tabs two times a day the day before Taxotere chemo. Then take 2 tabs daily starting the day after chemo for 2 days.  30 tablet  1  . loratadine (CLARITIN) 10 MG tablet Take 10 mg by mouth daily.      . prochlorperazine (COMPAZINE) 25 MG suppository Place 1 suppository (25 mg total) rectally every 12 (twelve) hours as needed for nausea.  12 suppository  3    SURGICAL HISTORY:  Past Surgical History  Procedure Date  . Dilation and curettage of uterus   . Eye  surgery     age 73-lt eye growth  . Colonoscopy   . Portacath placement 12/23/2011    Procedure: INSERTION PORT-A-CATH;  Surgeon: Emelia Loron, MD;  Location: [facility];  Service: General;  Laterality: Right;    REVIEW OF SYSTEMS:   General: fatigue (-), night sweats (-), fever (-), pain (-) Lymph: palpable nodes (-) HEENT: vision changes (-), mucositis (-), gum bleeding (-), epistaxis (-) Cardiovascular: chest pain (-), palpitations (-) Pulmonary: shortness of breath (-), dyspnea on exertion (-), cough (-), hemoptysis (-) GI:  Early  satiety (-), melena (-), dysphagia (-), nausea/vomiting (-), diarrhea (-) GU: dysuria (-), hematuria (-), incontinence (-) Musculoskeletal: joint swelling (-), joint pain (-), back pain (-) Neuro: weakness (-), numbness (+), headache (-), confusion (-) Skin: Rash (+), lesions (-), dryness (-) Psych: depression (-), suicidal/homicidal ideation (-), feeling of hopelessness (-)   PHYSICAL EXAMINATION: Blood pressure 103/67, pulse 103, temperature 98.2 F (36.8 C), resp. rate 20, height 5\' 5"  (1.651 m), weight 183 lb 9.6 oz (83.28 kg). Body mass index is 30.55 kg/(m^2). General: Patient is a well appearing female in no acute distress HEENT: PERRLA, sclerae anicteric no conjunctival pallor, moist mucous membranes Neck: supple, no palpable adenopathy Lungs: clear to auscultation bilaterally, no wheezes, rhonchi, or rales Cardiovascular: regular rate rhythm, S1, S2, no murmurs, rubs or gallops Abdomen: Soft, non-tender, non-distended, normoactive bowel sounds, no HSM Extremities: warm and well perfused, no clubbing, cyanosis, or edema Skin: No lesions.  Maculopapular rash on chest wall and upper back.   Neuro: Non-focal Masses in breast difficult to appreciate.   ECOG PERFORMANCE STATUS: 0 - Asymptomatic  LABORATORY DATA: Lab Results  Component Value Date   WBC 10.2 03/11/2012   HGB 10.0* 03/11/2012   HCT 31.1* 03/11/2012   MCV 102.0* 03/11/2012   PLT 106* 03/11/2012      Chemistry      Component Value Date/Time   NA 144 03/03/2012 1122   NA 140 01/08/2012 1921   K 4.0 03/03/2012 1122   K 3.4* 01/08/2012 1921   CL 109* 03/03/2012 1122   CL 102 01/08/2012 1921   CO2 24 03/03/2012 1122   BUN 10.0 03/03/2012 1122   BUN 12 01/08/2012 1921   CREATININE 0.7 03/03/2012 1122   CREATININE 1.00 01/08/2012 1921      Component Value Date/Time   CALCIUM 9.1 03/03/2012 1122   ALKPHOS 64 03/03/2012 1122   AST 16 03/03/2012 1122   ALT 14 03/03/2012 1122   BILITOT 0.59 03/03/2012 1122       RADIOGRAPHIC  STUDIES:  US Breast Left  12/14/2011  *RADIOLOGY REPORT*  Clinical Data:  Abnormal MRI, the second look left breast ultrasound  LEFT BREAST ULTRASOUND  Comparison:  MRI dated 12/11/2011  On physical exam, I palpate normal tissue in the 3 o'clock position of the left breast, 3 cm from the nipple.  Findings: Ultrasound is performed, showing an oval heterogeneously hypoechoic mass with microlobulated margins in the 3 o'clock position, 3 cm the nipple measuring 1.2 x 0.7 x 1.1 cm.  This corresponds to the area of concern on recent MRI.  IMPRESSION: Mass, left breast which biopsy is done and reported separately.  RECOMMENDATION: Ultrasound guided left breast biopsy.  BI-RADS CATEGORY 4:  Suspicious abnormality - biopsy should be considered.   Original Report Authenticated By: Hiram Gash, M.D.    US Breast Left  12/01/2011  *RADIOLOGY REPORT*  Clinical Data:  67 year old female with palpable lump in the upper  left breast discovered on self examination.  Also for annual bilateral mammograms.  DIGITAL DIAGNOSTIC BILATERAL MAMMOGRAM WITH CAD AND LEFT BREAST ULTRASOUND:  Comparison:  03/18/2009 mammograms.  Findings:  A spot compression view of the left breast and routine views of both breasts again demonstrate heterogeneously dense breast tissue. Within the upper left breast, an irregular mass with pleomorphic and linear calcifications is identified.  These calcifications span a length of 2 cm. No other mass, distortion or suspicious calcifications are identified bilaterally. Mammographic images were processed with CAD.  On physical exam, a firm mass is identified at the 12 o'clock position of the left breast 5 cm from the nipple.  Ultrasound is performed, showing a 10 x 9 x 8 mm irregular hypoechoic mass at the 12 o'clock position of the left breast 5 cm from the nipple.  A 3 mm satellite nodule lies 3 mm superiomedial to the dominant mass. No definite enlarged abnormal appearing left axillary lymph nodes are  identified.  IMPRESSION: 10 x 9 x 8 mm irregular mass in the upper left breast identified sonographically which likely represents the mammographic finding, as well as an adjacent 3 mm satellite nodule.  These findings are very worrisome for malignancy and tissue sampling is recommended.  These findings were discussed with the patient and her questions answered.  She was encouraged to begin/continue monthly self exams and to contact her primary physician if any changes noted.  BI-RADS CATEGORY 5:  Highly suggestive of malignancy - appropriate action should be taken.  RECOMMENDATION: Ultrasound guided left breast biopsy.  This has been scheduled for 12/07/2011 and the patient informed.   Original Report Authenticated By: Rosendo Gros, M.D.    Mr Breast Bilateral W Wo Contrast  12/11/2011  *RADIOLOGY REPORT*  Clinical Data: Recently biopsy-proven left breast invasive ductal carcinoma manifesting as a palpable left breast mass 12 o'clock location.  BILATERAL BREAST MRI WITH AND WITHOUT CONTRAST  Technique: Multiplanar, multisequence MR images of both breasts were obtained prior to and following the intravenous administration of 20ml of Multihance.  Three dimensional images were evaluated at the independent DynaCad workstation.  Comparison:  Prior mammograms and ultrasound  Findings: Post biopsy change is noted in the left breast 12 o'clock location as well as clip artifact corresponding to the biopsy- proven breast cancer.  No other abnormal T2-weighted hyperintensity on either side.  No lymphadenopathy.  Background parenchymal enhancement type pattern is mild.  In the left breast 12 o'clock location is an irregular spiculated enhancing mass with plateau type kinetics measuring 2.7 x 2.2 x 2.0 cm.  This corresponds to the biopsy-proven breast cancer.  In the left breast three o'clock location, there is a mildly irregular enhancing mass plateau type enhancing kinetics measuring 1.1 x 1.1 x 1.0 cm.  No dominant area  of abnormal enhancement seen in the right breast.  IMPRESSION: Abnormal enhancing mass left breast 12 o'clock location at the site of biopsy-proven breast cancer.  A second abnormally enhancing mass in the left breast in the 3 o'clock location is identified; second look ultrasound is recommended for further evaluation and possible ultrasound-guided biopsy.  If no sonographic correlate is identified, MRI guided core biopsy would be recommended.  No evidence for malignancy in the right breast.  RECOMMENDATION: BI-RADS CATEGORY 4:  Suspicious abnormality - biopsy should be considered.  THREE-DIMENSIONAL MR IMAGE RENDERING ON INDEPENDENT WORKSTATION:  Three-dimensional MR images were rendered by post-processing of the original MR data on an independent workstation.  The three- dimensional MR  images were interpreted, and findings were reported in the accompanying complete MRI report for this study.  We will contact the patient to arrange for follow-up ultrasound.   Original Report Authenticated By: Harrel Lemon, M.D.    [place] Core Biopsy  12/17/2011  **ADDENDUM** CREATED: 12/15/2011 12:01:32  Histologic evaluation demonstrates invasive and in situ mammary carcinoma.  Grade II ductal carcinoma is favored.  This is concordant with the imaging findings.  Results were discussed with the patient by telephone at her request.  She reports no complications from the procedure.  She is scheduled to be seen in the [facility] on 12/16/2011.  **END ADDENDUM** SIGNED BY: Cain Saupe, M.D.   12/15/2011  **ADDENDUM** CREATED: 12/15/2011 12:01:32  Histologic evaluation demonstrates invasive and in situ mammary carcinoma.  Grade II ductal carcinoma is favored.  This is concordant with the imaging findings.  Results were discussed with the patient by telephone at her request.  She reports no complications from the procedure.  She is scheduled to be seen in the [facility] on 12/16/2011.  **END ADDENDUM** SIGNED BY: Cain Saupe, M.D.   12/14/2011  *RADIOLOGY REPORT*  Clinical Data:  New diagnosis left sided breast cancer.  Abnormal MRI.  ULTRASOUND GUIDED CORE BIOPSY OF THE left BREAST  Comparison: Previous exams.  I met with the patient and we discussed the procedure of ultrasound- guided biopsy, including benefits and alternatives.  We discussed the high likelihood of a successful procedure. We discussed the risks of the procedure, including infection, bleeding, tissue injury, clip migration, and inadequate sampling.  Informed written consent was given.  Using sterile technique 2% lidocaine, ultrasound guidance and a 14 gauge automated biopsy device, biopsy was performed of the mass using a  lateral approach.  At the conclusion of the procedure a wing tissue marker clip was deployed into the biopsy cavity. Follow up 2 view mammogram was performed and dictated separately.  IMPRESSION: Ultrasound guided biopsy of a left breast mass.  No apparent complications.  Original Report Authenticated By: Daryl Eastern, M.D.    [place] Core Biopsy  12/04/2011  *RADIOLOGY REPORT*  Clinical Data:  Suspicious left breast mass 12 o'clock location  ULTRASOUND GUIDED VACUUM ASSISTED CORE BIOPSY OF THE LEFT BREAST  Comparison: Previous exams.  I met with the patient and we discussed the procedure of ultrasound- guided biopsy, including benefits and alternatives.  We discussed the high likelihood of a successful procedure. We discussed the risks of the procedure including infection, bleeding, tissue injury, clip migration, and inadequate sampling.  Informed written consent was given.  Using sterile technique, 2% lidocaine ultrasound guidance and a 12 gauge vacuum assisted needle biopsy was performed of left breast mass 12 o'clock location using a lateral to medial approach.  At the conclusion of the procedure, a ribbon shaped tissue marker clip was deployed into the  biopsy cavity.  Follow-up 2-view mammogram was performed and dictated separately.  IMPRESSION:  Ultrasound-guided biopsy of left breast mass 12 o'clock location with ribbon shaped clip placement.  Pathology is pending.  No apparent complications.   Original Report Authenticated By: Harrel Lemon, M.D.    Dg Chest Port 1 View  12/23/2011  *RADIOLOGY REPORT*  Clinical Data: Postop for Port-A-Cath placement.  Rule out pneumothorax.  PORTABLE CHEST - 1 VIEW  Comparison: 11/03/2005  Findings: Right-sided Port-A-Cath terminates at the mid to low SVC. No pneumothorax.  There is biapical pleural thickening.  Midline trachea.  Normal heart size and mediastinal contours.  No pleural fluid. Clear lungs.  IMPRESSION: New right-sided Port-A-Cath, without pneumothorax.   Original Report Authenticated By: Consuello Bossier, M.D.    Mm Digital Diagnostic Bilat  12/01/2011  *RADIOLOGY REPORT*  Clinical Data:  67 year old female with palpable lump in the upper left breast discovered on self examination.  Also for annual bilateral mammograms.  DIGITAL DIAGNOSTIC BILATERAL MAMMOGRAM WITH CAD AND LEFT BREAST ULTRASOUND:  Comparison:  03/18/2009 mammograms.  Findings:  A spot compression view of the left breast and routine views of both breasts again demonstrate heterogeneously dense breast tissue. Within the upper left breast, an irregular mass with pleomorphic and linear calcifications is identified.  These calcifications span a length of 2 cm. No other mass, distortion or suspicious calcifications are identified bilaterally. Mammographic images were processed with CAD.  On physical exam, a firm mass is identified at the 12 o'clock position of the left breast 5 cm from the nipple.  Ultrasound is performed, showing a 10 x 9 x 8 mm irregular hypoechoic mass at the 12 o'clock position of the left breast 5 cm from the nipple.  A 3 mm satellite nodule lies 3 mm superiomedial to the dominant mass. No definite enlarged abnormal  appearing left axillary lymph nodes are identified.  IMPRESSION: 10 x 9 x 8 mm irregular mass in the upper left breast identified sonographically which likely represents the mammographic finding, as well as an adjacent 3 mm satellite nodule.  These findings are very worrisome for malignancy and tissue sampling is recommended.  These findings were discussed with the patient and her questions answered.  She was encouraged to begin/continue monthly self exams and to contact her primary physician if any changes noted.  BI-RADS CATEGORY 5:  Highly suggestive of malignancy - appropriate action should be taken.  RECOMMENDATION: Ultrasound guided left breast biopsy.  This has been scheduled for 12/07/2011 and the patient informed.   Original Report Authenticated By: Rosendo Gros, M.D.    Mm Digital Diagnostic Unilat L  12/14/2011  *RADIOLOGY REPORT*  Clinical Data:  Known malignancy, left breast.  Ultrasound core biopsy for an area seen on recent MRI.  DIGITAL DIAGNOSTIC LEFT MAMMOGRAM  Comparison:  Previous exams.  Findings:  Films are performed following ultrasound guided biopsy of a left breast mass.  Images show a wing type clip in association with the mass seen sonographically in the 3 o'clock position of the left breast.  The clip is 6.8 cm lateral and inferior to the known malignancy.  IMPRESSION: Adequate clip placement following ultrasound guided left breast biopsy.   Original Report Authenticated By: Hiram Gash, M.D.    Mm Digital Diagnostic Unilat L  12/04/2011  *RADIOLOGY REPORT*  Clinical Data:  Ultrasound-guided left breast biopsy with clip placement  DIGITAL DIAGNOSTIC LEFT MAMMOGRAM  Comparison:  Previous exams.  Findings:  Films are performed following ultrasound guided biopsy of left breast mass 12 o'clock location.  The ribbon shaped clip is appropriately located at the site of the mammographically evident mass in the 12 o'clock location with associated calcifications.  IMPRESSION:  Appropriate ribbon shaped clip location at the site of the biopsied left breast 12 o'clock location mass.   Original Report Authenticated By: Harrel Lemon, M.D.    Mm Radiologist Eval And Mgmt  12/07/2011  *RADIOLOGY REPORT*  Clinical Data: Post biopsy evaluation  CONSULTATION  Comparison: December 01, 2011, March 18, 2009  Findings: The pathology revealed invasive ductal carcinoma and ductal carcinoma  ins situ.  This is found to be concordant with imaging findings.  I discussed the results with the the patient. The biopsy site is clean.  There is no complications.  The patient states she is doing well without problems post biopsy.  The patient has appointment for [facility] clinic on December 16, 2011.  The patient has appointment for MRI of the breasts on December 11, 2011.  IMPRESSION: Post biopsy consultation as described.  Recommend MRI of the breasts and and  [facility] as described.   Original Report Authenticated By: Sherian Rein, M.D.    Dg Fluoro Guide Cv Line-no Report  12/23/2011  CLINICAL DATA: port placement   FLOURO GUIDE CV LINE  Fluoroscopy was utilized by the requesting physician.  No radiographic  interpretation.     ASSESSMENT: 67 year old female with new diagnosis of  #1 stage II invasive mammary carcinoma of the left breast that is ER positive PR positive HER-2/neu positive with Ki-67 94%. Patient is interested in breast conservation. There for she will begin neoadjuvant chemotherapy consisting of perjeta, Herceptin, and Taxotere. This will be given every 21 days for a total of 4 cycles. After that we will plan on getting MRI of the breasts performed to evaluate response.She has now completed all of her therapy on 03/04/2012.  #2 repeat MRI reveals perfect response to neoadjuvant therapy.  #3 she will now proceed with her surgery. She has an appointment set up to be seen by Dr. Emelia Loron on January 13 in followup   PLAN:  #1 proceed with surgery.  #2 I will plan on  seeing her back on April 08 2011. At that time we will plan on starting her on every 3 week Herceptin to finish out a total of one year of therapy.  All questions were answered. The patient knows to call the clinic with any problems, questions or concerns. We can certainly see the patient much sooner if necessary.  I spent 25 minutes counseling the patient face to face. The total time spent in the appointment was 30 minutes.

## 2012-03-11 NOTE — Telephone Encounter (Signed)
Per staff message and POF I have scheduled appts.  JMW  

## 2012-03-11 NOTE — Telephone Encounter (Signed)
Pt appts made and printed for pt  email to [facility] for tx to be added and pt is aware    anne

## 2012-03-11 NOTE — Patient Instructions (Signed)
Dehydration, Adult Dehydration is when you lose more fluids from the body than you take in. Vital organs like the kidneys, brain, and heart cannot function without a proper amount of fluids and salt. Any loss of fluids from the body can cause dehydration.  CAUSES   Vomiting.  Diarrhea.  Excessive sweating.  Excessive urine output.  Fever. SYMPTOMS  Mild dehydration  Thirst.  Dry lips.  Slightly dry mouth. Moderate dehydration  Very dry mouth.  Sunken eyes.  Skin does not bounce back quickly when lightly pinched and released.  Dark urine and decreased urine production.  Decreased tear production.  Headache. Severe dehydration  Very dry mouth.  Extreme thirst.  Rapid, weak pulse (more than 100 beats per minute at rest).  Cold hands and feet.  Not able to sweat in spite of heat and temperature.  Rapid breathing.  Blue lips.  Confusion and lethargy.  Difficulty being awakened.  Minimal urine production.  No tears. DIAGNOSIS  Your caregiver will diagnose dehydration based on your symptoms and your exam. Blood and urine tests will help confirm the diagnosis. The diagnostic evaluation should also identify the cause of dehydration. TREATMENT  Treatment of mild or moderate dehydration can often be done at home by increasing the amount of fluids that you drink. It is best to drink small amounts of fluid more often. Drinking too much at one time can make vomiting worse. Refer to the home care instructions below. Severe dehydration needs to be treated at the hospital where you will probably be given intravenous (IV) fluids that contain water and electrolytes. HOME CARE INSTRUCTIONS   Ask your caregiver about specific rehydration instructions.  Drink enough fluids to keep your urine clear or pale yellow.  Drink small amounts frequently if you have nausea and vomiting.  Eat as you normally do.  Avoid:  Foods or drinks high in sugar.  Carbonated  drinks.  Juice.  Extremely hot or cold fluids.  Drinks with caffeine.  Fatty, greasy foods.  Alcohol.  Tobacco.  Overeating.  Gelatin desserts.  Wash your hands well to avoid spreading bacteria and viruses.  Only take over-the-counter or prescription medicines for pain, discomfort, or fever as directed by your caregiver.  Ask your caregiver if you should continue all prescribed and over-the-counter medicines.  Keep all follow-up appointments with your caregiver. SEEK MEDICAL CARE IF:  You have abdominal pain and it increases or stays in one area (localizes).  You have a rash, stiff neck, or severe headache.  You are irritable, sleepy, or difficult to awaken.  You are weak, dizzy, or extremely thirsty. SEEK IMMEDIATE MEDICAL CARE IF:   You are unable to keep fluids down or you get worse despite treatment.  You have frequent episodes of vomiting or diarrhea.  You have blood or green matter (bile) in your vomit.  You have blood in your stool or your stool looks black and tarry.  You have not urinated in 6 to 8 hours, or you have only urinated a small amount of very dark urine.  You have a fever.  You faint. MAKE SURE YOU:   Understand these instructions.  Will watch your condition.  Will get help right away if you are not doing well or get worse. Document Released: 02/16/2005 Document Revised: 05/11/2011 Document Reviewed: 10/06/2010 [employer] Patient Information 2013 [employer].  

## 2012-03-14 ENCOUNTER — Ambulatory Visit (INDEPENDENT_AMBULATORY_CARE_PROVIDER_SITE_OTHER): Payer: BC Managed Care – PPO | Admitting: General Surgery

## 2012-03-14 ENCOUNTER — Encounter (INDEPENDENT_AMBULATORY_CARE_PROVIDER_SITE_OTHER): Payer: Self-pay | Admitting: General Surgery

## 2012-03-14 VITALS — BP 118/74 | HR 72 | Temp 98.3°F | Resp 16 | Ht 65.0 in | Wt 185.5 lb

## 2012-03-14 DIAGNOSIS — C50219 Malignant neoplasm of upper-inner quadrant of unspecified female breast: Secondary | ICD-10-CM

## 2012-03-14 NOTE — Progress Notes (Signed)
Patient ID: Marie Anderson, female   DOB: 11/08/1945, 67 y.o.   MRN: [id number]  Chief Complaint  Patient presents with  . Results    Discuss MRI     HPI Marie Anderson is a 67 y.o. female.  HPI 67 yof who presented in 10/13 with newly diagnosed multicentric left breast cancer.  She had a 3 and 12 o'clock mass that were biopsied and both were invasive ductal carcinoma.  One was her2 positive and one was her2 negative.  She had no involvement of her lymph nodes from the start.  She has undergone primary chemotherapy and has done well from that.  Her last dose of taxotere was 1/6.  Her MRI that was recently repeated is as shown below. She has had a good response by MRI but the clips still remain 6.8 cm apart.  There continues to be no adenopathy.  She returns today to discuss her options for surgery.   Past Medical History  Diagnosis Date  . Anemia   . SVT (supraventricular tachycardia)   . Breast cancer     Past Surgical History  Procedure Date  . Dilation and curettage of uterus   . Eye surgery     age 7-lt eye growth  . Colonoscopy   . Portacath placement 12/23/2011    Procedure: INSERTION PORT-A-CATH;  Surgeon: Josep Luviano, MD;  Location: [facility];  Service: General;  Laterality: Right;    History reviewed. No pertinent family history.  Social History History  Substance Use Topics  . Smoking status: Never Smoker   . Smokeless tobacco: Never Used  . Alcohol Use: Yes    Allergies  Allergen Reactions  . Cephalosporins     headache  . Codeine Nausea Only    Current Outpatient Prescriptions  Medication Sig Dispense Refill  . metoprolol succinate (TOPROL-XL) 100 MG 24 hr tablet Take 1 tablet (100 mg total) by mouth daily.  90 tablet  1  . oxyCODONE-acetaminophen (ROXICET) 5-325 MG per tablet Take 1 tablet by mouth every 4 (four) hours as needed for pain.  30 tablet  0  . ciprofloxacin (CIPRO) 500 MG tablet Take 1 tablet (500 mg total) by mouth 2  (two) times daily.  14 tablet  6  . lidocaine-prilocaine (EMLA) cream Apply topically as needed. Apply to port as directed  30 g  6  . loratadine (CLARITIN) 10 MG tablet Take 10 mg by mouth daily.        Review of Systems Review of Systems  Constitutional: Negative for fever, chills and unexpected weight change.  HENT: Negative for hearing loss, congestion, sore throat, trouble swallowing and voice change.   Eyes: Negative for visual disturbance.  Respiratory: Negative for cough and wheezing.   Cardiovascular: Negative for chest pain, palpitations and leg swelling.  Gastrointestinal: Negative for nausea, vomiting, abdominal pain, diarrhea, constipation, blood in stool, abdominal distention and anal bleeding.  Genitourinary: Negative for hematuria, vaginal bleeding and difficulty urinating.  Musculoskeletal: Negative for arthralgias.  Skin: Negative for rash and wound.  Neurological: Negative for seizures, syncope and headaches.  Hematological: Negative for adenopathy. Does not bruise/bleed easily.  Psychiatric/Behavioral: Negative for confusion.    Blood pressure 118/74, pulse 72, temperature 98.3 F (36.8 C), temperature source Temporal, resp. rate 16, height 5' 5" (1.651 m), weight 185 lb 8 oz (84.142 kg).  Physical Exam Physical Exam  Vitals reviewed. Constitutional: She appears well-developed and well-nourished.  Eyes: No scleral icterus.  Neck: Neck supple.  Cardiovascular:   Normal rate, regular rhythm and normal heart sounds.   Pulmonary/Chest: Effort normal and breath sounds normal. She has no wheezes. She has no rales. Right breast exhibits no inverted nipple, no mass, no nipple discharge, no skin change and no tenderness. Left breast exhibits no inverted nipple, no mass, no nipple discharge, no skin change and no tenderness. Breasts are symmetrical.    Lymphadenopathy:    She has no cervical adenopathy.    She has no axillary adenopathy.       Right: No supraclavicular  adenopathy present.       Left: No supraclavicular adenopathy present.    Data Reviewed BILATERAL BREAST MRI WITH AND WITHOUT CONTRAST  Technique: Multiplanar, multisequence MR images of both breasts  were obtained prior to and following the intravenous administration  of 18ml of multihance. Three dimensional images were evaluated at  the independent DynaCad workstation.  Comparison: Prior MRI dated 12/11/2011 and mammograms dated  12/14/2011, 12/04/2011, 12/01/2011 and 03/18/2009.  Findings: There is a moderate background parenchymal enhancement  pattern.  There has been good interval response to neoadjuvant chemotherapy.  There is a clip artifact in the middle third of the 12 o'clock  region of the left breast. The previously described 2.7 x 2.2 x 2.0  cm spiculated mass is no longer visualized. In this area is very  faint non mass-like enhancement measuring 2.4 cm in anterior-  posterior dimension.  In the anterior third of the 3 o'clock region of the left breast  there is a biopsy clip artifact. There is a spiculated enhancing  mass measures 0.8 x 0.4 x 0.5 cm. On the prior MRI it measured 1.1  x 1.1 x 1.0 cm.  No abnormal enhancement is seen in the right breast.  There is no enlarged axillary or internal mammary adenopathy.  IMPRESSION:  Good interval response to neoadjuvant chemotherapy, with minimal  enhancement remaining in the 12 o'clock region of the left breast  and interval reduction in the size of the spiculated mass in the 3  o'clock region of the left breast.  RECOMMENDATION:  Treatment planning is recommended.   Assessment    Clinical stage II left breast cancer s/p primary chemotherapy    Plan   With her breast size and the location of these tumors I think the best option is still a mastectomy.  I think this could be combined with a sentinel node biopsy.  I do think she is a candidate for immediate reconstruction.  We discussed all this in detail today.  She  is going to consider plastic surgeon choice and will call me back this week.  I will get her in to see someone soon and we can proceed. She would like to go on a trip to [place] and have her surgery Feb 13/14.  I will talk to her again after she sees plastic surgery.       Davison Ohms 03/14/2012, 3:50 PM    

## 2012-03-15 ENCOUNTER — Telehealth (INDEPENDENT_AMBULATORY_CARE_PROVIDER_SITE_OTHER): Payer: Self-pay

## 2012-03-15 DIAGNOSIS — C50912 Malignant neoplasm of unspecified site of left female breast: Secondary | ICD-10-CM

## 2012-03-15 NOTE — Telephone Encounter (Signed)
Pt calling wanting me to schedule her an appt to see DR Wayland Denis to discuss immediate reconstruction after masty w/DR Dwain Sarna. I will call and make the appt.  I made her an appt with Dr Kelly Splinter for 03/22/12 arrive at 2:30. The pt made aware. The pt will call us after the appt with Dr Kelly Splinter to let us know what she has decided.

## 2012-03-22 ENCOUNTER — Telehealth (INDEPENDENT_AMBULATORY_CARE_PROVIDER_SITE_OTHER): Payer: Self-pay

## 2012-03-22 ENCOUNTER — Other Ambulatory Visit (INDEPENDENT_AMBULATORY_CARE_PROVIDER_SITE_OTHER): Payer: Self-pay | Admitting: General Surgery

## 2012-03-22 DIAGNOSIS — C50219 Malignant neoplasm of upper-inner quadrant of unspecified female breast: Secondary | ICD-10-CM

## 2012-03-22 NOTE — Telephone Encounter (Signed)
LMOM returning pt's call from yesterday. I advised pt that I would be here till 12 o'clock today if she wishes to call me back.

## 2012-03-23 NOTE — Telephone Encounter (Signed)
Pt returned my call. The pt wishes to proceed with [facility] and immediate reconstruction by Dr Kelly Splinter. I turned pt's surgical orders into scheduling.

## 2012-03-23 NOTE — Telephone Encounter (Signed)
Left another message for pt to call me back to discuss surgery.

## 2012-03-25 ENCOUNTER — Telehealth: Payer: Self-pay | Admitting: Medical Oncology

## 2012-03-25 ENCOUNTER — Other Ambulatory Visit: Payer: Self-pay | Admitting: Medical Oncology

## 2012-03-25 NOTE — Telephone Encounter (Signed)
Change infusion/lab/MDvisit to 2 weeks after the surgery

## 2012-03-25 NOTE — Telephone Encounter (Signed)
Patient LVMOM informing office of change in her appt schedule, states mastectomy is scheduled for 04/06/12 and she has a herceptin tx scheduled 04/07/12 (as well as lab/MD appt), asking about changing infusion date. Will forward to MD for schedule review.

## 2012-03-25 NOTE — Telephone Encounter (Signed)
Per MD, onc treatment sent for rescheduling of patients upcoming appts on 04/07/12, to be 2 weeks post surgery.

## 2012-03-30 ENCOUNTER — Encounter (HOSPITAL_BASED_OUTPATIENT_CLINIC_OR_DEPARTMENT_OTHER): Payer: Self-pay | Admitting: *Deleted

## 2012-03-30 ENCOUNTER — Ambulatory Visit (INDEPENDENT_AMBULATORY_CARE_PROVIDER_SITE_OTHER): Payer: BC Managed Care – PPO | Admitting: General Surgery

## 2012-03-30 ENCOUNTER — Other Ambulatory Visit: Payer: Self-pay | Admitting: Plastic Surgery

## 2012-03-30 ENCOUNTER — Encounter (INDEPENDENT_AMBULATORY_CARE_PROVIDER_SITE_OTHER): Payer: Self-pay | Admitting: General Surgery

## 2012-03-30 ENCOUNTER — Other Ambulatory Visit: Payer: Self-pay | Admitting: Emergency Medicine

## 2012-03-30 VITALS — BP 114/68 | HR 84 | Resp 16 | Ht 65.0 in | Wt 190.6 lb

## 2012-03-30 DIAGNOSIS — C50219 Malignant neoplasm of upper-inner quadrant of unspecified female breast: Secondary | ICD-10-CM

## 2012-03-30 DIAGNOSIS — C50919 Malignant neoplasm of unspecified site of unspecified female breast: Secondary | ICD-10-CM

## 2012-03-30 NOTE — Progress Notes (Signed)
Subjective:     Patient ID: Marie Anderson, female   DOB: 10/15/1945, 67 y.o.   MRN: [id number]  HPI 23 yof I saw s/p primary systemic chemotherapy a couple weeks ago.  I think due to breast size and location of tumors her best oncologic option is a mastectomy combined with a sentinel node biopsy. Since I saw her she has seen plastic surgery and have decided to proceed with expander based reconstruction.  She reports no change since I last saw her.  She comes back in today to discuss mastectomy and future therapy.  Review of Systems     Objective:   Physical Exam Deferred today    Assessment:     Multicentric left breast cancer s/p primary systemic chemotherapy    Plan:     We discussed again indication for mastectomy as well as performance. We discussed technical performance of sentinel node biopsy. We discussed small chance of needing to return to or for a positive node for alnd. I think unlikely given exam and mr that she will need xrt postop making reconstruction immediately a good option which she will have with Dr. Kelly Splinter.  I spent about 20 minutes answering all questions prior to surgery next week.

## 2012-03-30 NOTE — Progress Notes (Signed)
To come in for bmet-cbc-did have labs cancer center 03/11/12 after finished chemo-counts were alittle low To bring all meds and overnight bag

## 2012-03-30 NOTE — H&P (Signed)
This document contains confidential information from a [facility] medical record system and may be unauthenticated. Release may be made only with a valid authorization or in accordance with applicable policies of Medical Center or its affiliates. This document must be maintained in a secure manner or discarded/destroyed as required by Medical Center policy or by a confidential means such as shredding.   Marie Anderson   03/29/2012 2:15 PM Office Visit  MRN: [id number]  Department: Plastic Surgery  Dept Phone: [phone]  Description: Female DOB: 10/08/1945  Provider: Ardyce Heyer Sanger, DO   Diagnoses  -  Breast cancer   - Primary    174.9     Reason for Visit    Breast Reconstruction     Vitals - Last Recorded     126/68  85  1.651 m (5' 5")  82.555 kg (182 lb)  30.29 kg/m2     Subjective:    Patient ID: Marie Anderson is a 66 y.o. female.  HPI The patient is a 66 yo wf here for her history and physical for breast reconstruction. She was diagnosed with left sided breast cancer in October 2013. Two different areas were noted: a 3 and 12 o'clock mass with biopsy proven invasive ductal carcinoma. One was Her2 positive and one was Her2 negative. She does not show any lymph node involvement to present. She has undergone primary chemotherapy with her last dose of taxotere January 6. Her MRI done recently shows a very positive response with a decrease in the tumor sizes the clips still remain 6.8 cm apart. She wants to undergo a left mastectomy with immediate reconstruction. She is G3P2 and breast feed her two children. She is 5 feet 5 inches tall and 182 pounds. Her bra size is 38 C and she is ok with a B size cup.  The following portions of the patient's history were reviewed and updated as appropriate: allergies, current medications, past family history, past medical history, past social history, past surgical history and problem list.  Review of Systems  Constitutional:  Negative.   HENT: Negative.   Eyes: Negative.   Respiratory: Negative.   Cardiovascular: Negative.   Gastrointestinal: Negative.   Genitourinary: Negative.   Hematological: Negative.       Objective:    Physical Exam  Constitutional: She appears well-developed and well-nourished.  HENT:   Head: Normocephalic and atraumatic.  Eyes: Conjunctivae normal and EOM are normal. Pupils are equal, round, and reactive to light.  Cardiovascular: Normal rate.   Pulmonary/Chest: Effort normal. No respiratory distress.  Abdominal: Soft.  Musculoskeletal: Normal range of motion.  Skin: Skin is warm.  Psychiatric: She has a normal mood and affect. Her behavior is normal. Judgment and thought content normal.      Assessment:      1.  Breast cancer       Plan:     Plan on left breast reconstruction with tissue expander placement and ADM.  Risks and complications were reviewed and consent signed.    Medications Ordered This Encounter      sulfamethoxazole-trimethoprim (BACTRIM DS) 800-160 mg per tablet Take 1 tablet by mouth 2 times daily for 7 days.    HYDROcodone-acetaminophen (NORCO) 5-325 mg per tablet 30 Take 1 tablet by mouth every 6 (six) hours as needed for 10 days for Pain.    diazepam (VALIUM) 2 MG tablet 20 Take 1 tablet (2 mg total) by mouth every 12 (twelve) hours as needed for 10 days for   Anxiety.    ondansetron (ZOFRAN) 4 MG tablet 20 Take 1 tablet (4 mg total) by mouth every 8 (eight) hours as needed for 7 days for Nausea.   

## 2012-03-31 ENCOUNTER — Encounter (HOSPITAL_BASED_OUTPATIENT_CLINIC_OR_DEPARTMENT_OTHER)
Admission: RE | Admit: 2012-03-31 | Discharge: 2012-03-31 | Disposition: A | Discharge status: Home / Self Care | Payer: BC Managed Care – PPO | Source: Ambulatory Visit | Attending: General Surgery | Admitting: General Surgery

## 2012-03-31 LAB — CBC WITH DIFFERENTIAL/PLATELET
Basophils Relative: 0 % (ref 0–1)
Eosinophils Absolute: 0 10*3/uL (ref 0.0–0.7)
Eosinophils Relative: 1 % (ref 0–5)
Lymphs Abs: 0.8 10*3/uL (ref 0.7–4.0)
MCH: 32.7 pg (ref 26.0–34.0)
MCHC: 30.8 g/dL (ref 30.0–36.0)
MCV: 105.9 fL — ABNORMAL HIGH (ref 78.0–100.0)
Monocytes Relative: 7 % (ref 3–12)
Neutrophils Relative %: 58 % (ref 43–77)
Platelets: 132 10*3/uL — ABNORMAL LOW (ref 150–400)
RBC: 3.03 MIL/uL — ABNORMAL LOW (ref 3.87–5.11)

## 2012-03-31 LAB — BASIC METABOLIC PANEL
BUN: 7 mg/dL (ref 6–23)
CO2: 29 mEq/L (ref 19–32)
Calcium: 8.6 mg/dL (ref 8.4–10.5)
GFR calc non Af Amer: >90 mL/min (ref >90)
Glucose, Bld: 92 mg/dL (ref 70–99)

## 2012-04-01 ENCOUNTER — Telehealth: Payer: Self-pay | Admitting: Oncology

## 2012-04-01 NOTE — Telephone Encounter (Signed)
S/w pt re appts for 2/14 @ 1:30pm.

## 2012-04-04 ENCOUNTER — Ambulatory Visit: Payer: BC Managed Care – PPO

## 2012-04-06 ENCOUNTER — Other Ambulatory Visit: Payer: Self-pay | Admitting: Plastic Surgery

## 2012-04-06 ENCOUNTER — Ambulatory Visit (HOSPITAL_BASED_OUTPATIENT_CLINIC_OR_DEPARTMENT_OTHER): Payer: BC Managed Care – PPO | Admitting: Anesthesiology

## 2012-04-06 ENCOUNTER — Encounter (HOSPITAL_BASED_OUTPATIENT_CLINIC_OR_DEPARTMENT_OTHER): Payer: Self-pay | Admitting: Anesthesiology

## 2012-04-06 ENCOUNTER — Encounter (HOSPITAL_BASED_OUTPATIENT_CLINIC_OR_DEPARTMENT_OTHER)
Admission: RE | Disposition: A | Discharge status: Home / Self Care | Payer: Self-pay | Source: Ambulatory Visit | Attending: General Surgery

## 2012-04-06 ENCOUNTER — Encounter (HOSPITAL_COMMUNITY)
Admission: RE | Admit: 2012-04-06 | Discharge: 2012-04-06 | Disposition: A | Discharge status: Home / Self Care | Payer: BC Managed Care – PPO | Source: Ambulatory Visit | Attending: General Surgery | Admitting: General Surgery

## 2012-04-06 ENCOUNTER — Encounter (HOSPITAL_BASED_OUTPATIENT_CLINIC_OR_DEPARTMENT_OTHER): Payer: Self-pay | Admitting: *Deleted

## 2012-04-06 ENCOUNTER — Encounter (HOSPITAL_BASED_OUTPATIENT_CLINIC_OR_DEPARTMENT_OTHER): Payer: Self-pay | Admitting: Plastic Surgery

## 2012-04-06 ENCOUNTER — Ambulatory Visit (HOSPITAL_BASED_OUTPATIENT_CLINIC_OR_DEPARTMENT_OTHER)
Admission: RE | Admit: 2012-04-06 | Discharge: 2012-04-07 | Disposition: A | Discharge status: Home / Self Care | Payer: BC Managed Care – PPO | Source: Ambulatory Visit | Attending: General Surgery | Admitting: General Surgery

## 2012-04-06 DIAGNOSIS — E860 Dehydration: Secondary | ICD-10-CM

## 2012-04-06 DIAGNOSIS — C50219 Malignant neoplasm of upper-inner quadrant of unspecified female breast: Secondary | ICD-10-CM

## 2012-04-06 DIAGNOSIS — Z01812 Encounter for preprocedural laboratory examination: Secondary | ICD-10-CM | POA: Insufficient documentation

## 2012-04-06 DIAGNOSIS — C50919 Malignant neoplasm of unspecified site of unspecified female breast: Secondary | ICD-10-CM

## 2012-04-06 DIAGNOSIS — D0590 Unspecified type of carcinoma in situ of unspecified breast: Secondary | ICD-10-CM

## 2012-04-06 DIAGNOSIS — R52 Pain, unspecified: Secondary | ICD-10-CM

## 2012-04-06 HISTORY — PX: BREAST RECONSTRUCTION WITH PLACEMENT OF TISSUE EXPANDER AND FLEX HD (ACELLULAR HYDRATED DERMIS): SHX6295

## 2012-04-06 HISTORY — PX: MASTECTOMY W/ SENTINEL NODE BIOPSY: SHX2001

## 2012-04-06 SURGERY — MASTECTOMY WITH SENTINEL LYMPH NODE BIOPSY
Anesthesia: General | Site: Breast | Laterality: Left | Wound class: Clean

## 2012-04-06 MED ORDER — SUCCINYLCHOLINE CHLORIDE 20 MG/ML IJ SOLN
INTRAMUSCULAR | Status: DC | PRN
  Administered 2012-04-06: 100 mg via INTRAVENOUS

## 2012-04-06 MED ORDER — DEXAMETHASONE SODIUM PHOSPHATE 4 MG/ML IJ SOLN
INTRAMUSCULAR | Status: DC | PRN
  Administered 2012-04-06: 8 mg via INTRAVENOUS

## 2012-04-06 MED ORDER — VANCOMYCIN HCL IN DEXTROSE 1-5 GM/200ML-% IV SOLN
1000.0000 mg | INTRAVENOUS | Status: AC
  Administered 2012-04-06: 1000 mg via INTRAVENOUS

## 2012-04-06 MED ORDER — TECHNETIUM TC 99M SULFUR COLLOID FILTERED
1.0000 | Freq: Once | INTRAVENOUS | Status: AC | PRN
  Administered 2012-04-06: 1 via INTRADERMAL

## 2012-04-06 MED ORDER — METOPROLOL SUCCINATE ER 100 MG PO TB24: 100.0000 mg | ORAL_TABLET | Freq: Every day | ORAL | Status: DC

## 2012-04-06 MED ORDER — SODIUM CHLORIDE 0.9 % IV SOLN: Freq: Once | INTRAVENOUS | Status: DC

## 2012-04-06 MED ORDER — MIDAZOLAM HCL 2 MG/2ML IJ SOLN: 1.0000 mg | INTRAMUSCULAR | Status: DC | PRN

## 2012-04-06 MED ORDER — MIDAZOLAM HCL 2 MG/2ML IJ SOLN
1.0000 mg | INTRAMUSCULAR | Status: DC | PRN
  Administered 2012-04-06: 1.5 mg via INTRAVENOUS

## 2012-04-06 MED ORDER — FENTANYL CITRATE 0.05 MG/ML IJ SOLN
25.0000 ug | INTRAMUSCULAR | Status: DC | PRN
  Administered 2012-04-06: 50 ug via INTRAVENOUS

## 2012-04-06 MED ORDER — ONDANSETRON HCL 4 MG/2ML IJ SOLN
INTRAMUSCULAR | Status: DC | PRN
  Administered 2012-04-06: 4 mg via INTRAVENOUS

## 2012-04-06 MED ORDER — FENTANYL CITRATE 0.05 MG/ML IJ SOLN
50.0000 ug | INTRAMUSCULAR | Status: DC | PRN
  Administered 2012-04-06: 25 ug via INTRAVENOUS

## 2012-04-06 MED ORDER — PROPOFOL 10 MG/ML IV BOLUS
INTRAVENOUS | Status: DC | PRN
  Administered 2012-04-06: 160 mg via INTRAVENOUS
  Administered 2012-04-06: 40 mg via INTRAVENOUS

## 2012-04-06 MED ORDER — METHYLENE BLUE 1 % INJ SOLN
INTRAMUSCULAR | Status: DC | PRN
  Administered 2012-04-06: 2 mL via SUBMUCOSAL

## 2012-04-06 MED ORDER — ACETAMINOPHEN 650 MG RE SUPP: 650.0000 mg | Freq: Four times a day (QID) | RECTAL | Status: DC | PRN

## 2012-04-06 MED ORDER — SODIUM CHLORIDE 0.9 % IR SOLN
Status: DC | PRN
  Administered 2012-04-06: 11:00:00

## 2012-04-06 MED ORDER — EPHEDRINE SULFATE 50 MG/ML IJ SOLN
INTRAMUSCULAR | Status: DC | PRN
  Administered 2012-04-06 (×4): 5 mg via INTRAVENOUS

## 2012-04-06 MED ORDER — ACETAMINOPHEN 325 MG PO TABS: 650.0000 mg | ORAL_TABLET | Freq: Four times a day (QID) | ORAL | Status: DC | PRN

## 2012-04-06 MED ORDER — LIDOCAINE HCL (CARDIAC) 20 MG/ML IV SOLN
INTRAVENOUS | Status: DC | PRN
  Administered 2012-04-06: 50 mg via INTRAVENOUS

## 2012-04-06 MED ORDER — FENTANYL CITRATE 0.05 MG/ML IJ SOLN
INTRAMUSCULAR | Status: DC | PRN
  Administered 2012-04-06 (×4): 50 ug via INTRAVENOUS

## 2012-04-06 MED ORDER — OXYCODONE-ACETAMINOPHEN 5-325 MG PO TABS
1.0000 | ORAL_TABLET | ORAL | Status: DC | PRN
  Administered 2012-04-06 – 2012-04-07 (×4): 1 via ORAL

## 2012-04-06 MED ORDER — PROMETHAZINE HCL 25 MG/ML IJ SOLN
12.5000 mg | Freq: Four times a day (QID) | INTRAMUSCULAR | Status: DC | PRN
  Administered 2012-04-06: 12.5 mg via INTRAVENOUS

## 2012-04-06 MED ORDER — CIPROFLOXACIN HCL 500 MG PO TABS
500.0000 mg | ORAL_TABLET | Freq: Two times a day (BID) | ORAL | Status: DC
  Administered 2012-04-06 – 2012-04-07 (×3): 500 mg via ORAL

## 2012-04-06 MED ORDER — FENTANYL CITRATE 0.05 MG/ML IJ SOLN
50.0000 ug | Freq: Once | INTRAMUSCULAR | Status: AC
  Administered 2012-04-06: 75 ug via INTRAVENOUS

## 2012-04-06 MED ORDER — MORPHINE SULFATE 2 MG/ML IJ SOLN: 2.0000 mg | INTRAMUSCULAR | Status: DC | PRN

## 2012-04-06 MED ORDER — PANTOPRAZOLE SODIUM 40 MG IV SOLR: 40.0000 mg | Freq: Every day | INTRAVENOUS | Status: DC

## 2012-04-06 MED ORDER — ONDANSETRON HCL 4 MG/2ML IJ SOLN: 4.0000 mg | Freq: Four times a day (QID) | INTRAMUSCULAR | Status: DC | PRN

## 2012-04-06 MED ORDER — KCL IN DEXTROSE-NACL 20-5-0.45 MEQ/L-%-% IV SOLN
INTRAVENOUS | Status: DC
  Administered 2012-04-06: 13:00:00 via INTRAVENOUS

## 2012-04-06 MED ORDER — DOCUSATE SODIUM 100 MG PO CAPS
100.0000 mg | ORAL_CAPSULE | Freq: Three times a day (TID) | ORAL | Status: DC
  Administered 2012-04-06 (×2): 100 mg via ORAL

## 2012-04-06 MED ORDER — ONDANSETRON HCL 4 MG PO TABS: 4.0000 mg | ORAL_TABLET | Freq: Three times a day (TID) | ORAL | Status: DC | PRN

## 2012-04-06 MED ORDER — LACTATED RINGERS IV SOLN
INTRAVENOUS | Status: DC
  Administered 2012-04-06 (×2): via INTRAVENOUS

## 2012-04-06 SURGICAL SUPPLY — 94 items
ALLODERM 4X16 TRU-THICK (Tissue) ×1 IMPLANT
ALLODERM RTU 4X16 TRU-THICK (Tissue) ×1 IMPLANT
APPLIER CLIP 11 MED OPEN (CLIP)
APPLIER CLIP 9.375 MED OPEN (MISCELLANEOUS)
BAG DECANTER FOR FLEXI CONT (MISCELLANEOUS) ×2 IMPLANT
BANDAGE GAUZE ELAST BULKY 4 IN (GAUZE/BANDAGES/DRESSINGS) ×4 IMPLANT
BENZOIN TINCTURE PRP APPL 2/3 (GAUZE/BANDAGES/DRESSINGS) ×2 IMPLANT
BINDER BREAST LRG (GAUZE/BANDAGES/DRESSINGS) IMPLANT
BINDER BREAST MEDIUM (GAUZE/BANDAGES/DRESSINGS) IMPLANT
BINDER BREAST XLRG (GAUZE/BANDAGES/DRESSINGS) ×2 IMPLANT
BINDER BREAST XXLRG (GAUZE/BANDAGES/DRESSINGS) IMPLANT
BIOPATCH BLUE 3/4IN DISK W/1.5 (GAUZE/BANDAGES/DRESSINGS) ×2 IMPLANT
BLADE HEX COATED 2.75 (ELECTRODE) ×4 IMPLANT
BLADE SURG 10 STRL SS (BLADE) ×2 IMPLANT
BLADE SURG 15 STRL LF DISP TIS (BLADE) ×2 IMPLANT
BLADE SURG 15 STRL SS (BLADE) ×2
BLADE SURG ROTATE 9660 (MISCELLANEOUS) IMPLANT
CANISTER SUCTION 1200CC (MISCELLANEOUS) ×2 IMPLANT
CHLORAPREP W/TINT 26ML (MISCELLANEOUS) ×4 IMPLANT
CLIP APPLIE 11 MED OPEN (CLIP) IMPLANT
CLIP APPLIE 9.375 MED OPEN (MISCELLANEOUS) IMPLANT
CLOTH BEACON ORANGE TIMEOUT ST (SAFETY) ×4 IMPLANT
CORDS BIPOLAR (ELECTRODE) IMPLANT
COVER MAYO STAND STRL (DRAPES) ×4 IMPLANT
COVER PROBE W GEL 5X96 (DRAPES) ×2 IMPLANT
COVER TABLE BACK 60X90 (DRAPES) ×2 IMPLANT
DECANTER SPIKE VIAL GLASS SM (MISCELLANEOUS) IMPLANT
DERMABOND ADVANCED (GAUZE/BANDAGES/DRESSINGS) ×1
DERMABOND ADVANCED .7 DNX12 (GAUZE/BANDAGES/DRESSINGS) ×1 IMPLANT
DRAIN CHANNEL 19F RND (DRAIN) ×2 IMPLANT
DRAPE LAPAROSCOPIC ABDOMINAL (DRAPES) ×2 IMPLANT
DRSG PAD ABDOMINAL 8X10 ST (GAUZE/BANDAGES/DRESSINGS) ×4 IMPLANT
DRSG TEGADERM 2-3/8X2-3/4 SM (GAUZE/BANDAGES/DRESSINGS) ×2 IMPLANT
ELECT BLADE 4.0 EZ CLEAN MEGAD (MISCELLANEOUS) ×2
ELECT REM PT RETURN 9FT ADLT (ELECTROSURGICAL) ×4
ELECTRODE BLDE 4.0 EZ CLN MEGD (MISCELLANEOUS) ×1 IMPLANT
ELECTRODE REM PT RTRN 9FT ADLT (ELECTROSURGICAL) ×2 IMPLANT
EVACUATOR SILICONE 100CC (DRAIN) ×2 IMPLANT
GAUZE SPONGE 4X4 12PLY STRL LF (GAUZE/BANDAGES/DRESSINGS) IMPLANT
GLOVE BIO SURGEON STRL SZ 6.5 (GLOVE) ×4 IMPLANT
GLOVE BIO SURGEON STRL SZ7 (GLOVE) ×2 IMPLANT
GLOVE BIOGEL M STRL SZ7.5 (GLOVE) ×8 IMPLANT
GLOVE BIOGEL PI IND STRL 7.5 (GLOVE) ×1 IMPLANT
GLOVE BIOGEL PI IND STRL 8 (GLOVE) ×2 IMPLANT
GLOVE BIOGEL PI INDICATOR 7.5 (GLOVE) ×1
GLOVE BIOGEL PI INDICATOR 8 (GLOVE) ×2
GLOVE SKINSENSE NS SZ6.5 (GLOVE)
GLOVE SKINSENSE STRL SZ6.5 (GLOVE) IMPLANT
GOWN PREVENTION PLUS XLARGE (GOWN DISPOSABLE) ×6 IMPLANT
IV NS 1000ML (IV SOLUTION)
IV NS 1000ML BAXH (IV SOLUTION) IMPLANT
IV NS 500ML (IV SOLUTION) ×1
IV NS 500ML BAXH (IV SOLUTION) ×1 IMPLANT
KIT FILL MCGHAN 30CC (MISCELLANEOUS) IMPLANT
NDL SAFETY ECLIPSE 18X1.5 (NEEDLE) ×1 IMPLANT
NEEDLE HYPO 18GX1.5 SHARP (NEEDLE) ×1
NEEDLE HYPO 25X1 1.5 SAFETY (NEEDLE) ×2 IMPLANT
NS IRRIG 1000ML POUR BTL (IV SOLUTION) ×2 IMPLANT
PACK BASIN DAY SURGERY FS (CUSTOM PROCEDURE TRAY) ×2 IMPLANT
PENCIL BUTTON HOLSTER BLD 10FT (ELECTRODE) ×4 IMPLANT
PIN SAFETY STERILE (MISCELLANEOUS) ×4 IMPLANT
SET ASEPTIC TRANSFER (MISCELLANEOUS) ×2 IMPLANT
SHEET MEDIUM DRAPE 40X70 STRL (DRAPES) ×2 IMPLANT
SLEEVE SCD COMPRESS KNEE MED (MISCELLANEOUS) ×2 IMPLANT
SPONGE LAP 18X18 X RAY DECT (DISPOSABLE) ×6 IMPLANT
SPONGE LAP 4X18 X RAY DECT (DISPOSABLE) IMPLANT
STAPLER VISISTAT 35W (STAPLE) ×2 IMPLANT
STOCKINETTE IMPERVIOUS LG (DRAPES) IMPLANT
STRIP CLOSURE SKIN 1/2X4 (GAUZE/BANDAGES/DRESSINGS) ×2 IMPLANT
SUT ETHILON 2 0 FS 18 (SUTURE) ×2 IMPLANT
SUT MNCRL AB 4-0 PS2 18 (SUTURE) IMPLANT
SUT MON AB 5-0 PS2 18 (SUTURE) ×2 IMPLANT
SUT PDS 3-0 CT2 (SUTURE) ×6
SUT PDS AB 2-0 CT2 27 (SUTURE) IMPLANT
SUT PDS II 3-0 CT2 27 ABS (SUTURE) ×3 IMPLANT
SUT SILK 2 0 SH (SUTURE) IMPLANT
SUT SILK 3 0 PS 1 (SUTURE) ×2 IMPLANT
SUT VIC AB 3-0 54X BRD REEL (SUTURE) IMPLANT
SUT VIC AB 3-0 BRD 54 (SUTURE)
SUT VIC AB 3-0 SH 27 (SUTURE) ×2
SUT VIC AB 3-0 SH 27X BRD (SUTURE) ×2 IMPLANT
SUT VICRYL 3-0 CR8 SH (SUTURE) ×2 IMPLANT
SUT VICRYL 4-0 PS2 18IN ABS (SUTURE) ×2 IMPLANT
SYR 50ML LL SCALE MARK (SYRINGE) IMPLANT
SYR BULB IRRIGATION 50ML (SYRINGE) ×2 IMPLANT
SYR CONTROL 10ML LL (SYRINGE) ×2 IMPLANT
TISSUE ALLDRM RTU 4X16 TRU-TH (Tissue) ×1 IMPLANT
TISSUE EXPANDER 350CC (Miscellaneous) ×2 IMPLANT
TOWEL OR 17X24 6PK STRL BLUE (TOWEL DISPOSABLE) ×4 IMPLANT
TOWEL OR NON WOVEN STRL DISP B (DISPOSABLE) ×2 IMPLANT
TUBE CONNECTING 20X1/4 (TUBING) ×4 IMPLANT
UNDERPAD 30X30 INCONTINENT (UNDERPADS AND DIAPERS) ×4 IMPLANT
WATER STERILE IRR 1000ML POUR (IV SOLUTION) ×2 IMPLANT
YANKAUER SUCT BULB TIP NO VENT (SUCTIONS) ×4 IMPLANT

## 2012-04-06 NOTE — Interval H&P Note (Signed)
History and Physical Interval Note:  04/06/2012 8:36 AM  Marie Anderson  has presented today for surgery, with the diagnosis of left breast cancer  The various methods of treatment have been discussed with the patient and family. After consideration of risks, benefits and other options for treatment, the patient has consented to  Procedure(s) (LRB) with comments: MASTECTOMY WITH SENTINEL LYMPH NODE BIOPSY (Left) - left mastectomy, left axillary sentinel node biopsy BREAST RECONSTRUCTION WITH PLACEMENT OF TISSUE EXPANDER AND FLEX HD (ACELLULAR HYDRATED DERMIS) (Left) - IMMEDIATE LEFT BREAST RECONSTRUCTION WITH PLACEMENT OF TISSUE EXPANDER AND ALLODERM as a surgical intervention .  The patient's history has been reviewed, patient examined, no change in status, stable for surgery.  I have reviewed the patient's chart and labs.  Questions were answered to the patient's satisfaction.     SANGER,Yuvraj Pfeifer

## 2012-04-06 NOTE — Op Note (Deleted)
NAMEKENDEL, PESNELL              ACCOUNT NO.:  [id number]  MEDICAL RECORD NO.:  [id number]  LOCATION:  [facility]                          FACILITY:  [facility]  PHYSICIAN:  Wayland Denis, DO      DATE OF BIRTH:  [birth year]  DATE OF PROCEDURE:  04/06/2012 DATE OF DISCHARGE:  04/07/2012                              OPERATIVE REPORT   PREOPERATIVE DIAGNOSIS:  Left breast cancer.  POSTOPERATIVE DIAGNOSIS:  Left breast cancer.  PROCEDURE:  Immediate left breast reconstruction with expander and AlloDerm placement.  AlloDerm 4 x 16, expander 350 medium height mentor expander.  SURGEON:  Wayland Denis, DO.  ASSISTANT:  Harrie Foreman, LPN  ANESTHESIA:  General.  INDICATION FOR PROCEDURE:  The patient is a 67 year old female was diagnosed with left breast cancer.  She made the decision to undergo mastectomy with immediate reconstruction.  Risks and complications were reviewed and documented.  Consent was signed and confirmed.  DESCRIPTION OF PROCEDURE:  The patient was taken to the operating room by General Surgery.  They performed their portion of the case, which included a left mastectomy and sentinel lymph node.  Once that was finished, the patient was rendered to the Plastic and Reconstructive Surgery team.  A time-out was called.  All information was confirmed to be correct.  She was redraped.  The pocket was irrigated with antibiotic solution and warm saline.  Hemostasis was achieved with electrocautery. The pectoralis major muscle was lifted off the chest wall to create a pocket.  The 350 mentor medium height expander was selected and evacuated of air, prepared according to the manufacture guidelines.  The expander was placed underneath the pectoralis major muscle and tacked laterally with a 2-0 PDS.  The AlloDerm 4 x 16 was selected, prepared according to the manufacture guidelines.  Several holes were placed to help with postop drainage.  The AlloDerm was then placed with the  shiny side deep and the porous side superficial and secured to the pectoralis major muscle along the inferior lateral border with 2-0 PDS and then to the inframammary fold and laterally with a 2-0 PDS.  A 200 mL of saline was placed in the expander.  A drain was placed using a stab incision with a #15 blade and secured with 3-0 silk.  Deep layers were closed with 3-0 Vicryl followed by 4-0 Vicryl and a running subcuticular 5-0 Monocryl.  Dermabond was then applied.  ABDs, 4 x 4s, and a Bio-patch was placed with a breast binder. The patient tolerated the procedure well.  Due to the excess skin, the dog ears were removed laterally and medially, 2 cm on each and prior to closure.  The patient was allowed to wait and extubated, taken to recovery in stable condition.     Wayland Denis, DO     CS/MEDQ  D:  04/06/2012  T:  04/06/2012  Job:  [id number]

## 2012-04-06 NOTE — H&P (View-Only) (Signed)
This document contains confidential information from a [facility] medical record system and may be unauthenticated. Release may be made only with a valid authorization or in accordance with applicable policies of Medical Center or its affiliates. This document must be maintained in a secure manner or discarded/destroyed as required by Medical Center policy or by a confidential means such as shredding.   Marie Anderson   03/29/2012 2:15 PM Office Visit  MRN: [id number]  Department: Plastic Surgery  Dept Phone: [phone]  Description: Female DOB: Jul 07, 1945  Provider: Wayland Denis, DO   Diagnoses  -  Breast cancer   - Primary    174.9     Reason for Visit    Breast Reconstruction     Vitals - Last Recorded     126/68  85  1.651 m (5\' 5" )  82.555 kg (182 lb)  30.29 kg/m2     Subjective:    Patient ID: Marie Anderson is a 67 y.o. female.  HPI The patient is a 67 yo wf here for her history and physical for breast reconstruction. She was diagnosed with left sided breast cancer in October 2013. Two different areas were noted: a 3 and 12 o'clock mass with biopsy proven invasive ductal carcinoma. One was Her2 positive and one was Her2 negative. She does not show any lymph node involvement to present. She has undergone primary chemotherapy with her last dose of taxotere January 6. Her MRI done recently shows a very positive response with a decrease in the tumor sizes the clips still remain 6.8 cm apart. She wants to undergo a left mastectomy with immediate reconstruction. She is G3P2 and breast feed her two children. She is 5 feet 5 inches tall and 182 pounds. Her bra size is 38 C and she is ok with a B size cup.  The following portions of the patient's history were reviewed and updated as appropriate: allergies, current medications, past family history, past medical history, past social history, past surgical history and problem list.  Review of Systems  Constitutional:  Negative.   HENT: Negative.   Eyes: Negative.   Respiratory: Negative.   Cardiovascular: Negative.   Gastrointestinal: Negative.   Genitourinary: Negative.   Hematological: Negative.       Objective:    Physical Exam  Constitutional: She appears well-developed and well-nourished.  HENT:   Head: Normocephalic and atraumatic.  Eyes: Conjunctivae normal and EOM are normal. Pupils are equal, round, and reactive to light.  Cardiovascular: Normal rate.   Pulmonary/Chest: Effort normal. No respiratory distress.  Abdominal: Soft.  Musculoskeletal: Normal range of motion.  Skin: Skin is warm.  Psychiatric: She has a normal mood and affect. Her behavior is normal. Judgment and thought content normal.      Assessment:      1.  Breast cancer       Plan:     Plan on left breast reconstruction with tissue expander placement and ADM.  Risks and complications were reviewed and consent signed.    Medications Ordered This Encounter      sulfamethoxazole-trimethoprim (BACTRIM DS) 800-160 mg per tablet Take 1 tablet by mouth 2 times daily for 7 days.    HYDROcodone-acetaminophen (NORCO) 5-325 mg per tablet 30 Take 1 tablet by mouth every 6 (six) hours as needed for 10 days for Pain.    diazepam (VALIUM) 2 MG tablet 20 Take 1 tablet (2 mg total) by mouth every 12 (twelve) hours as needed for 10 days for  Anxiety.    ondansetron (ZOFRAN) 4 MG tablet 20 Take 1 tablet (4 mg total) by mouth every 8 (eight) hours as needed for 7 days for Nausea.

## 2012-04-06 NOTE — H&P (View-Only) (Signed)
Patient ID: Marie Anderson, female   DOB: 14-Jun-1945, 67 y.o.   MRN: [id number]  Chief Complaint  Patient presents with  . Results    Discuss MRI     HPI Marie Anderson is a 67 y.o. female.  HPI 32 yof who presented in 10/13 with newly diagnosed multicentric left breast cancer.  She had a 3 and 12 o'clock mass that were biopsied and both were invasive ductal carcinoma.  One was her2 positive and one was her2 negative.  She had no involvement of her lymph nodes from the start.  She has undergone primary chemotherapy and has done well from that.  Her last dose of taxotere was 1/6.  Her MRI that was recently repeated is as shown below. She has had a good response by MRI but the clips still remain 6.8 cm apart.  There continues to be no adenopathy.  She returns today to discuss her options for surgery.   Past Medical History  Diagnosis Date  . Anemia   . SVT (supraventricular tachycardia)   . Breast cancer     Past Surgical History  Procedure Date  . Dilation and curettage of uterus   . Eye surgery     age 79-lt eye growth  . Colonoscopy   . Portacath placement 12/23/2011    Procedure: INSERTION PORT-A-CATH;  Surgeon: Emelia Loron, MD;  Location: [facility];  Service: General;  Laterality: Right;    History reviewed. No pertinent family history.  Social History History  Substance Use Topics  . Smoking status: Never Smoker   . Smokeless tobacco: Never Used  . Alcohol Use: Yes    Allergies  Allergen Reactions  . Cephalosporins     headache  . Codeine Nausea Only    Current Outpatient Prescriptions  Medication Sig Dispense Refill  . metoprolol succinate (TOPROL-XL) 100 MG 24 hr tablet Take 1 tablet (100 mg total) by mouth daily.  90 tablet  1  . oxyCODONE-acetaminophen (ROXICET) 5-325 MG per tablet Take 1 tablet by mouth every 4 (four) hours as needed for pain.  30 tablet  0  . ciprofloxacin (CIPRO) 500 MG tablet Take 1 tablet (500 mg total) by mouth 2  (two) times daily.  14 tablet  6  . lidocaine-prilocaine (EMLA) cream Apply topically as needed. Apply to port as directed  30 g  6  . loratadine (CLARITIN) 10 MG tablet Take 10 mg by mouth daily.        Review of Systems Review of Systems  Constitutional: Negative for fever, chills and unexpected weight change.  HENT: Negative for hearing loss, congestion, sore throat, trouble swallowing and voice change.   Eyes: Negative for visual disturbance.  Respiratory: Negative for cough and wheezing.   Cardiovascular: Negative for chest pain, palpitations and leg swelling.  Gastrointestinal: Negative for nausea, vomiting, abdominal pain, diarrhea, constipation, blood in stool, abdominal distention and anal bleeding.  Genitourinary: Negative for hematuria, vaginal bleeding and difficulty urinating.  Musculoskeletal: Negative for arthralgias.  Skin: Negative for rash and wound.  Neurological: Negative for seizures, syncope and headaches.  Hematological: Negative for adenopathy. Does not bruise/bleed easily.  Psychiatric/Behavioral: Negative for confusion.    Blood pressure 118/74, pulse 72, temperature 98.3 F (36.8 C), temperature source Temporal, resp. rate 16, height 5\' 5"  (1.651 m), weight 185 lb 8 oz (84.142 kg).  Physical Exam Physical Exam  Vitals reviewed. Constitutional: She appears well-developed and well-nourished.  Eyes: No scleral icterus.  Neck: Neck supple.  Cardiovascular:  Normal rate, regular rhythm and normal heart sounds.   Pulmonary/Chest: Effort normal and breath sounds normal. She has no wheezes. She has no rales. Right breast exhibits no inverted nipple, no mass, no nipple discharge, no skin change and no tenderness. Left breast exhibits no inverted nipple, no mass, no nipple discharge, no skin change and no tenderness. Breasts are symmetrical.    Lymphadenopathy:    She has no cervical adenopathy.    She has no axillary adenopathy.       Right: No supraclavicular  adenopathy present.       Left: No supraclavicular adenopathy present.    Data Reviewed BILATERAL BREAST MRI WITH AND WITHOUT CONTRAST  Technique: Multiplanar, multisequence MR images of both breasts  were obtained prior to and following the intravenous administration  of 18ml of multihance. Three dimensional images were evaluated at  the independent DynaCad workstation.  Comparison: Prior MRI dated 12/11/2011 and mammograms dated  12/14/2011, 12/04/2011, 12/01/2011 and 03/18/2009.  Findings: There is a moderate background parenchymal enhancement  pattern.  There has been good interval response to neoadjuvant chemotherapy.  There is a clip artifact in the middle third of the 12 o'clock  region of the left breast. The previously described 2.7 x 2.2 x 2.0  cm spiculated mass is no longer visualized. In this area is very  faint non mass-like enhancement measuring 2.4 cm in anterior-  posterior dimension.  In the anterior third of the 3 o'clock region of the left breast  there is a biopsy clip artifact. There is a spiculated enhancing  mass measures 0.8 x 0.4 x 0.5 cm. On the prior MRI it measured 1.1  x 1.1 x 1.0 cm.  No abnormal enhancement is seen in the right breast.  There is no enlarged axillary or internal mammary adenopathy.  IMPRESSION:  Good interval response to neoadjuvant chemotherapy, with minimal  enhancement remaining in the 12 o'clock region of the left breast  and interval reduction in the size of the spiculated mass in the 3  o'clock region of the left breast.  RECOMMENDATION:  Treatment planning is recommended.   Assessment    Clinical stage II left breast cancer s/p primary chemotherapy    Plan   With her breast size and the location of these tumors I think the best option is still a mastectomy.  I think this could be combined with a sentinel node biopsy.  I do think she is a candidate for immediate reconstruction.  We discussed all this in detail today.  She  is going to consider plastic surgeon choice and will call me back this week.  I will get her in to see someone soon and we can proceed. She would like to go on a trip to [place] and have her surgery Feb 13/14.  I will talk to her again after she sees plastic surgery.       Alica Shellhammer 03/14/2012, 3:50 PM

## 2012-04-06 NOTE — Transfer of Care (Signed)
Immediate Anesthesia Transfer of Care Note  Patient: Marie Anderson  Procedure(s) Performed: Procedure(s) (LRB) with comments: MASTECTOMY WITH SENTINEL LYMPH NODE BIOPSY (Left) - left mastectomy, left axillary sentinel node biopsy BREAST RECONSTRUCTION WITH PLACEMENT OF TISSUE EXPANDER AND FLEX HD (ACELLULAR HYDRATED DERMIS) (Left) - IMMEDIATE LEFT BREAST RECONSTRUCTION WITH PLACEMENT OF TISSUE EXPANDER AND ALLODERM  Patient Location: PACU  Anesthesia Type:General  Level of Consciousness: awake, alert , oriented and patient cooperative  Airway & Oxygen Therapy: Patient Spontanous Breathing and Patient connected to face mask oxygen  Post-op Assessment: Report given to PACU RN and Post -op Vital signs reviewed and stable  Post vital signs: Reviewed and stable  Complications: No apparent anesthesia complications

## 2012-04-06 NOTE — Op Note (Signed)
Preoperative diagnosis: Multicentric left breast cancer status post primary chemotherapy Postoperative diagnosis: Same as above Procedure: #1 left skin sparing mastectomy #2 injection of blue dye for sentinel node identification #3 left axillary sentinel node biopsy Surgeon: Dr. Harden Mo Anesthesia: Gen. Estimated blood loss: Minimal Specimens: #1 left breast tissue marked with short stitch superior, long stitch lateral #2 left axillary sentinel node times one with counts of 1084 and blue #3 left axillary sentinel node, blue Complications: None Drains: Per plastic surgery Disposition case turned over to plastic surgery for reconstruction Sponge and needle count was correct at end of my portion of the procedure  Indications: This is 67 year old female I saw initially in the breast multidisciplinary clinic. She was noted to have 2 separate areas of breast cancer that were different morphology. One was HER-2/neu amplified. She wanted to try to undergo breast conservation therapy. We elected to begin with primary systemic chemotherapy although was not really sure this is going to be the case given the location of her tumors. She has done well with chemotherapy and her latest MR shows much improvement and one of these tumors is gone. She and I  had a long discussion about over different options I told her I think that it would still be the best plan to proceed with a mastectomy, sentinel node biopsy and she is a good candidate for immediate reconstruction. She has seen plastic surgery and has elected to undergo a expander reconstruction at the same time.  Procedure: After informed consent was obtained the patient was brought to the holding area at day surgery. She was injected with technetium in a standard periareolar fashion. She was then brought to the operating room. She had 1 g of vancomycin given due to her allergies. She had sequential compression devices on her legs. She was then placed  under general anesthesia without complication. Both breasts and her left axilla were then prepped and draped in the standard sterile surgical fashion. A surgical timeout was then performed.  I first injected 1 cc of a saline and methylene blue dye mixture in all 4 of the positions around the nipple areolar complex and massaged this.I made a skin sparing elliptical incision surrounding the nipple areolar complex. I then used cautery to make flaps to the clavicle, sternum, inframammary crease, and latissimus. I then removed the breast tissue from the pectoralis muscle including the pectoralis fascia without difficulty. As I rolled this laterally I identified 2 sentinel nodes as above. There was no background radioactivity and no further blue dye was noted. I then removed the breast from its lateral attachments. I marked it as above. Irrigation was performed. Hemostasis was observed. I then placed a moist sponge and turned the case over to Dr. Kelly Splinter.

## 2012-04-06 NOTE — Anesthesia Procedure Notes (Signed)
Procedure Name: Intubation Date/Time: 04/06/2012 8:43 AM Performed by: Verlan Friends Pre-anesthesia Checklist: Patient identified, Emergency Drugs available, Suction available, Patient being monitored and Timeout performed Patient Re-evaluated:Patient Re-evaluated prior to inductionOxygen Delivery Method: Circle System Utilized Preoxygenation: Pre-oxygenation with 100% oxygen Intubation Type: IV induction Ventilation: Mask ventilation without difficulty Laryngoscope Size: Miller and 3 Grade View: Grade II Tube type: Oral Tube size: 7.0 mm Number of attempts: 1 Airway Equipment and Method: stylet and oral airway Placement Confirmation: ETT inserted through vocal cords under direct vision,  positive ETCO2 and breath sounds checked- equal and bilateral Secured at: 21 cm Tube secured with: Tape Dental Injury: Teeth and Oropharynx as per pre-operative assessment

## 2012-04-06 NOTE — Op Note (Signed)
NAME:  Marie Anderson, Marie Anderson              ACCOUNT NO.:  [id number]  MEDICAL RECORD NO.:  [id number]  LOCATION: [facility]             FACILITY:  [facility]  PHYSICIAN:  Federica Allport Sanger, DO      DATE OF BIRTH:  12/13/1945  DATE OF PROCEDURE:  04/06/2012 DATE OF DISCHARGE:  04/07/2012                              OPERATIVE REPORT   PREOPERATIVE DIAGNOSIS:  Left breast cancer.  POSTOPERATIVE DIAGNOSIS:  Left breast cancer.  PROCEDURE:  Immediate left breast reconstruction with expander and AlloDerm placement.  AlloDerm 4 x 16, expander 350 medium height mentor expander.  SURGEON:  Marie Patalano Sanger, DO.  ASSISTANT:  Mindy Toth, LPN  ANESTHESIA:  General.  INDICATION FOR PROCEDURE:  The patient is a 66-year-old female was diagnosed with left breast cancer.  She made the decision to undergo mastectomy with immediate reconstruction.  Risks and complications were reviewed and documented.  Consent was signed and confirmed.  DESCRIPTION OF PROCEDURE:  The patient was taken to the operating room by General Surgery.  They performed their portion of the case, which included a left mastectomy and sentinel lymph node.  Once that was finished, the patient was rendered to the Plastic and Reconstructive Surgery team.  A time-out was called.  All information was confirmed to be correct.  She was redraped.  The pocket was irrigated with antibiotic solution and warm saline.  Hemostasis was achieved with electrocautery. The pectoralis major muscle was lifted off the chest wall to create a pocket.  The 350 mentor medium height expander was selected and evacuated of air, prepared according to the manufacture guidelines.  The expander was placed underneath the pectoralis major muscle and tacked laterally with a 2-0 PDS.  The AlloDerm 4 x 16 was selected, prepared according to the manufacture guidelines.  Several holes were placed to help with postop drainage.  The AlloDerm was then placed with the  shiny side deep and the porous side superficial and secured to the pectoralis major muscle along the inferior lateral border with 2-0 PDS and then to the inframammary fold and laterally with a 2-0 PDS.  A 200 mL of saline was placed in the expander.  A drain was placed using a stab incision with a #15 blade and secured with 3-0 silk.  Deep layers were closed with 3-0 Vicryl followed by 4-0 Vicryl and a running subcuticular 5-0 Monocryl.  Dermabond was then applied.  ABDs, 4 x 4s, and a Bio-patch was placed with a breast binder. The patient tolerated the procedure well.  Due to the excess skin, the dog ears were removed laterally and medially, 2 cm on each and prior to closure.  The patient was allowed to wait and extubated, taken to recovery in stable condition.     Yamil Oelke Sanger, DO     CS/MEDQ  D:  04/06/2012  T:  04/06/2012  Job:  [id number] 

## 2012-04-06 NOTE — Interval H&P Note (Signed)
History and Physical Interval Note:  04/06/2012 8:14 AM  KRISTIANA JACKO  has presented today for surgery, with the diagnosis of left breast cancer  The various methods of treatment have been discussed with the patient and family. After consideration of risks, benefits and other options for treatment, the patient has consented to  Procedure(s) (LRB) with comments: MASTECTOMY WITH SENTINEL LYMPH NODE BIOPSY (Left) - left mastectomy, left axillary sentinel node biopsy BREAST RECONSTRUCTION WITH PLACEMENT OF TISSUE EXPANDER AND FLEX HD (ACELLULAR HYDRATED DERMIS) (Left) - IMMEDIATE LEFT BREAST RECONSTRUCTION WITH PLACEMENT OF TISSUE EXPANDER AND ALLODERM as a surgical intervention .  The patient's history has been reviewed, patient examined, no change in status, stable for surgery.  I have reviewed the patient's chart and labs.  Questions were answered to the patient's satisfaction.     Risa Auman

## 2012-04-06 NOTE — Anesthesia Postprocedure Evaluation (Signed)
  Anesthesia Post-op Note  Patient: Marie Anderson  Procedure(s) Performed: Procedure(s) (LRB) with comments: MASTECTOMY WITH SENTINEL LYMPH NODE BIOPSY (Left) - left mastectomy, left axillary sentinel node biopsy BREAST RECONSTRUCTION WITH PLACEMENT OF TISSUE EXPANDER AND FLEX HD (ACELLULAR HYDRATED DERMIS) (Left) - IMMEDIATE LEFT BREAST RECONSTRUCTION WITH PLACEMENT OF TISSUE EXPANDER AND ALLODERM  Patient Location: PACU  Anesthesia Type:General  Level of Consciousness: awake  Airway and Oxygen Therapy: Patient Spontanous Breathing  Post-op Pain: mild  Post-op Assessment: Post-op Vital signs reviewed  Post-op Vital Signs: Reviewed  Complications: No apparent anesthesia complications

## 2012-04-06 NOTE — Op Note (Deleted)
NAMEMarland Kitchen  Marie, Anderson NO.:  [id number]  MEDICAL RECORD NO.:  [id number]  LOCATION: [facility]             FACILITY:  [facility]  PHYSICIAN:  Wayland Denis, DO      DATE OF BIRTH:  12-15-1945  DATE OF PROCEDURE:  04/06/2012 DATE OF DISCHARGE:  04/07/2012                              OPERATIVE REPORT   PREOPERATIVE DIAGNOSIS:  Left breast cancer.  POSTOPERATIVE DIAGNOSIS:  Left breast cancer.  PROCEDURE:  Immediate left breast reconstruction with expander and AlloDerm placement.  AlloDerm 4 x 16, expander 350 medium height mentor expander.  SURGEON:  Wayland Denis, DO.  ASSISTANT:  Harrie Foreman, LPN  ANESTHESIA:  General.  INDICATION FOR PROCEDURE:  The patient is a 67 year old female was diagnosed with left breast cancer.  She made the decision to undergo mastectomy with immediate reconstruction.  Risks and complications were reviewed and documented.  Consent was signed and confirmed.  DESCRIPTION OF PROCEDURE:  The patient was taken to the operating room by General Surgery.  They performed their portion of the case, which included a left mastectomy and sentinel lymph node.  Once that was finished, the patient was rendered to the Plastic and Reconstructive Surgery team.  A time-out was called.  All information was confirmed to be correct.  She was redraped.  The pocket was irrigated with antibiotic solution and warm saline.  Hemostasis was achieved with electrocautery. The pectoralis major muscle was lifted off the chest wall to create a pocket.  The 350 mentor medium height expander was selected and evacuated of air, prepared according to the manufacture guidelines.  The expander was placed underneath the pectoralis major muscle and tacked laterally with a 2-0 PDS.  The AlloDerm 4 x 16 was selected, prepared according to the manufacture guidelines.  Several holes were placed to help with postop drainage.  The AlloDerm was then placed with the  shiny side deep and the porous side superficial and secured to the pectoralis major muscle along the inferior lateral border with 2-0 PDS and then to the inframammary fold and laterally with a 2-0 PDS.  A 200 mL of saline was placed in the expander.  A drain was placed using a stab incision with a #15 blade and secured with 3-0 silk.  Deep layers were closed with 3-0 Vicryl followed by 4-0 Vicryl and a running subcuticular 5-0 Monocryl.  Dermabond was then applied.  ABDs, 4 x 4s, and a Bio-patch was placed with a breast binder. The patient tolerated the procedure well.  Due to the excess skin, the dog ears were removed laterally and medially, 2 cm on each and prior to closure.  The patient was allowed to wait and extubated, taken to recovery in stable condition.     Wayland Denis, DO     CS/MEDQ  D:  04/06/2012  T:  04/06/2012  Job:  [id number]

## 2012-04-06 NOTE — Anesthesia Preprocedure Evaluation (Signed)
Anesthesia Evaluation  Patient identified by MRN, date of birth, ID band Patient awake    Reviewed: Allergy & Precautions, H&P , NPO status , Patient's Chart, lab work & pertinent test results  Airway Mallampati: II      Dental   Pulmonary          Cardiovascular + dysrhythmias Supra Ventricular Tachycardia     Neuro/Psych    GI/Hepatic negative GI ROS, Neg liver ROS,   Endo/Other  negative endocrine ROS  Renal/GU negative Renal ROS     Musculoskeletal   Abdominal   Peds  Hematology   Anesthesia Other Findings   Reproductive/Obstetrics                           Anesthesia Physical Anesthesia Plan  ASA: III  Anesthesia Plan: General   Post-op Pain Management:    Induction: Intravenous  Airway Management Planned: Oral ETT  Additional Equipment:   Intra-op Plan:   Post-operative Plan: Extubation in OR  Informed Consent: I have reviewed the patients History and Physical, chart, labs and discussed the procedure including the risks, benefits and alternatives for the proposed anesthesia with the patient or authorized representative who has indicated his/her understanding and acceptance.     Plan Discussed with: CRNA and Anesthesiologist  Anesthesia Plan Comments:         Anesthesia Quick Evaluation

## 2012-04-06 NOTE — Brief Op Note (Signed)
04/06/2012  11:16 AM  PATIENT:  Marie Anderson  67 y.o. female  PRE-OPERATIVE DIAGNOSIS:  left breast cancer  POST-OPERATIVE DIAGNOSIS:  left breast cancer  PROCEDURE:  Procedure(s) (LRB) with comments: MASTECTOMY WITH SENTINEL LYMPH NODE BIOPSY (Left) - left mastectomy, left axillary sentinel node biopsy BREAST RECONSTRUCTION WITH PLACEMENT OF TISSUE EXPANDER AND FLEX HD (ACELLULAR HYDRATED DERMIS) (Left) - IMMEDIATE LEFT BREAST RECONSTRUCTION WITH PLACEMENT OF TISSUE EXPANDER AND ALLODERM  SURGEON:  Surgeon(s) and Role: Panel 1:    * Emelia Loron, MD - Primary  Panel 2:    * Wayland Denis, DO - Primary  PHYSICIAN ASSISTANT: none  ASSISTANTS: Harrie Foreman, LPN   ANESTHESIA:   general  EBL:  Total I/O In: 1100 [I.V.:1100] Out: - nil  BLOOD ADMINISTERED:none  DRAINS: (1) Jackson-Pratt drain(s) with closed bulb suction in the left breast pocket   LOCAL MEDICATIONS USED:  NONE  SPECIMEN:  No Specimen  DISPOSITION OF SPECIMEN:  N/A  COUNTS:  YES  TOURNIQUET:  * No tourniquets in log *  DICTATION: dictated  PLAN OF CARE: overnight stay  PATIENT DISPOSITION:  PACU - hemodynamically stable.   Delay start of Pharmacological VTE agent (>24hrs) due to surgical blood loss or risk of bleeding: no

## 2012-04-07 ENCOUNTER — Other Ambulatory Visit: Payer: BC Managed Care – PPO | Admitting: Lab

## 2012-04-07 ENCOUNTER — Ambulatory Visit: Payer: BC Managed Care – PPO | Admitting: Oncology

## 2012-04-07 ENCOUNTER — Ambulatory Visit: Payer: BC Managed Care – PPO

## 2012-04-07 ENCOUNTER — Encounter (HOSPITAL_BASED_OUTPATIENT_CLINIC_OR_DEPARTMENT_OTHER): Payer: Self-pay | Admitting: General Surgery

## 2012-04-12 ENCOUNTER — Telehealth (INDEPENDENT_AMBULATORY_CARE_PROVIDER_SITE_OTHER): Payer: Self-pay

## 2012-04-12 DIAGNOSIS — Z9012 Acquired absence of left breast and nipple: Secondary | ICD-10-CM | POA: Insufficient documentation

## 2012-04-12 NOTE — Telephone Encounter (Signed)
LMOM giving an appt for the pt next week per Dr Doreen Salvage request. I made the appt for 04/22/12 arrive at 11:15/11:30.

## 2012-04-14 ENCOUNTER — Telehealth: Payer: Self-pay

## 2012-04-14 ENCOUNTER — Other Ambulatory Visit: Payer: Self-pay

## 2012-04-14 NOTE — Telephone Encounter (Signed)
lvm that we are trying to reschedule appts from 2/14 d/t weather. Please call that this is OK. POF sent assuming this is OK.

## 2012-04-15 ENCOUNTER — Ambulatory Visit (HOSPITAL_BASED_OUTPATIENT_CLINIC_OR_DEPARTMENT_OTHER): Payer: BC Managed Care – PPO

## 2012-04-15 ENCOUNTER — Telehealth: Payer: Self-pay | Admitting: Oncology

## 2012-04-15 ENCOUNTER — Encounter: Payer: Self-pay | Admitting: Oncology

## 2012-04-15 ENCOUNTER — Other Ambulatory Visit (HOSPITAL_BASED_OUTPATIENT_CLINIC_OR_DEPARTMENT_OTHER): Payer: BC Managed Care – PPO | Admitting: Lab

## 2012-04-15 ENCOUNTER — Ambulatory Visit: Payer: BC Managed Care – PPO | Admitting: Oncology

## 2012-04-15 VITALS — BP 112/73 | HR 96 | Temp 98.1°F | Wt 185.1 lb

## 2012-04-15 DIAGNOSIS — C50219 Malignant neoplasm of upper-inner quadrant of unspecified female breast: Secondary | ICD-10-CM

## 2012-04-15 DIAGNOSIS — C50919 Malignant neoplasm of unspecified site of unspecified female breast: Secondary | ICD-10-CM

## 2012-04-15 DIAGNOSIS — Z170 Estrogen receptor positive status [ER+]: Secondary | ICD-10-CM

## 2012-04-15 DIAGNOSIS — Z5112 Encounter for antineoplastic immunotherapy: Secondary | ICD-10-CM

## 2012-04-15 LAB — COMPREHENSIVE METABOLIC PANEL (CC13)
ALT: 17 U/L (ref 0–55)
CO2: 25 mEq/L (ref 22–29)
Calcium: 9.2 mg/dL (ref 8.4–10.4)
Chloride: 108 mEq/L — ABNORMAL HIGH (ref 98–107)
Sodium: 140 mEq/L (ref 136–145)
Total Bilirubin: 0.46 mg/dL (ref 0.20–1.20)
Total Protein: 6.2 g/dL — ABNORMAL LOW (ref 6.4–8.3)

## 2012-04-15 LAB — CBC WITH DIFFERENTIAL/PLATELET
Eosinophils Absolute: 0.2 10*3/uL (ref 0.0–0.5)
MONO#: 0.3 10*3/uL (ref 0.1–0.9)
NEUT#: 1.4 10*3/uL — ABNORMAL LOW (ref 1.5–6.5)
RBC: 3.27 10*6/uL — ABNORMAL LOW (ref 3.70–5.45)
RDW: 16.8 % — ABNORMAL HIGH (ref 11.2–14.5)
WBC: 3 10*3/uL — ABNORMAL LOW (ref 3.9–10.3)
lymph#: 1.1 10*3/uL (ref 0.9–3.3)
nRBC: 0 % (ref 0–0)

## 2012-04-15 MED ORDER — SODIUM CHLORIDE 0.9 % IV SOLN
Freq: Once | INTRAVENOUS | Status: AC
  Administered 2012-04-15: 15:00:00 via INTRAVENOUS

## 2012-04-15 MED ORDER — TRASTUZUMAB CHEMO INJECTION 440 MG
6.0000 mg/kg | Freq: Once | INTRAVENOUS | Status: AC
  Administered 2012-04-15: 504 mg via INTRAVENOUS
  Filled 2012-04-15: qty 24

## 2012-04-15 MED ORDER — HEPARIN SOD (PORK) LOCK FLUSH 100 UNIT/ML IV SOLN
500.0000 [IU] | Freq: Once | INTRAVENOUS | Status: AC | PRN
  Administered 2012-04-15: 500 [IU]
  Filled 2012-04-15: qty 5

## 2012-04-15 MED ORDER — SODIUM CHLORIDE 0.9 % IJ SOLN
10.0000 mL | INTRAMUSCULAR | Status: DC | PRN
  Administered 2012-04-15: 10 mL
  Filled 2012-04-15: qty 10

## 2012-04-15 NOTE — Patient Instructions (Signed)
Proceed with herceptin  Every 3 weeks  We will continue to see you every 3 weeks

## 2012-04-15 NOTE — Telephone Encounter (Signed)
gv pt appt schedule for March thru October. Message to KK asking if pt needs appt w/Dr. Michell Heinrich (per pt).

## 2012-04-15 NOTE — Progress Notes (Signed)
1519-Pt refuses to take Tylenol and Benadryl as pre meds prior to Herceptin.

## 2012-04-15 NOTE — Telephone Encounter (Signed)
Per reply from KK pt needs to be seen by Dr. Michell Heinrich ASAP. Radonc closed for the day - lmonvm re pt needing appt ASAP - awaiting return call.

## 2012-04-15 NOTE — Progress Notes (Signed)
OFFICE PROGRESS NOTE  CC  Juluis Mire, MD [address], [address], [address] [place] [address] Dr. Sigmund Hazel Dr. Emelia Loron Dr. Lurline Hare  DIAGNOSIS: 67 year old female with invasive mammary carcinoma of the left breast ER positive PR positive HER-2/neu positive with Ki-67 94%.  PRIOR THERAPY:  #1 patient was originally seen in the multidisciplinary breast clinic on 12/16/2011. She was diagnosed with stage II a HER-2 positive ER/PR positive breast cancer of the left breast at the 3:00 position. She also had a second area that was ER/PR positive but HER-2/neu negative. Patient is interested in breast conservation.  #2Patient is now status post 4 cycles of Herceptin progenitor and Taxotere given every 21 days administered from 12/31/2011 to 03/11/2012.  #3 patient had MRI of the breasts performed on 03/07/2012 that revealed good response to neoadjuvant treatment. She will now be referred to Dr. Emelia Loron for definitive surgery.  #4She underwent a left mastectomy and sentinel lymph node biopsy on 04/06/2012. This showed a 0.8 cm grade 1 invasive ductal carcinoma with associated low-grade ductal carcinoma in situ. One sentinel lymph node had a micrometastatic focus of ductal carcinoma. Treatment effect was noted within the primary lesion however nests of ductal carcinoma involving the lymphatics were still present. The lymph node it shows no evidence of tumor effect. The margins were widely negative. Her tumor was estrogen and progesterone receptor 100% positive. She did have immediate reconstruction with a expander placed at the time of her mastectomy  #5 patient will be referred to radiation oncology for radiation therapy. She will also continue Herceptin every 3 weeks.   CURRENT THERAPY: proceed with radiation therapy and continuation of Herceptin every 3 weeks.  INTERVAL HISTORY: Marie Anderson 67 y.o. female returns for Followup  visit today. She has responded very well her surgery was performed on 04/06/2012 that only showed a 0.8 cm grade 1 face of ductal carcinoma with associated low-grade DCIS one sentinel node had mild micrometastatic focus of ductal carcinoma. Tumor was ER and PR +100% all margins were negative. Postoperatively she is doing well without any complaints. Remainder of the 10 point review of systems is negative. MEDICAL HISTORY: Past Medical History  Diagnosis Date  . Anemia   . SVT (supraventricular tachycardia)   . Breast cancer     ALLERGIES:  is allergic to cephalosporins and codeine.  MEDICATIONS:  Current Outpatient Prescriptions  Medication Sig Dispense Refill  . lidocaine-prilocaine (EMLA) cream Apply topically as needed. Apply to port as directed  30 g  6  . metoprolol succinate (TOPROL-XL) 100 MG 24 hr tablet Take 1 tablet (100 mg total) by mouth daily.  90 tablet  1  . loratadine (CLARITIN) 10 MG tablet Take 10 mg by mouth daily.      . ondansetron (ZOFRAN) 4 MG tablet       . oxyCODONE-acetaminophen (ROXICET) 5-325 MG per tablet Take 1 tablet by mouth every 4 (four) hours as needed for pain.  30 tablet  0   No current facility-administered medications for this visit.    SURGICAL HISTORY:  Past Surgical History  Procedure Laterality Date  . Dilation and curettage of uterus    . Eye surgery      age 89-lt eye growth  . Colonoscopy    . Portacath placement  12/23/2011    Procedure: INSERTION PORT-A-CATH;  Surgeon: Emelia Loron, MD;  Location: [facility];  Service: General;  Laterality: Right;  . Mastectomy  w/ sentinel node biopsy  04/06/2012    Procedure: MASTECTOMY WITH SENTINEL LYMPH NODE BIOPSY;  Surgeon: Emelia Loron, MD;  Location: [facility];  Service: General;  Laterality: Left;  left mastectomy, left axillary sentinel node biopsy  . Breast reconstruction with placement of tissue expander and flex hd (acellular hydrated dermis)  04/06/2012     Procedure: BREAST RECONSTRUCTION WITH PLACEMENT OF TISSUE EXPANDER AND FLEX HD (ACELLULAR HYDRATED DERMIS);  Surgeon: Wayland Denis, DO;  Location: [facility];  Service: Plastics;  Laterality: Left;  IMMEDIATE LEFT BREAST RECONSTRUCTION WITH PLACEMENT OF TISSUE EXPANDER AND ALLODERM    REVIEW OF SYSTEMS:   General: fatigue (-), night sweats (-), fever (-), pain (-) Lymph: palpable nodes (-) HEENT: vision changes (-), mucositis (-), gum bleeding (-), epistaxis (-) Cardiovascular: chest pain (-), palpitations (-) Pulmonary: shortness of breath (-), dyspnea on exertion (-), cough (-), hemoptysis (-) GI:  Early satiety (-), melena (-), dysphagia (-), nausea/vomiting (-), diarrhea (-) GU: dysuria (-), hematuria (-), incontinence (-) Musculoskeletal: joint swelling (-), joint pain (-), back pain (-) Neuro: weakness (-), numbness (+), headache (-), confusion (-) Skin: Rash (+), lesions (-), dryness (-) Psych: depression (-), suicidal/homicidal ideation (-), feeling of hopelessness (-)   PHYSICAL EXAMINATION: Blood pressure 112/73, pulse 96, temperature 98.1 F (36.7 C), temperature source Oral, weight 185 lb 1 oz (83.944 kg). Body mass index is 30.8 kg/(m^2). General: Patient is a well appearing female in no acute distress HEENT: PERRLA, sclerae anicteric no conjunctival pallor, moist mucous membranes Neck: supple, no palpable adenopathy Lungs: clear to auscultation bilaterally, no wheezes, rhonchi, or rales Cardiovascular: regular rate rhythm, S1, S2, no murmurs, rubs or gallops Abdomen: Soft, non-tender, non-distended, normoactive bowel sounds, no HSM Extremities: warm and well perfused, no clubbing, cyanosis, or edema Skin: No lesions.  Maculopapular rash on chest wall and upper back.   Neuro: Non-focal Masses in breast difficult to appreciate.   ECOG PERFORMANCE STATUS: 0 - Asymptomatic  LABORATORY DATA: Lab Results  Component Value Date   WBC 3.0* 04/15/2012   HGB  10.4* 04/15/2012   HCT 33.5* 04/15/2012   MCV 102.4* 04/15/2012   PLT 102* 04/15/2012      Chemistry      Component Value Date/Time   NA 143 03/31/2012 1200   NA 140 03/11/2012 1138   K 3.7 03/31/2012 1200   K 4.2 03/11/2012 1138   CL 107 03/31/2012 1200   CL 104 03/11/2012 1138   CO2 29 03/31/2012 1200   CO2 25 03/11/2012 1138   BUN 7 03/31/2012 1200   BUN 9.0 03/11/2012 1138   CREATININE 0.67 03/31/2012 1200   CREATININE 0.9 03/11/2012 1138      Component Value Date/Time   CALCIUM 8.6 03/31/2012 1200   CALCIUM 9.4 03/11/2012 1138   ALKPHOS 112 03/11/2012 1138   AST 29 03/11/2012 1138   ALT 35 03/11/2012 1138   BILITOT 0.76 03/11/2012 1138     ADDITIONAL INFORMATION: 1. CHROMOGENIC IN-SITU HYBRIDIZATION Interpretation HER-2/NEU BY CISH - NO AMPLIFICATION OF HER-2 DETECTED. THE RATIO OF HER-2: CEP 17 SIGNALS WAS 1.21. Reference range: Ratio: HER2:CEP17 < 1.8 - gene amplification not observed Ratio: HER2:CEP 17 1.8-2.2 - equivocal result Ratio: HER2:CEP17 > 2.2 - gene amplification observed Pecola Leisure MD Pathologist, Electronic Signature ( Signed 04/13/2012) FINAL DIAGNOSIS Diagnosis 1. Breast, simple mastectomy, Left - INVASIVE GRADE I DUCTAL CARCINOMA, SPANNING 0.8 CM IN GREATEST DIMENSION. - ASSOCIATED LOW GRADE DUCTAL CARCINOMA IN SITU PRESENT. - LYMPH/VASCULAR INVASION  IS IDENTIFIED. - MARGINS ARE NEGATIVE. - SEE ONCOLOGY TEMPLATE AND COMMENTS. 2. Lymph node, sentinel, biopsy, Left axillary #1 - ONE LYMPH NODE WITH A MICROMETASTATIC FOCUS OF DUCTAL CARCINOMA (1/1). - SEE COMMENT. 3. Lymph node, sentinel, biopsy, Left axillary #2 - BENIGN FIBROFATTY AND MUSCULAR SOFT TISSUE. - NO TUMOR SEEN. 1 of 4 FINAL for Marie Anderson, Marie Anderson ([id number]) Microscopic Comment 1. BREAST, INVASIVE TUMOR, WITH LYMPH NODE SAMPLING Specimen, including laterality: Left breast with sentinel lymph nodes. Procedure: Left simple mastectomy with sentinel lymph node biopsies. Grade: I. Tubule  formation: 3. Nuclear pleomorphism: 1. Mitotic:1. Tumor size (glass slide measurement): 0.8 cm. Margins: Invasive, distance to closest margin: At least 1.5 cm. In-situ, distance to closest margin: At least 1.5 cm. Lymphovascular invasion: Yes, identified. Ductal carcinoma in situ: Yes. Grade: Low grade. Extensive intraductal component: No. Lobular neoplasia: Not identified. Tumor focality: Only one focus of invasive tumor is identified within the simple mastectomy specimen, see below comment. Treatment effect: Yes, present within breast parenchyma: Residual invasive tumor is not identified in the area associated with the ribbon clip (12:00 location), however, nests of ductal carcinoma involving lymphatics are still present within this area. The findings are consistent with partial response (PR). There is still invasive tumor present associated with the wing clip (3:00 location). The lymph node shows no evidence of treatment effect. Extent of tumor: Tumor confined to breast parenchyma. Lymph nodes: # examined: 1. Lymph nodes with metastasis: 1. Micrometastasis: (> 0.2 mm and < 2.0 mm): 1. Breast prognostic profile for 3 o'clock (wing clip) mass: Estrogen receptor: 100%, positive. Progesterone receptor: 100%, positive. Her-2 neu: 1.38 no amplification. Ki-67: 24%. Breast prognostic profile for 12 o'clock mass associated with ribbon clip: Estrogen receptor: 100%, positive. Progesterone receptor: 70%, positive. Her-2 neu: 5.7 cm, amplified. Ki-67: 94%, high. Non-neoplastic breast: Neoadjuvant changes with calcifications. Biopsy site changes are also identified. TNM: ypT1b, ypN16mi, MX. Comments: Of note, in the area associated with the ribbon clip corresponding to the 12 o'clock mass there is no residual invasive tumor identified. Within this area however, there are nests of ductal carcinoma present within the lymphatic spaces. An E-cadherin immunohistochemical stain is performed on  a representative section of tumor from the 3 o'clock (wing clip) mass. The stain shows positive staining for E-cadherin in both the in situ and invasive carcinoma confirming the ductal nature of the tumor. As the tumor at the 3 o'clock location previously showed no amplification of Her-2 neu by CISH this will be repeated and reported in an addendum. There is no residual invasive tumor at the 12 o'clock position to repeat breast prognostic markers at that particular site. (RAH:gt, 04/07/12) 2. A cytokeratin AE1/AE3 immunohistochemical stain is performed on the first left axillary lymph node biopsy. The stain is positive and shows a 0.1 cm micrometastasis consistent with metastatic ductal carcinoma.  RADIOGRAPHIC STUDIES:   ASSESSMENT: 67 year old female with new diagnosis of  #1 stage II invasive mammary carcinoma of the left breast that is ER positive PR positive HER-2/neu positive with Ki-67 94%. Patient is interested in breast conservation. There for she will begin neoadjuvant chemotherapy consisting of perjeta, Herceptin, and Taxotere. This will be given every 21 days for a total of 4 cycles. After that we will plan on getting MRI of the breasts performed to evaluate response.She has now completed all of her therapy on 03/04/2012.  #2 patient is now status post left mastectomy with sentinel lymph node biopsy on 04/06/2012. She is 0.8 cm grade 1 invasive ductal carcinoma  with associated low-grade ductal carcinoma in situ. One sentinel node micrometastatic focus of ductal carcinoma. Treatment effect was noted within the primary lesion however nests of ductal carcinoma involving the lymphatics were still present. Margins were negative tumor was ER/PR +100%. She is status post immediate reconstruction with expanders placed at the time of her mastectomy.  #3 patient will be referred to radiation oncology for radiation therapy since she does have a micrometastatic in the lymph node. She will also  continue Herceptin every 3 weeks to finish at a year.  PLAN:  #1Refer to radiation oncology.  #2 proceed with Herceptin every 3 weeks adjuvantly.  I spent 25 minutes counseling the patient face to face. The total time spent in the appointment was 30 minutes.  Drue Second, MD Medical/Oncology [facility] [phone] (beeper) [phone] (Office)

## 2012-04-15 NOTE — Patient Instructions (Addendum)
[facility] Discharge Instructions for Patients Receiving Chemotherapy  Today you received the following chemotherapy agents Herceptin.  To help prevent nausea and vomiting after your treatment, we encourage you to take your nausea medication as ordered per MD.    If you develop nausea and vomiting that is not controlled by your nausea medication, call the clinic. If it is after clinic hours your family physician or the after hours number for the clinic or go to the Emergency Department.   BELOW ARE SYMPTOMS THAT SHOULD BE REPORTED IMMEDIATELY:  *FEVER GREATER THAN 100.5 F  *CHILLS WITH OR WITHOUT FEVER  NAUSEA AND VOMITING THAT IS NOT CONTROLLED WITH YOUR NAUSEA MEDICATION  *UNUSUAL SHORTNESS OF BREATH  *UNUSUAL BRUISING OR BLEEDING  TENDERNESS IN MOUTH AND THROAT WITH OR WITHOUT PRESENCE OF ULCERS  *URINARY PROBLEMS  *BOWEL PROBLEMS  UNUSUAL RASH Items with * indicate a potential emergency and should be followed up as soon as possible.  . Please let the nurse know about any problems that you may have experienced. Feel free to call the clinic you have any questions or concerns. The clinic phone number is [phone].   I have been informed and understand all the instructions given to me. I know to contact the clinic, my physician, or go to the Emergency Department if any problems should occur. I do not have any questions at this time, but understand that I may call the clinic during office hours   should I have any questions or need assistance in obtaining follow up care.    __________________________________________  _____________  __________ Signature of Patient or Authorized Representative            Date                   Time    __________________________________________ Nurse's Signature    

## 2012-04-17 ENCOUNTER — Other Ambulatory Visit: Payer: Self-pay

## 2012-04-18 ENCOUNTER — Telehealth: Payer: Self-pay | Admitting: Oncology

## 2012-04-18 DIAGNOSIS — C50919 Malignant neoplasm of unspecified site of unspecified female breast: Secondary | ICD-10-CM | POA: Insufficient documentation

## 2012-04-18 DIAGNOSIS — D649 Anemia, unspecified: Secondary | ICD-10-CM | POA: Insufficient documentation

## 2012-04-18 NOTE — Telephone Encounter (Signed)
S/w pt re appt for 2/19 @ 12:30pm w/Dr. Michell Heinrich. appt scheduled w/Karen.

## 2012-04-19 ENCOUNTER — Encounter: Payer: Self-pay | Admitting: Radiation Oncology

## 2012-04-19 DIAGNOSIS — Z9221 Personal history of antineoplastic chemotherapy: Secondary | ICD-10-CM | POA: Insufficient documentation

## 2012-04-20 ENCOUNTER — Encounter: Payer: Self-pay | Admitting: Radiation Oncology

## 2012-04-20 ENCOUNTER — Ambulatory Visit
Admission: RE | Admit: 2012-04-20 | Discharge: 2012-04-20 | Disposition: A | Discharge status: Home / Self Care | Payer: BC Managed Care – PPO | Source: Ambulatory Visit | Attending: Radiation Oncology | Admitting: Radiation Oncology

## 2012-04-20 VITALS — BP 110/66 | HR 86 | Temp 98.3°F | Resp 20 | Wt 184.9 lb

## 2012-04-20 DIAGNOSIS — C50219 Malignant neoplasm of upper-inner quadrant of unspecified female breast: Secondary | ICD-10-CM

## 2012-04-20 DIAGNOSIS — C50919 Malignant neoplasm of unspecified site of unspecified female breast: Secondary | ICD-10-CM | POA: Insufficient documentation

## 2012-04-20 NOTE — Progress Notes (Signed)
Please see the Nurse Progress Note in the MD Initial Consult Encounter for this patient. 

## 2012-04-20 NOTE — Progress Notes (Signed)
Pt denies pain, fatigue, loss of appetite. She completed chemo 03/03/12 and states she has numbness, tingling of fingers, toes, feet, has nail dyscrasia. She occasionally has swelling of feet.  Pt here w/her significant other today.

## 2012-04-21 NOTE — Progress Notes (Signed)
Department of Radiation Oncology  Phone:  [phone] Fax:        [phone]   Name: Marie Anderson MRN: [id number]  DOB: 1945/04/20  Date: 04/20/2012   Follow Up Visit Note  Diagnosis: T2N1 Invasive ductal carcinoma of the left breast  Interval History: Marie Anderson presents today for routine followup.  He underwent neoadjuvant chemotherapy. She had a good response by MRI but unfortunately was not felt to be a candidate for breast conservation. She underwent a left mastectomy and sentinel lymph node biopsy on 04/06/2012. This showed a 0.8 cm grade 1 invasive ductal carcinoma with associated low-grade ductal carcinoma in situ. One sentinel lymph node had a micrometastatic focus of ductal carcinoma. Treatment effect was noted within the primary lesion however nests of ductal carcinoma involving the lymphatics were still present. The lymph node it shows no evidence of tumor effect. The margins were widely negative. Her tumor was estrogen and progesterone receptor 100% positive. She did have immediate reconstruction with a expander placed at the time of her mastectomy. She was lymph node negative clinically and radiographically upon presentation. She is recovered well from her surgery. She is regaining her energy and appetite. He has occasional swelling of her feet and neuropathy. She is continuing Herceptin.  Allergies:  Allergies  Allergen Reactions  . Cephalosporins     headache  . Codeine Nausea Only    Medications:  Current Outpatient Prescriptions  Medication Sig Dispense Refill  . diazepam (VALIUM) 2 MG tablet       . lidocaine-prilocaine (EMLA) cream Apply topically as needed. Apply to port as directed  30 g  6  . metoprolol succinate (TOPROL-XL) 100 MG 24 hr tablet Take 1 tablet (100 mg total) by mouth daily.  90 tablet  1  . ondansetron (ZOFRAN) 4 MG tablet       . oxyCODONE-acetaminophen (ROXICET) 5-325 MG per tablet Take 1 tablet by mouth every 4 (four) hours as needed for  pain.  30 tablet  0  . HYDROcodone-acetaminophen (NORCO/VICODIN) 5-325 MG per tablet        No current facility-administered medications for this encounter.    Physical Exam:  Filed Vitals:   04/20/12 1246  BP: 110/66  Pulse: 86  Temp: 98.3 F (36.8 C)  Resp: 20   is a pleasant female in no distress sitting comfortably examining table. She has a mastectomy incision which is healing well over her left chest wall and expander in place.  IMPRESSION: Marie Anderson is a 67 y.o. female status post neoadjuvant chemotherapy and mastectomy for a T2 N1 invasive ductal carcinoma of the left breast  PLAN:  I talked to the patient and her friend today regarding her diagnosis. We discussed that she unfortunately was found have a cold metastatic disease in a single lymph node. The unknowns are whether there are other lymph nodes that are involved with tumor. At this point we did do an axillary lymph node dissection but really is unclear of what her nodal status was up front. I think she probably has about a 10-15% risk of recurrence on the chest wall. This could be higher than one positive lymph node she had. It concerns me somewhat that she had residual carcinoma within the breast especially residual carcinoma within the lymphatics. I have offered her adjuvant radiation to the chest wall and regional lymphatics. I think it's prudent to treat her axilla as it has not been fully addressed. We will end up treating her chest wall, supraclavicular fossa and  axilla. I discussed the increase in lymphedema risk with lymph node dissection and radiation.. She understands that this treatment is at a cost of her cosmetic outcome with her implant in place. We discussed that usually we treat with the expander in place and that she continues to be expanded after treatments over. We discussed the decreasing cosmetic outcome as well as the fibrosis that can occur. We discussed the process of simulation the placement tattoos. We  discussed the use of breath hold for cardiac sparing. We discussed skin redness fatigue and possible heart and lung damage. She has signed informed consent and agree to proceed forward. I scheduled her for simulation next week.    Lurline Hare, MD

## 2012-04-22 ENCOUNTER — Ambulatory Visit (INDEPENDENT_AMBULATORY_CARE_PROVIDER_SITE_OTHER): Payer: BC Managed Care – PPO | Admitting: General Surgery

## 2012-04-22 ENCOUNTER — Encounter (INDEPENDENT_AMBULATORY_CARE_PROVIDER_SITE_OTHER): Payer: Self-pay | Admitting: General Surgery

## 2012-04-22 VITALS — BP 120/68 | HR 74 | Temp 97.8°F | Resp 16 | Ht 65.0 in | Wt 186.4 lb

## 2012-04-26 ENCOUNTER — Ambulatory Visit
Admission: RE | Admit: 2012-04-26 | Discharge: 2012-04-26 | Disposition: A | Discharge status: Home / Self Care | Payer: BC Managed Care – PPO | Source: Ambulatory Visit | Attending: Radiation Oncology | Admitting: Radiation Oncology

## 2012-04-26 DIAGNOSIS — L539 Erythematous condition, unspecified: Secondary | ICD-10-CM | POA: Insufficient documentation

## 2012-04-26 DIAGNOSIS — Y842 Radiological procedure and radiotherapy as the cause of abnormal reaction of the patient, or of later complication, without mention of misadventure at the time of the procedure: Secondary | ICD-10-CM | POA: Insufficient documentation

## 2012-04-26 DIAGNOSIS — L589 Radiodermatitis, unspecified: Secondary | ICD-10-CM | POA: Insufficient documentation

## 2012-04-26 DIAGNOSIS — C50219 Malignant neoplasm of upper-inner quadrant of unspecified female breast: Secondary | ICD-10-CM | POA: Insufficient documentation

## 2012-04-26 DIAGNOSIS — Z510 Encounter for antineoplastic radiation therapy: Secondary | ICD-10-CM | POA: Insufficient documentation

## 2012-04-26 DIAGNOSIS — L819 Disorder of pigmentation, unspecified: Secondary | ICD-10-CM | POA: Insufficient documentation

## 2012-04-26 DIAGNOSIS — L299 Pruritus, unspecified: Secondary | ICD-10-CM | POA: Insufficient documentation

## 2012-04-26 NOTE — Progress Notes (Signed)
Name: Marie Anderson   MRN: [id number]  Date:  04/26/2012  DOB: 1945-12-12  Status:outpatient   DIAGNOSIS: Left Breast cancer.  CONSENT VERIFIED: yes SET UP: Patient is setup supine  IMMOBILIZATION:  The following immobilization was used:Custom Moldable Pillow, breast board.  NARRATIVE: [name] was brought to the CT Simulation planning suite.  Identity was confirmed.  All relevant records and images related to the planned course of therapy were reviewed.  Then, the patient was positioned in a stable reproducible clinical set-up for radiation therapy.  She was uncomfortable but we adjusted her arms for maximum comfort. Wires were placed to delineate the clinical extent of breast tissue. A wire was placed on the scar as well.  Her contralateral breast fell out of the way of tangent fields across her expander. CT images were obtained.  An isocenter was placed. Skin markings were placed.  The position of the heart was then analyzed.  Due to the proximity of the heart to the chest wall, I felt she would benefit from deep inspiration breath hold for cardiac sparing.  She was then coached and rescanned in the breath hold position.  Acceptable cardiac sparing was achieved. The CT images were loaded into the planning software where the target and avoidance structures were contoured.  The radiation prescription was entered and confirmed. The patient was discharged in stable condition and tolerated simulation well.    TREATMENT PLANNING NOTE/3D Simulation Note Treatment planning then occurred. I have requested : MLC's, isodose plan, basic dose calculation  3D simulation was performed.  I personally supervised and oversaw the construction of 5 medically necessary complex treatment devices in the form of MLCs which will be used for beam modification purposes and to protect critical structures including the heart and lung as well as the immobilization devices which will be used to ensure reproducible set up.   I have requested a dose volume histogram of the heart lung and tumor cavity.    RESPIRATORY MOTION MANAGEMENT SIMULATION - Deep Inspiration Breath Hold  NARRATIVE:  In order to account for effect of respiratory motion on target structures and other organs in the planning and delivery of radiotherapy, this patient underwent respiratory motion management simulation.  To accomplish this, when the patient was brought to the CT simulation planning suite, a bellows was placed on the her abdomen.  Wave forms of the patient's breathing were obtained. Coaching was performed and practice sessions initiated to monitor her ability to obtain and maintain deep inspiration breath hold.  The CT images were loaded into the planning software and fused with her free breathing images by physics.  Acceptable cardiac sparing was achieved through the use of deep inspiration breath hold.  Planning will be performed on her breath hold scan  I also referred her to the after breast cancer class to aid in arm positioning.

## 2012-04-26 NOTE — Progress Notes (Signed)
Subjective:     Patient ID: Marie Anderson, female   DOB: [birth year], 67 y.o.   MRN: [id number]  HPI This is a 66 year old female underwent primary systemic chemotherapy followed by a left mastectomy left axillary sentinel lymph node biopsy. She did undergo immediate expander reconstruction. On her final pathology this shows an invasive grade 1 ductal carcinoma measuring 8 mm with associated low-grade ductal carcinoma in situ and lymphovascular invasion. Margins were all negative. She had a lymph node with a micrometastatic focus of ductal carcinoma. She has been discussed in a multidisciplinary fashion and will see radiation and medical oncology for discussion of any further adjuvant therapy. She comes in today and does not really have any complaints referable to her surgery at all.  Review of Systems     Objective:   Physical Exam Healing mastectomy incision without infection, expander in place    Assessment:     S/p left sm/sn     Plan:     She's doing well postoperatively. I do agree with radiation oncology and medical oncology that she should get radiation therapy. This is been discussed with Dr. Kelly Splinter her Engineer, petroleum as well. Following that she should get antiestrogen therapy.

## 2012-04-27 ENCOUNTER — Ambulatory Visit
Admission: RE | Admit: 2012-04-27 | Discharge: 2012-04-27 | Disposition: A | Discharge status: Home / Self Care | Payer: BC Managed Care – PPO | Source: Ambulatory Visit | Attending: Radiation Oncology | Admitting: Radiation Oncology

## 2012-04-27 NOTE — Progress Notes (Signed)
Simulation Note:  Marie Anderson had her custom bolus made today. She was brought to the simulation room and placed in the treatment position.  A custom bolus was fashioned around her chest wall and expander and allowed to dry.  She tolerated simulation well.

## 2012-05-03 ENCOUNTER — Ambulatory Visit
Admission: RE | Admit: 2012-05-03 | Discharge: 2012-05-03 | Disposition: A | Discharge status: Home / Self Care | Payer: BC Managed Care – PPO | Source: Ambulatory Visit | Attending: Radiation Oncology | Admitting: Radiation Oncology

## 2012-05-03 NOTE — Progress Notes (Signed)
  Radiation Oncology         [phone]) [phone] ________________________________  Name: Marie Anderson MRN: [id number]  Date: 05/03/2012  DOB: [birth year]  Simulation Verification Note  Status: outpatient  NARRATIVE: The patient was brought to the treatment unit and placed in the planned treatment position. The clinical setup was verified. Then port films were obtained and uploaded to the radiation oncology medical record software.  The treatment beams were carefully compared against the planned radiation fields. The position location and shape of the radiation fields was reviewed. The targeted volume of tissue appears appropriately covered by the radiation beams. Organs at risk appear to be excluded as planned.  Based on my personal review, I approved the simulation verification. The patient's treatment will proceed as planned.  ------------------------------------------------  Lurline Hare, MD

## 2012-05-04 ENCOUNTER — Ambulatory Visit
Admission: RE | Admit: 2012-05-04 | Discharge: 2012-05-04 | Disposition: A | Discharge status: Home / Self Care | Payer: BC Managed Care – PPO | Source: Ambulatory Visit | Attending: Radiation Oncology | Admitting: Radiation Oncology

## 2012-05-04 ENCOUNTER — Encounter: Payer: Self-pay | Admitting: Cardiology

## 2012-05-04 ENCOUNTER — Ambulatory Visit (INDEPENDENT_AMBULATORY_CARE_PROVIDER_SITE_OTHER): Payer: BC Managed Care – PPO | Admitting: Cardiology

## 2012-05-04 VITALS — BP 132/82 | HR 79 | Ht 65.0 in | Wt 183.0 lb

## 2012-05-04 DIAGNOSIS — C50919 Malignant neoplasm of unspecified site of unspecified female breast: Secondary | ICD-10-CM

## 2012-05-04 DIAGNOSIS — C50212 Malignant neoplasm of upper-inner quadrant of left female breast: Secondary | ICD-10-CM

## 2012-05-04 DIAGNOSIS — I471 Supraventricular tachycardia: Secondary | ICD-10-CM

## 2012-05-04 DIAGNOSIS — Z9221 Personal history of antineoplastic chemotherapy: Secondary | ICD-10-CM

## 2012-05-04 MED ORDER — RADIAPLEXRX EX GEL
Freq: Once | CUTANEOUS | Status: AC
  Administered 2012-05-04: 13:00:00 via TOPICAL

## 2012-05-04 MED ORDER — ALRA NON-METALLIC DEODORANT (RAD-ONC)
1.0000 "application " | Freq: Once | TOPICAL | Status: AC
  Administered 2012-05-04: 1 via TOPICAL

## 2012-05-04 MED ORDER — METOPROLOL SUCCINATE ER 100 MG PO TB24: 100.0000 mg | ORAL_TABLET | Freq: Every day | ORAL | Status: DC

## 2012-05-04 NOTE — Patient Instructions (Signed)
Continue your current therapy.  I will see you in one year unless you need [place] sooner.

## 2012-05-04 NOTE — Progress Notes (Signed)
Marie Anderson Date of Birth: 1945-05-26 Medical Record #[id number]  History of Present Illness: Marie Anderson is seen for yearly followup. She has a history of supraventricular tachycardia that has been well controlled with metoprolol. Since her last visit here she has been diagnosed with breast cancer. She had a left mastectomy with reconstruction. She is a chemotherapy with Taxotere and now Herceptin. She is getting ready to start radiation therapy. In November she had one episode of tachycardia after she had received chemotherapy and was dehydrated. She went to the emergency room where ECG showed a supraventricular tachycardia with a rate of 224 beats per minute. She was converted with IV adenosine. She has had no recurrent palpitations since that time.   Current Outpatient Prescriptions on File Prior to Visit  Medication Sig Dispense Refill  . diazepam (VALIUM) 2 MG tablet       . HYDROcodone-acetaminophen (NORCO/VICODIN) 5-325 MG per tablet       . lidocaine-prilocaine (EMLA) cream Apply topically as needed. Apply to port as directed  30 g  6  . metoprolol succinate (TOPROL-XL) 100 MG 24 hr tablet Take 1 tablet (100 mg total) by mouth daily.  90 tablet  1  . ondansetron (ZOFRAN) 4 MG tablet       . oxyCODONE-acetaminophen (ROXICET) 5-325 MG per tablet Take 1 tablet by mouth every 4 (four) hours as needed for pain.  30 tablet  0   No current facility-administered medications on file prior to visit.    Allergies  Allergen Reactions  . Cephalosporins     headache  . Codeine Nausea Only    Past Medical History  Diagnosis Date  . Anemia   . SVT (supraventricular tachycardia)   . Breast cancer 12/04/11    left- inv ductal ca, DCIS, ER/PR +, HER 2 +  . History of chemotherapy 12/31/11 -03/11/12    s/p 4 cycles    Past Surgical History  Procedure Laterality Date  . Dilation and curettage of uterus    . Eye surgery      age 67-lt eye growth  . Colonoscopy    . Portacath placement   12/23/2011    Procedure: INSERTION PORT-A-CATH;  Surgeon: Emelia Loron, MD;  Location: [facility];  Service: General;  Laterality: Right;  . Mastectomy w/ sentinel node biopsy  04/06/2012    Procedure: MASTECTOMY WITH SENTINEL LYMPH NODE BIOPSY;  Surgeon: Emelia Loron, MD;  Location: [facility];  Service: General;  Laterality: Left;  left mastectomy, left axillary sentinel node biopsy  . Breast reconstruction with placement of tissue expander and flex hd (acellular hydrated dermis)  04/06/2012    Procedure: BREAST RECONSTRUCTION WITH PLACEMENT OF TISSUE EXPANDER AND FLEX HD (ACELLULAR HYDRATED DERMIS);  Surgeon: Wayland Denis, DO;  Location: [facility];  Service: Plastics;  Laterality: Left;  IMMEDIATE LEFT BREAST RECONSTRUCTION WITH PLACEMENT OF TISSUE EXPANDER AND ALLODERM    History  Smoking status  . Never Smoker   Smokeless tobacco  . Never Used    History  Alcohol Use  . Yes    History reviewed. No pertinent family history.  Review of Systems: As noted in history of present illness.  All other systems were reviewed and are negative.  Physical Exam: BP 132/82  Pulse 79  Ht 5\' 5"  (1.651 m)  Wt 183 lb (83.008 kg)  BMI 30.45 kg/m2  SpO2 98% The patient is alert and oriented x 3.  She has chemotherapy induced alopecia. The HEENT  exam reveals that the sclera are nonicteric.  The mucous membranes are moist.  The carotids are 2+ without bruits.  There is no thyromegaly.  There is no JVD.  The lungs are clear.  She has had left breast reconstruction. The heart exam reveals a regular rate with a normal S1 and S2.  There are no murmurs, gallops, or rubs.  The PMI is not displaced.   Abdominal exam reveals good bowel sounds.  There is no hepatosplenomegaly or tenderness.  There are no masses.  Exam of the legs reveal no clubbing, cyanosis, or edema.  The legs are without rashes.  The distal pulses are intact.  Cranial nerves II - XII are  intact.  Motor and sensory functions are intact.  The gait is normal.  LABORATORY DATA: ECG from October demonstrates normal sinus rhythm with nonspecific ST-T wave abnormality.  Transthoracic Echocardiography  Patient: Marie Anderson MR #: [id number] Study Date: 12/25/2011 Gender: F Age: 88 Height: 165.1cm Weight: 93.4kg BSA: 36m^2 Pt. Status: Room:  Mosetta Anderson, Marie REFERRING Welton Flakes, [place] PERFORMING [facility], [facility] SONOGRAPHER Junious Dresser, RDCS ATTENDING Bensimhon, Daniel cc:  ------------------------------------------------------------ LV EF: 55% - 60%  ------------------------------------------------------------ Indications: 425.4 Other primary Cardiomyopathies. Neoplasm - breast 174.9.  ------------------------------------------------------------ History: PMH: Acquired from the patient and from the patient's chart. Supraventricular tachycardia. The patient has a breast malignancy. Risk factors: Family history of coronary artery disease. Obese.  ------------------------------------------------------------ Study Conclusions  - Left ventricle: The cavity size was normal. Systolic function was normal. The estimated ejection fraction was in the range of 55% to 60%. Wall motion was normal; there were no regional wall motion abnormalities. - Atrial septum: No defect or patent foramen ovale was identified. Transthoracic echocardiography. M-mode, complete 2D, spectral Doppler, and color Doppler. Height: Height: 165.1cm. Height: 65in. Weight: Weight: 93.4kg. Weight: 205.6lb. Body mass index: BMI: 34.3kg/m^2. Body surface area: BSA: 52m^2. Blood pressure: 143/83. Patient status: Outpatient. Location: [facility]  ------------------------------------------------------------  ------------------------------------------------------------ Left ventricle: The cavity size was normal. Systolic function was normal. The estimated ejection fraction was  in the range of 55% to 60%. Wall motion was normal; there were no regional wall motion abnormalities.  ------------------------------------------------------------ Aortic valve: Trileaflet; normal thickness, mildly calcified leaflets. Mobility was not restricted. Doppler: Transvalvular velocity was within the normal range. There was no stenosis. No regurgitation.  ------------------------------------------------------------ Aorta: Aortic root: The aortic root was normal in size.  ------------------------------------------------------------ Mitral valve: Structurally normal valve. Mobility was not restricted. Doppler: Transvalvular velocity was within the normal range. There was no evidence for stenosis. No regurgitation. Peak gradient: 2mm Hg (D).  ------------------------------------------------------------ Left atrium: The atrium was normal in size.  ------------------------------------------------------------ Atrial septum: No defect or patent foramen ovale was identified.  ------------------------------------------------------------ Right ventricle: The cavity size was normal. Wall thickness was normal. Systolic function was normal.  ------------------------------------------------------------ Pulmonic valve: Doppler: Transvalvular velocity was within the normal range. There was no evidence for stenosis.  ------------------------------------------------------------ Tricuspid valve: Structurally normal valve. Doppler: Transvalvular velocity was within the normal range. Mild regurgitation.  ------------------------------------------------------------ Pulmonary artery: The main pulmonary artery was normal-sized. Systolic pressure was within the normal range.  ------------------------------------------------------------ Right atrium: The atrium was normal in size.  ------------------------------------------------------------ Pericardium: The pericardium was normal in  appearance. There was no pericardial effusion.  ------------------------------------------------------------ Systemic veins: Inferior vena cava: The vessel was normal in size.  ------------------------------------------------------------  2D measurements Normal Doppler measurements Normal Left ventricle Left ventricle LVID ED, 40.2 mm 43-52 Ea, lat 9.98 cm/s ------ chord, ann,  tiss PLAX DP LVID ES, 26.2 mm 23-38 E/Ea, lat 7.82 ------ chord, ann, tiss PLAX DP FS, chord, 35 % >29 Sa, lat 10.6 cm/s ------ PLAX ann, tiss LVPW, ED 9.8 mm ------ DP IVS/LVPW 1.07 <1.3 Ea, med 4.61 cm/s ------ ratio, ED ann, tiss Ventricular septum DP IVS, ED 10.5 mm ------ E/Ea, med 16.92 ------ LVOT ann, tiss Diam, S 21 mm ------ DP Area 3.46 cm^2 ------ Sa, med 7.79 cm/s ------ Diam 21 mm ------ ann, tiss Aorta DP Root diam, 33 mm ------ LVOT ED Peak vel, 85 cm/s ------ Left atrium S AP dim 36 mm ------ VTI, S 17.9 cm ------ AP dim 1.8 cm/m^2 <2.2 Stroke vol 62 ml ------ index Stroke 31 ml/m^ ------ index 2 Mitral valve Peak E vel 78 cm/s ------ Peak A vel 63.7 cm/s ------ Decelerati 155 ms 150-23 on time 0 Peak 2 mm Hg ------ gradient, D Peak E/A 1.2 ------ ratio Right ventricle Sa vel, 14.8 cm/s ------ lat ann, tiss DP  ------------------------------------------------------------ Prepared and Electronically Authenticated by  Charlton Haws 2013-10-25T15:[id number]  Assessment / Plan: 1. Supraventricular tachycardia. Well controlled on metoprolol. If she were to have frequent breakthrough we could consider ablative therapy. We will continue on her current medication for now.  2. Breast cancer status post mastectomy. On chemotherapy with Herceptin. Echocardiogram shows normal LV function. This will be monitored per protocol.

## 2012-05-04 NOTE — Progress Notes (Signed)
Post sim ed completed w/pt and husband. Gave pt "Radiation and You" booklet w/all pertinent information marked and discussed, re: fatigue, skin irritation/care, nutrition, pain. Gave pt Radiaplex, Alra w/instructions for proper use. All questions answered. Pt verbalized understanding.

## 2012-05-05 ENCOUNTER — Ambulatory Visit
Admission: RE | Admit: 2012-05-05 | Discharge: 2012-05-05 | Disposition: A | Discharge status: Home / Self Care | Payer: BC Managed Care – PPO | Source: Ambulatory Visit | Attending: Radiation Oncology | Admitting: Radiation Oncology

## 2012-05-05 ENCOUNTER — Other Ambulatory Visit: Payer: Self-pay | Admitting: Medical Oncology

## 2012-05-06 ENCOUNTER — Encounter: Payer: Self-pay | Admitting: Family

## 2012-05-06 ENCOUNTER — Ambulatory Visit (HOSPITAL_BASED_OUTPATIENT_CLINIC_OR_DEPARTMENT_OTHER): Payer: BC Managed Care – PPO

## 2012-05-06 ENCOUNTER — Other Ambulatory Visit (HOSPITAL_BASED_OUTPATIENT_CLINIC_OR_DEPARTMENT_OTHER): Payer: BC Managed Care – PPO | Admitting: Lab

## 2012-05-06 ENCOUNTER — Telehealth: Payer: Self-pay | Admitting: Oncology

## 2012-05-06 ENCOUNTER — Ambulatory Visit (HOSPITAL_BASED_OUTPATIENT_CLINIC_OR_DEPARTMENT_OTHER): Payer: BC Managed Care – PPO | Admitting: Family

## 2012-05-06 ENCOUNTER — Ambulatory Visit
Admission: RE | Admit: 2012-05-06 | Discharge: 2012-05-06 | Disposition: A | Discharge status: Home / Self Care | Payer: BC Managed Care – PPO | Source: Ambulatory Visit | Attending: Radiation Oncology | Admitting: Radiation Oncology

## 2012-05-06 VITALS — BP 128/81 | HR 69 | Temp 98.1°F | Wt 184.0 lb

## 2012-05-06 DIAGNOSIS — C50219 Malignant neoplasm of upper-inner quadrant of unspecified female breast: Secondary | ICD-10-CM

## 2012-05-06 DIAGNOSIS — Z170 Estrogen receptor positive status [ER+]: Secondary | ICD-10-CM

## 2012-05-06 DIAGNOSIS — C50919 Malignant neoplasm of unspecified site of unspecified female breast: Secondary | ICD-10-CM

## 2012-05-06 DIAGNOSIS — I972 Postmastectomy lymphedema syndrome: Secondary | ICD-10-CM

## 2012-05-06 DIAGNOSIS — Z9010 Acquired absence of unspecified breast and nipple: Secondary | ICD-10-CM

## 2012-05-06 LAB — COMPREHENSIVE METABOLIC PANEL (CC13)
ALT: 18 U/L (ref 0–55)
AST: 21 U/L (ref 5–34)
Alkaline Phosphatase: 65 U/L (ref 40–150)
Calcium: 8.6 mg/dL (ref 8.4–10.4)
Chloride: 111 mEq/L — ABNORMAL HIGH (ref 98–107)
Creatinine: 0.7 mg/dL (ref 0.6–1.1)
Total Bilirubin: 0.53 mg/dL (ref 0.20–1.20)

## 2012-05-06 LAB — CBC WITH DIFFERENTIAL/PLATELET
BASO%: 1 % (ref 0.0–2.0)
EOS%: 8.9 % — ABNORMAL HIGH (ref 0.0–7.0)
LYMPH%: 47.1 % (ref 14.0–49.7)
MCHC: 31.6 g/dL (ref 31.5–36.0)
MONO#: 0.2 10*3/uL (ref 0.1–0.9)
Platelets: 82 10*3/uL — ABNORMAL LOW (ref 145–400)
RBC: 3.59 10*6/uL — ABNORMAL LOW (ref 3.70–5.45)
WBC: 1.9 10*3/uL — ABNORMAL LOW (ref 3.9–10.3)
nRBC: 0 % (ref 0–0)

## 2012-05-06 MED ORDER — TRASTUZUMAB CHEMO INJECTION 440 MG
6.0000 mg/kg | Freq: Once | INTRAVENOUS | Status: AC
  Administered 2012-05-06: 504 mg via INTRAVENOUS
  Filled 2012-05-06: qty 24

## 2012-05-06 NOTE — Progress Notes (Signed)
Pt d

## 2012-05-06 NOTE — Progress Notes (Signed)
[facility]  Telephone:(336) [phone] Fax:(336) [phone]  OFFICE PROGRESS NOTE  PATIENT: Marie Anderson   DOB: 1945/07/16  MR#: [id number]  EXB#:[id number]  NU:UVOZDG,UYQI S, MD Dr. Sigmund Hazel  Dr. Emelia Loron  Dr. Lurline Hare  DIAGNOSIS:  67 year old [place], [name] with invasive mammary carcinoma of the left breast ER positive PR positive HER-2/neu positive with Ki-67 94% diagnosed in 12/2011.   PRIOR THERAPY: 1.  The patient was originally seen in the Multidisciplinary Breast Clinic on 12/16/2011. She was diagnosed with stage II a HER-2 positive ER/PR positive breast cancer of the left breast at the 3:00 position. She also had a second area that was ER/PR positive but HER-2/neu negative. Patient is interested in breast conservation.   2.  The patient is now status post 4 cycles of Herceptin progenitor and Taxotere given every 21 days administered from 12/31/2011 to 03/11/2012.   3.  The patient had MRI of the breasts performed on 03/07/2012 that revealed good response to neoadjuvant treatment. She was referred to Dr. Emelia Loron for definitive surgery.   4.  The patient underwent a left mastectomy and sentinel lymph node biopsy on 04/06/2012. This showed a 0.8 cm grade 1 invasive ductal carcinoma with associated low-grade ductal carcinoma in situ. One sentinel lymph node had a micrometastatic focus of ductal carcinoma. Treatment effect was noted within the primary lesion however nests of ductal carcinoma involving the lymphatics were still present. The lymph node shows no evidence of tumor effect. The margins were widely negative. Her tumor was estrogen and progesterone receptor 100% positive. She did have immediate reconstruction with a expander placed at the time of her mastectomy.  5. The patient was referred to radiation oncology for radiation therapy. She will also continue Herceptin every 3 weeks.   CURRENT THERAPY:  Radiation  therapy currently underway with the patient scheduled for 35 treatments.  Herceptin infusions are being administered every 3 weeks.   INTERVAL HISTORY: Dr. Welton Flakes and I saw [name] for followup of invasive mammary carcinoma of the left breast. She is accompanied by her [name] for today's office visit. She was last seen in our office on 04/15/2012. Since her last office visit, the patient and Kathlene November reports that she is doing relatively well. She has had an increase in appetite and is tolerating radiation therapy. Her only complaint is left arm discomfort when completing radiation therapy as her arm must be a raised during radiation therapy. The patient denies any other symptomatology. The patient completed her third radiation treatment today as scheduled for cycle 2 of every 3 week Herceptin infusion today.   PAST MEDICAL HISTORY: Past Medical History  Diagnosis Date  . Anemia   . SVT (supraventricular tachycardia)   . Breast cancer 12/04/11    left- inv ductal ca, DCIS, ER/PR +, HER 2 +  . History of chemotherapy 12/31/11 -03/11/12    s/p 4 cycles    PAST SURGICAL HISTORY: Past Surgical History  Procedure Laterality Date  . Dilation and curettage of uterus    . Eye surgery      age 70-lt eye growth  . Colonoscopy    . Portacath placement  12/23/2011    Procedure: INSERTION PORT-A-CATH;  Surgeon: Emelia Loron, MD;  Location: [facility];  Service: General;  Laterality: Right;  . Mastectomy w/ sentinel node biopsy  04/06/2012    Procedure: MASTECTOMY WITH SENTINEL LYMPH NODE BIOPSY;  Surgeon: Emelia Loron, MD;  Location: [facility];  Service: General;  Laterality: Left;  left mastectomy, left axillary sentinel node biopsy  . Breast reconstruction with placement of tissue expander and flex hd (acellular hydrated dermis)  04/06/2012    Procedure: BREAST RECONSTRUCTION WITH PLACEMENT OF TISSUE EXPANDER AND FLEX HD (ACELLULAR HYDRATED DERMIS);  Surgeon:  Wayland Denis, DO;  Location: [facility];  Service: Plastics;  Laterality: Left;  IMMEDIATE LEFT BREAST RECONSTRUCTION WITH PLACEMENT OF TISSUE EXPANDER AND ALLODERM     FAMILY HISTORY: Family History  Problem Relation Age of Onset  . Stroke Mother   . Cancer Father     prostate    SOCIAL HISTORY: History  Substance Use Topics  . Smoking status: Never Smoker   . Smokeless tobacco: Never Used  . Alcohol Use: Yes    ALLERGIES: Allergies  Allergen Reactions  . Cephalosporins     headache  . Codeine Nausea Only     MEDICATIONS:  Current Outpatient Prescriptions  Medication Sig Dispense Refill  . hyaluronate sodium (RADIAPLEXRX) GEL Apply topically 2 (two) times daily.      Marland Kitchen HYDROcodone-acetaminophen (NORCO/VICODIN) 5-325 MG per tablet       . lidocaine-prilocaine (EMLA) cream Apply topically as needed. Apply to port as directed  30 g  6  . metoprolol succinate (TOPROL-XL) 100 MG 24 hr tablet Take 1 tablet (100 mg total) by mouth daily.  90 tablet  3  . non-metallic deodorant (ALRA) MISC Apply 1 application topically daily as needed.      . ondansetron (ZOFRAN) 4 MG tablet       . oxyCODONE-acetaminophen (ROXICET) 5-325 MG per tablet Take 1 tablet by mouth every 4 (four) hours as needed for pain.  30 tablet  0  . diazepam (VALIUM) 2 MG tablet        No current facility-administered medications for this visit.      REVIEW OF SYSTEMS: A 10 point review of systems was completed and is negative except as noted above.    PHYSICAL EXAMINATION: BP 128/81  Pulse 69  Temp(Src) 98.1 F (36.7 C) (Oral)  Wt 184 lb (83.462 kg)  BMI 30.62 kg/m2   General appearance: Alert, cooperative, well nourished, no apparent distress Head: Normocephalic, without obvious abnormality, the patient is wearing a hat Eyes: Conjunctivae/corneas clear, PERRLA, EOMI Nose: Nares, septum and mucosa are normal, no drainage or sinus tenderness Neck: No adenopathy, supple,  symmetrical, trachea midline, thyroid not enlarged, no tenderness Resp: Clear to auscultation bilaterally Cardio: Regular rate and rhythm, S1, S2 normal, no murmur, click, rub or gallop Breasts: Left breast has new surgical scars and is still in the process of healing, expander present, left axillary fullness, right breast no nipple inversion, no axilla fullness, benign breast exam GI: Soft, distended, non-tender, hypoactive bowel sounds, no organomegaly Extremities: Extremities normal, atraumatic, no cyanosis, left upper extremity lymphedema Lymph nodes: Cervical, supraclavicular, and axillary nodes normal Neurologic: Grossly normal    ECOG FS:  Grade 1 - Symptomatic but completely ambulatory   LAB RESULTS: Lab Results  Component Value Date   WBC 1.9* 05/06/2012   NEUTROABS 0.6* 05/06/2012   HGB 11.3* 05/06/2012   HCT 35.8 05/06/2012   MCV 99.7 05/06/2012   PLT 82* 05/06/2012      Chemistry      Component Value Date/Time   NA 144 05/06/2012 1027   NA 143 03/31/2012 1200   K 3.6 05/06/2012 1027   K 3.7 03/31/2012 1200   CL 111* 05/06/2012 1027   CL 107 03/31/2012  1200   CO2 25 05/06/2012 1027   CO2 29 03/31/2012 1200   BUN 8.7 05/06/2012 1027   BUN 7 03/31/2012 1200   CREATININE 0.7 05/06/2012 1027   CREATININE 0.67 03/31/2012 1200      Component Value Date/Time   CALCIUM 8.6 05/06/2012 1027   CALCIUM 8.6 03/31/2012 1200   ALKPHOS 65 05/06/2012 1027   AST 21 05/06/2012 1027   ALT 18 05/06/2012 1027   BILITOT 0.53 05/06/2012 1027       Lab Results  Component Value Date   LABCA2 70* 12/16/2011    No components found with this basename: [id number]     RADIOGRAPHIC STUDIES: No results found.  ASSESSMENT: 67 y.o. with: 1.  Stage II invasive mammary carcinoma of the left breast that is ER positive PR positive HER-2/neu positive with Ki-67 94%. Patient was interested in breast conservation. She received neoadjuvant chemotherapy consisting of Perjeta, Herceptin, and Taxotere. Chemotherapy regimen was  given every 21 days for a total of 4 cycles.  MRI of the breasts on 03/07/2012 showed a good interval response to neoadjuvant chemotherapy.  The patient completed all of her therapy on 03/04/2012.   2.  The patient is now status post left mastectomy with sentinel lymph node biopsy on 04/06/2012. She had a 0.8 cm grade 1 invasive ductal carcinoma with associated low-grade ductal carcinoma in situ. One sentinel node micrometastatic focus of ductal carcinoma. Treatment effect was noted within the primary lesion however nests of ductal carcinoma involving the lymphatics were still present. Margins were negative tumor was ER/PR +100%. She is status post immediate reconstruction with expanders placed at the time of her mastectomy.   3.  The patient was referred to radiation oncology for radiation therapy since she does have a micrometastatic in the lymph node. She will also continue Herceptin infusions every 3 weeks for 1 year.  4. Left upper extremity lymphedema  PLAN:  1.  The patient is continuing adjuvant Herceptin every 3 weeks.  She will proceed to cycle 2 of every 3 week Herceptin today.  Cycle 3 is scheduled for 05/27/2012.  2.  She is currently receiving radiation therapy and she completed day 3 of 35 scheduled radiation treatments today.   3. We plan to see Ms. Diegel again on 05/27/2012 for her next Herceptin infusion and we will collect   laboratories of the CMP and CBC prior to her infusion.  A 2-D echocardiogram will be scheduled for the patient in the near future.  4.  The patient declines a compression garment for upper extremity lymphedema at this time.  All questions were answered.  The patient was encouraged to contact us in the interim with any problems, questions or concerns.    Larina Bras, NP-C 05/06/2012, 7:23 PM

## 2012-05-06 NOTE — Telephone Encounter (Signed)
gve a copy of the echo referral to mrs linda for precert prior to scheduling.

## 2012-05-06 NOTE — Patient Instructions (Addendum)
[facility] Discharge Instructions for Patients Receiving Chemotherapy  Today you received the following chemotherapy agents: Herceptin.  To help prevent nausea and vomiting after your treatment, we encourage you to take your nausea medication.    If you develop nausea and vomiting that is not controlled by your nausea medication, call the clinic. If it is after clinic hours your family physician or the after hours number for the clinic or go to the Emergency Department.   BELOW ARE SYMPTOMS THAT SHOULD BE REPORTED IMMEDIATELY:  *FEVER GREATER THAN 100.5 F  *CHILLS WITH OR WITHOUT FEVER  NAUSEA AND VOMITING THAT IS NOT CONTROLLED WITH YOUR NAUSEA MEDICATION  *UNUSUAL SHORTNESS OF BREATH  *UNUSUAL BRUISING OR BLEEDING  TENDERNESS IN MOUTH AND THROAT WITH OR WITHOUT PRESENCE OF ULCERS  *URINARY PROBLEMS  *BOWEL PROBLEMS  UNUSUAL RASH Items with * indicate a potential emergency and should be followed up as soon as possible.   Feel free to call the clinic you have any questions or concerns. The clinic phone number is [phone].    

## 2012-05-09 ENCOUNTER — Ambulatory Visit
Admission: RE | Admit: 2012-05-09 | Discharge: 2012-05-09 | Disposition: A | Discharge status: Home / Self Care | Payer: BC Managed Care – PPO | Source: Ambulatory Visit | Attending: Radiation Oncology | Admitting: Radiation Oncology

## 2012-05-10 ENCOUNTER — Ambulatory Visit
Admission: RE | Admit: 2012-05-10 | Discharge: 2012-05-10 | Disposition: A | Discharge status: Home / Self Care | Payer: BC Managed Care – PPO | Source: Ambulatory Visit | Attending: Radiation Oncology | Admitting: Radiation Oncology

## 2012-05-10 ENCOUNTER — Encounter: Payer: Self-pay | Admitting: Radiation Oncology

## 2012-05-10 VITALS — BP 129/64 | HR 65 | Temp 98.8°F | Resp 20 | Wt 183.5 lb

## 2012-05-10 NOTE — Progress Notes (Signed)
Pt denies pain, fatigue, loss of appetite. Applying Radiaplex to left chest wall/breast treatment area.

## 2012-05-10 NOTE — Progress Notes (Signed)
Weekly Management Note Current Dose:  9 Gy  Projected Dose: 50.4 Gy   Narrative:  The patient presents for routine under treatment assessment.  CBCT/MVCT images/Port film x-rays were reviewed.  The chart was checked. Doing well. No complaints. No questions re: teaching.  Some arm soreness.   Physical Findings: Weight: 183 lb 8 oz (83.235 kg). Unchanged  Impression:  The patient is tolerating radiation.  Plan:  Continue treatment as planned. Encouraged her to attend ABC class.

## 2012-05-11 ENCOUNTER — Ambulatory Visit
Admission: RE | Admit: 2012-05-11 | Discharge: 2012-05-11 | Disposition: A | Discharge status: Home / Self Care | Payer: BC Managed Care – PPO | Source: Ambulatory Visit | Attending: Radiation Oncology | Admitting: Radiation Oncology

## 2012-05-12 ENCOUNTER — Ambulatory Visit
Admission: RE | Admit: 2012-05-12 | Discharge: 2012-05-12 | Disposition: A | Discharge status: Home / Self Care | Payer: BC Managed Care – PPO | Source: Ambulatory Visit | Attending: Radiation Oncology | Admitting: Radiation Oncology

## 2012-05-13 ENCOUNTER — Ambulatory Visit
Admission: RE | Admit: 2012-05-13 | Discharge: 2012-05-13 | Disposition: A | Discharge status: Home / Self Care | Payer: BC Managed Care – PPO | Source: Ambulatory Visit | Attending: Radiation Oncology | Admitting: Radiation Oncology

## 2012-05-16 ENCOUNTER — Ambulatory Visit
Admission: RE | Admit: 2012-05-16 | Discharge: 2012-05-16 | Disposition: A | Discharge status: Home / Self Care | Payer: BC Managed Care – PPO | Source: Ambulatory Visit | Attending: Radiation Oncology | Admitting: Radiation Oncology

## 2012-05-17 ENCOUNTER — Encounter: Payer: Self-pay | Admitting: Radiation Oncology

## 2012-05-17 ENCOUNTER — Ambulatory Visit
Admission: RE | Admit: 2012-05-17 | Discharge: 2012-05-17 | Disposition: A | Discharge status: Home / Self Care | Payer: BC Managed Care – PPO | Source: Ambulatory Visit | Attending: Radiation Oncology | Admitting: Radiation Oncology

## 2012-05-17 VITALS — BP 139/70 | HR 65 | Temp 98.2°F | Resp 20 | Wt 185.2 lb

## 2012-05-17 DIAGNOSIS — C50212 Malignant neoplasm of upper-inner quadrant of left female breast: Secondary | ICD-10-CM

## 2012-05-17 NOTE — Progress Notes (Signed)
Weekly Management Note Current Dose:18 Gy  Projected Dose:50.4  Gy   Narrative:  The patient presents for routine under treatment assessment.  CBCT/MVCT images/Port film x-rays were reviewed.  The chart was checked. Expander is painful but that is her only complaint.  Working.   Physical Findings: Weight: 185 lb 3.2 oz (84.006 kg). Unchanged  Impression:  The patient is tolerating radiation.  Plan:  Continue treatment as planned.

## 2012-05-17 NOTE — Progress Notes (Signed)
Pt on Cipro for cold but states she "is much better than last week". Pt applying Radiaplex to left breast tx area. She denies pain,  has some fatigue, no loss of appetite. She states the tissue expander in left breast "feels like an elephant on my chest at night".

## 2012-05-18 ENCOUNTER — Ambulatory Visit
Admission: RE | Admit: 2012-05-18 | Discharge: 2012-05-18 | Disposition: A | Discharge status: Home / Self Care | Payer: BC Managed Care – PPO | Source: Ambulatory Visit | Attending: Radiation Oncology | Admitting: Radiation Oncology

## 2012-05-19 ENCOUNTER — Ambulatory Visit
Admission: RE | Admit: 2012-05-19 | Discharge: 2012-05-19 | Disposition: A | Discharge status: Home / Self Care | Payer: BC Managed Care – PPO | Source: Ambulatory Visit | Attending: Radiation Oncology | Admitting: Radiation Oncology

## 2012-05-20 ENCOUNTER — Ambulatory Visit
Admission: RE | Admit: 2012-05-20 | Discharge: 2012-05-20 | Disposition: A | Discharge status: Home / Self Care | Payer: BC Managed Care – PPO | Source: Ambulatory Visit | Attending: Radiation Oncology | Admitting: Radiation Oncology

## 2012-05-23 ENCOUNTER — Ambulatory Visit
Admission: RE | Admit: 2012-05-23 | Discharge: 2012-05-23 | Disposition: A | Discharge status: Home / Self Care | Payer: BC Managed Care – PPO | Source: Ambulatory Visit | Attending: Radiation Oncology | Admitting: Radiation Oncology

## 2012-05-24 ENCOUNTER — Ambulatory Visit
Admission: RE | Admit: 2012-05-24 | Discharge: 2012-05-24 | Disposition: A | Discharge status: Home / Self Care | Payer: BC Managed Care – PPO | Source: Ambulatory Visit | Attending: Radiation Oncology | Admitting: Radiation Oncology

## 2012-05-24 ENCOUNTER — Encounter: Payer: Self-pay | Admitting: Radiation Oncology

## 2012-05-24 VITALS — BP 133/75 | HR 67 | Temp 98.7°F | Resp 20 | Wt 180.6 lb

## 2012-05-24 DIAGNOSIS — C50212 Malignant neoplasm of upper-inner quadrant of left female breast: Secondary | ICD-10-CM

## 2012-05-24 NOTE — Progress Notes (Signed)
Pt denies pain, fatigue, loss of appetite. She states she is adjusting to her left chest expander and sleeping better. She is applying Radiaplex to left breast/chest tx area and upper left back.  Pt asking what her cumulative dose of radiation will be; advised she discuss w/Dr.

## 2012-05-24 NOTE — Progress Notes (Signed)
[facility]    Radiation Oncology [address] [place]     Maryln Gottron, M.D. [place], [place] [address]               Billie Lade, M.D., Ph.D. Phone: [phone]      Molli Hazard A. Kathrynn Running, M.D. Fax: [phone]      Radene Gunning, M.D., Ph.D.         Lurline Hare, M.D.         Grayland Jack, M.D Weekly Treatment Management Note  Name: Marie Anderson     MRN: [id number]        CSN: [id number] Date: 05/24/2012      DOB: 1945-08-29  CC: Juluis Mire, MD         [place]    Status: Outpatient  Diagnosis: The encounter diagnosis was Cancer of upper-inner quadrant of female breast, left.  Current Dose: 27 Gy  Current Fraction: 15  Planned Dose: 50.4  Gy  Narrative: [name] was seen today for weekly treatment management. The chart was checked and port films  were reviewed. She is tolerating her treatments well at this time. She continues to have discomfort related to the expander. This will awaken her at night.  Cephalosporins and Codeine  Current Outpatient Prescriptions  Medication Sig Dispense Refill  . diazepam (VALIUM) 2 MG tablet       . hyaluronate sodium (RADIAPLEXRX) GEL Apply topically 2 (two) times daily.      Marland Kitchen HYDROcodone-acetaminophen (NORCO/VICODIN) 5-325 MG per tablet       . lidocaine-prilocaine (EMLA) cream Apply topically as needed. Apply to port as directed  30 g  6  . metoprolol succinate (TOPROL-XL) 100 MG 24 hr tablet Take 1 tablet (100 mg total) by mouth daily.  90 tablet  3  . non-metallic deodorant (ALRA) MISC Apply 1 application topically daily as needed.      . ondansetron (ZOFRAN) 4 MG tablet       . oxyCODONE-acetaminophen (ROXICET) 5-325 MG per tablet Take 1 tablet by mouth every 4 (four) hours as needed for pain.  30 tablet  0   No current facility-administered medications for this encounter.   Labs:  Lab Results  Component Value Date   WBC 1.9* 05/06/2012   HGB 11.3* 05/06/2012   HCT 35.8 05/06/2012   MCV 99.7  05/06/2012   PLT 82* 05/06/2012   Lab Results  Component Value Date   CREATININE 0.7 05/06/2012   BUN 8.7 05/06/2012   NA 144 05/06/2012   K 3.6 05/06/2012   CL 111* 05/06/2012   CO2 25 05/06/2012   Lab Results  Component Value Date   ALT 18 05/06/2012   AST 21 05/06/2012   BILITOT 0.53 05/06/2012    Physical Examination:  weight is 180 lb 9.6 oz (81.92 kg). Her oral temperature is 98.7 F (37.1 C). Her blood pressure is 133/75 and her pulse is 67. Her respiration is 20.    Wt Readings from Last 3 Encounters:  05/24/12 180 lb 9.6 oz (81.92 kg)  05/17/12 185 lb 3.2 oz (84.006 kg)  05/10/12 183 lb 8 oz (83.235 kg)    The left chest area shows some erythema and hyperpigmentation changes. There is no skin breakdown noted. Lungs - Normal respiratory effort, chest expands symmetrically. Lungs are clear to auscultation, no crackles or wheezes.  Heart has regular rhythm and rate  Abdomen is soft and non tender with normal bowel sounds  Assessment:  Patient tolerating treatments well  Plan: Continue treatment per original radiation prescription

## 2012-05-25 ENCOUNTER — Ambulatory Visit
Admission: RE | Admit: 2012-05-25 | Discharge: 2012-05-25 | Disposition: A | Discharge status: Home / Self Care | Payer: BC Managed Care – PPO | Source: Ambulatory Visit | Attending: Radiation Oncology | Admitting: Radiation Oncology

## 2012-05-26 ENCOUNTER — Ambulatory Visit
Admission: RE | Admit: 2012-05-26 | Discharge: 2012-05-26 | Disposition: A | Discharge status: Home / Self Care | Payer: BC Managed Care – PPO | Source: Ambulatory Visit | Attending: Radiation Oncology | Admitting: Radiation Oncology

## 2012-05-27 ENCOUNTER — Telehealth: Payer: Self-pay | Admitting: Oncology

## 2012-05-27 ENCOUNTER — Ambulatory Visit (HOSPITAL_BASED_OUTPATIENT_CLINIC_OR_DEPARTMENT_OTHER): Payer: BC Managed Care – PPO | Admitting: Adult Health

## 2012-05-27 ENCOUNTER — Ambulatory Visit
Admission: RE | Admit: 2012-05-27 | Discharge: 2012-05-27 | Disposition: A | Discharge status: Home / Self Care | Payer: BC Managed Care – PPO | Source: Ambulatory Visit | Attending: Radiation Oncology | Admitting: Radiation Oncology

## 2012-05-27 ENCOUNTER — Ambulatory Visit: Payer: BC Managed Care – PPO

## 2012-05-27 ENCOUNTER — Encounter: Payer: Self-pay | Admitting: Adult Health

## 2012-05-27 ENCOUNTER — Other Ambulatory Visit (HOSPITAL_BASED_OUTPATIENT_CLINIC_OR_DEPARTMENT_OTHER): Payer: BC Managed Care – PPO | Admitting: Lab

## 2012-05-27 VITALS — BP 125/77 | HR 73 | Temp 98.2°F | Resp 20 | Ht 65.0 in | Wt 182.2 lb

## 2012-05-27 DIAGNOSIS — I889 Nonspecific lymphadenitis, unspecified: Secondary | ICD-10-CM

## 2012-05-27 DIAGNOSIS — C50212 Malignant neoplasm of upper-inner quadrant of left female breast: Secondary | ICD-10-CM

## 2012-05-27 DIAGNOSIS — R52 Pain, unspecified: Secondary | ICD-10-CM

## 2012-05-27 DIAGNOSIS — C50219 Malignant neoplasm of upper-inner quadrant of unspecified female breast: Secondary | ICD-10-CM

## 2012-05-27 DIAGNOSIS — C50919 Malignant neoplasm of unspecified site of unspecified female breast: Secondary | ICD-10-CM

## 2012-05-27 DIAGNOSIS — Z79899 Other long term (current) drug therapy: Secondary | ICD-10-CM

## 2012-05-27 DIAGNOSIS — I890 Lymphedema, not elsewhere classified: Secondary | ICD-10-CM

## 2012-05-27 DIAGNOSIS — Z170 Estrogen receptor positive status [ER+]: Secondary | ICD-10-CM

## 2012-05-27 LAB — CBC WITH DIFFERENTIAL/PLATELET
BASO%: 0.6 % (ref 0.0–2.0)
Basophils Absolute: 0 10*3/uL (ref 0.0–0.1)
EOS%: 5.1 % (ref 0.0–7.0)
HCT: 36.1 % (ref 34.8–46.6)
LYMPH%: 28.7 % (ref 14.0–49.7)
MCH: 30.7 pg (ref 25.1–34.0)
MCHC: 31.9 g/dL (ref 31.5–36.0)
MCV: 96.3 fL (ref 79.5–101.0)
NEUT%: 52.2 % (ref 38.4–76.8)
Platelets: 73 10*3/uL — ABNORMAL LOW (ref 145–400)

## 2012-05-27 MED ORDER — OXYCODONE-ACETAMINOPHEN 5-325 MG PO TABS: 1.0000 | ORAL_TABLET | ORAL | Status: DC | PRN

## 2012-05-27 MED ORDER — AZITHROMYCIN 250 MG PO TABS: ORAL_TABLET | ORAL | Status: DC

## 2012-05-27 NOTE — Telephone Encounter (Signed)
gv pt appt schedule for April thru October including appt w/Dr. Gala Romney for 5/1. Echo to [facility] for preauth - Bonita Quin out. Per 3/28 pof echo immediately. Pt aware she will be contacted w/echo appt.

## 2012-05-27 NOTE — Progress Notes (Signed)
[facility]  [phone] [phone]  OFFICE PROGRESS NOTE  PATIENT: Marie Anderson   DOB: Aug 30, 1945  MR#: [id number]  GNF#:[id number]  QI:ONGEXB,MWUX S, MD Dr. Sigmund Hazel  Dr. Emelia Loron  Dr. Lurline Hare  DIAGNOSIS:  67 year old [place], [name] with invasive mammary carcinoma of the left breast ER positive PR positive HER-2/neu positive with Ki-67 94% diagnosed in 12/2011.   PRIOR THERAPY: 1.  The patient was originally seen in the Multidisciplinary Breast Clinic on 12/16/2011. She was diagnosed with stage II a HER-2 positive ER/PR positive breast cancer of the left breast at the 3:00 position. She also had a second area that was ER/PR positive but HER-2/neu negative. Patient is interested in breast conservation.   2.  The patient is now status post 4 cycles of Herceptin progenitor and Taxotere given every 21 days administered from 12/31/2011 to 03/11/2012.   3.  The patient had MRI of the breasts performed on 03/07/2012 that revealed good response to neoadjuvant treatment. She was referred to Dr. Emelia Loron for definitive surgery.   4.  The patient underwent a left mastectomy and sentinel lymph node biopsy on 04/06/2012. This showed a 0.8 cm grade 1 invasive ductal carcinoma with associated low-grade ductal carcinoma in situ. One sentinel lymph node had a micrometastatic focus of ductal carcinoma. Treatment effect was noted within the primary lesion however nests of ductal carcinoma involving the lymphatics were still present. The lymph node shows no evidence of tumor effect. The margins were widely negative. Her tumor was estrogen and progesterone receptor 100% positive. She did have immediate reconstruction with a expander placed at the time of her mastectomy.  5. The patient was referred to radiation oncology for radiation therapy. She will also continue Herceptin every 3 weeks.   CURRENT THERAPY:  Radiation  therapy currently underway with the patient scheduled for 35 treatments.  Herceptin infusions are being administered every 3 weeks.   INTERVAL HISTORY: Dr. Welton Flakes and I saw Ms. Parlato for followup of invasive mammary carcinoma of the left breast.  She is doing well today.  She denies fevers, chills, nausea, vomiting, chest pain, shortness of breath, swelling or any other concerns. She does have a swollen lymph node and nasal drainage, and a mild non-productive cough.  She denies orthopnea or PND.    PAST MEDICAL HISTORY: Past Medical History  Diagnosis Date  . Anemia   . SVT (supraventricular tachycardia)   . Breast cancer 12/04/11    left- inv ductal ca, DCIS, ER/PR +, HER 2 +  . History of chemotherapy 12/31/11 -03/11/12    s/p 4 cycles    PAST SURGICAL HISTORY: Past Surgical History  Procedure Laterality Date  . Dilation and curettage of uterus    . Eye surgery      age 52-lt eye growth  . Colonoscopy    . Portacath placement  12/23/2011    Procedure: INSERTION PORT-A-CATH;  Surgeon: Emelia Loron, MD;  Location: [facility];  Service: General;  Laterality: Right;  . Mastectomy w/ sentinel node biopsy  04/06/2012    Procedure: MASTECTOMY WITH SENTINEL LYMPH NODE BIOPSY;  Surgeon: Emelia Loron, MD;  Location:  SURGERY CENTER;  Service: General;  Laterality: Left;  left mastectomy, left axillary sentinel node biopsy  . Breast reconstruction with placement of tissue expander and flex hd (acellular hydrated dermis)  04/06/2012    Procedure: BREAST RECONSTRUCTION WITH PLACEMENT OF TISSUE EXPANDER AND FLEX HD (ACELLULAR HYDRATED DERMIS);  Surgeon:  Claire Sanger, DO;  Location: [facility];  Service: Plastics;  Laterality: Left;  IMMEDIATE LEFT BREAST RECONSTRUCTION WITH PLACEMENT OF TISSUE EXPANDER AND ALLODERM     FAMILY HISTORY: Family History  Problem Relation Age of Onset  . Stroke Mother   . Cancer Father     prostate    SOCIAL  HISTORY: History  Substance Use Topics  . Smoking status: Never Smoker   . Smokeless tobacco: Never Used  . Alcohol Use: Yes    ALLERGIES: Allergies  Allergen Reactions  . Cephalosporins     headache  . Codeine Nausea Only     MEDICATIONS:  Current Outpatient Prescriptions  Medication Sig Dispense Refill  . diazepam (VALIUM) 2 MG tablet       . hyaluronate sodium (RADIAPLEXRX) GEL Apply topically 2 (two) times daily.      Marland Kitchen HYDROcodone-acetaminophen (NORCO/VICODIN) 5-325 MG per tablet       . lidocaine-prilocaine (EMLA) cream Apply topically as needed. Apply to port as directed  30 g  6  . metoprolol succinate (TOPROL-XL) 100 MG 24 hr tablet Take 1 tablet (100 mg total) by mouth daily.  90 tablet  3  . non-metallic deodorant (ALRA) MISC Apply 1 application topically daily as needed.      . ondansetron (ZOFRAN) 4 MG tablet       . oxyCODONE-acetaminophen (ROXICET) 5-325 MG per tablet Take 1 tablet by mouth every 4 (four) hours as needed for pain.  30 tablet  0   No current facility-administered medications for this visit.      REVIEW OF SYSTEMS: A 10 point review of systems was completed and is negative except as noted above.    PHYSICAL EXAMINATION: BP 125/77  Pulse 73  Temp(Src) 98.2 F (36.8 C) (Oral)  Resp 20  Ht 5\' 5"  (1.651 m)  Wt 182 lb 3.2 oz (82.645 kg)  BMI 30.32 kg/m2   General appearance: Alert, cooperative, well nourished, no apparent distress Head: Normocephalic, without obvious abnormality, the patient is wearing a hat Eyes: Conjunctivae/corneas clear, PERRLA, EOMI Nose: Nares, septum and mucosa are normal, no drainage or sinus tenderness Neck: No adenopathy, supple, symmetrical, trachea midline, thyroid not enlarged, no tenderness Resp: Clear to auscultation bilaterally Cardio: Regular rate and rhythm, S1, S2 normal, no murmur, click, rub or gallop Breasts: Left breast has new surgical scars and is still in the process of healing, expander present,  left axillary fullness, right breast no nipple inversion, no axilla fullness, benign breast exam GI: Soft, distended, non-tender, hypoactive bowel sounds, no organomegaly Extremities: Extremities normal, atraumatic, no cyanosis, left upper extremity lymphedema Lymph nodes: Cervical, supraclavicular, and axillary nodes normal Neurologic: Grossly normal    ECOG FS:  Grade 1 - Symptomatic but completely ambulatory   LAB RESULTS: Lab Results  Component Value Date   WBC 1.6* 05/27/2012   NEUTROABS 0.8* 05/27/2012   HGB 11.5* 05/27/2012   HCT 36.1 05/27/2012   MCV 96.3 05/27/2012   PLT 73* 05/27/2012      Chemistry      Component Value Date/Time   NA 144 05/06/2012 1027   NA 143 03/31/2012 1200   K 3.6 05/06/2012 1027   K 3.7 03/31/2012 1200   CL 111* 05/06/2012 1027   CL 107 03/31/2012 1200   CO2 25 05/06/2012 1027   CO2 29 03/31/2012 1200   BUN 8.7 05/06/2012 1027   BUN 7 03/31/2012 1200   CREATININE 0.7 05/06/2012 1027   CREATININE 0.67 03/31/2012 1200  Component Value Date/Time   CALCIUM 8.6 05/06/2012 1027   CALCIUM 8.6 03/31/2012 1200   ALKPHOS 65 05/06/2012 1027   AST 21 05/06/2012 1027   ALT 18 05/06/2012 1027   BILITOT 0.53 05/06/2012 1027       Lab Results  Component Value Date   LABCA2 70* 12/16/2011    No components found with this basename: [id number]     RADIOGRAPHIC STUDIES: No results found.  ASSESSMENT: 67 y.o. with: 1.  Stage II invasive mammary carcinoma of the left breast that is ER positive PR positive HER-2/neu positive with Ki-67 94%. Patient was interested in breast conservation. She received neoadjuvant chemotherapy consisting of Perjeta, Herceptin, and Taxotere. Chemotherapy regimen was given every 21 days for a total of 4 cycles.  MRI of the breasts on 03/07/2012 showed a good interval response to neoadjuvant chemotherapy.  The patient completed all of her therapy on 03/04/2012.   2.  The patient is now status post left mastectomy with sentinel lymph node biopsy on  04/06/2012. She had a 0.8 cm grade 1 invasive ductal carcinoma with associated low-grade ductal carcinoma in situ. One sentinel node micrometastatic focus of ductal carcinoma. Treatment effect was noted within the primary lesion however nests of ductal carcinoma involving the lymphatics were still present. Margins were negative tumor was ER/PR +100%. She is status post immediate reconstruction with expanders placed at the time of her mastectomy.   3.  The patient was referred to radiation oncology for radiation therapy since she does have a micrometastatic in the lymph node. She will also continue Herceptin infusions every 3 weeks for 1 year.  4. Left upper extremity lymphedema  PLAN:  1.  The patient is continuing adjuvant Herceptin every 3 weeks. Her last echo was in October.  She unfortunately cannot receive any further Herceptin until we repeat it.    2.  She is currently receiving radiation therapy.  I will send Dr. Michell Heinrich a message regarding Ms. Friley's labs.     3. We plan to see Ms. Heenan again in three weeks for her next Herceptin infusion and we will collect laboratories of the CMP and CBC prior to her infusion.   4.  The patient declines a compression garment for upper extremity lymphedema at this time.  All questions were answered.  The patient was encouraged to contact us in the interim with any problems, questions or concerns.   I spent 25 minutes counseling the patient face to face.  The total time spent in the appointment was 30 minutes.  Cherie Ouch Lyn Hollingshead, NP Medical Oncology [facility] Phone: [phone]    05/27/2012, 9:47 AM

## 2012-05-27 NOTE — Telephone Encounter (Signed)
lmonvm for pt re appt for echo 4/2. Then pt called back and was given appt for echo 4/2 @ [facility] @ 11am. Pt was given schedule prior to leaving today. Per Ebony preauth#[id number].

## 2012-05-27 NOTE — Patient Instructions (Addendum)
Doing well.  We will hold herceptin today and evaluate the heart.  Please call us if you have any questions or concerns.

## 2012-05-30 ENCOUNTER — Ambulatory Visit
Admission: RE | Admit: 2012-05-30 | Discharge: 2012-05-30 | Disposition: A | Discharge status: Home / Self Care | Payer: BC Managed Care – PPO | Source: Ambulatory Visit | Attending: Radiation Oncology | Admitting: Radiation Oncology

## 2012-05-31 ENCOUNTER — Encounter: Payer: Self-pay | Admitting: Radiation Oncology

## 2012-05-31 ENCOUNTER — Ambulatory Visit
Admission: RE | Admit: 2012-05-31 | Discharge: 2012-05-31 | Disposition: A | Discharge status: Home / Self Care | Payer: BC Managed Care – PPO | Source: Ambulatory Visit | Attending: Radiation Oncology | Admitting: Radiation Oncology

## 2012-05-31 DIAGNOSIS — C50212 Malignant neoplasm of upper-inner quadrant of left female breast: Secondary | ICD-10-CM

## 2012-05-31 MED ORDER — RADIAPLEXRX EX GEL
Freq: Once | CUTANEOUS | Status: AC
  Administered 2012-05-31: 11:00:00 via TOPICAL

## 2012-05-31 NOTE — Progress Notes (Signed)
Pt not seen in nursing; no VS for this visit.  Dr Michell Heinrich saw pt in treatment area.

## 2012-05-31 NOTE — Progress Notes (Signed)
Weekly Management Note Current Dose:  36 Gy  Projected Dose: 50.4 Gy   Narrative:  The patient presents for routine under treatment assessment.  CBCT/MVCT images/Port film x-rays were reviewed.  The chart was checked. Saw on treatment machine for electron mark out.   Physical Findings: dermatitis medially. Rest of skin intact.  Impression:  The patient is tolerating radiation.  Plan:  Continue treatment as planned. Add hydrocortisone medially.

## 2012-05-31 NOTE — Progress Notes (Signed)
Name: Marie Anderson   MRN: [id number]  Date:  05/31/2012   DOB: [birth year]  Status:outpatient    DIAGNOSIS: Breast cancer.  CONSENT VERIFIED: yes   SET UP: Patient is setup supine   IMMOBILIZATION:  The following immobilization was used:Custom Moldable Pillow, breast board.   NARRATIVE: Marie Anderson underwent complex simulation and treatment planning for her boost treatment today.  Her scar was outlined on the treatment machine. Due to the convexity of her chest wall contour, 2 fields will be necessary.    6  MeV electrons will be prescribed to the 90% Isodose line.   A block will be used for beam modification purposes.  A special port plan is requested.

## 2012-06-01 ENCOUNTER — Ambulatory Visit (HOSPITAL_COMMUNITY)
Admission: RE | Admit: 2012-06-01 | Discharge: 2012-06-01 | Disposition: A | Discharge status: Home / Self Care | Payer: BC Managed Care – PPO | Source: Ambulatory Visit | Attending: Oncology | Admitting: Oncology

## 2012-06-01 ENCOUNTER — Ambulatory Visit
Admission: RE | Admit: 2012-06-01 | Discharge: 2012-06-01 | Disposition: A | Discharge status: Home / Self Care | Payer: BC Managed Care – PPO | Source: Ambulatory Visit | Attending: Radiation Oncology | Admitting: Radiation Oncology

## 2012-06-01 DIAGNOSIS — C50212 Malignant neoplasm of upper-inner quadrant of left female breast: Secondary | ICD-10-CM

## 2012-06-01 DIAGNOSIS — Z09 Encounter for follow-up examination after completed treatment for conditions other than malignant neoplasm: Secondary | ICD-10-CM | POA: Insufficient documentation

## 2012-06-01 DIAGNOSIS — I498 Other specified cardiac arrhythmias: Secondary | ICD-10-CM | POA: Insufficient documentation

## 2012-06-01 DIAGNOSIS — Z79899 Other long term (current) drug therapy: Secondary | ICD-10-CM

## 2012-06-01 DIAGNOSIS — C50919 Malignant neoplasm of unspecified site of unspecified female breast: Secondary | ICD-10-CM | POA: Insufficient documentation

## 2012-06-01 NOTE — Progress Notes (Signed)
  Echocardiogram 2D Echocardiogram has been performed.  Tiras Bianchini, [facility] 06/01/2012, 11:24 AM

## 2012-06-02 ENCOUNTER — Ambulatory Visit
Admission: RE | Admit: 2012-06-02 | Discharge: 2012-06-02 | Disposition: A | Discharge status: Home / Self Care | Payer: BC Managed Care – PPO | Source: Ambulatory Visit | Attending: Radiation Oncology | Admitting: Radiation Oncology

## 2012-06-03 ENCOUNTER — Ambulatory Visit
Admission: RE | Admit: 2012-06-03 | Discharge: 2012-06-03 | Disposition: A | Discharge status: Home / Self Care | Payer: BC Managed Care – PPO | Source: Ambulatory Visit | Attending: Radiation Oncology | Admitting: Radiation Oncology

## 2012-06-06 ENCOUNTER — Ambulatory Visit: Payer: BC Managed Care – PPO

## 2012-06-07 ENCOUNTER — Ambulatory Visit
Admission: RE | Admit: 2012-06-07 | Discharge: 2012-06-07 | Disposition: A | Discharge status: Home / Self Care | Payer: BC Managed Care – PPO | Source: Ambulatory Visit | Attending: Radiation Oncology | Admitting: Radiation Oncology

## 2012-06-07 ENCOUNTER — Encounter: Payer: Self-pay | Admitting: Radiation Oncology

## 2012-06-07 VITALS — BP 134/77 | HR 66 | Temp 97.9°F | Resp 20 | Wt 185.0 lb

## 2012-06-07 DIAGNOSIS — C50212 Malignant neoplasm of upper-inner quadrant of left female breast: Secondary | ICD-10-CM

## 2012-06-07 NOTE — Progress Notes (Signed)
patient here weekly rad txs,alert,oriented x3, skin dermatitis on front left chest wall and starting on back, patient usong radiaplex gel only,hasn't bought cortisone cream yet for the itching, no pain, eating and drinking well, no nausea, energy level okay stated, c/o itching 11:06 AM

## 2012-06-07 NOTE — Progress Notes (Signed)
Weekly Management Note Current Dose:  43.2 Gy  Projected Dose:50.4  Gy   Narrative:  The patient presents for routine under treatment assessment.  CBCT/MVCT images/Port film x-rays were reviewed.  The chart was checked. Doing well. Itching medially. Not using cortisone yet.   Physical Findings: Weight: 185 lb (83.915 kg). Dry desquamation/dermatitis medially. Mild pink over implant and scar.   Impression:  The patient is tolerating radiation.  Plan:  Continue treatment as planned. Add hydrocortisone prn.

## 2012-06-08 ENCOUNTER — Ambulatory Visit
Admission: RE | Admit: 2012-06-08 | Discharge: 2012-06-08 | Disposition: A | Discharge status: Home / Self Care | Payer: BC Managed Care – PPO | Source: Ambulatory Visit | Attending: Radiation Oncology | Admitting: Radiation Oncology

## 2012-06-09 ENCOUNTER — Ambulatory Visit
Admission: RE | Admit: 2012-06-09 | Discharge: 2012-06-09 | Disposition: A | Discharge status: Home / Self Care | Payer: BC Managed Care – PPO | Source: Ambulatory Visit | Attending: Radiation Oncology | Admitting: Radiation Oncology

## 2012-06-10 ENCOUNTER — Ambulatory Visit
Admission: RE | Admit: 2012-06-10 | Discharge: 2012-06-10 | Disposition: A | Discharge status: Home / Self Care | Payer: BC Managed Care – PPO | Source: Ambulatory Visit | Attending: Radiation Oncology | Admitting: Radiation Oncology

## 2012-06-13 ENCOUNTER — Ambulatory Visit: Payer: BC Managed Care – PPO

## 2012-06-13 ENCOUNTER — Ambulatory Visit
Admission: RE | Admit: 2012-06-13 | Discharge: 2012-06-13 | Disposition: A | Discharge status: Home / Self Care | Payer: BC Managed Care – PPO | Source: Ambulatory Visit | Attending: Radiation Oncology | Admitting: Radiation Oncology

## 2012-06-14 ENCOUNTER — Telehealth: Payer: Self-pay | Admitting: Oncology

## 2012-06-14 ENCOUNTER — Ambulatory Visit
Admission: RE | Admit: 2012-06-14 | Discharge: 2012-06-14 | Disposition: A | Discharge status: Home / Self Care | Payer: BC Managed Care – PPO | Source: Ambulatory Visit | Attending: Radiation Oncology | Admitting: Radiation Oncology

## 2012-06-14 ENCOUNTER — Encounter: Payer: Self-pay | Admitting: Radiation Oncology

## 2012-06-14 ENCOUNTER — Ambulatory Visit: Payer: BC Managed Care – PPO

## 2012-06-14 VITALS — BP 155/80 | HR 62 | Temp 98.4°F | Resp 20 | Wt 184.4 lb

## 2012-06-14 DIAGNOSIS — C50212 Malignant neoplasm of upper-inner quadrant of left female breast: Secondary | ICD-10-CM

## 2012-06-14 NOTE — Telephone Encounter (Signed)
8/1 appt moved from [facility] to [facility] due to LA on PAL. Pt has lb/fu/tx and start time remains the same.

## 2012-06-14 NOTE — Progress Notes (Signed)
Pt denies pain, fatigue, loss of appetite. She is applying Cortisone cream to upper chest wall area w/fair relief of itching, applying Radiaplex to left chest wall tx area.

## 2012-06-14 NOTE — Progress Notes (Signed)
Weekly Management Note Current Dose: 52.4  Gy  Projected Dose: 60.4 Gy   Narrative:  The patient presents for routine under treatment assessment.  CBCT/MVCT images/Port film x-rays were reviewed.  The chart was checked. Doing well. Some soreness medially using cortisone with good relief. Appt in med onc on Friday.   Physical Findings: Weight: 184 lb 6.4 oz (83.643 kg). Unchanged. Dry desquamation in medial aspect of chest. Pink in axilla. No moist desquamation.   Impression:  The patient is tolerating radiation.  Plan:  Continue treatment as planned. Continue radiaplex x 2 weeks after treatment then lotion with vit e.  Discussed tissue massage.

## 2012-06-15 ENCOUNTER — Ambulatory Visit
Admission: RE | Admit: 2012-06-15 | Discharge: 2012-06-15 | Disposition: A | Discharge status: Home / Self Care | Payer: BC Managed Care – PPO | Source: Ambulatory Visit | Attending: Radiation Oncology | Admitting: Radiation Oncology

## 2012-06-16 ENCOUNTER — Ambulatory Visit
Admission: RE | Admit: 2012-06-16 | Discharge: 2012-06-16 | Disposition: A | Discharge status: Home / Self Care | Payer: BC Managed Care – PPO | Source: Ambulatory Visit | Attending: Radiation Oncology | Admitting: Radiation Oncology

## 2012-06-17 ENCOUNTER — Ambulatory Visit: Payer: BC Managed Care – PPO

## 2012-06-17 ENCOUNTER — Ambulatory Visit
Admission: RE | Admit: 2012-06-17 | Discharge: 2012-06-17 | Disposition: A | Discharge status: Home / Self Care | Payer: BC Managed Care – PPO | Source: Ambulatory Visit | Attending: Radiation Oncology | Admitting: Radiation Oncology

## 2012-06-17 ENCOUNTER — Ambulatory Visit (HOSPITAL_BASED_OUTPATIENT_CLINIC_OR_DEPARTMENT_OTHER): Payer: BC Managed Care – PPO

## 2012-06-17 ENCOUNTER — Other Ambulatory Visit (HOSPITAL_BASED_OUTPATIENT_CLINIC_OR_DEPARTMENT_OTHER): Payer: BC Managed Care – PPO | Admitting: Lab

## 2012-06-17 ENCOUNTER — Ambulatory Visit (HOSPITAL_BASED_OUTPATIENT_CLINIC_OR_DEPARTMENT_OTHER): Payer: BC Managed Care – PPO | Admitting: Adult Health

## 2012-06-17 ENCOUNTER — Encounter: Payer: Self-pay | Admitting: Adult Health

## 2012-06-17 VITALS — BP 150/78 | HR 76 | Temp 98.3°F | Resp 20 | Ht 65.0 in | Wt 182.8 lb

## 2012-06-17 DIAGNOSIS — C50219 Malignant neoplasm of upper-inner quadrant of unspecified female breast: Secondary | ICD-10-CM

## 2012-06-17 DIAGNOSIS — C50212 Malignant neoplasm of upper-inner quadrant of left female breast: Secondary | ICD-10-CM

## 2012-06-17 DIAGNOSIS — I890 Lymphedema, not elsewhere classified: Secondary | ICD-10-CM

## 2012-06-17 DIAGNOSIS — Z5112 Encounter for antineoplastic immunotherapy: Secondary | ICD-10-CM

## 2012-06-17 DIAGNOSIS — Z170 Estrogen receptor positive status [ER+]: Secondary | ICD-10-CM

## 2012-06-17 DIAGNOSIS — C50919 Malignant neoplasm of unspecified site of unspecified female breast: Secondary | ICD-10-CM

## 2012-06-17 DIAGNOSIS — L539 Erythematous condition, unspecified: Secondary | ICD-10-CM

## 2012-06-17 LAB — CBC WITH DIFFERENTIAL/PLATELET
BASO%: 2.2 % — ABNORMAL HIGH (ref 0.0–2.0)
EOS%: 3.4 % (ref 0.0–7.0)
HCT: 37.6 % (ref 34.8–46.6)
MCHC: 33 g/dL (ref 31.5–36.0)
MONO#: 0.3 10*3/uL (ref 0.1–0.9)
NEUT%: 51.1 % (ref 38.4–76.8)
RBC: 4 10*6/uL (ref 3.70–5.45)
RDW: 13.9 % (ref 11.2–14.5)
WBC: 1.8 10*3/uL — ABNORMAL LOW (ref 3.9–10.3)
lymph#: 0.4 10*3/uL — ABNORMAL LOW (ref 0.9–3.3)
nRBC: 0 % (ref 0–0)

## 2012-06-17 LAB — COMPREHENSIVE METABOLIC PANEL (CC13)
ALT: 13 U/L (ref 0–55)
AST: 20 U/L (ref 5–34)
Albumin: 3.6 g/dL (ref 3.5–5.0)
Alkaline Phosphatase: 74 U/L (ref 40–150)
Calcium: 8.9 mg/dL (ref 8.4–10.4)
Chloride: 110 mEq/L — ABNORMAL HIGH (ref 98–107)
Potassium: 4 mEq/L (ref 3.5–5.1)
Sodium: 143 mEq/L (ref 136–145)
Total Protein: 6.6 g/dL (ref 6.4–8.3)

## 2012-06-17 MED ORDER — ACETAMINOPHEN 325 MG PO TABS: 650.0000 mg | ORAL_TABLET | Freq: Once | ORAL | Status: DC

## 2012-06-17 MED ORDER — DIPHENHYDRAMINE HCL 25 MG PO CAPS: 50.0000 mg | ORAL_CAPSULE | Freq: Once | ORAL | Status: DC

## 2012-06-17 MED ORDER — SODIUM CHLORIDE 0.9 % IJ SOLN
10.0000 mL | INTRAMUSCULAR | Status: DC | PRN
  Administered 2012-06-17: 10 mL
  Filled 2012-06-17: qty 10

## 2012-06-17 MED ORDER — TRASTUZUMAB CHEMO INJECTION 440 MG
6.0000 mg/kg | Freq: Once | INTRAVENOUS | Status: AC
  Administered 2012-06-17: 504 mg via INTRAVENOUS
  Filled 2012-06-17: qty 24

## 2012-06-17 MED ORDER — HEPARIN SOD (PORK) LOCK FLUSH 100 UNIT/ML IV SOLN
500.0000 [IU] | Freq: Once | INTRAVENOUS | Status: AC | PRN
  Administered 2012-06-17: 500 [IU]
  Filled 2012-06-17: qty 5

## 2012-06-17 MED ORDER — SODIUM CHLORIDE 0.9 % IV SOLN
Freq: Once | INTRAVENOUS | Status: AC
  Administered 2012-06-17: 10:00:00 via INTRAVENOUS

## 2012-06-17 NOTE — Progress Notes (Signed)
[facility]  Telephone:([phone]) [phone] Fax:(336) [phone]  OFFICE PROGRESS NOTE  PATIENT: Marie Anderson   DOB: 11/17/1945  MR#: [id number]  ION#:[id number]  KG:[id number] S, MD Dr. Sigmund Hazel  Dr. Emelia Loron  Dr. Lurline Hare  DIAGNOSIS:  67 year old [place], [name] with invasive mammary carcinoma of the left breast ER positive PR positive HER-2/neu positive with Ki-67 94% diagnosed in 12/2011.   PRIOR THERAPY: 1.  The patient was originally seen in the Multidisciplinary Breast Clinic on 12/16/2011. She was diagnosed with stage II a HER-2 positive ER/PR positive breast cancer of the left breast at the 3:00 position. She also had a second area that was ER/PR positive but HER-2/neu negative. Patient is interested in breast conservation.   2.  The patient is now status post 4 cycles of Herceptin progenitor and Taxotere given every 21 days administered from 12/31/2011 to 03/11/2012.   3.  The patient had MRI of the breasts performed on 03/07/2012 that revealed good response to neoadjuvant treatment. She was referred to Dr. Emelia Loron for definitive surgery.   4.  The patient underwent a left mastectomy and sentinel lymph node biopsy on 04/06/2012. This showed a 0.8 cm grade 1 invasive ductal carcinoma with associated low-grade ductal carcinoma in situ. One sentinel lymph node had a micrometastatic focus of ductal carcinoma. Treatment effect was noted within the primary lesion however nests of ductal carcinoma involving the lymphatics were still present. The lymph node shows no evidence of tumor effect. The margins were widely negative. Her tumor was estrogen and progesterone receptor 100% positive. She did have immediate reconstruction with a expander placed at the time of her mastectomy.  5. The patient was referred to radiation oncology for radiation therapy. She will also continue Herceptin every 3 weeks.   CURRENT THERAPY:  Radiation  therapy currently underway with the patient scheduled for 35 treatments.  Herceptin infusions are being administered every 3 weeks.   INTERVAL HISTORY: Dr. Welton Flakes and I saw [name] for followup of invasive mammary carcinoma of the left breast.  She is doing well today.  She missed her last Herceptin due to not having an echo for too long.  She is feeling moderately well today.  She is having a lot of erythema and tenderness on her left chest wall where she's getting the radiation and where her expander is.  She takes roxicet for the pain.  Her last radiation treatment is Monday.  She denies fevers, chills, nausea, vomiting, pain, constipation, diarrhea, shortness of breath.  She does have mild numbness in her fingertips, and has lost 4 finger nails thus far.  She can button, and pick up things.  She only has difficulty with pop tabs on sodas.  Otherwise, a 10 point ROS is neg.   PAST MEDICAL HISTORY: Past Medical History  Diagnosis Date  . Anemia   . SVT (supraventricular tachycardia)   . Breast cancer 12/04/11    left- inv ductal ca, DCIS, ER/PR +, HER 2 +  . History of chemotherapy 12/31/11 -03/11/12    s/p 4 cycles    PAST SURGICAL HISTORY: Past Surgical History  Procedure Laterality Date  . Dilation and curettage of uterus    . Eye surgery      age 89-lt eye growth  . Colonoscopy    . Portacath placement  12/23/2011    Procedure: INSERTION PORT-A-CATH;  Surgeon: Emelia Loron, MD;  Location: [facility];  Service: General;  Laterality: Right;  .  Mastectomy w/ sentinel node biopsy  04/06/2012    Procedure: MASTECTOMY WITH SENTINEL LYMPH NODE BIOPSY;  Surgeon: Emelia Loron, MD;  Location: [facility];  Service: General;  Laterality: Left;  left mastectomy, left axillary sentinel node biopsy  . Breast reconstruction with placement of tissue expander and flex hd (acellular hydrated dermis)  04/06/2012    Procedure: BREAST RECONSTRUCTION WITH PLACEMENT OF  TISSUE EXPANDER AND FLEX HD (ACELLULAR HYDRATED DERMIS);  Surgeon: Wayland Denis, DO;  Location: [facility];  Service: Plastics;  Laterality: Left;  IMMEDIATE LEFT BREAST RECONSTRUCTION WITH PLACEMENT OF TISSUE EXPANDER AND ALLODERM     FAMILY HISTORY: Family History  Problem Relation Age of Onset  . Stroke Mother   . Cancer Father     prostate    SOCIAL HISTORY: History  Substance Use Topics  . Smoking status: Never Smoker   . Smokeless tobacco: Never Used  . Alcohol Use: Yes    ALLERGIES: Allergies  Allergen Reactions  . Cephalosporins     headache  . Codeine Nausea Only     MEDICATIONS:  Current Outpatient Prescriptions  Medication Sig Dispense Refill  . diazepam (VALIUM) 2 MG tablet Take 2 mg by mouth as needed.       . hyaluronate sodium (RADIAPLEXRX) GEL Apply topically 2 (two) times daily.      Marland Kitchen HYDROcodone-acetaminophen (NORCO/VICODIN) 5-325 MG per tablet       . lidocaine-prilocaine (EMLA) cream Apply topically as needed. Apply to port as directed  30 g  6  . metoprolol succinate (TOPROL-XL) 100 MG 24 hr tablet Take 1 tablet (100 mg total) by mouth daily.  90 tablet  3  . non-metallic deodorant (ALRA) MISC Apply 1 application topically daily as needed.      . ondansetron (ZOFRAN) 4 MG tablet Take 4 mg by mouth every 8 (eight) hours as needed.       Marland Kitchen oxyCODONE-acetaminophen (ROXICET) 5-325 MG per tablet Take 1 tablet by mouth every 4 (four) hours as needed for pain.  30 tablet  0   No current facility-administered medications for this visit.      REVIEW OF SYSTEMS: A 10 point review of systems was completed and is negative except as noted above.    PHYSICAL EXAMINATION: BP 150/78  Pulse 76  Temp(Src) 98.3 F (36.8 C) (Oral)  Resp 20  Ht 5\' 5"  (1.651 m)  Wt 182 lb 12.8 oz (82.918 kg)  BMI 30.42 kg/m2   General appearance: Alert, cooperative, well nourished, no apparent distress Head: Normocephalic, without obvious abnormality, the  patient is wearing a hat Eyes: Conjunctivae/corneas clear, PERRLA, EOMI Nose: Nares, septum and mucosa are normal, no drainage or sinus tenderness Neck: No adenopathy, supple, symmetrical, trachea midline, thyroid not enlarged, no tenderness Resp: Clear to auscultation bilaterally Cardio: Regular rate and rhythm, S1, S2 normal, no murmur, click, rub or gallop Breasts: Left breast has new surgical scars and is still in the process of healing, expander present, erythema to left chest wall, particularly medially and under axilla, no purulent discharge, no warmth or swelling (patient numb in area so unable to evaluate for tenderness), right breast no nipple inversion, no axilla fullness, benign breast exam GI: Soft, distended, non-tender, hypoactive bowel sounds, no organomegaly Extremities: Extremities normal, atraumatic, no cyanosis, left upper extremity lymphedema Lymph nodes: Cervical, supraclavicular, and axillary nodes normal Neurologic: Grossly normal    ECOG FS:  Grade 1 - Symptomatic but completely ambulatory   LAB RESULTS: Lab Results  Component Value Date   WBC 1.8* 06/17/2012   NEUTROABS 0.9* 06/17/2012   HGB 12.4 06/17/2012   HCT 37.6 06/17/2012   MCV 94.0 06/17/2012   PLT 71* 06/17/2012      Chemistry      Component Value Date/Time   NA 144 05/06/2012 1027   NA 143 03/31/2012 1200   K 3.6 05/06/2012 1027   K 3.7 03/31/2012 1200   CL 111* 05/06/2012 1027   CL 107 03/31/2012 1200   CO2 25 05/06/2012 1027   CO2 29 03/31/2012 1200   BUN 8.7 05/06/2012 1027   BUN 7 03/31/2012 1200   CREATININE 0.7 05/06/2012 1027   CREATININE 0.67 03/31/2012 1200      Component Value Date/Time   CALCIUM 8.6 05/06/2012 1027   CALCIUM 8.6 03/31/2012 1200   ALKPHOS 65 05/06/2012 1027   AST 21 05/06/2012 1027   ALT 18 05/06/2012 1027   BILITOT 0.53 05/06/2012 1027       Lab Results  Component Value Date   LABCA2 70* 12/16/2011    No components found with this basename: [id number]     RADIOGRAPHIC  STUDIES: No results found.  ASSESSMENT: 67 y.o. with: 1.  Stage II invasive mammary carcinoma of the left breast that is ER positive PR positive HER-2/neu positive with Ki-67 94%. Patient was interested in breast conservation. She received neoadjuvant chemotherapy consisting of Perjeta, Herceptin, and Taxotere. Chemotherapy regimen was given every 21 days for a total of 4 cycles.  MRI of the breasts on 03/07/2012 showed a good interval response to neoadjuvant chemotherapy.  The patient completed all of her therapy on 03/04/2012.   2.  The patient is now status post left mastectomy with sentinel lymph node biopsy on 04/06/2012. She had a 0.8 cm grade 1 invasive ductal carcinoma with associated low-grade ductal carcinoma in situ. One sentinel node micrometastatic focus of ductal carcinoma. Treatment effect was noted within the primary lesion however nests of ductal carcinoma involving the lymphatics were still present. Margins were negative tumor was ER/PR +100%. She is status post immediate reconstruction with expanders placed at the time of her mastectomy.   3.  The patient was referred to radiation oncology for radiation therapy since she does have a micrometastatic in the lymph node. She will also continue Herceptin infusions every 3 weeks for 1 year.  4. Left upper extremity lymphedema  PLAN:  1.  The patient is continuing adjuvant Herceptin every 3 weeks. Patient had echocardiogram on 4/2 at 60%.  She will proceed with Herceptin today.    2.  She is currently receiving radiation therapy. She will have her last treatment with them today.  She does have marked erythema underneath her axilla, she will talk to radiation oncology about this today.    3. We plan to see Ms. Piazza again in three weeks for her next Herceptin infusion and we will collect laboratories of the CMP and CBC prior to her infusion.   4.  The patient declines a compression garment for upper extremity lymphedema at this  time.  All questions were answered.  The patient was encouraged to contact us in the interim with any problems, questions or concerns.   I spent 25 minutes counseling the patient face to face.  The total time spent in the appointment was 30 minutes.  Cherie Ouch Lyn Hollingshead, NP Medical Oncology [facility] Phone: [phone] 06/17/2012, 9:15 AM

## 2012-06-17 NOTE — Patient Instructions (Addendum)
Doing well.  Labs look good.  Proceed with Herceptin.

## 2012-06-17 NOTE — Patient Instructions (Signed)
[facility] Discharge Instructions for Patients Receiving Chemotherapy  Today you received the following chemotherapy agents Herceptin To help prevent nausea and vomiting after your treatment, we encourage you to take your nausea medication as prescribed.  If you develop nausea and vomiting that is not controlled by your nausea medication, call the clinic. If it is after clinic hours your family physician or the after hours number for the clinic or go to the Emergency Department.   BELOW ARE SYMPTOMS THAT SHOULD BE REPORTED IMMEDIATELY:  *FEVER GREATER THAN 100.5 F  *CHILLS WITH OR WITHOUT FEVER  NAUSEA AND VOMITING THAT IS NOT CONTROLLED WITH YOUR NAUSEA MEDICATION  *UNUSUAL SHORTNESS OF BREATH  *UNUSUAL BRUISING OR BLEEDING  TENDERNESS IN MOUTH AND THROAT WITH OR WITHOUT PRESENCE OF ULCERS  *URINARY PROBLEMS  *BOWEL PROBLEMS  UNUSUAL RASH Items with * indicate a potential emergency and should be followed up as soon as possible.  One of the nurses will contact you 24 hours after your treatment. Please let the nurse know about any problems that you may have experienced. Feel free to call the clinic you have any questions or concerns. The clinic phone number is [phone].   I have been informed and understand all the instructions given to me. I know to contact the clinic, my physician, or go to the Emergency Department if any problems should occur. I do not have any questions at this time, but understand that I may call the clinic during office hours   should I have any questions or need assistance in obtaining follow up care.    __________________________________________  _____________  __________ Signature of Patient or Authorized Representative            Date                   Time    __________________________________________ Nurse's Signature    

## 2012-06-20 ENCOUNTER — Ambulatory Visit: Payer: BC Managed Care – PPO

## 2012-06-20 ENCOUNTER — Encounter: Payer: Self-pay | Admitting: Radiation Oncology

## 2012-06-20 ENCOUNTER — Ambulatory Visit
Admission: RE | Admit: 2012-06-20 | Discharge: 2012-06-20 | Disposition: A | Discharge status: Home / Self Care | Payer: BC Managed Care – PPO | Source: Ambulatory Visit | Attending: Radiation Oncology | Admitting: Radiation Oncology

## 2012-06-20 VITALS — BP 157/83 | HR 70 | Temp 98.8°F | Resp 20 | Wt 184.4 lb

## 2012-06-20 DIAGNOSIS — C50212 Malignant neoplasm of upper-inner quadrant of left female breast: Secondary | ICD-10-CM

## 2012-06-20 MED ORDER — RADIAPLEXRX EX GEL
Freq: Once | CUTANEOUS | Status: AC
  Administered 2012-06-20: 15:00:00 via TOPICAL

## 2012-06-20 NOTE — Progress Notes (Signed)
Weekly Management Note:  Site: Left chest wall scar boost Current Dose:  1000  cGy Projected Dose: 1000  cGy  Narrative: The patient is seen today for routine under treatment assessment. CBCT/MVCT images/port films were reviewed. The chart was reviewed.   She's been using Neosporin ointment for a moist desquamation along her lateral chest wall scar/axilla.   Physical Examination:  Filed Vitals:   06/20/12 1101  BP: 157/83  Pulse: 70  Temp: 98.8 F (37.1 C)  Resp: 20  .  Weight: 184 lb 6.4 oz (83.643 kg). There is erythema/hyperpigmentation the skin along her left chest wall/implant. There is moist desquamation laterally in a linear fashion along skin folds towards the axilla. There is patchy dry desquamation elsewhere.  Impression: Tolerating radiation therapy well with the expected degree radiation dermatitis.  Plan: Radiation therapy completed. Followup visit with Dr. Michell Heinrich in one month. She continues her antibody ointment for least a few more days.

## 2012-06-20 NOTE — Addendum Note (Signed)
Encounter addended by: Glennie Hawk, RN on: 06/20/2012  2:38 PM<BR>     Documentation filed: Inpatient MAR, Orders

## 2012-06-20 NOTE — Progress Notes (Signed)
Pt completed tx today to left breast/chest area. She has moist desquamation in left axilla, dry desquamation over tx area. She is applying Radiaplex, Cortisone cream prn for itching, Neosporin in axilla. She denies pain except skin discomfort in axilla for which she takes Oxycodone at night. She denies loss of appetite, fatigue. She has 1 month FU.

## 2012-06-22 NOTE — Progress Notes (Signed)
  Radiation Oncology         [phone] ________________________________  Name: Marie Anderson MRN: [id number]  Date: 06/20/2012  DOB: 1945-05-28  End of Treatment Note  Diagnosis: T2N1 Invasive ductal carcinoma of the left breast  Indication for treatment:  Curative       Radiation treatment dates:   05/04/2012-06/20/2012  Site/dose:    Left chest wall / 50.4 Gray @ 1.8 Wallace Cullens per fraction x 28 fractions Left Supraclavicular fossa / 45 Gray @1 .8 Gray per fraction x 25 fractions Left PAB / 45 Gy at 1.8 Gray per fraction x 25 fractions Left scar / 10 Gray at [employer] per fraction x 5 fractions  Beams/energy:   Opposed tangents / 6 MV photons RAO / 10 MV photons LPO / 6 MV photons En face / 6 MeV electrons  Narrative: The patient tolerated radiation treatment relatively well.   She had the expected dermatitis which was managed with radiaplex and biafene .  Plan: The patient has completed radiation treatment. The patient will return to radiation oncology clinic for routine followup in one month. I advised them to call or return sooner if they have any questions or concerns related to their recovery or treatment.  ------------------------------------------------  Lurline Hare, MD

## 2012-06-29 ENCOUNTER — Other Ambulatory Visit: Payer: Self-pay | Admitting: Dermatology

## 2012-06-30 ENCOUNTER — Ambulatory Visit (HOSPITAL_COMMUNITY)
Admission: RE | Admit: 2012-06-30 | Discharge: 2012-06-30 | Disposition: A | Discharge status: Home / Self Care | Payer: BC Managed Care – PPO | Source: Ambulatory Visit | Attending: Internal Medicine | Admitting: Internal Medicine

## 2012-06-30 VITALS — BP 140/80 | HR 68 | Wt 187.8 lb

## 2012-06-30 DIAGNOSIS — C50219 Malignant neoplasm of upper-inner quadrant of unspecified female breast: Secondary | ICD-10-CM

## 2012-06-30 DIAGNOSIS — C50212 Malignant neoplasm of upper-inner quadrant of left female breast: Secondary | ICD-10-CM

## 2012-06-30 NOTE — Progress Notes (Signed)
Patient ID: Marie Anderson, female   DOB: March 20, 1945, 67 y.o.   MRN: [id number] Oncologist: Dr Welton Flakes General Surgeon: Dr Dwain Sarna Plastic Surgeon: Dr Kelly Splinter Cardiologist: Dr  [place]  HPI:  67 year old with L breast cancer 12/2011  ER positive PR positive HER-2/neu positive,  s/p L Mastectomy, SVT (on metolprolol for 10 years last episode in 01/2012 received Adenosine) and no coronary disease. She is referred to Cardio-Onc clinic by Dr Welton Flakes.   She received neoadjuvant chemotherapy consisting of Perjeta, Herceptin, and Taxotere. Chemotherapy regimen was given every 21 days for a total of 4 cycles. Plan to continue Herceptin until 12/2012. She completed radiation  ECHO 12/25/11 EF 60% Lateral S' 9.8 ECHO 06/01/2012 EF 60% Lateral S' 9.7   She denies SOB/PND/Orthopnea/edema. 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   Review of Systems:     Cardiac Review of Systems: {Y] = yes [ ]  = no  Chest Pain [    ]  Resting SOB [   ] Exertional SOB  [  ]  Orthopnea [  ]   Pedal Edema [   ]    Palpitations [  ] Syncope  [  ]   Presyncope [   ]  General Review of Systems: [Y] = yes [  ]=no Constitional: recent weight change [  ]; anorexia [  ]; fatigue [ Y ]; nausea [  ]; night sweats [  ]; fever [  ]; or chills [  ];                                                                                                                                          Dental: poor dentition[  ]; Last Dentist visit:   Eye : blurred vision [  ]; diplopia [   ]; vision changes [  ];  Amaurosis fugax[  ]; Resp: cough [  ];  wheezing[  ];  hemoptysis[  ]; shortness of breath[  ]; paroxysmal nocturnal dyspnea[  ]; dyspnea on exertion[  ]; or orthopnea[  ];  GI:  gallstones[  ], vomiting[  ];  dysphagia[  ]; melena[  ];  hematochezia [  ]; heartburn[  ];   Hx of  Colonoscopy[  ]; GU: kidney stones [  ]; hematuria[  ];   dysuria [  ];  nocturia[  ];  history of     obstruction [  ];                 Skin: rash Y , swelling[   ];, hair loss[  Y];  peripheral edema[  ];  or itching[  ]; Musculosketetal: myalgias[  ];  joint swelling[  ];  joint erythema[  ];  joint pain[  ];  back pain[  ];  Heme/Lymph: bruising[  ];  bleeding[  ];  anemia[  ];  Neuro: TIA[  ];  headaches[  ];  stroke[  ];  vertigo[  ];  seizures[  ];   paresthesias[  ];  difficulty walking[  ];  Psych:depression[  ]; anxiety[  ];  Endocrine: diabetes[  ];  thyroid dysfunction[  ];  Immunizations: Flu [  ]; Pneumococcal[  ];  Other:    Past Medical History  Diagnosis Date  . Anemia   . SVT (supraventricular tachycardia)   . Breast cancer 12/04/11    left- inv ductal ca, DCIS, ER/PR +, HER 2 +  . History of chemotherapy 12/31/11 -03/11/12    s/p 4 cycles    Current Outpatient Prescriptions  Medication Sig Dispense Refill  . diazepam (VALIUM) 2 MG tablet Take 2 mg by mouth as needed.       . hyaluronate sodium (RADIAPLEXRX) GEL Apply topically 2 (two) times daily.      Marland Kitchen HYDROcodone-acetaminophen (NORCO/VICODIN) 5-325 MG per tablet       . lidocaine-prilocaine (EMLA) cream Apply topically as needed. Apply to port as directed  30 g  6  . metoprolol succinate (TOPROL-XL) 100 MG 24 hr tablet Take 1 tablet (100 mg total) by mouth daily.  90 tablet  3  . non-metallic deodorant (ALRA) MISC Apply 1 application topically daily as needed.      . ondansetron (ZOFRAN) 4 MG tablet Take 4 mg by mouth every 8 (eight) hours as needed.       Marland Kitchen oxyCODONE-acetaminophen (ROXICET) 5-325 MG per tablet Take 1 tablet by mouth every 4 (four) hours as needed for pain.  30 tablet  0   No current facility-administered medications for this encounter.     Allergies  Allergen Reactions  . Cephalosporins     headache  . Codeine Nausea Only    History   Social History  . Marital Status: Single    Spouse Name: N/A    Number of Children: 2  . Years of Education: N/A   Occupational History  . probation officer     retired  . front office      [employer]   Social History Main Topics  . Smoking status: Never Smoker   . Smokeless tobacco: Never Used  . Alcohol Use: Yes  . Drug Use: No  . Sexually Active: Yes   Other Topics Concern  . Not on file   Social History Narrative  . No narrative on file    Family History  Problem Relation Age of Onset  . Stroke Mother   . Cancer Father     prostate    PHYSICAL EXAM: Filed Vitals:   06/30/12 1043  BP: 140/80  Pulse: 68   General:  Well appearing. No respiratory difficulty HEENT: normal Neck: supple. no JVD. Carotids 2+ bilat; no bruits. No lymphadenopathy or thryomegaly appreciated. Cor: PMI nondisplaced. Regular rate & rhythm. No rubs, gallops or murmurs. Lungs: clear Abdomen: soft, nontender, nondistended. No hepatosplenomegaly. No bruits or masses. Good bowel sounds. Extremities: no cyanosis, clubbing, rash, edema Neuro: alert & oriented x 3, cranial nerves grossly intact. moves all 4 extremities w/o difficulty. Affect pleasant.    No results found for this or any previous visit (from the past 24 hour(s)). No results found.   ASSESSMENT & PLAN:

## 2012-06-30 NOTE — Assessment & Plan Note (Addendum)
Explained the purpose of the Cardi-Onc Clinic as it relates to breast cancer treatment with Herceptin. Discussed that there is ~10% chance of cardiotoxicity. Dr Gala Romney reviewed and discussed ECHO results. EF and lateral S' stable. Follow up in 3 months with an ECHO.   Patient seen and examined with Tonye Becket, NP. We discussed all aspects of the encounter. I agree with the assessment and plan as stated above.  Explained incidence of Herceptin cardiotoxicity and role of Cardio-oncology clinic at length. Echo images reviewed personally. All parameters stable. Reviewed signs and symptoms of HF to look for. Continue Herceptin. Follow-up with echo in 3 months.

## 2012-06-30 NOTE — Patient Instructions (Addendum)
Follow up in 3 months with an ECHO and Dr Bensimhon 

## 2012-07-04 ENCOUNTER — Telehealth: Payer: Self-pay | Admitting: *Deleted

## 2012-07-04 NOTE — Telephone Encounter (Signed)
Spoke w/pt re: skin check. She states her skin "is healing, is no longer red, and is peeling but dry". She is applying Radiaplex lotion twice daily. Confirmed pt's FU appt w/Dr Michell Heinrich 07/21/12. Pt states she has FU w/Dr Sanger this week.

## 2012-07-08 ENCOUNTER — Encounter: Payer: Self-pay | Admitting: Adult Health

## 2012-07-08 ENCOUNTER — Other Ambulatory Visit (HOSPITAL_BASED_OUTPATIENT_CLINIC_OR_DEPARTMENT_OTHER): Payer: BC Managed Care – PPO | Admitting: Lab

## 2012-07-08 ENCOUNTER — Ambulatory Visit (HOSPITAL_BASED_OUTPATIENT_CLINIC_OR_DEPARTMENT_OTHER): Payer: BC Managed Care – PPO

## 2012-07-08 ENCOUNTER — Ambulatory Visit (HOSPITAL_BASED_OUTPATIENT_CLINIC_OR_DEPARTMENT_OTHER): Payer: BC Managed Care – PPO | Admitting: Adult Health

## 2012-07-08 VITALS — BP 146/87 | HR 78 | Temp 98.2°F | Resp 20 | Ht 65.0 in | Wt 185.8 lb

## 2012-07-08 DIAGNOSIS — C50919 Malignant neoplasm of unspecified site of unspecified female breast: Secondary | ICD-10-CM

## 2012-07-08 DIAGNOSIS — C50219 Malignant neoplasm of upper-inner quadrant of unspecified female breast: Secondary | ICD-10-CM

## 2012-07-08 DIAGNOSIS — Z170 Estrogen receptor positive status [ER+]: Secondary | ICD-10-CM

## 2012-07-08 DIAGNOSIS — C50212 Malignant neoplasm of upper-inner quadrant of left female breast: Secondary | ICD-10-CM

## 2012-07-08 DIAGNOSIS — I890 Lymphedema, not elsewhere classified: Secondary | ICD-10-CM

## 2012-07-08 DIAGNOSIS — Z5112 Encounter for antineoplastic immunotherapy: Secondary | ICD-10-CM

## 2012-07-08 LAB — COMPREHENSIVE METABOLIC PANEL (CC13)
BUN: 14.5 mg/dL (ref 7.0–26.0)
CO2: 25 mEq/L (ref 22–29)
Calcium: 8.6 mg/dL (ref 8.4–10.4)
Chloride: 109 mEq/L — ABNORMAL HIGH (ref 98–107)
Creatinine: 0.7 mg/dL (ref 0.6–1.1)
Total Bilirubin: 0.44 mg/dL (ref 0.20–1.20)

## 2012-07-08 LAB — CBC WITH DIFFERENTIAL/PLATELET
BASO%: 0.6 % (ref 0.0–2.0)
EOS%: 3.6 % (ref 0.0–7.0)
HCT: 36.4 % (ref 34.8–46.6)
LYMPH%: 25 % (ref 14.0–49.7)
MCH: 30.8 pg (ref 25.1–34.0)
MCHC: 33.2 g/dL (ref 31.5–36.0)
MCV: 92.6 fL (ref 79.5–101.0)
MONO#: 0.2 10*3/uL (ref 0.1–0.9)
MONO%: 13.7 % (ref 0.0–14.0)
NEUT%: 57.1 % (ref 38.4–76.8)
Platelets: 76 10*3/uL — ABNORMAL LOW (ref 145–400)
RBC: 3.93 10*6/uL (ref 3.70–5.45)

## 2012-07-08 MED ORDER — DIPHENHYDRAMINE HCL 25 MG PO CAPS: 50.0000 mg | ORAL_CAPSULE | Freq: Once | ORAL | Status: DC

## 2012-07-08 MED ORDER — HEPARIN SOD (PORK) LOCK FLUSH 100 UNIT/ML IV SOLN
500.0000 [IU] | Freq: Once | INTRAVENOUS | Status: AC | PRN
  Administered 2012-07-08: 500 [IU]
  Filled 2012-07-08: qty 5

## 2012-07-08 MED ORDER — SODIUM CHLORIDE 0.9 % IV SOLN
Freq: Once | INTRAVENOUS | Status: AC
  Administered 2012-07-08: 13:00:00 via INTRAVENOUS

## 2012-07-08 MED ORDER — TRASTUZUMAB CHEMO INJECTION 440 MG
6.0000 mg/kg | Freq: Once | INTRAVENOUS | Status: AC
  Administered 2012-07-08: 504 mg via INTRAVENOUS
  Filled 2012-07-08: qty 24

## 2012-07-08 MED ORDER — SODIUM CHLORIDE 0.9 % IJ SOLN
10.0000 mL | INTRAMUSCULAR | Status: DC | PRN
Start: 2012-07-08 — End: 2012-07-08
  Administered 2012-07-08: 10 mL
  Filled 2012-07-08: qty 10

## 2012-07-08 MED ORDER — ACETAMINOPHEN 325 MG PO TABS: 650.0000 mg | ORAL_TABLET | Freq: Once | ORAL | Status: DC

## 2012-07-08 NOTE — Patient Instructions (Signed)
Doing well.  Proceed with Herceptin.  Please call us if you have any questions or concerns.    

## 2012-07-08 NOTE — Patient Instructions (Addendum)
[facility] Discharge Instructions for Patients Receiving Chemotherapy  Today you received the following chemotherapy agents Herceptin.  To help prevent nausea and vomiting after your treatment, we encourage you to take your nausea medication as prescribed.    If you develop nausea and vomiting that is not controlled by your nausea medication, call the clinic. If it is after clinic hours your family physician or the after hours number for the clinic or go to the Emergency Department.   BELOW ARE SYMPTOMS THAT SHOULD BE REPORTED IMMEDIATELY:  *FEVER GREATER THAN 100.5 F  *CHILLS WITH OR WITHOUT FEVER  NAUSEA AND VOMITING THAT IS NOT CONTROLLED WITH YOUR NAUSEA MEDICATION  *UNUSUAL SHORTNESS OF BREATH  *UNUSUAL BRUISING OR BLEEDING  TENDERNESS IN MOUTH AND THROAT WITH OR WITHOUT PRESENCE OF ULCERS  *URINARY PROBLEMS  *BOWEL PROBLEMS  UNUSUAL RASH Items with * indicate a potential emergency and should be followed up as soon as possible.   Please let the nurse know about any problems that you may have experienced. Feel free to call the clinic you have any questions or concerns. The clinic phone number is [phone].   I have been informed and understand all the instructions given to me. I know to contact the clinic, my physician, or go to the Emergency Department if any problems should occur. I do not have any questions at this time, but understand that I may call the clinic during office hours   should I have any questions or need assistance in obtaining follow up care.    __________________________________________  _____________  __________ Signature of Patient or Authorized Representative            Date                   Time    __________________________________________ Nurse's Signature    

## 2012-07-08 NOTE — Progress Notes (Signed)
Per Mardella Layman, OK to treat today despite counts.

## 2012-07-08 NOTE — Progress Notes (Signed)
[facility]  Telephone:(336) [phone] Fax:([phone]  OFFICE PROGRESS NOTE  PATIENT: Marie Anderson   DOB: [birth year]  MR#: [id number]  OZD#:[id number]  QV:[id number],FIEP S, MD Dr. Sigmund Hazel  Dr. Emelia Loron  Dr. Lurline Hare  DIAGNOSIS:  67 year old [name], [name] with invasive mammary carcinoma of the left breast ER positive PR positive HER-2/neu positive with Ki-67 94% diagnosed in 12/2011.   PRIOR THERAPY: 1.  The patient was originally seen in the Multidisciplinary Breast Clinic on 12/16/2011. She was diagnosed with stage II a HER-2 positive ER/PR positive breast cancer of the left breast at the 3:00 position. She also had a second area that was ER/PR positive but HER-2/neu negative. Patient is interested in breast conservation.   2.  The patient is now status post 4 cycles of Herceptin progenitor and Taxotere given every 21 days administered from 12/31/2011 to 03/11/2012.   3.  The patient had MRI of the breasts performed on 03/07/2012 that revealed good response to neoadjuvant treatment. She was referred to Dr. Emelia Loron for definitive surgery.   4.  The patient underwent a left mastectomy and sentinel lymph node biopsy on 04/06/2012. This showed a 0.8 cm grade 1 invasive ductal carcinoma with associated low-grade ductal carcinoma in situ. One sentinel lymph node had a micrometastatic focus of ductal carcinoma. Treatment effect was noted within the primary lesion however nests of ductal carcinoma involving the lymphatics were still present. The lymph node shows no evidence of tumor effect. The margins were widely negative. Her tumor was estrogen and progesterone receptor 100% positive. She did have immediate reconstruction with a expander placed at the time of her mastectomy.  5.Radiation therapy from 05/04/12 to 06/20/12.  6. Every three week Herceptin beginning 04/15/12.  We did hold one dose on 05/27/12.     CURRENT THERAPY:   Radiation therapy currently underway with the patient scheduled for 35 treatments.  Herceptin infusions are being administered every 3 weeks.   INTERVAL HISTORY: Ms. Nhem returns for follow up today prior to her adjuvant Herceptin for her breast cancer.  She is undergoing radiation therapy and had an appointment with Dr Gala Romney on 06/30/12 who reviewed her echo and cleared her to continue with Herceptin therapy.  She has an expander in place, and future fills have been put on hold until her skin has time to heal from the radiation.  She is doing well and a 10 point ROS is neg with the exception of fatigue.    PAST MEDICAL HISTORY: Past Medical History  Diagnosis Date  . Anemia   . SVT (supraventricular tachycardia)   . Breast cancer 12/04/11    left- inv ductal ca, DCIS, ER/PR +, HER 2 +  . History of chemotherapy 12/31/11 -03/11/12    s/p 4 cycles    PAST SURGICAL HISTORY: Past Surgical History  Procedure Laterality Date  . Dilation and curettage of uterus    . Eye surgery      age 67-lt eye growth  . Colonoscopy    . Portacath placement  12/23/2011    Procedure: INSERTION PORT-A-CATH;  Surgeon: Emelia Loron, MD;  Location: [facility];  Service: General;  Laterality: Right;  . Mastectomy w/ sentinel node biopsy  04/06/2012    Procedure: MASTECTOMY WITH SENTINEL LYMPH NODE BIOPSY;  Surgeon: Emelia Loron, MD;  Location: [facility];  Service: General;  Laterality: Left;  left mastectomy, left axillary sentinel node biopsy  . Breast reconstruction with placement of  tissue expander and flex hd (acellular hydrated dermis)  04/06/2012    Procedure: BREAST RECONSTRUCTION WITH PLACEMENT OF TISSUE EXPANDER AND FLEX HD (ACELLULAR HYDRATED DERMIS);  Surgeon: Wayland Denis, DO;  Location: [facility];  Service: Plastics;  Laterality: Left;  IMMEDIATE LEFT BREAST RECONSTRUCTION WITH PLACEMENT OF TISSUE EXPANDER AND ALLODERM     FAMILY  HISTORY: Family History  Problem Relation Age of Onset  . Stroke Mother   . Cancer Father     prostate    SOCIAL HISTORY: History  Substance Use Topics  . Smoking status: Never Smoker   . Smokeless tobacco: Never Used  . Alcohol Use: Yes    ALLERGIES: Allergies  Allergen Reactions  . Cephalosporins     headache  . Codeine Nausea Only     MEDICATIONS:  Current Outpatient Prescriptions  Medication Sig Dispense Refill  . diazepam (VALIUM) 2 MG tablet Take 2 mg by mouth as needed.       . hyaluronate sodium (RADIAPLEXRX) GEL Apply topically 2 (two) times daily.      Marland Kitchen HYDROcodone-acetaminophen (NORCO/VICODIN) 5-325 MG per tablet       . lidocaine-prilocaine (EMLA) cream Apply topically as needed. Apply to port as directed  30 g  6  . metoprolol succinate (TOPROL-XL) 100 MG 24 hr tablet Take 1 tablet (100 mg total) by mouth daily.  90 tablet  3  . non-metallic deodorant (ALRA) MISC Apply 1 application topically daily as needed.       No current facility-administered medications for this visit.      REVIEW OF SYSTEMS: A 10 point review of systems was completed and is negative except as noted above.    PHYSICAL EXAMINATION: BP 146/87  Pulse 78  Temp(Src) 98.2 F (36.8 C) (Oral)  Resp 20  Ht 5\' 5"  (1.651 m)  Wt 185 lb 12.8 oz (84.278 kg)  BMI 30.92 kg/m2   General appearance: Alert, cooperative, well nourished, no apparent distress Head: Normocephalic, without obvious abnormality, the patient is wearing a hat Eyes: Conjunctivae/corneas clear, PERRLA, EOMI Nose: Nares, septum and mucosa are normal, no drainage or sinus tenderness Neck: No adenopathy, supple, symmetrical, trachea midline, thyroid not enlarged, no tenderness Resp: Clear to auscultation bilaterally Cardio: Regular rate and rhythm, S1, S2 normal, no murmur, click, rub or gallop Breasts: Left breast has new surgical scars and is still in the process of healing, expander present, erythema to left chest  wall, particularly medially and under axilla, no purulent discharge, no warmth or swelling (patient numb in area so unable to evaluate for tenderness), right breast no nipple inversion, no axilla fullness, benign breast exam GI: Soft, distended, non-tender, hypoactive bowel sounds, no organomegaly Extremities: Extremities normal, atraumatic, no cyanosis, left upper extremity lymphedema Lymph nodes: Cervical, supraclavicular, and axillary nodes normal Neurologic: Grossly normal    ECOG FS:  Grade 1 - Symptomatic but completely ambulatory   LAB RESULTS: Lab Results  Component Value Date   WBC 1.7* 07/08/2012   NEUTROABS 1.0* 07/08/2012   HGB 12.1 07/08/2012   HCT 36.4 07/08/2012   MCV 92.6 07/08/2012   PLT 76* 07/08/2012      Chemistry      Component Value Date/Time   NA 142 07/08/2012 1114   NA 143 03/31/2012 1200   K 4.0 07/08/2012 1114   K 3.7 03/31/2012 1200   CL 109* 07/08/2012 1114   CL 107 03/31/2012 1200   CO2 25 07/08/2012 1114   CO2 29 03/31/2012 1200   BUN  14.5 07/08/2012 1114   BUN 7 03/31/2012 1200   CREATININE 0.7 07/08/2012 1114   CREATININE 0.67 03/31/2012 1200      Component Value Date/Time   CALCIUM 8.6 07/08/2012 1114   CALCIUM 8.6 03/31/2012 1200   ALKPHOS 80 07/08/2012 1114   AST 19 07/08/2012 1114   ALT 15 07/08/2012 1114   BILITOT 0.44 07/08/2012 1114       Lab Results  Component Value Date   LABCA2 70* 12/16/2011    No components found with this basename: [id number]     RADIOGRAPHIC STUDIES: No results found.  ASSESSMENT: 67 y.o. with: 1.  Stage II invasive mammary carcinoma of the left breast that is ER positive PR positive HER-2/neu positive with Ki-67 94%. Patient was interested in breast conservation. She received neoadjuvant chemotherapy consisting of Perjeta, Herceptin, and Taxotere. Chemotherapy regimen was given every 21 days for a total of 4 cycles.  MRI of the breasts on 03/07/2012 showed a good interval response to neoadjuvant chemotherapy.  The patient completed  all of her therapy on 03/04/2012.   2.  The patient is now status post left mastectomy with sentinel lymph node biopsy on 04/06/2012. She had a 0.8 cm grade 1 invasive ductal carcinoma with associated low-grade ductal carcinoma in situ. One sentinel node micrometastatic focus of ductal carcinoma. Treatment effect was noted within the primary lesion however nests of ductal carcinoma involving the lymphatics were still present. Margins were negative tumor was ER/PR +100%. She is status post immediate reconstruction with expanders placed at the time of her mastectomy.   3.  The patient completed radiation therapy under the care of Dr. Michell Heinrich on 06/20/12. She will also continue Herceptin infusions every 3 weeks for 1 year.  4. Left upper extremity lymphedema  PLAN:  1.  The patient is continuing adjuvant Herceptin every 3 weeks. Patient had echocardiogram on 4/2 at 60%.  She also saw Dr. Gala Romney on 06/30/12.  She will proceed with Herceptin today.    2.  She has completed radiation therapy, and her skin is continuing to heal.      3. We plan to see Ms. Murton again in three weeks for her next Herceptin infusion and we will collect laboratories of the CMP and CBC prior to her infusion.   4.  The patient continues to decline a compression garment for upper extremity lymphedema at this time.  All questions were answered.  The patient was encouraged to contact us in the interim with any problems, questions or concerns.   I spent 25 minutes counseling the patient face to face.  The total time spent in the appointment was 30 minutes.  Cherie Ouch Lyn Hollingshead, NP Medical Oncology [facility] Phone: [phone] 07/10/2012, 7:41 AM

## 2012-07-15 ENCOUNTER — Encounter: Payer: Self-pay | Admitting: Radiation Oncology

## 2012-07-21 ENCOUNTER — Ambulatory Visit
Admission: RE | Admit: 2012-07-21 | Discharge: 2012-07-21 | Disposition: A | Discharge status: Home / Self Care | Payer: BC Managed Care – PPO | Source: Ambulatory Visit | Attending: Radiation Oncology | Admitting: Radiation Oncology

## 2012-07-21 ENCOUNTER — Encounter: Payer: Self-pay | Admitting: Radiation Oncology

## 2012-07-21 VITALS — BP 166/89 | HR 72 | Temp 98.3°F | Resp 20 | Wt 186.1 lb

## 2012-07-21 DIAGNOSIS — C50212 Malignant neoplasm of upper-inner quadrant of left female breast: Secondary | ICD-10-CM

## 2012-07-21 HISTORY — DX: Personal history of irradiation: Z92.3

## 2012-07-21 NOTE — Progress Notes (Signed)
   Department of Radiation Oncology  Phone:  [phone] Fax:        [phone]   Name: Marie Anderson MRN: [id number]  DOB: [birth year]  Date: 07/21/2012  Follow Up Visit Note  Diagnosis: T2N1 Left breast cancer  Summary and Interval since last radiation: 1 month from 60.4 Gy  Interval History: Marie Anderson presents today for routine followup.  Seeing Welton Flakes next Friday. Not on anti estrogen. Would like to treat toenail fungus now that she has no toenails if possible. Exapnder for another 6 weeks then exchange for permanent implant per [employer].   Allergies:  Allergies  Allergen Reactions  . Cephalosporins     headache  . Codeine Nausea Only    Medications:  Current Outpatient Prescriptions  Medication Sig Dispense Refill  . diazepam (VALIUM) 2 MG tablet Take 2 mg by mouth as needed.       . hyaluronate sodium (RADIAPLEXRX) GEL Apply topically 2 (two) times daily.      Marland Kitchen HYDROcodone-acetaminophen (NORCO/VICODIN) 5-325 MG per tablet       . lidocaine-prilocaine (EMLA) cream Apply topically as needed. Apply to port as directed  30 g  6  . metoprolol succinate (TOPROL-XL) 100 MG 24 hr tablet Take 1 tablet (100 mg total) by mouth daily.  90 tablet  3  . non-metallic deodorant (ALRA) MISC Apply 1 application topically daily as needed.      Marland Kitchen oxyCODONE-acetaminophen (PERCOCET/ROXICET) 5-325 MG per tablet Take 1 tablet by mouth as needed. 5/325mg        No current facility-administered medications for this encounter.    Physical Exam:  Filed Vitals:   07/21/12 1430  BP: 166/89  Pulse: 72  Temp: 98.3 F (36.8 C)  Resp: 20   Skin well healed over expander.   IMPRESSION: Marie Anderson is a 67 y.o. female s/p radiation   PLAN:  Follow up prn. Continue lotion prn. Follow up with medical oncology and plastic surgery.    Lurline Hare, MD

## 2012-07-21 NOTE — Progress Notes (Signed)
Patient here f/u s/p rad tx left breast:05/04/12-06/20/12, , Alert,oriented x3, no c/o pain, some discomfort under axilla  From the expander placed in Feb 2014 states patient, well healed under axilla, breast has some erythema still with flaky peeling, dry, no other pain, good appetite, medium on energy level, drinking better, last Herceptin 07/08/12, next due 07/29/12 2:35 PM

## 2012-07-22 ENCOUNTER — Encounter: Payer: Self-pay | Admitting: Radiation Oncology

## 2012-07-22 NOTE — Progress Notes (Signed)
Patient stopped by requesting itemized bill from 06/17/12 to end of treatment be faxed to [facility].  Faxed for her to [phone] - claim [phone]

## 2012-07-29 ENCOUNTER — Ambulatory Visit (HOSPITAL_BASED_OUTPATIENT_CLINIC_OR_DEPARTMENT_OTHER): Payer: BC Managed Care – PPO | Admitting: Adult Health

## 2012-07-29 ENCOUNTER — Other Ambulatory Visit: Payer: Self-pay | Admitting: *Deleted

## 2012-07-29 ENCOUNTER — Encounter: Payer: Self-pay | Admitting: Adult Health

## 2012-07-29 ENCOUNTER — Other Ambulatory Visit (HOSPITAL_BASED_OUTPATIENT_CLINIC_OR_DEPARTMENT_OTHER): Payer: BC Managed Care – PPO | Admitting: Lab

## 2012-07-29 ENCOUNTER — Ambulatory Visit (HOSPITAL_BASED_OUTPATIENT_CLINIC_OR_DEPARTMENT_OTHER): Payer: BC Managed Care – PPO

## 2012-07-29 VITALS — BP 132/85 | HR 77 | Temp 98.6°F | Resp 20 | Ht 65.0 in | Wt 185.9 lb

## 2012-07-29 DIAGNOSIS — C50219 Malignant neoplasm of upper-inner quadrant of unspecified female breast: Secondary | ICD-10-CM

## 2012-07-29 DIAGNOSIS — C50212 Malignant neoplasm of upper-inner quadrant of left female breast: Secondary | ICD-10-CM

## 2012-07-29 DIAGNOSIS — C50919 Malignant neoplasm of unspecified site of unspecified female breast: Secondary | ICD-10-CM

## 2012-07-29 DIAGNOSIS — I890 Lymphedema, not elsewhere classified: Secondary | ICD-10-CM

## 2012-07-29 DIAGNOSIS — Z170 Estrogen receptor positive status [ER+]: Secondary | ICD-10-CM

## 2012-07-29 DIAGNOSIS — Z5112 Encounter for antineoplastic immunotherapy: Secondary | ICD-10-CM

## 2012-07-29 LAB — COMPREHENSIVE METABOLIC PANEL (CC13)
Albumin: 3.6 g/dL (ref 3.5–5.0)
Alkaline Phosphatase: 80 U/L (ref 40–150)
BUN: 13.7 mg/dL (ref 7.0–26.0)
Calcium: 9 mg/dL (ref 8.4–10.4)
Glucose: 82 mg/dl (ref 70–99)
Potassium: 3.9 mEq/L (ref 3.5–5.1)

## 2012-07-29 LAB — CBC WITH DIFFERENTIAL/PLATELET
BASO%: 0 % (ref 0.0–2.0)
Basophils Absolute: 0 10*3/uL (ref 0.0–0.1)
Eosinophils Absolute: 0.1 10*3/uL (ref 0.0–0.5)
HCT: 37.4 % (ref 34.8–46.6)
HGB: 12.3 g/dL (ref 11.6–15.9)
MONO#: 0.2 10*3/uL (ref 0.1–0.9)
NEUT#: 1.2 10*3/uL — ABNORMAL LOW (ref 1.5–6.5)
NEUT%: 59.3 % (ref 38.4–76.8)
WBC: 1.9 10*3/uL — ABNORMAL LOW (ref 3.9–10.3)
lymph#: 0.5 10*3/uL — ABNORMAL LOW (ref 0.9–3.3)

## 2012-07-29 MED ORDER — ANASTROZOLE 1 MG PO TABS: 1.0000 mg | ORAL_TABLET | Freq: Every day | ORAL | Status: DC

## 2012-07-29 MED ORDER — DIPHENHYDRAMINE HCL 25 MG PO CAPS: 50.0000 mg | ORAL_CAPSULE | Freq: Once | ORAL | Status: DC

## 2012-07-29 MED ORDER — HEPARIN SOD (PORK) LOCK FLUSH 100 UNIT/ML IV SOLN
500.0000 [IU] | Freq: Once | INTRAVENOUS | Status: AC | PRN
  Administered 2012-07-29: 500 [IU]
  Filled 2012-07-29: qty 5

## 2012-07-29 MED ORDER — TRASTUZUMAB CHEMO INJECTION 440 MG
6.0000 mg/kg | Freq: Once | INTRAVENOUS | Status: AC
  Administered 2012-07-29: 504 mg via INTRAVENOUS
  Filled 2012-07-29: qty 24

## 2012-07-29 MED ORDER — SODIUM CHLORIDE 0.9 % IJ SOLN
10.0000 mL | INTRAMUSCULAR | Status: DC | PRN
  Administered 2012-07-29: 10 mL
  Filled 2012-07-29: qty 10

## 2012-07-29 MED ORDER — ACETAMINOPHEN 325 MG PO TABS: 650.0000 mg | ORAL_TABLET | Freq: Once | ORAL | Status: DC

## 2012-07-29 MED ORDER — SODIUM CHLORIDE 0.9 % IV SOLN: Freq: Once | INTRAVENOUS | Status: DC

## 2012-07-29 NOTE — Patient Instructions (Addendum)
[facility] Discharge Instructions for Patients Receiving Chemotherapy  Today you received the following chemotherapy agents Herceptin.  To help prevent nausea and vomiting after your treatment, we encourage you to take your nausea medication as prescribed.   If you develop nausea and vomiting that is not controlled by your nausea medication, call the clinic. If it is after clinic hours your family physician or the after hours number for the clinic or go to the Emergency Department.   BELOW ARE SYMPTOMS THAT SHOULD BE REPORTED IMMEDIATELY:  *FEVER GREATER THAN 100.5 F  *CHILLS WITH OR WITHOUT FEVER  NAUSEA AND VOMITING THAT IS NOT CONTROLLED WITH YOUR NAUSEA MEDICATION  *UNUSUAL SHORTNESS OF BREATH  *UNUSUAL BRUISING OR BLEEDING  TENDERNESS IN MOUTH AND THROAT WITH OR WITHOUT PRESENCE OF ULCERS  *URINARY PROBLEMS  *BOWEL PROBLEMS  UNUSUAL RASH Items with * indicate a potential emergency and should be followed up as soon as possible.  Feel free to call the clinic you have any questions or concerns. The clinic phone number is [phone].   I have been informed and understand all the instructions given to me. I know to contact the clinic, my physician, or go to the Emergency Department if any problems should occur. I do not have any questions at this time, but understand that I may call the clinic during office hours   should I have any questions or need assistance in obtaining follow up care.    __________________________________________  _____________  __________ Signature of Patient or Authorized Representative            Date                   Time    __________________________________________ Nurse's Signature    

## 2012-07-29 NOTE — Progress Notes (Addendum)
[facility]  Telephone:(336) [phone] Fax:(336) [phone]  OFFICE PROGRESS NOTE  PATIENT: Marie Anderson   DOB: Jul 30, 1945  MR#: [id number]  JYN#:[id number]  QM:VHQION,GEXB S, MD Dr. Sigmund Hazel  Dr. Emelia Loron  Dr. Lurline Hare  DIAGNOSIS:  67 year old [place], [name] with invasive mammary carcinoma of the left breast ER positive PR positive HER-2/neu positive with Ki-67 94% diagnosed in 12/2011.   PRIOR THERAPY: 1.  The patient was originally seen in the Multidisciplinary Breast Clinic on 12/16/2011. She was diagnosed with stage II a HER-2 positive ER/PR positive breast cancer of the left breast at the 3:00 position. She also had a second area that was ER/PR positive but HER-2/neu negative. Patient is interested in breast conservation.   2.  The patient is now status post 4 cycles of Herceptin progenitor and Taxotere given every 21 days administered from 12/31/2011 to 03/11/2012.   3.  The patient had MRI of the breasts performed on 03/07/2012 that revealed good response to neoadjuvant treatment. She was referred to Dr. Emelia Loron for definitive surgery.   4.  The patient underwent a left mastectomy and sentinel lymph node biopsy on 04/06/2012. This showed a 0.8 cm grade 1 invasive ductal carcinoma with associated low-grade ductal carcinoma in situ. One sentinel lymph node had a micrometastatic focus of ductal carcinoma. Treatment effect was noted within the primary lesion however nests of ductal carcinoma involving the lymphatics were still present. The lymph node shows no evidence of tumor effect. The margins were widely negative. Her tumor was estrogen and progesterone receptor 100% positive. She did have immediate reconstruction with a expander placed at the time of her mastectomy.  5.Radiation therapy from 05/04/12 to 06/20/12.  6. Every three week Herceptin beginning 04/15/12.  We did hold one dose on 05/27/12.    7. Arimidex starting  07/29/12.   CURRENT THERAPY:  Herceptin infusions are being administered every 3 weeks.   INTERVAL HISTORY: [name] returns for follow up today prior to her adjuvant Herceptin for her breast cancer.  She is doing well today.  She denies fevers, chills, nausea, vomiting, swelling, shortness of breath, DOE, PND, orthopnea.  A 10 point ROS is neg.   PAST MEDICAL HISTORY: Past Medical History  Diagnosis Date  . Anemia   . SVT (supraventricular tachycardia)   . Breast cancer 12/04/11    left- inv ductal ca, DCIS, ER/PR +, HER 2 +  . History of chemotherapy 12/31/11 -03/11/12    s/p 4 cycles  . History of radiation therapy 05/04/12-06/20/12    left breast/    PAST SURGICAL HISTORY: Past Surgical History  Procedure Laterality Date  . Dilation and curettage of uterus    . Eye surgery      age 30-lt eye growth  . Colonoscopy    . Portacath placement  12/23/2011    Procedure: INSERTION PORT-A-CATH;  Surgeon: Emelia Loron, MD;  Location: [facility];  Service: General;  Laterality: Right;  . Mastectomy w/ sentinel node biopsy  04/06/2012    Procedure: MASTECTOMY WITH SENTINEL LYMPH NODE BIOPSY;  Surgeon: Emelia Loron, MD;  Location: [facility];  Service: General;  Laterality: Left;  left mastectomy, left axillary sentinel node biopsy  . Breast reconstruction with placement of tissue expander and flex hd (acellular hydrated dermis)  04/06/2012    Procedure: BREAST RECONSTRUCTION WITH PLACEMENT OF TISSUE EXPANDER AND FLEX HD (ACELLULAR HYDRATED DERMIS);  Surgeon: Wayland Denis, DO;  Location: [facility];  Service: Government social research officer;  Laterality: Left;  IMMEDIATE LEFT BREAST RECONSTRUCTION WITH PLACEMENT OF TISSUE EXPANDER AND ALLODERM     FAMILY HISTORY: Family History  Problem Relation Age of Onset  . Stroke Mother   . Cancer Father     prostate    SOCIAL HISTORY: History  Substance Use Topics  . Smoking status: Never Smoker   . Smokeless  tobacco: Never Used  . Alcohol Use: Yes    ALLERGIES: Allergies  Allergen Reactions  . Cephalosporins     headache  . Codeine Nausea Only     MEDICATIONS:  Current Outpatient Prescriptions  Medication Sig Dispense Refill  . diazepam (VALIUM) 2 MG tablet Take 2 mg by mouth as needed.       . hyaluronate sodium (RADIAPLEXRX) GEL Apply topically 2 (two) times daily.      Marland Kitchen HYDROcodone-acetaminophen (NORCO/VICODIN) 5-325 MG per tablet       . lidocaine-prilocaine (EMLA) cream Apply topically as needed. Apply to port as directed  30 g  6  . metoprolol succinate (TOPROL-XL) 100 MG 24 hr tablet Take 1 tablet (100 mg total) by mouth daily.  90 tablet  3  . non-metallic deodorant (ALRA) MISC Apply 1 application topically daily as needed.      Marland Kitchen oxyCODONE-acetaminophen (PERCOCET/ROXICET) 5-325 MG per tablet Take 1 tablet by mouth as needed. 5/325mg        No current facility-administered medications for this visit.      REVIEW OF SYSTEMS: A 10 point review of systems was completed and is negative except as noted above.    PHYSICAL EXAMINATION: BP 132/85  Pulse 77  Temp(Src) 98.6 F (37 C) (Oral)  Resp 20  Ht 5\' 5"  (1.651 m)  Wt 185 lb 14.4 oz (84.324 kg)  BMI 30.94 kg/m2   General appearance: Alert, cooperative, well nourished, no apparent distress Head: Normocephalic, without obvious abnormality, the patient is wearing a hat Eyes: Conjunctivae/corneas clear, PERRLA, EOMI Nose: Nares, septum and mucosa are normal, no drainage or sinus tenderness Neck: No adenopathy, supple, symmetrical, trachea midline, thyroid not enlarged, no tenderness Resp: Clear to auscultation bilaterally Cardio: Regular rate and rhythm, S1, S2 normal, no murmur, click, rub or gallop Breasts: Left breast has new surgical scars and is still in the process of healing, expander present, erythema to left chest wall, particularly medially and under axilla, no purulent discharge, no warmth or swelling (patient  numb in area so unable to evaluate for tenderness), right breast no nipple inversion, no axilla fullness, benign breast exam GI: Soft, distended, non-tender, hypoactive bowel sounds, no organomegaly Extremities: Extremities normal, atraumatic, no cyanosis, left upper extremity lymphedema Lymph nodes: Cervical, supraclavicular, and axillary nodes normal Neurologic: Grossly normal  ECOG FS:  Grade 1 - Symptomatic but completely ambulatory   LAB RESULTS: Lab Results  Component Value Date   WBC 1.9* 07/29/2012   NEUTROABS 1.2* 07/29/2012   HGB 12.3 07/29/2012   HCT 37.4 07/29/2012   MCV 93.0 07/29/2012   PLT 78* 07/29/2012      Chemistry      Component Value Date/Time   NA 142 07/08/2012 1114   NA 143 03/31/2012 1200   K 4.0 07/08/2012 1114   K 3.7 03/31/2012 1200   CL 109* 07/08/2012 1114   CL 107 03/31/2012 1200   CO2 25 07/08/2012 1114   CO2 29 03/31/2012 1200   BUN 14.5 07/08/2012 1114   BUN 7 03/31/2012 1200   CREATININE 0.7 07/08/2012 1114   CREATININE 0.67 03/31/2012 1200  Component Value Date/Time   CALCIUM 8.6 07/08/2012 1114   CALCIUM 8.6 03/31/2012 1200   ALKPHOS 80 07/08/2012 1114   AST 19 07/08/2012 1114   ALT 15 07/08/2012 1114   BILITOT 0.44 07/08/2012 1114       Lab Results  Component Value Date   LABCA2 70* 12/16/2011    No components found with this basename: [id number]     RADIOGRAPHIC STUDIES: No results found.  ASSESSMENT: 67 y.o. with: 1.  Stage II invasive mammary carcinoma of the left breast that is ER positive PR positive HER-2/neu positive with Ki-67 94%. Patient was interested in breast conservation. She received neoadjuvant chemotherapy consisting of Perjeta, Herceptin, and Taxotere. Chemotherapy regimen was given every 21 days for a total of 4 cycles.  MRI of the breasts on 03/07/2012 showed a good interval response to neoadjuvant chemotherapy.  The patient completed all of her therapy on 03/04/2012.   2.  The patient is now status post left mastectomy with  sentinel lymph node biopsy on 04/06/2012. She had a 0.8 cm grade 1 invasive ductal carcinoma with associated low-grade ductal carcinoma in situ. One sentinel node micrometastatic focus of ductal carcinoma. Treatment effect was noted within the primary lesion however nests of ductal carcinoma involving the lymphatics were still present. Margins were negative tumor was ER/PR +100%. She is status post immediate reconstruction with expanders placed at the time of her mastectomy.   3.  The patient completed radiation therapy under the care of Dr. Michell Heinrich on 06/20/12. She will also continue Herceptin infusions every 3 weeks for 1 year.  4. Left upper extremity lymphedema  PLAN:  1. She is doing well and will proceed with adjuvant Herceptin.  Patient had echocardiogram on 4/2 at 60%.  She also saw Dr. Gala Romney on 06/30/12.  She will start Arimidex daily.  I reviewed the adverse effects with her in detail, and she will bring [place] a copy of her latest bone density at her next appointment.    2.  She has completed radiation therapy.  Doing well.    3. We plan to see Ms. Fussner again in three weeks for her next Herceptin infusion and we will collect laboratories of the CMP and CBC prior to her infusion.   4.  The patient continues to decline a compression garment for upper extremity lymphedema at this time.  All questions were answered.  The patient was encouraged to contact us in the interim with any problems, questions or concerns.   I spent 25 minutes counseling the patient face to face.  The total time spent in the appointment was 30 minutes.  Cherie Ouch Lyn Hollingshead, NP Medical Oncology [facility] Phone: [phone] 07/29/2012, 11:44 AM

## 2012-07-29 NOTE — Patient Instructions (Addendum)
Doing well.  Proceed with Herceptin.  Please call us if you have any questions or concerns.    

## 2012-07-29 NOTE — Addendum Note (Signed)
Addended by: Augustin Schooling C on: 07/29/2012 01:49 PM   Modules accepted: Orders

## 2012-08-17 ENCOUNTER — Other Ambulatory Visit: Payer: Self-pay | Admitting: Dermatology

## 2012-08-19 ENCOUNTER — Ambulatory Visit (HOSPITAL_BASED_OUTPATIENT_CLINIC_OR_DEPARTMENT_OTHER): Payer: BC Managed Care – PPO

## 2012-08-19 ENCOUNTER — Ambulatory Visit (HOSPITAL_BASED_OUTPATIENT_CLINIC_OR_DEPARTMENT_OTHER): Payer: BC Managed Care – PPO | Admitting: Adult Health

## 2012-08-19 ENCOUNTER — Other Ambulatory Visit (HOSPITAL_BASED_OUTPATIENT_CLINIC_OR_DEPARTMENT_OTHER): Payer: BC Managed Care – PPO | Admitting: Lab

## 2012-08-19 ENCOUNTER — Encounter: Payer: Self-pay | Admitting: Adult Health

## 2012-08-19 VITALS — BP 150/67 | HR 67 | Temp 98.3°F | Resp 20 | Ht 65.0 in | Wt 188.1 lb

## 2012-08-19 DIAGNOSIS — C50219 Malignant neoplasm of upper-inner quadrant of unspecified female breast: Secondary | ICD-10-CM

## 2012-08-19 DIAGNOSIS — C50919 Malignant neoplasm of unspecified site of unspecified female breast: Secondary | ICD-10-CM

## 2012-08-19 DIAGNOSIS — C50212 Malignant neoplasm of upper-inner quadrant of left female breast: Secondary | ICD-10-CM

## 2012-08-19 DIAGNOSIS — Z170 Estrogen receptor positive status [ER+]: Secondary | ICD-10-CM

## 2012-08-19 DIAGNOSIS — Z5112 Encounter for antineoplastic immunotherapy: Secondary | ICD-10-CM

## 2012-08-19 DIAGNOSIS — I890 Lymphedema, not elsewhere classified: Secondary | ICD-10-CM

## 2012-08-19 LAB — COMPREHENSIVE METABOLIC PANEL (CC13)
ALT: 16 U/L (ref 0–55)
AST: 18 U/L (ref 5–34)
Albumin: 3.7 g/dL (ref 3.5–5.0)
Calcium: 8.9 mg/dL (ref 8.4–10.4)
Chloride: 109 mEq/L — ABNORMAL HIGH (ref 98–107)
Creatinine: 0.7 mg/dL (ref 0.6–1.1)
Potassium: 3.8 mEq/L (ref 3.5–5.1)

## 2012-08-19 LAB — CBC WITH DIFFERENTIAL/PLATELET
BASO%: 0.4 % (ref 0.0–2.0)
MCHC: 32.7 g/dL (ref 31.5–36.0)
MONO#: 0.4 10*3/uL (ref 0.1–0.9)
RBC: 3.73 10*6/uL (ref 3.70–5.45)
WBC: 2.5 10*3/uL — ABNORMAL LOW (ref 3.9–10.3)
lymph#: 0.7 10*3/uL — ABNORMAL LOW (ref 0.9–3.3)
nRBC: 0 % (ref 0–0)

## 2012-08-19 MED ORDER — SODIUM CHLORIDE 0.9 % IJ SOLN
10.0000 mL | INTRAMUSCULAR | Status: DC | PRN
  Administered 2012-08-19: 10 mL via INTRAVENOUS
  Filled 2012-08-19: qty 10

## 2012-08-19 MED ORDER — TRASTUZUMAB CHEMO INJECTION 440 MG
6.0000 mg/kg | Freq: Once | INTRAVENOUS | Status: AC
  Administered 2012-08-19: 504 mg via INTRAVENOUS
  Filled 2012-08-19: qty 24

## 2012-08-19 MED ORDER — HEPARIN SOD (PORK) LOCK FLUSH 100 UNIT/ML IV SOLN
500.0000 [IU] | Freq: Once | INTRAVENOUS | Status: AC
  Administered 2012-08-19: 500 [IU] via INTRAVENOUS
  Filled 2012-08-19: qty 5

## 2012-08-19 MED ORDER — SODIUM CHLORIDE 0.9 % IV SOLN
Freq: Once | INTRAVENOUS | Status: AC
  Administered 2012-08-19: 12:00:00 via INTRAVENOUS

## 2012-08-19 NOTE — Patient Instructions (Addendum)
[facility] Discharge Instructions for Patients Receiving Chemotherapy  Today you received the following chemotherapy agents:  herceptin  To help prevent nausea and vomiting after your treatment, we encourage you to take your nausea medication.  If you develop nausea and vomiting that is not controlled by your nausea medication, call the clinic.   BELOW ARE SYMPTOMS THAT SHOULD BE REPORTED IMMEDIATELY:  *FEVER GREATER THAN 100.5 F  *CHILLS WITH OR WITHOUT FEVER  NAUSEA AND VOMITING THAT IS NOT CONTROLLED WITH YOUR NAUSEA MEDICATION  *UNUSUAL SHORTNESS OF BREATH  *UNUSUAL BRUISING OR BLEEDING  TENDERNESS IN MOUTH AND THROAT WITH OR WITHOUT PRESENCE OF ULCERS  *URINARY PROBLEMS  *BOWEL PROBLEMS  UNUSUAL RASH Items with * indicate a potential emergency and should be followed up as soon as possible.  Feel free to call the clinic you have any questions or concerns. The clinic phone number is [phone].    

## 2012-08-19 NOTE — Progress Notes (Signed)
[facility]  [phone] [phone]  OFFICE PROGRESS NOTE  PATIENT: Marie Anderson   DOB: June 02, 1945  MR#: [id number]  JYN#:[id number]  QM:VHQION,GEXB S, MD Dr. Sigmund Hazel  Dr. Emelia Loron  Dr. Lurline Hare  DIAGNOSIS:  67 year old Marie Anderson [place] woman with invasive mammary carcinoma of the left breast ER positive PR positive HER-2/neu positive with Ki-67 94% diagnosed in 12/2011.   PRIOR THERAPY: 1.  The patient was originally seen in the Multidisciplinary Breast Clinic on 12/16/2011. She was diagnosed with stage II a HER-2 positive ER/PR positive breast cancer of the left breast at the 3:00 position. She also had a second area that was ER/PR positive but HER-2/neu negative. Patient is interested in breast conservation.   2.  The patient is now status post 4 cycles of Herceptin progenitor and Taxotere given every 21 days administered from 12/31/2011 to 03/11/2012.   3.  The patient had MRI of the breasts performed on 03/07/2012 that revealed good response to neoadjuvant treatment. She was referred to Dr. Emelia Loron for definitive surgery.   4.  The patient underwent a left mastectomy and sentinel lymph node biopsy on 04/06/2012. This showed a 0.8 cm grade 1 invasive ductal carcinoma with associated low-grade ductal carcinoma in situ. One sentinel lymph node had a micrometastatic focus of ductal carcinoma. Treatment effect was noted within the primary lesion however nests of ductal carcinoma involving the lymphatics were still present. The lymph node shows no evidence of tumor effect. The margins were widely negative. Her tumor was estrogen and progesterone receptor 100% positive. She did have immediate reconstruction with a expander placed at the time of her mastectomy.  5.Radiation therapy from 05/04/12 to 06/20/12.  6. Every three week Herceptin beginning 04/15/12.  We did hold one dose on 05/27/12.    7. Arimidex starting  07/29/12.   CURRENT THERAPY:  Every 3 week herceptin and daily Arimidex.   INTERVAL HISTORY: [name] returns for follow up today prior to her adjuvant Herceptin for her breast cancer.  She is doing well today.  She is tolerating Arimidex daily without any difficulty.  She denies hot flashes, vaginal dryness, joint aches.  She forgot to bring me a copy of her most recent bone density.  She also denies fevers, chills, nausea, vomiting, constipation, numbness, diarrhea, swelling, orthopnea, DOE, night sweats, pain, cough, PND, chest pain, palpitations, or any other concerns.  A 10 point ROS is neg.   PAST MEDICAL HISTORY: Past Medical History  Diagnosis Date  . Anemia   . SVT (supraventricular tachycardia)   . Breast cancer 12/04/11    left- inv ductal ca, DCIS, ER/PR +, HER 2 +  . History of chemotherapy 12/31/11 -03/11/12    s/p 4 cycles  . History of radiation therapy 05/04/12-06/20/12    left breast/    PAST SURGICAL HISTORY: Past Surgical History  Procedure Laterality Date  . Dilation and curettage of uterus    . Eye surgery      age 12-lt eye growth  . Colonoscopy    . Portacath placement  12/23/2011    Procedure: INSERTION PORT-A-CATH;  Surgeon: Emelia Loron, MD;  Location: [facility];  Service: General;  Laterality: Right;  . Mastectomy w/ sentinel node biopsy  04/06/2012    Procedure: MASTECTOMY WITH SENTINEL LYMPH NODE BIOPSY;  Surgeon: Emelia Loron, MD;  Location: [facility];  Service: General;  Laterality: Left;  left mastectomy, left axillary sentinel node biopsy  .  Breast reconstruction with placement of tissue expander and flex hd (acellular hydrated dermis)  04/06/2012    Procedure: BREAST RECONSTRUCTION WITH PLACEMENT OF TISSUE EXPANDER AND FLEX HD (ACELLULAR HYDRATED DERMIS);  Surgeon: Wayland Denis, DO;  Location: [facility];  Service: Plastics;  Laterality: Left;  IMMEDIATE LEFT BREAST RECONSTRUCTION WITH PLACEMENT OF  TISSUE EXPANDER AND ALLODERM     FAMILY HISTORY: Family History  Problem Relation Age of Onset  . Stroke Mother   . Cancer Father     prostate    SOCIAL HISTORY: History  Substance Use Topics  . Smoking status: Never Smoker   . Smokeless tobacco: Never Used  . Alcohol Use: Yes    ALLERGIES: Allergies  Allergen Reactions  . Cephalosporins     headache  . Codeine Nausea Only     MEDICATIONS:  Current Outpatient Prescriptions  Medication Sig Dispense Refill  . anastrozole (ARIMIDEX) 1 MG tablet Take 1 tablet (1 mg total) by mouth daily.  30 tablet  6  . diazepam (VALIUM) 2 MG tablet Take 2 mg by mouth as needed.       . hyaluronate sodium (RADIAPLEXRX) GEL Apply topically 2 (two) times daily.      Marland Kitchen HYDROcodone-acetaminophen (NORCO/VICODIN) 5-325 MG per tablet       . lidocaine-prilocaine (EMLA) cream Apply topically as needed. Apply to port as directed  30 g  6  . metoprolol succinate (TOPROL-XL) 100 MG 24 hr tablet Take 1 tablet (100 mg total) by mouth daily.  90 tablet  3  . non-metallic deodorant (ALRA) MISC Apply 1 application topically daily as needed.      Marland Kitchen oxyCODONE-acetaminophen (PERCOCET/ROXICET) 5-325 MG per tablet Take 1 tablet by mouth as needed. 5/325mg        No current facility-administered medications for this visit.      REVIEW OF SYSTEMS: A 10 point review of systems was completed and is negative except as noted above.    PHYSICAL EXAMINATION: BP 150/67  Pulse 67  Temp(Src) 98.3 F (36.8 C) (Oral)  Resp 20  Ht 5\' 5"  (1.651 m)  Wt 188 lb 1.6 oz (85.322 kg)  BMI 31.3 kg/m2  General appearance: Alert, cooperative, well nourished, no apparent distress Head: Normocephalic, without obvious abnormality, the patient is wearing a hat Eyes: Conjunctivae/corneas clear, PERRLA, EOMI Nose: Nares, septum and mucosa are normal, no drainage or sinus tenderness Neck: No adenopathy, supple, symmetrical, trachea midline, thyroid not enlarged, no  tenderness Resp: Clear to auscultation bilaterally Cardio: Regular rate and rhythm, S1, S2 normal, no murmur, click, rub or gallop Breasts: Left breast expander present, no purulent discharge, no warmth or swelling, right breast no nipple inversion, no axilla fullness, benign breast exam GI: Soft, distended, non-tender, hypoactive bowel sounds, no organomegaly Extremities: Extremities normal, atraumatic, no cyanosis, left upper extremity lymphedema Lymph nodes: Cervical, supraclavicular, and axillary nodes normal Neurologic: Grossly normal  Skin: no rash or lesion.  Bandage on left deltoid, due to recent skin lesion removal by dermatology.  ECOG FS:  Grade 1 - Symptomatic but completely ambulatory   LAB RESULTS: Lab Results  Component Value Date   WBC 2.5* 08/19/2012   NEUTROABS 1.4* 08/19/2012   HGB 11.5* 08/19/2012   HCT 35.2 08/19/2012   MCV 94.4 08/19/2012   PLT 73* 08/19/2012      Chemistry      Component Value Date/Time   NA 142 07/29/2012 1116   NA 143 03/31/2012 1200   K 3.9 07/29/2012 1116   K  3.7 03/31/2012 1200   CL 108* 07/29/2012 1116   CL 107 03/31/2012 1200   CO2 27 07/29/2012 1116   CO2 29 03/31/2012 1200   BUN 13.7 07/29/2012 1116   BUN 7 03/31/2012 1200   CREATININE 0.7 07/29/2012 1116   CREATININE 0.67 03/31/2012 1200      Component Value Date/Time   CALCIUM 9.0 07/29/2012 1116   CALCIUM 8.6 03/31/2012 1200   ALKPHOS 80 07/29/2012 1116   AST 20 07/29/2012 1116   ALT 17 07/29/2012 1116   BILITOT 0.63 07/29/2012 1116       Lab Results  Component Value Date   LABCA2 70* 12/16/2011    No components found with this basename: [id number]     RADIOGRAPHIC STUDIES: No results found.  ASSESSMENT: 67 y.o. with: 1.  Stage II invasive mammary carcinoma of the left breast that is ER positive PR positive HER-2/neu positive with Ki-67 94%. Patient was interested in breast conservation. She received neoadjuvant chemotherapy consisting of Perjeta, Herceptin, and Taxotere.  Chemotherapy regimen was given every 21 days for a total of 4 cycles.  MRI of the breasts on 03/07/2012 showed a good interval response to neoadjuvant chemotherapy.  The patient completed all of her therapy on 03/04/2012.   2.  The patient is now status post left mastectomy with sentinel lymph node biopsy on 04/06/2012. She had a 0.8 cm grade 1 invasive ductal carcinoma with associated low-grade ductal carcinoma in situ. One sentinel node micrometastatic focus of ductal carcinoma. Treatment effect was noted within the primary lesion however nests of ductal carcinoma involving the lymphatics were still present. Margins were negative tumor was ER/PR +100%. She is status post immediate reconstruction with expanders placed at the time of her mastectomy.   3.  The patient completed radiation therapy under the care of Dr. Michell Heinrich on 06/20/12. She will also continue Herceptin infusions every 3 weeks for 1 year.  4. Left upper extremity lymphedema  PLAN:  1. She is doing well and will proceed with adjuvant Herceptin.  Patient had echocardiogram on 4/2 at 60%.  She also saw Dr. Gala Romney on 06/30/12.  Continue daily arimidex.  I reminded her to bring in her bone density report at her next appt.   2.  She has completed radiation therapy.  Doing well.    3. We plan to see Ms. Collard again in three weeks for her next Herceptin infusion and we will collect laboratories of the CMP and CBC prior to her infusion. I reviewed her labs with Dr. Welton Flakes today, and we will not pursue any further work up at this time.   4.  The patient continues to decline a compression garment for upper extremity lymphedema at this time.  All questions were answered.  The patient was encouraged to contact us in the interim with any problems, questions or concerns.   I spent 25 minutes counseling the patient face to face.  The total time spent in the appointment was 30 minutes.  Cherie Ouch Lyn Hollingshead, NP Medical Oncology [facility] Phone: [phone] 08/19/2012, 10:48 AM

## 2012-08-19 NOTE — Progress Notes (Signed)
Patient refused tylenol and benadryl. She states she has not taken them as premeds for a long time and has had no problems with the herceptin.

## 2012-08-19 NOTE — Patient Instructions (Signed)
Doing well.  Proceed with Herceptin.  Please bring in a copy of your bone density report.  Continue daily Arimidex.  Please call us if you have any questions or concerns.

## 2012-08-30 ENCOUNTER — Telehealth (INDEPENDENT_AMBULATORY_CARE_PROVIDER_SITE_OTHER): Payer: Self-pay | Admitting: *Deleted

## 2012-08-30 NOTE — Telephone Encounter (Signed)
Patient called to ask about her PAC and it itching.  Patient states her last chemo infusion was 6/20 but the itching had started just prior to that assess.  Patient states she has tired Vitamin E, cortisone cream, and ice to help.  Patient states the itching wakes her up at night.  Patient denies trying Benadryl.  Patient states the only thing that gives her some relief if Lidocaine cream that she has but it only lasts for a short time.  Patient asking for suggestions of what she can do for the itching.

## 2012-08-30 NOTE — Telephone Encounter (Signed)
Returned pt's call. I explained to the pt that she is doing everything we would advise her to do with the itching at the [facility] site. I advised pt that Dr Dwain Sarna said she would need to be seen to evaluate the site. The pt has an appt with Dr Dwain Sarna on 10/25/12 so she said if this area gets worse she will call to move that appt up earlier if needed.

## 2012-08-31 ENCOUNTER — Ambulatory Visit (INDEPENDENT_AMBULATORY_CARE_PROVIDER_SITE_OTHER): Payer: Medicare Other | Admitting: General Surgery

## 2012-08-31 ENCOUNTER — Encounter (INDEPENDENT_AMBULATORY_CARE_PROVIDER_SITE_OTHER): Payer: Self-pay | Admitting: General Surgery

## 2012-08-31 DIAGNOSIS — R21 Rash and other nonspecific skin eruption: Secondary | ICD-10-CM

## 2012-08-31 NOTE — Progress Notes (Signed)
Subjective:     Patient ID: Marie Anderson, female   DOB: 26-Jul-1945, 67 y.o.   MRN: [id number]  HPI History of left breast cancer and currently on Herceptin (started in October).  She had a port placed then as well and has been doing well with her treatment and her port until May. She said at that time she began having some redness around her wound but it hasn't been causing any discomfort and she has not had any fevers or chills. She's been trying some topical medications without significant relief. She has been putting vitamin E on the wound also without any relief  Review of Systems     Objective:   Physical Exam She has an erythematous rash for about 10 cm in diameter around her right subclavian Mediport. There is no fluctuance and the wound does not appear to be cellulitic. There is no significant tenderness.    Assessment:     Skin rash I think that this is most likely a dermatitis and rash and less likely a port infection considering the chronicity of her symptoms. She does not have any fevers or chills or significant discomfort associated with this but is really just isolated to her rash.  I have recommended that she stop all of the other topical treatments in continue with the topical 0.1% hydrocortisone cream for about 7 days applied 2-3 times per day. We will see her back in about one week to see if this has made any change to her rash. Certainly if there is any increase in the rash or discomfort that she will cause back sooner. This could also be chronic infection and foreign body reaction to her port but I think that this is less likely given the fact that this has been going on for 2 months     Plan:     Topical hydrocortisone and Benadryl when necessary Follow up in 7 days for repeat evaluation

## 2012-09-08 ENCOUNTER — Other Ambulatory Visit: Payer: Self-pay | Admitting: Medical Oncology

## 2012-09-08 DIAGNOSIS — C50912 Malignant neoplasm of unspecified site of left female breast: Secondary | ICD-10-CM

## 2012-09-09 ENCOUNTER — Ambulatory Visit (HOSPITAL_BASED_OUTPATIENT_CLINIC_OR_DEPARTMENT_OTHER): Payer: BC Managed Care – PPO

## 2012-09-09 ENCOUNTER — Telehealth (INDEPENDENT_AMBULATORY_CARE_PROVIDER_SITE_OTHER): Payer: Self-pay

## 2012-09-09 ENCOUNTER — Telehealth: Payer: Self-pay | Admitting: Oncology

## 2012-09-09 ENCOUNTER — Encounter: Payer: Self-pay | Admitting: Adult Health

## 2012-09-09 ENCOUNTER — Other Ambulatory Visit (HOSPITAL_BASED_OUTPATIENT_CLINIC_OR_DEPARTMENT_OTHER): Payer: BC Managed Care – PPO | Admitting: Lab

## 2012-09-09 ENCOUNTER — Ambulatory Visit (HOSPITAL_BASED_OUTPATIENT_CLINIC_OR_DEPARTMENT_OTHER): Payer: Medicare Other | Admitting: Adult Health

## 2012-09-09 VITALS — BP 139/83 | HR 88 | Temp 97.3°F | Resp 20 | Ht 65.0 in | Wt 200.2 lb

## 2012-09-09 DIAGNOSIS — C50219 Malignant neoplasm of upper-inner quadrant of unspecified female breast: Secondary | ICD-10-CM

## 2012-09-09 DIAGNOSIS — IMO0002 Reserved for concepts with insufficient information to code with codable children: Secondary | ICD-10-CM

## 2012-09-09 DIAGNOSIS — C50919 Malignant neoplasm of unspecified site of unspecified female breast: Secondary | ICD-10-CM

## 2012-09-09 DIAGNOSIS — I890 Lymphedema, not elsewhere classified: Secondary | ICD-10-CM

## 2012-09-09 DIAGNOSIS — C50212 Malignant neoplasm of upper-inner quadrant of left female breast: Secondary | ICD-10-CM

## 2012-09-09 DIAGNOSIS — Z170 Estrogen receptor positive status [ER+]: Secondary | ICD-10-CM

## 2012-09-09 DIAGNOSIS — C50912 Malignant neoplasm of unspecified site of left female breast: Secondary | ICD-10-CM

## 2012-09-09 DIAGNOSIS — Z5112 Encounter for antineoplastic immunotherapy: Secondary | ICD-10-CM

## 2012-09-09 LAB — COMPREHENSIVE METABOLIC PANEL (CC13)
ALT: 18 U/L (ref 0–55)
AST: 21 U/L (ref 5–34)
Alkaline Phosphatase: 71 U/L (ref 40–150)
BUN: 16.5 mg/dL (ref 7.0–26.0)
Chloride: 109 mEq/L (ref 98–109)
Creatinine: 0.7 mg/dL (ref 0.6–1.1)
Total Bilirubin: 0.5 mg/dL (ref 0.20–1.20)

## 2012-09-09 LAB — CBC WITH DIFFERENTIAL/PLATELET
BASO%: 1.1 % (ref 0.0–2.0)
EOS%: 3 % (ref 0.0–7.0)
HCT: 37.4 % (ref 34.8–46.6)
LYMPH%: 29.1 % (ref 14.0–49.7)
MCH: 31.2 pg (ref 25.1–34.0)
MCHC: 32.9 g/dL (ref 31.5–36.0)
MCV: 94.9 fL (ref 79.5–101.0)
MONO%: 11.9 % (ref 0.0–14.0)
NEUT%: 54.9 % (ref 38.4–76.8)
lymph#: 0.8 10*3/uL — ABNORMAL LOW (ref 0.9–3.3)

## 2012-09-09 MED ORDER — TRASTUZUMAB CHEMO INJECTION 440 MG
6.0000 mg/kg | Freq: Once | INTRAVENOUS | Status: AC
  Administered 2012-09-09: 504 mg via INTRAVENOUS
  Filled 2012-09-09: qty 24

## 2012-09-09 MED ORDER — SODIUM CHLORIDE 0.9 % IV SOLN
Freq: Once | INTRAVENOUS | Status: AC
  Administered 2012-09-09: 13:00:00 via INTRAVENOUS

## 2012-09-09 MED ORDER — SODIUM CHLORIDE 0.9 % IJ SOLN
10.0000 mL | INTRAMUSCULAR | Status: DC | PRN
  Administered 2012-09-09: 10 mL
  Filled 2012-09-09: qty 10

## 2012-09-09 MED ORDER — HEPARIN SOD (PORK) LOCK FLUSH 100 UNIT/ML IV SOLN
500.0000 [IU] | Freq: Once | INTRAVENOUS | Status: AC | PRN
  Administered 2012-09-09: 500 [IU]
  Filled 2012-09-09: qty 5

## 2012-09-09 NOTE — Patient Instructions (Addendum)
Doing well.  Continue Arimidex.  Proceed with Herceptin.  If your itching continues, or worsens, stop the Arimidex immediately and call us.  We will schedule f/u with Dr. Gala Romney.  Please call us if you have any questions or concerns.

## 2012-09-09 NOTE — Patient Instructions (Addendum)
[facility] Discharge Instructions for Patients Receiving Chemotherapy  Today you received the following chemotherapy agents Herceptin.  To help prevent nausea and vomiting after your treatment, we encourage you to take your nausea medication as prescribed.   If you develop nausea and vomiting that is not controlled by your nausea medication, call the clinic.   BELOW ARE SYMPTOMS THAT SHOULD BE REPORTED IMMEDIATELY:  *FEVER GREATER THAN 100.5 F  *CHILLS WITH OR WITHOUT FEVER  NAUSEA AND VOMITING THAT IS NOT CONTROLLED WITH YOUR NAUSEA MEDICATION  *UNUSUAL SHORTNESS OF BREATH  *UNUSUAL BRUISING OR BLEEDING  TENDERNESS IN MOUTH AND THROAT WITH OR WITHOUT PRESENCE OF ULCERS  *URINARY PROBLEMS  *BOWEL PROBLEMS  UNUSUAL RASH Items with * indicate a potential emergency and should be followed up as soon as possible.  Feel free to call the clinic you have any questions or concerns. The clinic phone number is [phone].    

## 2012-09-09 NOTE — Telephone Encounter (Signed)
LMOM for pt notifying her that I needed to r/s her appt from 7/14 to 7/15 per  Dr Doreen Salvage request b/c he had to add a surgery on that day.

## 2012-09-09 NOTE — Telephone Encounter (Signed)
Pt returned my call and she is good with the appt time on 7/15.

## 2012-09-09 NOTE — Progress Notes (Signed)
[facility]  Telephone:(336) [phone] Fax:(336) [phone]  OFFICE PROGRESS NOTE  PATIENT: Marie Anderson   DOB: 18-Nov-1945  MR#: [id number]  JYN#:[id number]  QM:VHQION,GEXB S, MD Dr. Sigmund Hazel  Dr. Emelia Loron  Dr. Lurline Hare  DIAGNOSIS:  67 year old [place], [name] with invasive mammary carcinoma of the left breast ER positive PR positive HER-2/neu positive with Ki-67 94% diagnosed in 12/2011.   PRIOR THERAPY: 1.  The patient was originally seen in the Multidisciplinary Breast Clinic on 12/16/2011. She was diagnosed with stage II a HER-2 positive ER/PR positive breast cancer of the left breast at the 3:00 position. She also had a second area that was ER/PR positive but HER-2/neu negative. Patient is interested in breast conservation.   2.  The patient is now status post 4 cycles of Herceptin progenitor and Taxotere given every 21 days administered from 12/31/2011 to 03/11/2012.   3.  The patient had MRI of the breasts performed on 03/07/2012 that revealed good response to neoadjuvant treatment. She was referred to Dr. Emelia Loron for definitive surgery.   4.  The patient underwent a left mastectomy and sentinel lymph node biopsy on 04/06/2012. This showed a 0.8 cm grade 1 invasive ductal carcinoma with associated low-grade ductal carcinoma in situ. One sentinel lymph node had a micrometastatic focus of ductal carcinoma. Treatment effect was noted within the primary lesion however nests of ductal carcinoma involving the lymphatics were still present. The lymph node shows no evidence of tumor effect. The margins were widely negative. Her tumor was estrogen and progesterone receptor 100% positive. She did have immediate reconstruction with a expander placed at the time of her mastectomy.  5.Radiation therapy from 05/04/12 to 06/20/12.  6. Every three week Herceptin beginning 04/15/12.  We did hold one dose on 05/27/12.    7. Arimidex starting  07/29/12.   CURRENT THERAPY:  Every 3 week herceptin and daily Arimidex.   INTERVAL HISTORY: Ms. Sypher returns for follow up today prior to her adjuvant Herceptin for her breast cancer.  She continues to take Arimidex daily and has recently noticed itching on her legs.  She is otherwise feeling well and denies fevers, chills, nausea, vomiting, constipation, diarrhea, orthopnea, PND, DOE or any further concerns.  A 10 point ROS is otherwise negative.   PAST MEDICAL HISTORY: Past Medical History  Diagnosis Date  . Anemia   . SVT (supraventricular tachycardia)   . Breast cancer 12/04/11    left- inv ductal ca, DCIS, ER/PR +, HER 2 +  . History of chemotherapy 12/31/11 -03/11/12    s/p 4 cycles  . History of radiation therapy 05/04/12-06/20/12    left breast/    PAST SURGICAL HISTORY: Past Surgical History  Procedure Laterality Date  . Dilation and curettage of uterus    . Eye surgery      age 67-lt eye growth  . Colonoscopy    . Portacath placement  12/23/2011    Procedure: INSERTION PORT-A-CATH;  Surgeon: Emelia Loron, MD;  Location: [facility];  Service: General;  Laterality: Right;  . Mastectomy w/ sentinel node biopsy  04/06/2012    Procedure: MASTECTOMY WITH SENTINEL LYMPH NODE BIOPSY;  Surgeon: Emelia Loron, MD;  Location: [facility];  Service: General;  Laterality: Left;  left mastectomy, left axillary sentinel node biopsy  . Breast reconstruction with placement of tissue expander and flex hd (acellular hydrated dermis)  04/06/2012    Procedure: BREAST RECONSTRUCTION WITH PLACEMENT OF TISSUE EXPANDER AND  FLEX HD (ACELLULAR HYDRATED DERMIS);  Surgeon: Wayland Denis, DO;  Location: [facility];  Service: Plastics;  Laterality: Left;  IMMEDIATE LEFT BREAST RECONSTRUCTION WITH PLACEMENT OF TISSUE EXPANDER AND ALLODERM     FAMILY HISTORY: Family History  Problem Relation Age of Onset  . Stroke Mother   . Cancer Father     prostate     SOCIAL HISTORY: History  Substance Use Topics  . Smoking status: Never Smoker   . Smokeless tobacco: Never Used  . Alcohol Use: Yes    ALLERGIES: Allergies  Allergen Reactions  . Cephalosporins     headache  . Codeine Nausea Only     MEDICATIONS:  Current Outpatient Prescriptions  Medication Sig Dispense Refill  . anastrozole (ARIMIDEX) 1 MG tablet Take 1 tablet (1 mg total) by mouth daily.  30 tablet  6  . diazepam (VALIUM) 2 MG tablet Take 2 mg by mouth as needed.       . hyaluronate sodium (RADIAPLEXRX) GEL Apply topically 2 (two) times daily.      Marland Kitchen HYDROcodone-acetaminophen (NORCO/VICODIN) 5-325 MG per tablet       . lidocaine-prilocaine (EMLA) cream Apply topically as needed. Apply to port as directed  30 g  6  . metoprolol succinate (TOPROL-XL) 100 MG 24 hr tablet Take 1 tablet (100 mg total) by mouth daily.  90 tablet  3  . non-metallic deodorant (ALRA) MISC Apply 1 application topically daily as needed.      Marland Kitchen oxyCODONE-acetaminophen (PERCOCET/ROXICET) 5-325 MG per tablet Take 1 tablet by mouth as needed. 5/325mg        No current facility-administered medications for this visit.      REVIEW OF SYSTEMS: A 10 point review of systems was completed and is negative except as noted above.    PHYSICAL EXAMINATION: BP 139/83  Pulse 88  Temp(Src) 97.3 F (36.3 C) (Oral)  Resp 20  Ht 5\' 5"  (1.651 m)  Wt 200 lb 3.2 oz (90.81 kg)  BMI 33.31 kg/m2  General appearance: Alert, cooperative, well nourished, no apparent distress Head: Normocephalic, without obvious abnormality, the patient is wearing a hat Eyes: Conjunctivae/corneas clear, PERRLA, EOMI Nose: Nares, septum and mucosa are normal, no drainage or sinus tenderness Neck: No adenopathy, supple, symmetrical, trachea midline, thyroid not enlarged, no tenderness Resp: Clear to auscultation bilaterally Cardio: Regular rate and rhythm, S1, S2 normal, no murmur, click, rub or gallop Breasts: Left breast expander  present, no purulent discharge, no warmth or swelling, right breast no nipple inversion, no axilla fullness, benign breast exam GI: Soft, distended, non-tender, hypoactive bowel sounds, no organomegaly Extremities: Extremities normal, atraumatic, no cyanosis, left upper extremity lymphedema Lymph nodes: Cervical, supraclavicular, and axillary nodes normal Neurologic: Grossly normal  Skin: no rash or lesion.  Bandage on left deltoid, due to recent skin lesion removal by dermatology.  ECOG FS:  Grade 1 - Symptomatic but completely ambulatory   LAB RESULTS: Lab Results  Component Value Date   WBC 2.7* 09/09/2012   NEUTROABS 1.5 09/09/2012   HGB 12.3 09/09/2012   HCT 37.4 09/09/2012   MCV 94.9 09/09/2012   PLT 75* 09/09/2012      Chemistry      Component Value Date/Time   NA 143 08/19/2012 1021   NA 143 03/31/2012 1200   K 3.8 08/19/2012 1021   K 3.7 03/31/2012 1200   CL 109* 08/19/2012 1021   CL 107 03/31/2012 1200   CO2 25 08/19/2012 1021   CO2 29 03/31/2012 1200  BUN 15.0 08/19/2012 1021   BUN 7 03/31/2012 1200   CREATININE 0.7 08/19/2012 1021   CREATININE 0.67 03/31/2012 1200      Component Value Date/Time   CALCIUM 8.9 08/19/2012 1021   CALCIUM 8.6 03/31/2012 1200   ALKPHOS 74 08/19/2012 1021   AST 18 08/19/2012 1021   ALT 16 08/19/2012 1021   BILITOT 0.63 08/19/2012 1021       Lab Results  Component Value Date   LABCA2 70* 12/16/2011    No components found with this basename: [id number]     RADIOGRAPHIC STUDIES: No results found.  ASSESSMENT: 67 y.o. with: 1.  Stage II invasive mammary carcinoma of the left breast that is ER positive PR positive HER-2/neu positive with Ki-67 94%. Patient was interested in breast conservation. She received neoadjuvant chemotherapy consisting of Perjeta, Herceptin, and Taxotere. Chemotherapy regimen was given every 21 days for a total of 4 cycles.  MRI of the breasts on 03/07/2012 showed a good interval response to neoadjuvant chemotherapy.  The  patient completed all of her therapy on 03/04/2012.   2.  The patient is now status post left mastectomy with sentinel lymph node biopsy on 04/06/2012. She had a 0.8 cm grade 1 invasive ductal carcinoma with associated low-grade ductal carcinoma in situ. One sentinel node micrometastatic focus of ductal carcinoma. Treatment effect was noted within the primary lesion however nests of ductal carcinoma involving the lymphatics were still present. Margins were negative tumor was ER/PR +100%. She is status post immediate reconstruction with expanders placed at the time of her mastectomy.   3.  The patient completed radiation therapy under the care of Dr. Michell Heinrich on 06/20/12. She will also continue Herceptin infusions every 3 weeks for 1 year.  4. Left upper extremity lymphedema  PLAN:  1. She is doing well and will proceed with adjuvant Herceptin.  Patient had echocardiogram on 4/2 at 60%.  She also saw Dr. Gala Romney on 06/30/12--we requested f/u from his office.  Continue daily arimidex, she will stop the arimidex if the itching continues, or if it worsens in any way or develops any skin changes as well as call us.    2.  She has completed radiation therapy.  Doing well.    3. We plan to see Ms. Ramone again in three weeks for her next Herceptin infusion and we will collect laboratories of the CMP and CBC prior to her infusion.  She will f/u with Dr. Welton Flakes at this appointment.   4.  The patient continues to decline a compression garment for upper extremity lymphedema at this time.  All questions were answered.  The patient was encouraged to contact us in the interim with any problems, questions or concerns.   I spent 25 minutes counseling the patient face to face.  The total time spent in the appointment was 30 minutes.  Cherie Ouch Lyn Hollingshead, NP Medical Oncology [facility] Phone: [phone] 09/09/2012, 12:13 PM

## 2012-09-12 ENCOUNTER — Encounter (INDEPENDENT_AMBULATORY_CARE_PROVIDER_SITE_OTHER): Payer: Medicare Other | Admitting: General Surgery

## 2012-09-13 ENCOUNTER — Encounter (INDEPENDENT_AMBULATORY_CARE_PROVIDER_SITE_OTHER): Payer: Medicare Other | Admitting: General Surgery

## 2012-09-13 ENCOUNTER — Ambulatory Visit (INDEPENDENT_AMBULATORY_CARE_PROVIDER_SITE_OTHER): Payer: Medicare Other | Admitting: General Surgery

## 2012-09-13 ENCOUNTER — Encounter (INDEPENDENT_AMBULATORY_CARE_PROVIDER_SITE_OTHER): Payer: Self-pay | Admitting: General Surgery

## 2012-09-13 VITALS — BP 124/72 | HR 78 | Resp 14 | Ht 64.0 in | Wt 187.4 lb

## 2012-09-13 DIAGNOSIS — R21 Rash and other nonspecific skin eruption: Secondary | ICD-10-CM

## 2012-09-13 MED ORDER — METHYLPREDNISOLONE (PAK) 4 MG PO TABS: 4.0000 mg | ORAL_TABLET | Freq: Every day | ORAL | Status: DC

## 2012-09-15 NOTE — Progress Notes (Signed)
Subjective:     Patient ID: Marie Anderson, female   DOB: 03/11/1945, 67 y.o.   MRN: [id number]  HPI This is a 67 year old female underwent primary systemic chemotherapy followed by a left mastectomy left axillary sentinel lymph node biopsy. She did undergo immediate expander reconstruction. On her final pathology this shows an invasive grade 1 ductal carcinoma measuring 8 mm with associated low-grade ductal carcinoma in situ and lymphovascular invasion. Margins were all negative. She has undergone xrt.  She continues on herceptin but recently she has had itching and a rash develop around her port site. She was seen by one of my partners recently and recommended attempt cortisone cream which has not helped. Port is functional.  Chlorprep is being used.  Review of Systems     Objective:   Physical Exam Port in place with healed incision, she has over 10 cm papular rash with signs of excoriation where she has been itching, no infection    Assessment:     Rash around port site     Plan:     Would not continue to use chloraprep Steroid dosepack given, if this does not work will consider derm referral.  She is going to call back at completion of steroids to let us know how she is doing.

## 2012-09-30 ENCOUNTER — Encounter: Payer: Self-pay | Admitting: Oncology

## 2012-09-30 ENCOUNTER — Ambulatory Visit (HOSPITAL_BASED_OUTPATIENT_CLINIC_OR_DEPARTMENT_OTHER): Payer: Medicare Other | Admitting: Oncology

## 2012-09-30 ENCOUNTER — Ambulatory Visit (HOSPITAL_BASED_OUTPATIENT_CLINIC_OR_DEPARTMENT_OTHER): Payer: Medicare Other

## 2012-09-30 ENCOUNTER — Other Ambulatory Visit (HOSPITAL_BASED_OUTPATIENT_CLINIC_OR_DEPARTMENT_OTHER): Payer: Medicare Other | Admitting: Lab

## 2012-09-30 VITALS — BP 125/80 | HR 83 | Temp 98.4°F | Resp 20 | Ht 64.0 in | Wt 187.7 lb

## 2012-09-30 DIAGNOSIS — C50919 Malignant neoplasm of unspecified site of unspecified female breast: Secondary | ICD-10-CM

## 2012-09-30 DIAGNOSIS — C50212 Malignant neoplasm of upper-inner quadrant of left female breast: Secondary | ICD-10-CM

## 2012-09-30 DIAGNOSIS — C50912 Malignant neoplasm of unspecified site of left female breast: Secondary | ICD-10-CM

## 2012-09-30 DIAGNOSIS — Z5112 Encounter for antineoplastic immunotherapy: Secondary | ICD-10-CM

## 2012-09-30 DIAGNOSIS — C50219 Malignant neoplasm of upper-inner quadrant of unspecified female breast: Secondary | ICD-10-CM

## 2012-09-30 DIAGNOSIS — Z170 Estrogen receptor positive status [ER+]: Secondary | ICD-10-CM

## 2012-09-30 LAB — COMPREHENSIVE METABOLIC PANEL (CC13)
AST: 20 U/L (ref 5–34)
Albumin: 3.7 g/dL (ref 3.5–5.0)
Alkaline Phosphatase: 74 U/L (ref 40–150)
Glucose: 83 mg/dl (ref 70–140)
Potassium: 4 mEq/L (ref 3.5–5.1)
Sodium: 143 mEq/L (ref 136–145)
Total Bilirubin: 0.53 mg/dL (ref 0.20–1.20)
Total Protein: 6.8 g/dL (ref 6.4–8.3)

## 2012-09-30 LAB — CBC WITH DIFFERENTIAL/PLATELET
BASO%: 0 % (ref 0.0–2.0)
Basophils Absolute: 0 10*3/uL (ref 0.0–0.1)
EOS%: 2.5 % (ref 0.0–7.0)
HGB: 12.5 g/dL (ref 11.6–15.9)
MCH: 31.6 pg (ref 25.1–34.0)
MCHC: 32.9 g/dL (ref 31.5–36.0)
MCV: 96 fL (ref 79.5–101.0)
MONO%: 7.5 % (ref 0.0–14.0)
RBC: 3.96 10*6/uL (ref 3.70–5.45)
RDW: 14.8 % — ABNORMAL HIGH (ref 11.2–14.5)
lymph#: 0.7 10*3/uL — ABNORMAL LOW (ref 0.9–3.3)
nRBC: 0 % (ref 0–0)

## 2012-09-30 MED ORDER — SODIUM CHLORIDE 0.9 % IJ SOLN
10.0000 mL | INTRAMUSCULAR | Status: DC | PRN
  Administered 2012-09-30: 10 mL
  Filled 2012-09-30: qty 10

## 2012-09-30 MED ORDER — ACETAMINOPHEN 325 MG PO TABS
650.0000 mg | ORAL_TABLET | Freq: Once | ORAL | Status: DC
Start: 2012-09-30 — End: 2012-09-30

## 2012-09-30 MED ORDER — SODIUM CHLORIDE 0.9 % IV SOLN
Freq: Once | INTRAVENOUS | Status: AC
  Administered 2012-09-30: 13:00:00 via INTRAVENOUS

## 2012-09-30 MED ORDER — HEPARIN SOD (PORK) LOCK FLUSH 100 UNIT/ML IV SOLN
500.0000 [IU] | Freq: Once | INTRAVENOUS | Status: AC | PRN
  Administered 2012-09-30: 500 [IU]
  Filled 2012-09-30: qty 5

## 2012-09-30 MED ORDER — DIPHENHYDRAMINE HCL 25 MG PO CAPS: 50.0000 mg | ORAL_CAPSULE | Freq: Once | ORAL | Status: DC

## 2012-09-30 MED ORDER — TRASTUZUMAB CHEMO INJECTION 440 MG
6.0000 mg/kg | Freq: Once | INTRAVENOUS | Status: AC
  Administered 2012-09-30: 504 mg via INTRAVENOUS
  Filled 2012-09-30: qty 24

## 2012-09-30 NOTE — Patient Instructions (Signed)
[facility] Discharge Instructions for Patients Receiving Chemotherapy  Today you received the following chemotherapy agents Herceptin.  To help prevent nausea and vomiting after your treatment, we encourage you to take your nausea medication as prescribed.   If you develop nausea and vomiting that is not controlled by your nausea medication, call the clinic.   BELOW ARE SYMPTOMS THAT SHOULD BE REPORTED IMMEDIATELY:  *FEVER GREATER THAN 100.5 F  *CHILLS WITH OR WITHOUT FEVER  NAUSEA AND VOMITING THAT IS NOT CONTROLLED WITH YOUR NAUSEA MEDICATION  *UNUSUAL SHORTNESS OF BREATH  *UNUSUAL BRUISING OR BLEEDING  TENDERNESS IN MOUTH AND THROAT WITH OR WITHOUT PRESENCE OF ULCERS  *URINARY PROBLEMS  *BOWEL PROBLEMS  UNUSUAL RASH Items with * indicate a potential emergency and should be followed up as soon as possible.  Feel free to call the clinic you have any questions or concerns. The clinic phone number is [phone].    

## 2012-10-07 ENCOUNTER — Encounter: Payer: Self-pay | Admitting: Oncology

## 2012-10-07 NOTE — Progress Notes (Signed)
I called [employer] and the patient is not eligible for asst, with Herceptin, because her insurance will cover 100%.

## 2012-10-09 NOTE — Progress Notes (Signed)
[facility]  Telephone:(336) [phone] Fax:(336) [phone]  OFFICE PROGRESS NOTE  PATIENT: Marie Anderson   DOB: 08/21/1945  MR#: [id number]  JYN#:[id number]  QM:VHQION,GEXB S, MD Dr. Sigmund Hazel  Dr. Emelia Loron  Dr. Lurline Hare  DIAGNOSIS:  67 year old [place], [name] with invasive mammary carcinoma of the left breast ER positive PR positive HER-2/neu positive with Ki-67 94% diagnosed in 12/2011.   PRIOR THERAPY: 1.  The patient was originally seen in the Multidisciplinary Breast Clinic on 12/16/2011. She was diagnosed with stage II a HER-2 positive ER/PR positive breast cancer of the left breast at the 3:00 position. She also had a second area that was ER/PR positive but HER-2/neu negative. Patient is interested in breast conservation.   2.  The patient is now status post 4 cycles of Herceptin progenitor and Taxotere given every 21 days administered from 12/31/2011 to 03/11/2012.   3.  The patient had MRI of the breasts performed on 03/07/2012 that revealed good response to neoadjuvant treatment. She was referred to Dr. Emelia Loron for definitive surgery.   4.  The patient underwent a left mastectomy and sentinel lymph node biopsy on 04/06/2012. This showed a 0.8 cm grade 1 invasive ductal carcinoma with associated low-grade ductal carcinoma in situ. One sentinel lymph node had a micrometastatic focus of ductal carcinoma. Treatment effect was noted within the primary lesion however nests of ductal carcinoma involving the lymphatics were still present. The lymph node shows no evidence of tumor effect. The margins were widely negative. Her tumor was estrogen and progesterone receptor 100% positive. She did have immediate reconstruction with a expander placed at the time of her mastectomy.  5.Radiation therapy from 05/04/12 to 06/20/12.  6. Every three week Herceptin beginning 04/15/12.  We did hold one dose on 05/27/12.    7. Arimidex starting  07/29/12.   CURRENT THERAPY:  Every 3 week herceptin and daily Arimidex.   INTERVAL HISTORY: [name] returns for follow up today prior to her adjuvant Herceptin for her breast cancer.  She continues to take Arimidex daily and has recently noticed itching on her legs.  She is otherwise feeling well and denies fevers, chills, nausea, vomiting, constipation, diarrhea, orthopnea, PND, DOE or any further concerns.  A 10 point ROS is otherwise negative.   PAST MEDICAL HISTORY: Past Medical History  Diagnosis Date  . Anemia   . SVT (supraventricular tachycardia)   . Breast cancer 12/04/11    left- inv ductal ca, DCIS, ER/PR +, HER 2 +  . History of chemotherapy 12/31/11 -03/11/12    s/p 4 cycles  . History of radiation therapy 05/04/12-06/20/12    left breast/    PAST SURGICAL HISTORY: Past Surgical History  Procedure Laterality Date  . Dilation and curettage of uterus    . Eye surgery      age 33-lt eye growth  . Colonoscopy    . Portacath placement  12/23/2011    Procedure: INSERTION PORT-A-CATH;  Surgeon: Emelia Loron, MD;  Location: [facility];  Service: General;  Laterality: Right;  . Mastectomy w/ sentinel node biopsy  04/06/2012    Procedure: MASTECTOMY WITH SENTINEL LYMPH NODE BIOPSY;  Surgeon: Emelia Loron, MD;  Location: [facility];  Service: General;  Laterality: Left;  left mastectomy, left axillary sentinel node biopsy  . Breast reconstruction with placement of tissue expander and flex hd (acellular hydrated dermis)  04/06/2012    Procedure: BREAST RECONSTRUCTION WITH PLACEMENT OF TISSUE EXPANDER AND  FLEX HD (ACELLULAR HYDRATED DERMIS);  Surgeon: Wayland Denis, DO;  Location: [facility];  Service: Plastics;  Laterality: Left;  IMMEDIATE LEFT BREAST RECONSTRUCTION WITH PLACEMENT OF TISSUE EXPANDER AND ALLODERM     FAMILY HISTORY: Family History  Problem Relation Age of Onset  . Stroke Mother   . Cancer Father     prostate     SOCIAL HISTORY: History  Substance Use Topics  . Smoking status: Never Smoker   . Smokeless tobacco: Never Used  . Alcohol Use: Yes    ALLERGIES: Allergies  Allergen Reactions  . Cephalosporins     headache  . Codeine Nausea Only     MEDICATIONS:  Current Outpatient Prescriptions  Medication Sig Dispense Refill  . anastrozole (ARIMIDEX) 1 MG tablet Take 1 tablet (1 mg total) by mouth daily.  30 tablet  6  . diazepam (VALIUM) 2 MG tablet Take 2 mg by mouth as needed.       . hyaluronate sodium (RADIAPLEXRX) GEL Apply topically 2 (two) times daily.      Marland Kitchen HYDROcodone-acetaminophen (NORCO/VICODIN) 5-325 MG per tablet       . lidocaine-prilocaine (EMLA) cream Apply topically as needed. Apply to port as directed  30 g  6  . metoprolol succinate (TOPROL-XL) 100 MG 24 hr tablet Take 1 tablet (100 mg total) by mouth daily.  90 tablet  3  . non-metallic deodorant (ALRA) MISC Apply 1 application topically daily as needed.      Marland Kitchen oxyCODONE-acetaminophen (PERCOCET/ROXICET) 5-325 MG per tablet Take 1 tablet by mouth as needed. 5/325mg       . methylPREDNIsolone (MEDROL DOSPACK) 4 MG tablet Take 1 tablet (4 mg total) by mouth daily. follow package directions  21 tablet  0   No current facility-administered medications for this visit.      REVIEW OF SYSTEMS: A 10 point review of systems was completed and is negative except as noted above.    PHYSICAL EXAMINATION: BP 125/80  Pulse 83  Temp(Src) 98.4 F (36.9 C) (Oral)  Resp 20  Ht 5\' 4"  (1.626 m)  Wt 187 lb 11.2 oz (85.14 kg)  BMI 32.2 kg/m2  General appearance: Alert, cooperative, well nourished, no apparent distress Head: Normocephalic, without obvious abnormality, the patient is wearing a hat Eyes: Conjunctivae/corneas clear, PERRLA, EOMI Nose: Nares, septum and mucosa are normal, no drainage or sinus tenderness Neck: No adenopathy, supple, symmetrical, trachea midline, thyroid not enlarged, no tenderness Resp: Clear to  auscultation bilaterally Cardio: Regular rate and rhythm, S1, S2 normal, no murmur, click, rub or gallop Breasts: Left breast expander present, no purulent discharge, no warmth or swelling, right breast no nipple inversion, no axilla fullness, benign breast exam GI: Soft, distended, non-tender, hypoactive bowel sounds, no organomegaly Extremities: Extremities normal, atraumatic, no cyanosis, left upper extremity lymphedema Lymph nodes: Cervical, supraclavicular, and axillary nodes normal Neurologic: Grossly normal  Skin: no rash or lesion.  Bandage on left deltoid, due to recent skin lesion removal by dermatology.  ECOG FS:  Grade 1 - Symptomatic but completely ambulatory   LAB RESULTS: Lab Results  Component Value Date   WBC 2.4* 09/30/2012   NEUTROABS 1.4* 09/30/2012   HGB 12.5 09/30/2012   HCT 38.0 09/30/2012   MCV 96.0 09/30/2012   PLT 69* 09/30/2012      Chemistry      Component Value Date/Time   NA 143 09/30/2012 1120   NA 143 03/31/2012 1200   K 4.0 09/30/2012 1120   K 3.7 03/31/2012  1200   CL 109* 08/19/2012 1021   CL 107 03/31/2012 1200   CO2 25 09/30/2012 1120   CO2 29 03/31/2012 1200   BUN 14.6 09/30/2012 1120   BUN 7 03/31/2012 1200   CREATININE 0.7 09/30/2012 1120   CREATININE 0.67 03/31/2012 1200      Component Value Date/Time   CALCIUM 9.2 09/30/2012 1120   CALCIUM 8.6 03/31/2012 1200   ALKPHOS 74 09/30/2012 1120   AST 20 09/30/2012 1120   ALT 19 09/30/2012 1120   BILITOT 0.53 09/30/2012 1120       Lab Results  Component Value Date   LABCA2 70* 12/16/2011    No components found with this basename: [id number]     RADIOGRAPHIC STUDIES: No results found.  ASSESSMENT: 67 y.o. with: 1.  Stage II invasive mammary carcinoma of the left breast that is ER positive PR positive HER-2/neu positive with Ki-67 94%. Patient was interested in breast conservation. She received neoadjuvant chemotherapy consisting of Perjeta, Herceptin, and Taxotere. Chemotherapy regimen was given every 21 days for a  total of 4 cycles.  MRI of the breasts on 03/07/2012 showed a good interval response to neoadjuvant chemotherapy.  The patient completed all of her therapy on 03/04/2012.   2.  The patient is now status post left mastectomy with sentinel lymph node biopsy on 04/06/2012. She had a 0.8 cm grade 1 invasive ductal carcinoma with associated low-grade ductal carcinoma in situ. One sentinel node micrometastatic focus of ductal carcinoma. Treatment effect was noted within the primary lesion however nests of ductal carcinoma involving the lymphatics were still present. Margins were negative tumor was ER/PR +100%. She is status post immediate reconstruction with expanders placed at the time of her mastectomy.   3.  The patient completed radiation therapy under the care of Dr. Michell Heinrich on 06/20/12. She will also continue Herceptin infusions every 3 weeks for 1 year.  4. Left upper extremity lymphedema  PLAN:  1. She is doing well and will proceed with adjuvant Herceptin.    #2 patient will be seen back in 3 weeks' time for followup and next Herceptin.  All questions were answered.  The patient was encouraged to contact us in the interim with any problems, questions or concerns.   I spent 25 minutes counseling the patient face to face.  The total time spent in the appointment was 30 minutes.  Drue Second, MD Medical/Oncology [facility] [phone] (beeper) [phone] (Office)

## 2012-10-10 ENCOUNTER — Ambulatory Visit (HOSPITAL_COMMUNITY)
Admission: RE | Admit: 2012-10-10 | Discharge: 2012-10-10 | Disposition: A | Discharge status: Home / Self Care | Payer: Medicare Other | Source: Ambulatory Visit | Attending: Obstetrics and Gynecology | Admitting: Obstetrics and Gynecology

## 2012-10-10 ENCOUNTER — Ambulatory Visit (HOSPITAL_BASED_OUTPATIENT_CLINIC_OR_DEPARTMENT_OTHER)
Admission: RE | Admit: 2012-10-10 | Discharge: 2012-10-10 | Disposition: A | Discharge status: Home / Self Care | Payer: Medicare Other | Source: Ambulatory Visit | Attending: Internal Medicine | Admitting: Internal Medicine

## 2012-10-10 ENCOUNTER — Encounter (HOSPITAL_COMMUNITY): Payer: Self-pay

## 2012-10-10 VITALS — BP 132/70 | HR 70 | Wt 190.0 lb

## 2012-10-10 DIAGNOSIS — C50219 Malignant neoplasm of upper-inner quadrant of unspecified female breast: Secondary | ICD-10-CM

## 2012-10-10 DIAGNOSIS — I059 Rheumatic mitral valve disease, unspecified: Secondary | ICD-10-CM | POA: Insufficient documentation

## 2012-10-10 DIAGNOSIS — I498 Other specified cardiac arrhythmias: Secondary | ICD-10-CM | POA: Insufficient documentation

## 2012-10-10 DIAGNOSIS — C50919 Malignant neoplasm of unspecified site of unspecified female breast: Secondary | ICD-10-CM | POA: Insufficient documentation

## 2012-10-10 DIAGNOSIS — I079 Rheumatic tricuspid valve disease, unspecified: Secondary | ICD-10-CM | POA: Insufficient documentation

## 2012-10-10 DIAGNOSIS — Z09 Encounter for follow-up examination after completed treatment for conditions other than malignant neoplasm: Secondary | ICD-10-CM

## 2012-10-10 DIAGNOSIS — C50212 Malignant neoplasm of upper-inner quadrant of left female breast: Secondary | ICD-10-CM

## 2012-10-10 NOTE — Progress Notes (Signed)
  Echocardiogram 2D Echocardiogram has been performed.  Nathanal Hermiz 10/10/2012, 2:01 PM

## 2012-10-10 NOTE — Patient Instructions (Addendum)
Doing great.  Follow up 3 months with ECHO.   

## 2012-10-10 NOTE — Progress Notes (Signed)
Patient ID: Marie Anderson, female   DOB: 1945/09/05, 67 y.o.   MRN: [id number]  Oncologist: Dr Welton Flakes  General Surgeon: Dr Dwain Sarna  Plastic Surgeon: Dr Kelly Splinter  Cardiologist: Dr [place]  HPI:  Marie Anderson is a 67 yo woman with L breast cancer 12/2011 ER positive PR positive HER-2/neu positive, s/p L Mastectomy, SVT (on metolprolol for 10 years last episode in 01/2012 received Adenosine) and no coronary disease. She is referred to Cardio-Onc clinic by Dr Welton Flakes.   She received neoadjuvant chemotherapy consisting of Perjeta, Herceptin, and Taxotere. Chemotherapy regimen was given every 21 days for a total of 4 cycles. Plan to continue Herceptin until 12/2012. She completed XRT  ECHO 12/25/11 EF 60% Lateral S' 9.8  ECHO 06/01/2012 EF 60% Lateral S' 9.7  ECHO 10/10/2012 EF 60% Lateral S' 9.6  Follow up: Doing well. Denies SOB, orthopnea, or edema. Battling rash on chest that is very pruritic. Had a course of prednisone which helped. Appetite back.   ROS: All systems negative except as listed in HPI, PMH and Problem List.  Past Medical History  Diagnosis Date  . Anemia   . SVT (supraventricular tachycardia)   . Breast cancer 12/04/11    left- inv ductal ca, DCIS, ER/PR +, HER 2 +  . History of chemotherapy 12/31/11 -03/11/12    s/p 4 cycles  . History of radiation therapy 05/04/12-06/20/12    left breast/    Current Outpatient Prescriptions  Medication Sig Dispense Refill  . diazepam (VALIUM) 2 MG tablet Take 2 mg by mouth as needed.       Marland Kitchen HYDROcodone-acetaminophen (NORCO/VICODIN) 5-325 MG per tablet       . lidocaine-prilocaine (EMLA) cream Apply topically as needed. Apply to port as directed  30 g  6  . metoprolol succinate (TOPROL-XL) 100 MG 24 hr tablet Take 1 tablet (100 mg total) by mouth daily.  90 tablet  3  . non-metallic deodorant (ALRA) MISC Apply 1 application topically daily as needed.      Marland Kitchen oxyCODONE-acetaminophen (PERCOCET/ROXICET) 5-325 MG per tablet Take 1 tablet by mouth as  needed. 5/325mg        No current facility-administered medications for this encounter.    PHYSICAL EXAM: Filed Vitals:   10/10/12 1402  BP: 132/70  Pulse: 70  Weight: 190 lb (86.183 kg)  SpO2: 99%    General:  Well appearing. No resp difficulty HEENT: normal Neck: supple. JVP flat. Carotids 2+ bilaterally; no bruits. No lymphadenopathy or thryomegaly appreciated. Cor: PMI normal. Regular rate & rhythm. No rubs, gallops or murmurs. Lungs: clear Abdomen: soft, nontender, nondistended. No hepatosplenomegaly. No bruits or masses. Good bowel sounds. Extremities: no cyanosis, clubbing, rash, edema Neuro: alert & orientedx3, cranial nerves grossly intact. Moves all 4 extremities w/o difficulty. Affect pleasant.   ASSESSMENT & PLAN:  1) Breast Cancer - ECHO reviewed today at visit and Lateral S' stable. - Continue herceptin, repeat ECHO in 3 months after last tx.  Ulla Potash B NP-C 2:24 PM   Patient seen and examined with Ulla Potash, NP. We discussed all aspects of the encounter. I agree with the assessment and plan as stated above.  I reviewed echos personally. EF and Doppler parameters stable. No HF on exam. Continue Herceptin.   Truman Hayward 5:55 PM

## 2012-10-21 ENCOUNTER — Encounter: Payer: Self-pay | Admitting: Adult Health

## 2012-10-21 ENCOUNTER — Ambulatory Visit (HOSPITAL_BASED_OUTPATIENT_CLINIC_OR_DEPARTMENT_OTHER): Payer: Medicare Other

## 2012-10-21 ENCOUNTER — Ambulatory Visit (HOSPITAL_BASED_OUTPATIENT_CLINIC_OR_DEPARTMENT_OTHER): Payer: Medicare Other | Admitting: Adult Health

## 2012-10-21 ENCOUNTER — Other Ambulatory Visit (HOSPITAL_BASED_OUTPATIENT_CLINIC_OR_DEPARTMENT_OTHER): Payer: Medicare Other | Admitting: Lab

## 2012-10-21 VITALS — BP 133/82 | HR 71

## 2012-10-21 VITALS — BP 151/82 | HR 81 | Temp 98.6°F | Resp 18 | Ht 64.0 in | Wt 190.4 lb

## 2012-10-21 DIAGNOSIS — C50919 Malignant neoplasm of unspecified site of unspecified female breast: Secondary | ICD-10-CM

## 2012-10-21 DIAGNOSIS — I890 Lymphedema, not elsewhere classified: Secondary | ICD-10-CM

## 2012-10-21 DIAGNOSIS — C50912 Malignant neoplasm of unspecified site of left female breast: Secondary | ICD-10-CM

## 2012-10-21 DIAGNOSIS — Z5112 Encounter for antineoplastic immunotherapy: Secondary | ICD-10-CM

## 2012-10-21 DIAGNOSIS — C50219 Malignant neoplasm of upper-inner quadrant of unspecified female breast: Secondary | ICD-10-CM

## 2012-10-21 DIAGNOSIS — C50212 Malignant neoplasm of upper-inner quadrant of left female breast: Secondary | ICD-10-CM

## 2012-10-21 LAB — COMPREHENSIVE METABOLIC PANEL (CC13)
AST: 20 U/L (ref 5–34)
Albumin: 3.6 g/dL (ref 3.5–5.0)
Alkaline Phosphatase: 74 U/L (ref 40–150)
Potassium: 3.6 mEq/L (ref 3.5–5.1)
Sodium: 144 mEq/L (ref 136–145)
Total Protein: 6.7 g/dL (ref 6.4–8.3)

## 2012-10-21 LAB — CBC WITH DIFFERENTIAL/PLATELET
BASO%: 0.4 % (ref 0.0–2.0)
EOS%: 2.3 % (ref 0.0–7.0)
MCH: 31.8 pg (ref 25.1–34.0)
MCHC: 33.5 g/dL (ref 31.5–36.0)
MCV: 94.9 fL (ref 79.5–101.0)
MONO%: 9.7 % (ref 0.0–14.0)
NEUT#: 1.5 10*3/uL (ref 1.5–6.5)
RBC: 3.93 10*6/uL (ref 3.70–5.45)
RDW: 14.5 % (ref 11.2–14.5)

## 2012-10-21 MED ORDER — ACETAMINOPHEN 325 MG PO TABS: 650.0000 mg | ORAL_TABLET | Freq: Once | ORAL | Status: DC

## 2012-10-21 MED ORDER — TRASTUZUMAB CHEMO INJECTION 440 MG
6.0000 mg/kg | Freq: Once | INTRAVENOUS | Status: AC
  Administered 2012-10-21: 504 mg via INTRAVENOUS
  Filled 2012-10-21: qty 24

## 2012-10-21 MED ORDER — SODIUM CHLORIDE 0.9 % IV SOLN
Freq: Once | INTRAVENOUS | Status: AC
  Administered 2012-10-21: 13:00:00 via INTRAVENOUS

## 2012-10-21 MED ORDER — HEPARIN SOD (PORK) LOCK FLUSH 100 UNIT/ML IV SOLN
500.0000 [IU] | Freq: Once | INTRAVENOUS | Status: AC | PRN
  Administered 2012-10-21: 500 [IU]
  Filled 2012-10-21: qty 5

## 2012-10-21 MED ORDER — SODIUM CHLORIDE 0.9 % IJ SOLN
10.0000 mL | INTRAMUSCULAR | Status: DC | PRN
  Administered 2012-10-21: 10 mL
  Filled 2012-10-21: qty 10

## 2012-10-21 MED ORDER — DIPHENHYDRAMINE HCL 25 MG PO CAPS: 50.0000 mg | ORAL_CAPSULE | Freq: Once | ORAL | Status: DC

## 2012-10-21 NOTE — Progress Notes (Signed)
[facility]  [phone] [phone]) [phone]  OFFICE PROGRESS NOTE  PATIENT: Marie Anderson   DOB: [birth year]  MR#: [id number]  JYN#:[id number]  QM:VHQION,GEXB S, MD Dr. Sigmund Hazel  Dr. Emelia Loron  Dr. Lurline Hare  DIAGNOSIS:  67 year old [place], [name] with invasive mammary carcinoma of the left breast ER positive PR positive HER-2/neu positive with Ki-67 94% diagnosed in 12/2011.   PRIOR THERAPY: 1.  The patient was originally seen in the Multidisciplinary Breast Clinic on 12/16/2011. She was diagnosed with stage II a HER-2 positive ER/PR positive breast cancer of the left breast at the 3:00 position. She also had a second area that was ER/PR positive but HER-2/neu negative. Patient is interested in breast conservation.   2.  The patient is now status post 4 cycles of Herceptin progenitor and Taxotere given every 21 days administered from 12/31/2011 to 03/11/2012.   3.  The patient had MRI of the breasts performed on 03/07/2012 that revealed good response to neoadjuvant treatment. She was referred to Dr. Emelia Loron for definitive surgery.   4.  The patient underwent a left mastectomy and sentinel lymph node biopsy on 04/06/2012. This showed a 0.8 cm grade 1 invasive ductal carcinoma with associated low-grade ductal carcinoma in situ. One sentinel lymph node had a micrometastatic focus of ductal carcinoma. Treatment effect was noted within the primary lesion however nests of ductal carcinoma involving the lymphatics were still present. The lymph node shows no evidence of tumor effect. The margins were widely negative. Her tumor was estrogen and progesterone receptor 100% positive. She did have immediate reconstruction with a expander placed at the time of her mastectomy.  5.Radiation therapy from 05/04/12 to 06/20/12.  6. Every three week Herceptin beginning 04/15/12.  We did hold one dose on 05/27/12.    7. Arimidex starting  07/29/12.   CURRENT THERAPY:  Every 3 week herceptin and daily Arimidex.  INTERVAL HISTORY: [name] returns for follow up today prior to her adjuvant Herceptin for her breast cancer. She is doing well today.  She recently had an appointment with Dr. Gala Romney on 10/10/12 and was cleared to continue Herceptin therapy.  She continues to take Arimidex daily and doing well.  Otherwise a 10 point ROS is negative.    PAST MEDICAL HISTORY: Past Medical History  Diagnosis Date  . Anemia   . SVT (supraventricular tachycardia)   . Breast cancer 12/04/11    left- inv ductal ca, DCIS, ER/PR +, HER 2 +  . History of chemotherapy 12/31/11 -03/11/12    s/p 4 cycles  . History of radiation therapy 05/04/12-06/20/12    left breast/    PAST SURGICAL HISTORY: Past Surgical History  Procedure Laterality Date  . Dilation and curettage of uterus    . Eye surgery      age 77-lt eye growth  . Colonoscopy    . Portacath placement  12/23/2011    Procedure: INSERTION PORT-A-CATH;  Surgeon: Emelia Loron, MD;  Location:  SURGERY CENTER;  Service: General;  Laterality: Right;  . Mastectomy w/ sentinel node biopsy  04/06/2012    Procedure: MASTECTOMY WITH SENTINEL LYMPH NODE BIOPSY;  Surgeon: Emelia Loron, MD;  Location:  SURGERY CENTER;  Service: General;  Laterality: Left;  left mastectomy, left axillary sentinel node biopsy  . Breast reconstruction with placement of tissue expander and flex hd (acellular hydrated dermis)  04/06/2012    Procedure: BREAST RECONSTRUCTION WITH PLACEMENT OF TISSUE EXPANDER AND FLEX HD (ACELLULAR  HYDRATED DERMIS);  Surgeon: Wayland Denis, DO;  Location: [facility];  Service: Plastics;  Laterality: Left;  IMMEDIATE LEFT BREAST RECONSTRUCTION WITH PLACEMENT OF TISSUE EXPANDER AND ALLODERM     FAMILY HISTORY: Family History  Problem Relation Age of Onset  . Stroke Mother   . Cancer Father     prostate    SOCIAL HISTORY: History   Substance Use Topics  . Smoking status: Never Smoker   . Smokeless tobacco: Never Used  . Alcohol Use: Yes    ALLERGIES: Allergies  Allergen Reactions  . Cephalosporins     headache  . Codeine Nausea Only     MEDICATIONS:  Current Outpatient Prescriptions  Medication Sig Dispense Refill  . anastrozole (ARIMIDEX) 1 MG tablet Take 1 mg by mouth daily.      . calcium carbonate (OS-CAL) 600 MG TABS tablet Take 1,200 mg by mouth daily with breakfast.      . cholecalciferol (VITAMIN D) 1000 UNITS tablet Take 1,000 Units by mouth daily.      . diazepam (VALIUM) 2 MG tablet Take 2 mg by mouth as needed.       Marland Kitchen HYDROcodone-acetaminophen (NORCO/VICODIN) 5-325 MG per tablet       . lidocaine-prilocaine (EMLA) cream Apply topically as needed. Apply to port as directed  30 g  6  . metoprolol succinate (TOPROL-XL) 100 MG 24 hr tablet Take 1 tablet (100 mg total) by mouth daily.  90 tablet  3  . non-metallic deodorant (ALRA) MISC Apply 1 application topically daily as needed.      Marland Kitchen oxyCODONE-acetaminophen (PERCOCET/ROXICET) 5-325 MG per tablet Take 1 tablet by mouth as needed. 5/325mg        No current facility-administered medications for this visit.      REVIEW OF SYSTEMS: A 10 point review of systems was completed and is negative except as noted above.    PHYSICAL EXAMINATION: BP 151/82  Pulse 81  Temp(Src) 98.6 F (37 C) (Oral)  Resp 18  Ht 5\' 4"  (1.626 m)  Wt 190 lb 7 oz (86.382 kg)  BMI 32.67 kg/m2  General appearance: Alert, cooperative, well nourished, no apparent distress Head: Normocephalic, without obvious abnormality, the patient is wearing a hat Eyes: Conjunctivae/corneas clear, PERRLA, EOMI Nose: Nares, septum and mucosa are normal, no drainage or sinus tenderness Neck: No adenopathy, supple, symmetrical, trachea midline, thyroid not enlarged, no tenderness Resp: Clear to auscultation bilaterally Cardio: Regular rate and rhythm, S1, S2 normal, no murmur, click,  rub or gallop Breasts: Left breast expander present, no purulent discharge, no warmth or swelling, right breast no nipple inversion, no axilla fullness, benign breast exam GI: Soft, distended, non-tender, hypoactive bowel sounds, no organomegaly Extremities: Extremities normal, atraumatic, no cyanosis, left upper extremity lymphedema Lymph nodes: Cervical, supraclavicular, and axillary nodes normal Neurologic: Grossly normal  Skin: no rash or lesion.    ECOG FS:  Grade 1 - Symptomatic but completely ambulatory   LAB RESULTS: Lab Results  Component Value Date   WBC 2.6* 10/21/2012   NEUTROABS 1.5 10/21/2012   HGB 12.5 10/21/2012   HCT 37.3 10/21/2012   MCV 94.9 10/21/2012   PLT 69* 10/21/2012      Chemistry      Component Value Date/Time   NA 144 10/21/2012 1120   NA 143 03/31/2012 1200   K 3.6 10/21/2012 1120   K 3.7 03/31/2012 1200   CL 109* 08/19/2012 1021   CL 107 03/31/2012 1200   CO2 22 10/21/2012 1120  CO2 29 03/31/2012 1200   BUN 15.7 10/21/2012 1120   BUN 7 03/31/2012 1200   CREATININE 0.7 10/21/2012 1120   CREATININE 0.67 03/31/2012 1200      Component Value Date/Time   CALCIUM 9.1 10/21/2012 1120   CALCIUM 8.6 03/31/2012 1200   ALKPHOS 74 10/21/2012 1120   AST 20 10/21/2012 1120   ALT 17 10/21/2012 1120   BILITOT 0.57 10/21/2012 1120       Lab Results  Component Value Date   LABCA2 70* 12/16/2011    No components found with this basename: [id number]     RADIOGRAPHIC STUDIES: No results found.  ASSESSMENT: 67 y.o. with: 1.  Stage II invasive mammary carcinoma of the left breast that is ER positive PR positive HER-2/neu positive with Ki-67 94%. Patient was interested in breast conservation. She received neoadjuvant chemotherapy consisting of Perjeta, Herceptin, and Taxotere. Chemotherapy regimen was given every 21 days for a total of 4 cycles.  MRI of the breasts on 03/07/2012 showed a good interval response to neoadjuvant chemotherapy.  The patient completed all of her  therapy on 03/04/2012.   2.  The patient is now status post left mastectomy with sentinel lymph node biopsy on 04/06/2012. She had a 0.8 cm grade 1 invasive ductal carcinoma with associated low-grade ductal carcinoma in situ. One sentinel node micrometastatic focus of ductal carcinoma. Treatment effect was noted within the primary lesion however nests of ductal carcinoma involving the lymphatics were still present. Margins were negative tumor was ER/PR +100%. She is status post immediate reconstruction with expanders placed at the time of her mastectomy.   3.  The patient completed radiation therapy under the care of Dr. Michell Heinrich on 06/20/12. She will also continue Herceptin infusions every 3 weeks for 1 year.  She was started on adjuvant arimidex daily and is tolerating it well.    4. Left upper extremity lymphedema  PLAN:  1. She is doing well and will proceed with adjuvant Herceptin. We spent a lot of time discussing treatment plans and upcoming surgery, plastic surgery and port removal.     #2 patient will be seen back in 3 weeks' time for followup and next Herceptin.  All questions were answered.  The patient was encouraged to contact us in the interim with any problems, questions or concerns.   I spent 25 minutes counseling the patient face to face.  The total time spent in the appointment was 30 minutes.  Cherie Ouch Lyn Hollingshead, NP Medical Oncology [facility] Phone: [phone]

## 2012-10-21 NOTE — Patient Instructions (Addendum)
Doing well.  Proceed with Herceptin.  Please call us if you have any questions or concerns.    

## 2012-10-21 NOTE — Patient Instructions (Addendum)
[facility] Discharge Instructions for Patients Receiving Chemotherapy  Today you received the following chemotherapy agents: herceptin  To help prevent nausea and vomiting after your treatment, we encourage you to take your nausea medication.  Take it as often as prescribed.     If you develop nausea and vomiting that is not controlled by your nausea medication, call the clinic. If it is after clinic hours your family physician or the after hours number for the clinic or go to the Emergency Department.   BELOW ARE SYMPTOMS THAT SHOULD BE REPORTED IMMEDIATELY:  *FEVER GREATER THAN 100.5 F  *CHILLS WITH OR WITHOUT FEVER  NAUSEA AND VOMITING THAT IS NOT CONTROLLED WITH YOUR NAUSEA MEDICATION  *UNUSUAL SHORTNESS OF BREATH  *UNUSUAL BRUISING OR BLEEDING  TENDERNESS IN MOUTH AND THROAT WITH OR WITHOUT PRESENCE OF ULCERS  *URINARY PROBLEMS  *BOWEL PROBLEMS  UNUSUAL RASH Items with * indicate a potential emergency and should be followed up as soon as possible.  Feel free to call the clinic you have any questions or concerns. The clinic phone number is [phone].   I have been informed and understand all the instructions given to me. I know to contact the clinic, my physician, or go to the Emergency Department if any problems should occur. I do not have any questions at this time, but understand that I may call the clinic during office hours   should I have any questions or need assistance in obtaining follow up care.    __________________________________________  _____________  __________ Signature of Patient or Authorized Representative            Date                   Time    __________________________________________ Nurse's Signature    

## 2012-10-25 ENCOUNTER — Other Ambulatory Visit: Payer: Self-pay | Admitting: Emergency Medicine

## 2012-10-25 ENCOUNTER — Ambulatory Visit (INDEPENDENT_AMBULATORY_CARE_PROVIDER_SITE_OTHER): Payer: Medicare Other | Admitting: General Surgery

## 2012-10-25 ENCOUNTER — Encounter (INDEPENDENT_AMBULATORY_CARE_PROVIDER_SITE_OTHER): Payer: Self-pay | Admitting: General Surgery

## 2012-10-25 VITALS — BP 102/78 | HR 76 | Resp 14 | Ht 65.0 in | Wt 193.0 lb

## 2012-10-25 DIAGNOSIS — C50212 Malignant neoplasm of upper-inner quadrant of left female breast: Secondary | ICD-10-CM

## 2012-10-25 DIAGNOSIS — C50219 Malignant neoplasm of upper-inner quadrant of unspecified female breast: Secondary | ICD-10-CM

## 2012-10-25 MED ORDER — FLUOCINONIDE-E 0.05 % EX CREA: TOPICAL_CREAM | Freq: Two times a day (BID) | CUTANEOUS | Status: DC

## 2012-10-26 ENCOUNTER — Telehealth: Payer: Self-pay | Admitting: *Deleted

## 2012-10-26 NOTE — Progress Notes (Signed)
Subjective:     Patient ID: Marie Anderson, female   DOB: [birth year], 67 y.o.   MRN: [id number]  HPI 17 yof who underwent primary systemic therapy then had left mastectomy/snbx with expander placement.  She has undergone xrt.  She continues on herceptin.  Herceptin due to be done November.  She is due to have more surgery with Dr Kelly Splinter in October and I told her if ok with med onc we could take port out at same time.  She had rash around her port that resolved with prednisone dosepak and is doing pretty well right now.  Review of Systems     Objective:   Physical Exam cv rrr Lungs clear   right sided port in place with resolving rash Assessment:     Left breast cancer Resolving rash possibly from chloraprep    Plan:     She has resolved the rash.  I told her could take port out in conjunction with dr Kelly Splinter as long as she doesn't need it anymore.  I discussed procedure with risks involved.  Will schedule per dr Kelly Splinter

## 2012-10-26 NOTE — Telephone Encounter (Signed)
Per staff message and POF I have scheduled appts.  JMW  

## 2012-11-11 ENCOUNTER — Encounter: Payer: Self-pay | Admitting: Oncology

## 2012-11-11 ENCOUNTER — Ambulatory Visit (HOSPITAL_BASED_OUTPATIENT_CLINIC_OR_DEPARTMENT_OTHER): Payer: Medicare Other | Admitting: Adult Health

## 2012-11-11 ENCOUNTER — Encounter: Payer: Self-pay | Admitting: Adult Health

## 2012-11-11 ENCOUNTER — Other Ambulatory Visit (HOSPITAL_BASED_OUTPATIENT_CLINIC_OR_DEPARTMENT_OTHER): Payer: Medicare Other | Admitting: Lab

## 2012-11-11 ENCOUNTER — Ambulatory Visit (HOSPITAL_BASED_OUTPATIENT_CLINIC_OR_DEPARTMENT_OTHER): Payer: Medicare Other

## 2012-11-11 VITALS — BP 125/80 | HR 75 | Temp 98.2°F | Resp 20 | Ht 65.0 in | Wt 193.1 lb

## 2012-11-11 DIAGNOSIS — C50212 Malignant neoplasm of upper-inner quadrant of left female breast: Secondary | ICD-10-CM

## 2012-11-11 DIAGNOSIS — C50919 Malignant neoplasm of unspecified site of unspecified female breast: Secondary | ICD-10-CM

## 2012-11-11 DIAGNOSIS — C50219 Malignant neoplasm of upper-inner quadrant of unspecified female breast: Secondary | ICD-10-CM

## 2012-11-11 DIAGNOSIS — Z170 Estrogen receptor positive status [ER+]: Secondary | ICD-10-CM

## 2012-11-11 DIAGNOSIS — Z5112 Encounter for antineoplastic immunotherapy: Secondary | ICD-10-CM

## 2012-11-11 DIAGNOSIS — R599 Enlarged lymph nodes, unspecified: Secondary | ICD-10-CM

## 2012-11-11 DIAGNOSIS — C50912 Malignant neoplasm of unspecified site of left female breast: Secondary | ICD-10-CM

## 2012-11-11 LAB — COMPREHENSIVE METABOLIC PANEL (CC13)
AST: 22 U/L (ref 5–34)
Albumin: 3.6 g/dL (ref 3.5–5.0)
Alkaline Phosphatase: 77 U/L (ref 40–150)
BUN: 14.3 mg/dL (ref 7.0–26.0)
Potassium: 4 mEq/L (ref 3.5–5.1)
Total Bilirubin: 0.59 mg/dL (ref 0.20–1.20)

## 2012-11-11 LAB — CBC WITH DIFFERENTIAL/PLATELET
BASO%: 0.5 % (ref 0.0–2.0)
Basophils Absolute: 0 10e3/uL (ref 0.0–0.1)
EOS%: 2.3 % (ref 0.0–7.0)
Eosinophils Absolute: 0.1 10e3/uL (ref 0.0–0.5)
HCT: 36.3 % (ref 34.8–46.6)
HGB: 12.2 g/dL (ref 11.6–15.9)
LYMPH%: 33.5 % (ref 14.0–49.7)
MCH: 31.8 pg (ref 25.1–34.0)
MCHC: 33.6 g/dL (ref 31.5–36.0)
MCV: 94.5 fL (ref 79.5–101.0)
MONO#: 0.2 10e3/uL (ref 0.1–0.9)
MONO%: 8.3 % (ref 0.0–14.0)
NEUT#: 1.2 10e3/uL — ABNORMAL LOW (ref 1.5–6.5)
NEUT%: 55.4 % (ref 38.4–76.8)
Platelets: 65 10e3/uL — ABNORMAL LOW (ref 145–400)
RBC: 3.84 10e6/uL (ref 3.70–5.45)
RDW: 14.4 % (ref 11.2–14.5)
WBC: 2.2 10e3/uL — ABNORMAL LOW (ref 3.9–10.3)
lymph#: 0.7 10e3/uL — ABNORMAL LOW (ref 0.9–3.3)

## 2012-11-11 MED ORDER — TRASTUZUMAB CHEMO INJECTION 440 MG
6.0000 mg/kg | Freq: Once | INTRAVENOUS | Status: AC
  Administered 2012-11-11: 504 mg via INTRAVENOUS
  Filled 2012-11-11: qty 24

## 2012-11-11 MED ORDER — SODIUM CHLORIDE 0.9 % IV SOLN
Freq: Once | INTRAVENOUS | Status: AC
  Administered 2012-11-11: 13:00:00 via INTRAVENOUS

## 2012-11-11 MED ORDER — ACETAMINOPHEN 325 MG PO TABS: 650.0000 mg | ORAL_TABLET | Freq: Once | ORAL | Status: DC

## 2012-11-11 MED ORDER — HEPARIN SOD (PORK) LOCK FLUSH 100 UNIT/ML IV SOLN
500.0000 [IU] | Freq: Once | INTRAVENOUS | Status: DC | PRN
  Filled 2012-11-11: qty 5

## 2012-11-11 MED ORDER — DIPHENHYDRAMINE HCL 25 MG PO CAPS: 50.0000 mg | ORAL_CAPSULE | Freq: Once | ORAL | Status: DC

## 2012-11-11 MED ORDER — SODIUM CHLORIDE 0.9 % IJ SOLN
10.0000 mL | INTRAMUSCULAR | Status: DC | PRN
  Filled 2012-11-11: qty 10

## 2012-11-11 NOTE — Patient Instructions (Signed)
Doing well. Proceed with Herceptin today.  Please call us if you have any questions or concerns.

## 2012-11-11 NOTE — Progress Notes (Signed)
Faxed 90 pages of office notes and infusion notes to [employer] (cancer policy) per pt's request.

## 2012-11-11 NOTE — Progress Notes (Signed)
[facility]  Telephone:([phone] [phone]  OFFICE PROGRESS NOTE  PATIENT: Marie Anderson   DOB: 1945-05-24  MR#: [id number]  JYN#:[id number]  QM:VHQION,GEXB S, MD Dr. Sigmund Anderson  Dr. Emelia Anderson  Dr. Lurline Hare  DIAGNOSIS:  67 year old [place], [name] with invasive mammary carcinoma of the left breast ER positive PR positive HER-2/neu positive with Ki-67 94% diagnosed in 12/2011.   PRIOR THERAPY: 1.  The patient was originally seen in the [facility]idisciplinary [facility] Clinic on 12/16/2011. She was diagnosed with stage II a HER-2 positive ER/PR positive breast cancer of the left breast at the 3:00 position. She also had a second area that was ER/PR positive but HER-2/neu negative. Patient is interested in breast conservation.   2.  The patient is now status post 4 cycles of Herceptin progenitor and Taxotere given every 21 days administered from 12/31/2011 to 03/11/2012.   3.  The patient had MRI of the breasts performed on 03/07/2012 that revealed good response to neoadjuvant treatment. She was referred to Dr. Emelia Anderson for definitive surgery.   4.  The patient underwent a left mastectomy and sentinel lymph node biopsy on 04/06/2012. This showed a 0.8 cm grade 1 invasive ductal carcinoma with associated low-grade ductal carcinoma in situ. One sentinel lymph node had a micrometastatic focus of ductal carcinoma. Treatment effect was noted within the primary lesion however nests of ductal carcinoma involving the lymphatics were still present. The lymph node shows no evidence of tumor effect. The margins were widely negative. Her tumor was estrogen and progesterone receptor 100% positive. She did have immediate reconstruction with a expander placed at the time of her mastectomy.  5.Radiation therapy from 05/04/12 to 06/20/12.  6. Every three week Herceptin beginning 04/15/12.  We did hold one dose on 05/27/12.    7. Arimidex starting  07/29/12.   CURRENT THERAPY:  Every 3 week herceptin and daily Arimidex.  INTERVAL HISTORY: [name] returns for follow up today prior to her adjuvant Herceptin for her breast cancer.She continues to do very well today.  She is tolerating the Herceptin well without difficulty.  She is also taking the Arimidex daily and denies hot flashes, dryness, joint aches or any other problems with taking this.   She denies fevers, chills, orthopnea, PND, DOE, palpitations or any further concerns.    PAST MEDICAL HISTORY: Past Medical History  Diagnosis Date  . Anemia   . SVT (supraventricular tachycardia)   . Breast cancer 12/04/11    left- inv ductal ca, DCIS, ER/PR +, HER 2 +  . History of chemotherapy 12/31/11 -03/11/12    s/p 4 cycles  . History of radiation therapy 05/04/12-06/20/12    left breast/    PAST SURGICAL HISTORY: Past Surgical History  Procedure Laterality Date  . Dilation and curettage of uterus    . Eye surgery      age 75-lt eye growth  . Colonoscopy    . Portacath placement  12/23/2011    Procedure: INSERTION PORT-A-CATH;  Surgeon: Marie Loron, MD;  Location: [facility];  Service: General;  Laterality: Right;  . Mastectomy w/ sentinel node biopsy  04/06/2012    Procedure: MASTECTOMY WITH SENTINEL LYMPH NODE BIOPSY;  Surgeon: Marie Loron, MD;  Location: [facility];  Service: General;  Laterality: Left;  left mastectomy, left axillary sentinel node biopsy  . Breast reconstruction with placement of tissue expander and flex hd (acellular hydrated dermis)  04/06/2012    Procedure: BREAST  RECONSTRUCTION WITH PLACEMENT OF TISSUE EXPANDER AND FLEX HD (ACELLULAR HYDRATED DERMIS);  Surgeon: Marie Denis, DO;  Location: [facility];  Service: Plastics;  Laterality: Left;  IMMEDIATE LEFT BREAST RECONSTRUCTION WITH PLACEMENT OF TISSUE EXPANDER AND ALLODERM     FAMILY HISTORY: Family History  Problem Relation Age of Onset  . Stroke  Mother   . Cancer Father     prostate    SOCIAL HISTORY: History  Substance Use Topics  . Smoking status: Never Smoker   . Smokeless tobacco: Never Used  . Alcohol Use: Yes    ALLERGIES: Allergies  Allergen Reactions  . Cephalosporins     headache  . Codeine Nausea Only     MEDICATIONS:  Current Outpatient Prescriptions  Medication Sig Dispense Refill  . anastrozole (ARIMIDEX) 1 MG tablet Take 1 mg by mouth daily.      . calcium carbonate (OS-CAL) 600 MG TABS tablet Take 1,200 mg by mouth daily with breakfast.      . cholecalciferol (VITAMIN D) 1000 UNITS tablet Take 1,000 Units by mouth daily.      . diazepam (VALIUM) 2 MG tablet Take 2 mg by mouth as needed.       . fluocinonide-emollient (LIDEX-E) 0.05 % cream Apply topically 2 (two) times daily.  30 g  0  . HYDROcodone-acetaminophen (NORCO/VICODIN) 5-325 MG per tablet       . lidocaine-prilocaine (EMLA) cream Apply topically as needed. Apply to port as directed  30 g  6  . metoprolol succinate (TOPROL-XL) 100 MG 24 hr tablet Take 1 tablet (100 mg total) by mouth daily.  90 tablet  3  . non-metallic deodorant (ALRA) MISC Apply 1 application topically daily as needed.       No current facility-administered medications for this visit.      REVIEW OF SYSTEMS: A 10 point review of systems was completed and is negative except as noted above.    PHYSICAL EXAMINATION: BP 125/80  Pulse 75  Temp(Src) 98.2 F (36.8 C) (Oral)  Resp 20  Ht 5\' 5"  (1.651 m)  Wt 193 lb 1.6 oz (87.59 kg)  BMI 32.13 kg/m2  General appearance: Alert, cooperative, well nourished, no apparent distress Head: Normocephalic, without obvious abnormality, the patient is wearing a hat Eyes: Conjunctivae/corneas clear, PERRLA, EOMI Resp: Clear to auscultation bilaterally Cardio: Regular rate and rhythm, S1, S2 normal, no murmur, click, rub or gallop Breasts: Left breast expander present, no purulent discharge, no warmth or swelling, right breast no  nipple inversion, no axilla fullness, benign breast exam GI: Soft, distended, non-tender, hypoactive bowel sounds, no organomegaly Extremities: Extremities normal, atraumatic, no cyanosis, left upper extremity lymphedema Lymph nodes: Cervical, supraclavicular, and axillary nodes normal Neurologic: Grossly normal  Skin: no rash or lesion.    ECOG FS:  Grade 1 - Symptomatic but completely ambulatory  LAB RESULTS: Lab Results  Component Value Date   WBC 2.2* 11/11/2012   NEUTROABS 1.2* 11/11/2012   HGB 12.2 11/11/2012   HCT 36.3 11/11/2012   MCV 94.5 11/11/2012   PLT 65* 11/11/2012      Chemistry      Component Value Date/Time   NA 144 10/21/2012 1120   NA 143 03/31/2012 1200   K 3.6 10/21/2012 1120   K 3.7 03/31/2012 1200   CL 109* 08/19/2012 1021   CL 107 03/31/2012 1200   CO2 22 10/21/2012 1120   CO2 29 03/31/2012 1200   BUN 15.7 10/21/2012 1120   BUN 7 03/31/2012 1200  CREATININE 0.7 10/21/2012 1120   CREATININE 0.67 03/31/2012 1200      Component Value Date/Time   CALCIUM 9.1 10/21/2012 1120   CALCIUM 8.6 03/31/2012 1200   ALKPHOS 74 10/21/2012 1120   AST 20 10/21/2012 1120   ALT 17 10/21/2012 1120   BILITOT 0.57 10/21/2012 1120       Lab Results  Component Value Date   LABCA2 70* 12/16/2011    No components found with this basename: [id number]     RADIOGRAPHIC STUDIES: No results found.  ASSESSMENT: 67 y.o. with: 1.  Stage II invasive mammary carcinoma of the left breast that is ER positive PR positive HER-2/neu positive with Ki-67 94%. Patient was interested in breast conservation. She received neoadjuvant chemotherapy consisting of Perjeta, Herceptin, and Taxotere. Chemotherapy regimen was given every 21 days for a total of 4 cycles.  MRI of the breasts on 03/07/2012 showed a good interval response to neoadjuvant chemotherapy.  The patient completed all of her chemotherapy on 03/04/2012.   2.  The patient underwent left mastectomy with sentinel lymph node biopsy on  04/06/2012. She had a 0.8 cm grade 1 invasive ductal carcinoma with associated low-grade ductal carcinoma in situ. One sentinel node micrometastatic focus of ductal carcinoma. Treatment effect was noted within the primary lesion however nests of ductal carcinoma involving the lymphatics were still present. Margins were negative tumor was ER/PR +100%. This was followed by immediate left breast reconstruction with expander placement.  3.  The patient completed radiation therapy under the care of Dr. Michell Heinrich on 06/20/12. She will also continue Herceptin infusions every 3 weeks for 1 year.  Following radiation, she was started on adjuvant arimidex daily and is tolerating it well.    4. Left upper extremity lymphedema  PLAN:  1. Doing well.  Proceed with Herceptin.  Patient will continue daily Arimidex.     #2 patient will be seen back in 3 weeks' time for followup and next Herceptin.  All questions were answered.  The patient was encouraged to contact us in the interim with any problems, questions or concerns.   I spent 15 minutes counseling the patient face to face.  The total time spent in the appointment was 30 minutes.  Cherie Ouch Lyn Hollingshead, NP Medical Oncology [facility] Phone: [phone]

## 2012-11-11 NOTE — Patient Instructions (Addendum)
[facility] Discharge Instructions for Patients Receiving Chemotherapy  Today you received the following chemotherapy agents Herceptin.  To help prevent nausea and vomiting after your treatment, we encourage you to take your nausea medication as prescribed.   If you develop nausea and vomiting that is not controlled by your nausea medication, call the clinic.   BELOW ARE SYMPTOMS THAT SHOULD BE REPORTED IMMEDIATELY:  *FEVER GREATER THAN 100.5 F  *CHILLS WITH OR WITHOUT FEVER  NAUSEA AND VOMITING THAT IS NOT CONTROLLED WITH YOUR NAUSEA MEDICATION  *UNUSUAL SHORTNESS OF BREATH  *UNUSUAL BRUISING OR BLEEDING  TENDERNESS IN MOUTH AND THROAT WITH OR WITHOUT PRESENCE OF ULCERS  *URINARY PROBLEMS  *BOWEL PROBLEMS  UNUSUAL RASH Items with * indicate a potential emergency and should be followed up as soon as possible.  Feel free to call the clinic you have any questions or concerns. The clinic phone number is [phone].    

## 2012-12-02 ENCOUNTER — Encounter: Payer: Self-pay | Admitting: Adult Health

## 2012-12-02 ENCOUNTER — Ambulatory Visit (HOSPITAL_BASED_OUTPATIENT_CLINIC_OR_DEPARTMENT_OTHER): Payer: Medicare Other

## 2012-12-02 ENCOUNTER — Ambulatory Visit (HOSPITAL_BASED_OUTPATIENT_CLINIC_OR_DEPARTMENT_OTHER): Payer: Medicare Other | Admitting: Adult Health

## 2012-12-02 ENCOUNTER — Other Ambulatory Visit (HOSPITAL_BASED_OUTPATIENT_CLINIC_OR_DEPARTMENT_OTHER): Payer: Medicare Other | Admitting: Lab

## 2012-12-02 VITALS — BP 132/80 | HR 78 | Temp 97.7°F | Resp 20 | Ht 65.0 in | Wt 195.9 lb

## 2012-12-02 DIAGNOSIS — C50919 Malignant neoplasm of unspecified site of unspecified female breast: Secondary | ICD-10-CM

## 2012-12-02 DIAGNOSIS — C50212 Malignant neoplasm of upper-inner quadrant of left female breast: Secondary | ICD-10-CM

## 2012-12-02 DIAGNOSIS — C50912 Malignant neoplasm of unspecified site of left female breast: Secondary | ICD-10-CM

## 2012-12-02 DIAGNOSIS — Z170 Estrogen receptor positive status [ER+]: Secondary | ICD-10-CM

## 2012-12-02 DIAGNOSIS — Z5112 Encounter for antineoplastic immunotherapy: Secondary | ICD-10-CM

## 2012-12-02 DIAGNOSIS — R599 Enlarged lymph nodes, unspecified: Secondary | ICD-10-CM

## 2012-12-02 LAB — COMPREHENSIVE METABOLIC PANEL (CC13)
ALT: 19 U/L (ref 0–55)
AST: 23 U/L (ref 5–34)
Albumin: 3.7 g/dL (ref 3.5–5.0)
Alkaline Phosphatase: 69 U/L (ref 40–150)
BUN: 14.1 mg/dL (ref 7.0–26.0)
CO2: 26 meq/L (ref 22–29)
Calcium: 8.9 mg/dL (ref 8.4–10.4)
Chloride: 110 meq/L — ABNORMAL HIGH (ref 98–109)
Creatinine: 0.7 mg/dL (ref 0.6–1.1)
Glucose: 79 mg/dL (ref 70–140)
Potassium: 3.8 meq/L (ref 3.5–5.1)
Sodium: 144 meq/L (ref 136–145)
Total Bilirubin: 0.68 mg/dL (ref 0.20–1.20)
Total Protein: 6.7 g/dL (ref 6.4–8.3)

## 2012-12-02 LAB — CBC WITH DIFFERENTIAL/PLATELET
Basophils Absolute: 0 10*3/uL (ref 0.0–0.1)
EOS%: 1.4 % (ref 0.0–7.0)
Eosinophils Absolute: 0 10*3/uL (ref 0.0–0.5)
HCT: 37.5 % (ref 34.8–46.6)
HGB: 12.4 g/dL (ref 11.6–15.9)
LYMPH%: 35.6 % (ref 14.0–49.7)
MCH: 31.7 pg (ref 25.1–34.0)
MCV: 95.9 fL (ref 79.5–101.0)
MONO%: 9.6 % (ref 0.0–14.0)
NEUT%: 53 % (ref 38.4–76.8)
Platelets: 72 10*3/uL — ABNORMAL LOW (ref 145–400)
RDW: 14.5 % (ref 11.2–14.5)

## 2012-12-02 MED ORDER — TRASTUZUMAB CHEMO INJECTION 440 MG
6.0000 mg/kg | Freq: Once | INTRAVENOUS | Status: AC
  Administered 2012-12-02: 504 mg via INTRAVENOUS
  Filled 2012-12-02: qty 24

## 2012-12-02 MED ORDER — DIPHENHYDRAMINE HCL 25 MG PO CAPS: 50.0000 mg | ORAL_CAPSULE | Freq: Once | ORAL | Status: DC

## 2012-12-02 MED ORDER — ACETAMINOPHEN 325 MG PO TABS: 650.0000 mg | ORAL_TABLET | Freq: Once | ORAL | Status: DC

## 2012-12-02 MED ORDER — SODIUM CHLORIDE 0.9 % IV SOLN
Freq: Once | INTRAVENOUS | Status: AC
  Administered 2012-12-02: 14:00:00 via INTRAVENOUS

## 2012-12-02 MED ORDER — HEPARIN SOD (PORK) LOCK FLUSH 100 UNIT/ML IV SOLN
500.0000 [IU] | Freq: Once | INTRAVENOUS | Status: AC | PRN
  Administered 2012-12-02: 500 [IU]
  Filled 2012-12-02: qty 5

## 2012-12-02 MED ORDER — SODIUM CHLORIDE 0.9 % IJ SOLN
10.0000 mL | INTRAMUSCULAR | Status: DC | PRN
  Administered 2012-12-02: 10 mL
  Filled 2012-12-02: qty 10

## 2012-12-02 NOTE — Patient Instructions (Signed)

## 2012-12-02 NOTE — Patient Instructions (Signed)
Doing well.  Proceed with Herceptin.  Please call us if you have any questions or concerns.

## 2012-12-02 NOTE — Progress Notes (Signed)
[facility]  Telephone:(336) [phone] [phone]  OFFICE PROGRESS NOTE  PATIENT: Marie Anderson   DOB: [birth year]  MR#: [id number]  JYN#:[id number]  QM:VHQION,GEXB S, MD Dr. Sigmund Hazel  Dr. Emelia Loron  Dr. Lurline Hare  DIAGNOSIS:  67 year old [place], [name] with invasive mammary carcinoma of the left breast ER positive PR positive HER-2/neu positive with Ki-67 94% diagnosed in 12/2011.   PRIOR THERAPY: 1.  The patient was originally seen in the Multidisciplinary Breast Clinic on 12/16/2011. She was diagnosed with stage II a HER-2 positive ER/PR positive breast cancer of the left breast at the 3:00 position. She also had a second area that was ER/PR positive but HER-2/neu negative. Patient is interested in breast conservation.   2.  The patient is now status post 4 cycles of Herceptin progenitor and Taxotere given every 21 days administered from 12/31/2011 to 03/11/2012.   3.  The patient had MRI of the breasts performed on 03/07/2012 that revealed good response to neoadjuvant treatment. She was referred to Dr. Emelia Loron for definitive surgery.   4.  The patient underwent a left mastectomy and sentinel lymph node biopsy on 04/06/2012. This showed a 0.8 cm grade 1 invasive ductal carcinoma with associated low-grade ductal carcinoma in situ. One sentinel lymph node had a micrometastatic focus of ductal carcinoma. Treatment effect was noted within the primary lesion however nests of ductal carcinoma involving the lymphatics were still present. The lymph node shows no evidence of tumor effect. The margins were widely negative. Her tumor was estrogen and progesterone receptor 100% positive. She did have immediate reconstruction with a expander placed at the time of her mastectomy.  5.Radiation therapy from 05/04/12 to 06/20/12.  6. Every three week Herceptin beginning 04/15/12.  We did hold one dose on 05/27/12.    7. Arimidex starting  07/29/12.   CURRENT THERAPY:  Every 3 week herceptin and daily Arimidex.  INTERVAL HISTORY: [name] returns for follow up today prior to her adjuvant Herceptin for her breast cancer.  She is doing well.  She is taking the Arimidex daily and tolerating it well.  She is looking forward to having her implant placed in her left breast next month.  She denies fevers, chills, nausea, vomiting, constipation, diarrhea, numbness, DOE, orthopnea, or any further concerns.    PAST MEDICAL HISTORY: Past Medical History  Diagnosis Date  . Anemia   . SVT (supraventricular tachycardia)   . Breast cancer 12/04/11    left- inv ductal ca, DCIS, ER/PR +, HER 2 +  . History of chemotherapy 12/31/11 -03/11/12    s/p 4 cycles  . History of radiation therapy 05/04/12-06/20/12    left breast/    PAST SURGICAL HISTORY: Past Surgical History  Procedure Laterality Date  . Dilation and curettage of uterus    . Eye surgery      age 19-lt eye growth  . Colonoscopy    . Portacath placement  12/23/2011    Procedure: INSERTION PORT-A-CATH;  Surgeon: Emelia Loron, MD;  Location: [facility];  Service: General;  Laterality: Right;  . Mastectomy w/ sentinel node biopsy  04/06/2012    Procedure: MASTECTOMY WITH SENTINEL LYMPH NODE BIOPSY;  Surgeon: Emelia Loron, MD;  Location: [facility];  Service: General;  Laterality: Left;  left mastectomy, left axillary sentinel node biopsy  . Breast reconstruction with placement of tissue expander and flex hd (acellular hydrated dermis)  04/06/2012    Procedure: BREAST RECONSTRUCTION WITH PLACEMENT  OF TISSUE EXPANDER AND FLEX HD (ACELLULAR HYDRATED DERMIS);  Surgeon: Wayland Denis, DO;  Location: [facility];  Service: Plastics;  Laterality: Left;  IMMEDIATE LEFT BREAST RECONSTRUCTION WITH PLACEMENT OF TISSUE EXPANDER AND ALLODERM     FAMILY HISTORY: Family History  Problem Relation Age of Onset  . Stroke Mother   . Cancer Father      prostate    SOCIAL HISTORY: History  Substance Use Topics  . Smoking status: Never Smoker   . Smokeless tobacco: Never Used  . Alcohol Use: Yes    ALLERGIES: Allergies  Allergen Reactions  . Cephalosporins     headache  . Codeine Nausea Only     MEDICATIONS:  Current Outpatient Prescriptions  Medication Sig Dispense Refill  . anastrozole (ARIMIDEX) 1 MG tablet Take 1 mg by mouth daily.      . calcium carbonate (OS-CAL) 600 MG TABS tablet Take 1,200 mg by mouth daily with breakfast.      . cholecalciferol (VITAMIN D) 1000 UNITS tablet Take 1,000 Units by mouth daily.      . diazepam (VALIUM) 2 MG tablet Take 2 mg by mouth as needed.       . fluocinonide-emollient (LIDEX-E) 0.05 % cream Apply topically 2 (two) times daily.  30 g  0  . HYDROcodone-acetaminophen (NORCO/VICODIN) 5-325 MG per tablet       . lidocaine-prilocaine (EMLA) cream Apply topically as needed. Apply to port as directed  30 g  6  . metoprolol succinate (TOPROL-XL) 100 MG 24 hr tablet Take 1 tablet (100 mg total) by mouth daily.  90 tablet  3  . non-metallic deodorant (ALRA) MISC Apply 1 application topically daily as needed.       No current facility-administered medications for this visit.      REVIEW OF SYSTEMS: A 10 point review of systems was completed and is negative except as noted above.    PHYSICAL EXAMINATION: BP 132/80  Pulse 78  Temp(Src) 97.7 F (36.5 C) (Oral)  Resp 20  Ht 5\' 5"  (1.651 m)  Wt 195 lb 14.4 oz (88.86 kg)  BMI 32.6 kg/m2  General appearance: Alert, cooperative, well nourished, no apparent distress Head: Normocephalic, without obvious abnormality Eyes: Conjunctivae clear, PERRLA, EOMI Resp: Clear to auscultation bilaterally Cardio: Regular rate and rhythm, S1, S2 normal, no murmur, click, rub or gallop Breasts: Left breast expander present, no purulent discharge, no warmth or swelling, right breast no nipple inversion, no axilla fullness, benign breast exam GI:  Soft, distended, non-tender, hypoactive bowel sounds, no organomegaly Extremities: Extremities normal, atraumatic, no cyanosis, left upper extremity lymphedema Lymph nodes: Cervical, supraclavicular, and axillary nodes normal Neurologic: Grossly normal  Skin: no rash or lesion.    ECOG FS:  Grade 1 - Symptomatic but completely ambulatory  LAB RESULTS: Lab Results  Component Value Date   WBC 2.8* 12/02/2012   NEUTROABS 1.5 12/02/2012   HGB 12.4 12/02/2012   HCT 37.5 12/02/2012   MCV 95.9 12/02/2012   PLT 72* 12/02/2012      Chemistry      Component Value Date/Time   NA 143 11/11/2012 1123   NA 143 03/31/2012 1200   K 4.0 11/11/2012 1123   K 3.7 03/31/2012 1200   CL 109* 08/19/2012 1021   CL 107 03/31/2012 1200   CO2 28 11/11/2012 1123   CO2 29 03/31/2012 1200   BUN 14.3 11/11/2012 1123   BUN 7 03/31/2012 1200   CREATININE 0.8 11/11/2012 1123   CREATININE  0.67 03/31/2012 1200      Component Value Date/Time   CALCIUM 9.3 11/11/2012 1123   CALCIUM 8.6 03/31/2012 1200   ALKPHOS 77 11/11/2012 1123   AST 22 11/11/2012 1123   ALT 20 11/11/2012 1123   BILITOT 0.59 11/11/2012 1123       Lab Results  Component Value Date   LABCA2 70* 12/16/2011    No components found with this basename: [id number]     RADIOGRAPHIC STUDIES: No results found.  ASSESSMENT: 67 y.o. with: 1.  Stage II invasive mammary carcinoma of the left breast that is ER positive PR positive HER-2/neu positive with Ki-67 94%. Patient was interested in breast conservation. She received neoadjuvant chemotherapy consisting of Perjeta, Herceptin, and Taxotere. Chemotherapy regimen was given every 21 days for a total of 4 cycles.  MRI of the breasts on 03/07/2012 showed a good interval response to neoadjuvant chemotherapy.  The patient completed all of her chemotherapy on 03/04/2012.   2.  The patient underwent left mastectomy with sentinel lymph node biopsy on 04/06/2012. She had a 0.8 cm grade 1 invasive ductal carcinoma with  associated low-grade ductal carcinoma in situ. One sentinel node micrometastatic focus of ductal carcinoma. Treatment effect was noted within the primary lesion however nests of ductal carcinoma involving the lymphatics were still present. Margins were negative tumor was ER/PR +100%. This was followed by immediate left breast reconstruction with expander placement.  3.  The patient completed radiation therapy under the care of Dr. Michell Heinrich on 06/20/12. She will also continue Herceptin infusions every 3 weeks for 1 year.  Following radiation, she was started on adjuvant arimidex daily and is tolerating it well.    4. Left upper extremity lymphedema  PLAN:  1. Patient is doing well and will proceed with Herceptin today.  She will continue Arimidex daily.    2.  She will return to see [place] in 3 weeks for her next dose of Herceptin.    All questions were answered.  The patient was encouraged to contact us in the interim with any problems, questions or concerns.   I spent 25 minutes counseling the patient face to face.  The total time spent in the appointment was 30 minutes.  Cherie Ouch Lyn Hollingshead, NP Medical Oncology [facility] Phone: [phone]

## 2012-12-05 ENCOUNTER — Telehealth: Payer: Self-pay | Admitting: *Deleted

## 2012-12-05 NOTE — Telephone Encounter (Signed)
Per staff message I have moved appt from 10/24 to 10/23

## 2012-12-06 ENCOUNTER — Encounter: Payer: Self-pay | Admitting: Oncology

## 2012-12-06 NOTE — Progress Notes (Signed)
Faxed invoices to [employer] and mailed originals back to the patient.

## 2012-12-13 ENCOUNTER — Telehealth: Payer: Self-pay | Admitting: *Deleted

## 2012-12-13 NOTE — Telephone Encounter (Signed)
Pt called [facility] " can i get the flu shot?" Labs and concern reviewed with NP, called pt [facility] " okay to get flu shot." requested call back to confirm message was received.

## 2012-12-22 ENCOUNTER — Other Ambulatory Visit (HOSPITAL_BASED_OUTPATIENT_CLINIC_OR_DEPARTMENT_OTHER): Payer: Medicare Other | Admitting: Lab

## 2012-12-22 ENCOUNTER — Ambulatory Visit (HOSPITAL_BASED_OUTPATIENT_CLINIC_OR_DEPARTMENT_OTHER): Payer: Medicare Other | Admitting: Adult Health

## 2012-12-22 ENCOUNTER — Encounter: Payer: Self-pay | Admitting: Adult Health

## 2012-12-22 ENCOUNTER — Encounter: Payer: Self-pay | Admitting: Oncology

## 2012-12-22 ENCOUNTER — Ambulatory Visit (HOSPITAL_BASED_OUTPATIENT_CLINIC_OR_DEPARTMENT_OTHER): Payer: Medicare Other

## 2012-12-22 VITALS — BP 124/77 | HR 76 | Temp 98.6°F | Resp 18 | Ht 65.0 in | Wt 195.7 lb

## 2012-12-22 DIAGNOSIS — I890 Lymphedema, not elsewhere classified: Secondary | ICD-10-CM

## 2012-12-22 DIAGNOSIS — C50212 Malignant neoplasm of upper-inner quadrant of left female breast: Secondary | ICD-10-CM

## 2012-12-22 DIAGNOSIS — C50919 Malignant neoplasm of unspecified site of unspecified female breast: Secondary | ICD-10-CM

## 2012-12-22 DIAGNOSIS — Z5112 Encounter for antineoplastic immunotherapy: Secondary | ICD-10-CM

## 2012-12-22 DIAGNOSIS — C50912 Malignant neoplasm of unspecified site of left female breast: Secondary | ICD-10-CM

## 2012-12-22 DIAGNOSIS — Z170 Estrogen receptor positive status [ER+]: Secondary | ICD-10-CM

## 2012-12-22 LAB — CBC WITH DIFFERENTIAL/PLATELET
Basophils Absolute: 0 10*3/uL (ref 0.0–0.1)
Eosinophils Absolute: 0.1 10*3/uL (ref 0.0–0.5)
HGB: 12 g/dL (ref 11.6–15.9)
LYMPH%: 28.5 % (ref 14.0–49.7)
MCV: 97.7 fL (ref 79.5–101.0)
MONO#: 0.3 10*3/uL (ref 0.1–0.9)
MONO%: 9.7 % (ref 0.0–14.0)
NEUT#: 1.5 10*3/uL (ref 1.5–6.5)
Platelets: 79 10*3/uL — ABNORMAL LOW (ref 145–400)
RBC: 3.74 10*6/uL (ref 3.70–5.45)
WBC: 2.6 10*3/uL — ABNORMAL LOW (ref 3.9–10.3)

## 2012-12-22 LAB — COMPREHENSIVE METABOLIC PANEL (CC13)
Albumin: 3.6 g/dL (ref 3.5–5.0)
Alkaline Phosphatase: 74 U/L (ref 40–150)
Anion Gap: 7 mEq/L (ref 3–11)
BUN: 15.1 mg/dL (ref 7.0–26.0)
CO2: 27 mEq/L (ref 22–29)
Glucose: 90 mg/dl (ref 70–140)
Potassium: 4.4 mEq/L (ref 3.5–5.1)
Sodium: 143 mEq/L (ref 136–145)
Total Bilirubin: 0.53 mg/dL (ref 0.20–1.20)
Total Protein: 6.8 g/dL (ref 6.4–8.3)

## 2012-12-22 MED ORDER — SODIUM CHLORIDE 0.9 % IJ SOLN
10.0000 mL | INTRAMUSCULAR | Status: DC | PRN
  Administered 2012-12-22: 10 mL
  Filled 2012-12-22: qty 10

## 2012-12-22 MED ORDER — HEPARIN SOD (PORK) LOCK FLUSH 100 UNIT/ML IV SOLN
500.0000 [IU] | Freq: Once | INTRAVENOUS | Status: AC | PRN
  Administered 2012-12-22: 500 [IU]
  Filled 2012-12-22: qty 5

## 2012-12-22 MED ORDER — SODIUM CHLORIDE 0.9 % IV SOLN
Freq: Once | INTRAVENOUS | Status: AC
  Administered 2012-12-22: 15:00:00 via INTRAVENOUS

## 2012-12-22 MED ORDER — ACETAMINOPHEN 325 MG PO TABS: 650.0000 mg | ORAL_TABLET | Freq: Once | ORAL | Status: DC

## 2012-12-22 MED ORDER — DIPHENHYDRAMINE HCL 25 MG PO CAPS: 50.0000 mg | ORAL_CAPSULE | Freq: Once | ORAL | Status: DC

## 2012-12-22 MED ORDER — TRASTUZUMAB CHEMO INJECTION 440 MG
6.0000 mg/kg | Freq: Once | INTRAVENOUS | Status: AC
  Administered 2012-12-22: 504 mg via INTRAVENOUS
  Filled 2012-12-22: qty 24

## 2012-12-22 NOTE — Patient Instructions (Signed)
[facility]   Today you received Herceptin.  To help prevent nausea and vomiting after your treatment, we encourage you to take your nausea medication.   If you develop nausea and vomiting that is not controlled by your nausea medication, call the clinic.   BELOW ARE SYMPTOMS THAT SHOULD BE REPORTED IMMEDIATELY:  *FEVER GREATER THAN 100.5 F  *CHILLS WITH OR WITHOUT FEVER  NAUSEA AND VOMITING THAT IS NOT CONTROLLED WITH YOUR NAUSEA MEDICATION  *UNUSUAL SHORTNESS OF BREATH  *UNUSUAL BRUISING OR BLEEDING  TENDERNESS IN MOUTH AND THROAT WITH OR WITHOUT PRESENCE OF ULCERS  *URINARY PROBLEMS  *BOWEL PROBLEMS  UNUSUAL RASH Items with * indicate a potential emergency and should be followed up as soon as possible.  Feel free to call the clinic you have any questions or concerns. The clinic phone number is [phone].

## 2012-12-22 NOTE — Progress Notes (Signed)
[facility]  Telephone:(336) [phone] Fax:(336) [phone]  OFFICE PROGRESS NOTE  PATIENT: Marie Anderson   DOB: January 12, 1946  MR#: [id number]  JYN#:[id number]  QM:VHQION,GEXB S, MD Dr. Sigmund Hazel  Dr. Emelia Loron  Dr. Lurline Hare  DIAGNOSIS:  67 year old [place], [name] with invasive mammary carcinoma of the left breast ER positive PR positive HER-2/neu positive with Ki-67 94% diagnosed in 12/2011.   PRIOR THERAPY: 1.  The patient was originally seen in the Multidisciplinary Breast Clinic on 12/16/2011. She was diagnosed with stage II a HER-2 positive ER/PR positive breast cancer of the left breast at the 3:00 position. She also had a second area that was ER/PR positive but HER-2/neu negative. Patient is interested in breast conservation.   2.  The patient is now status post 4 cycles of Herceptin progenitor and Taxotere given every 21 days administered from 12/31/2011 to 03/11/2012.   3.  The patient had MRI of the breasts performed on 03/07/2012 that revealed good response to neoadjuvant treatment. She was referred to Dr. Emelia Loron for definitive surgery.   4.  The patient underwent a left mastectomy and sentinel lymph node biopsy on 04/06/2012. This showed a 0.8 cm grade 1 invasive ductal carcinoma with associated low-grade ductal carcinoma in situ. One sentinel lymph node had a micrometastatic focus of ductal carcinoma. Treatment effect was noted within the primary lesion however nests of ductal carcinoma involving the lymphatics were still present. The lymph node shows no evidence of tumor effect. The margins were widely negative. Her tumor was estrogen and progesterone receptor 100% positive. She did have immediate reconstruction with a expander placed at the time of her mastectomy.  5.Radiation therapy from 05/04/12 to 06/20/12.  6. Every three week Herceptin beginning 04/15/12.  We did hold one dose on 05/27/12.    7. Arimidex starting  07/29/12.   CURRENT THERAPY:  Every 3 week herceptin and daily Arimidex.  INTERVAL HISTORY: [name] returns for follow up today prior to her adjuvant Herceptin for her breast cancer.  She is doing well today.  She is nearing the end of her every 3 week Herceptin treatments.  She continues on Arimidex daily.  She denies fevers, chills, pain, palpitations, swelling , DOE, PND, orthopnea or any other concerns.  Otherwise, a 10  Point ROS is negative.    PAST MEDICAL HISTORY: Past Medical History  Diagnosis Date  . Anemia   . SVT (supraventricular tachycardia)   . Breast cancer 12/04/11    left- inv ductal ca, DCIS, ER/PR +, HER 2 +  . History of chemotherapy 12/31/11 -03/11/12    s/p 4 cycles  . History of radiation therapy 05/04/12-06/20/12    left breast/    PAST SURGICAL HISTORY: Past Surgical History  Procedure Laterality Date  . Dilation and curettage of uterus    . Eye surgery      age 89-lt eye growth  . Colonoscopy    . Portacath placement  12/23/2011    Procedure: INSERTION PORT-A-CATH;  Surgeon: Emelia Loron, MD;  Location: [facility];  Service: General;  Laterality: Right;  . Mastectomy w/ sentinel node biopsy  04/06/2012    Procedure: MASTECTOMY WITH SENTINEL LYMPH NODE BIOPSY;  Surgeon: Emelia Loron, MD;  Location: [facility];  Service: General;  Laterality: Left;  left mastectomy, left axillary sentinel node biopsy  . Breast reconstruction with placement of tissue expander and flex hd (acellular hydrated dermis)  04/06/2012    Procedure: BREAST RECONSTRUCTION  WITH PLACEMENT OF TISSUE EXPANDER AND FLEX HD (ACELLULAR HYDRATED DERMIS);  Surgeon: Wayland Denis, DO;  Location: [facility];  Service: Plastics;  Laterality: Left;  IMMEDIATE LEFT BREAST RECONSTRUCTION WITH PLACEMENT OF TISSUE EXPANDER AND ALLODERM     FAMILY HISTORY: Family History  Problem Relation Age of Onset  . Stroke Mother   . Cancer Father     prostate     SOCIAL HISTORY: History  Substance Use Topics  . Smoking status: Never Smoker   . Smokeless tobacco: Never Used  . Alcohol Use: Yes    ALLERGIES: Allergies  Allergen Reactions  . Cephalosporins     headache  . Codeine Nausea Only     MEDICATIONS:  Current Outpatient Prescriptions  Medication Sig Dispense Refill  . anastrozole (ARIMIDEX) 1 MG tablet Take 1 mg by mouth daily.      . calcium carbonate (OS-CAL) 600 MG TABS tablet Take 1,200 mg by mouth daily with breakfast.      . cholecalciferol (VITAMIN D) 1000 UNITS tablet Take 1,000 Units by mouth daily.      . diazepam (VALIUM) 2 MG tablet Take 2 mg by mouth as needed.       . fluocinonide-emollient (LIDEX-E) 0.05 % cream Apply topically 2 (two) times daily.  30 g  0  . HYDROcodone-acetaminophen (NORCO/VICODIN) 5-325 MG per tablet       . lidocaine-prilocaine (EMLA) cream Apply topically as needed. Apply to port as directed  30 g  6  . metoprolol succinate (TOPROL-XL) 100 MG 24 hr tablet Take 1 tablet (100 mg total) by mouth daily.  90 tablet  3  . non-metallic deodorant (ALRA) MISC Apply 1 application topically daily as needed.       No current facility-administered medications for this visit.      REVIEW OF SYSTEMS: A 10 point review of systems was completed and is negative except as noted above.    PHYSICAL EXAMINATION: BP 124/77  Pulse 76  Temp(Src) 98.6 F (37 C) (Oral)  Resp 18  Ht 5\' 5"  (1.651 m)  Wt 195 lb 11.2 oz (88.769 kg)  BMI 32.57 kg/m2  General appearance: Alert, cooperative, well nourished, no apparent distress Head: Normocephalic, without obvious abnormality Eyes: Conjunctivae clear, PERRLA, EOMI Resp: Clear to auscultation bilaterally Cardio: Regular rate and rhythm, S1, S2 normal, no murmur, click, rub or gallop Breasts: Left breast expander present, no purulent discharge, no warmth or swelling, right breast no nipple inversion, no axilla fullness, benign breast exam GI: Soft, distended,  non-tender, hypoactive bowel sounds, no organomegaly Extremities: Extremities normal, atraumatic, no cyanosis, left upper extremity lymphedema Lymph nodes: Cervical, supraclavicular, and axillary nodes normal Neurologic: Grossly normal  Skin: no rash or lesion.    ECOG FS:  Grade 1 - Symptomatic but completely ambulatory  LAB RESULTS: Lab Results  Component Value Date   WBC 2.6* 12/22/2012   NEUTROABS 1.5 12/22/2012   HGB 12.0 12/22/2012   HCT 36.5 12/22/2012   MCV 97.7 12/22/2012   PLT 79* 12/22/2012      Chemistry      Component Value Date/Time   NA 143 12/22/2012 1356   NA 143 03/31/2012 1200   K 4.4 12/22/2012 1356   K 3.7 03/31/2012 1200   CL 109* 08/19/2012 1021   CL 107 03/31/2012 1200   CO2 27 12/22/2012 1356   CO2 29 03/31/2012 1200   BUN 15.1 12/22/2012 1356   BUN 7 03/31/2012 1200   CREATININE 0.8 12/22/2012 1356  CREATININE 0.67 03/31/2012 1200      Component Value Date/Time   CALCIUM 9.2 12/22/2012 1356   CALCIUM 8.6 03/31/2012 1200   ALKPHOS 74 12/22/2012 1356   AST 21 12/22/2012 1356   ALT 17 12/22/2012 1356   BILITOT 0.53 12/22/2012 1356       Lab Results  Component Value Date   LABCA2 70* 12/16/2011    No components found with this basename: [id number]     RADIOGRAPHIC STUDIES: No results found.  ASSESSMENT: 67 y.o. with: 1.  Stage II invasive mammary carcinoma of the left breast that is ER positive PR positive HER-2/neu positive with Ki-67 94%. Patient was interested in breast conservation. She received neoadjuvant chemotherapy consisting of Perjeta, Herceptin, and Taxotere. Chemotherapy regimen was given every 21 days for a total of 4 cycles.  MRI of the breasts on 03/07/2012 showed a good interval response to neoadjuvant chemotherapy.  The patient completed all of her chemotherapy on 03/04/2012.   2.  The patient underwent left mastectomy with sentinel lymph node biopsy on 04/06/2012. She had a 0.8 cm grade 1 invasive ductal carcinoma with  associated low-grade ductal carcinoma in situ. One sentinel node micrometastatic focus of ductal carcinoma. Treatment effect was noted within the primary lesion however nests of ductal carcinoma involving the lymphatics were still present. Margins were negative tumor was ER/PR +100%. This was followed by immediate left breast reconstruction with expander placement.  Her implant placement and port removal is scheduled on 01/19/13.    3.  The patient completed radiation therapy under the care of Dr. Michell Heinrich on 06/20/12. She will also continue Herceptin infusions every 3 weeks for 1 year.  Following radiation, she was started on adjuvant arimidex daily and is tolerating it well.    4. Left upper extremity lymphedema  PLAN:  1.  Patient is doing well, will proceed with Herceptin today.  She will continue taking her daily Arimidex.  She is tolerating treatment well.  Her last echo was on 10/10/12, she will need another one after her next treatment.  2.  She will return in 3 weeks for her final herceptin.    All questions were answered.  The patient was encouraged to contact us in the interim with any problems, questions or concerns.   I spent 15 minutes counseling the patient face to face.  The total time spent in the appointment was 30 minutes.  Illa Level, NP Medical Oncology [facility] [phone]

## 2012-12-23 ENCOUNTER — Other Ambulatory Visit: Payer: BC Managed Care – PPO | Admitting: Lab

## 2012-12-23 ENCOUNTER — Ambulatory Visit: Payer: BC Managed Care – PPO | Admitting: Adult Health

## 2012-12-23 ENCOUNTER — Ambulatory Visit: Payer: BC Managed Care – PPO

## 2012-12-27 ENCOUNTER — Telehealth: Payer: Self-pay | Admitting: Oncology

## 2012-12-27 ENCOUNTER — Telehealth: Payer: Self-pay | Admitting: *Deleted

## 2012-12-27 NOTE — Telephone Encounter (Signed)
Per staff message I have moved appt from 11/14 to 11/13

## 2012-12-28 ENCOUNTER — Telehealth: Payer: Self-pay | Admitting: *Deleted

## 2012-12-28 NOTE — Telephone Encounter (Signed)
Per staff message I have adjusted appt for 11/13

## 2013-01-03 ENCOUNTER — Telehealth (INDEPENDENT_AMBULATORY_CARE_PROVIDER_SITE_OTHER): Payer: Self-pay

## 2013-01-03 NOTE — Telephone Encounter (Signed)
patient states she has a recurrent rash at port site which Dr. Dwain Sarna treated with fluocinonide emollient cream in July  which cleared it up. Asking for refill she is going out of town on Friday [facility] call [facility] @ [phone]

## 2013-01-04 ENCOUNTER — Telehealth (INDEPENDENT_AMBULATORY_CARE_PROVIDER_SITE_OTHER): Payer: Self-pay

## 2013-01-04 ENCOUNTER — Other Ambulatory Visit (INDEPENDENT_AMBULATORY_CARE_PROVIDER_SITE_OTHER): Payer: Self-pay

## 2013-01-04 DIAGNOSIS — R21 Rash and other nonspecific skin eruption: Secondary | ICD-10-CM

## 2013-01-04 MED ORDER — FLUOCINONIDE-E 0.05 % EX CREA: TOPICAL_CREAM | Freq: Two times a day (BID) | CUTANEOUS | Status: DC

## 2013-01-04 NOTE — Telephone Encounter (Signed)
She can have a refill.  

## 2013-01-04 NOTE — Telephone Encounter (Signed)
Called patient to inform her I called her Fluocinonide emoillent cr 0.05% to [facility],Patient states the steroid pack that was given to her in July really helped her rash, she would like to have another dose pk if possible , she is going out town Friday and is scheduled to have her power port removed this month. Please advise

## 2013-01-05 ENCOUNTER — Other Ambulatory Visit: Payer: Self-pay

## 2013-01-05 NOTE — Telephone Encounter (Signed)
Pt called today to check on her request (yesterday) for ORAL steroid pack instead of the topical.  She is treating a rash at the [facility] site.  Pt is leaving town late this evening or early tomorrow, so would like to pick it up before she leaves if possible.  If Dr. Dwain Sarna will order the dose pack, please change it to [facility] [phone].)

## 2013-01-05 NOTE — Telephone Encounter (Signed)
You can write her for a prednisone dosepak

## 2013-01-06 ENCOUNTER — Other Ambulatory Visit (INDEPENDENT_AMBULATORY_CARE_PROVIDER_SITE_OTHER): Payer: Self-pay | Admitting: General Surgery

## 2013-01-06 MED ORDER — PREDNISONE (PAK) 10 MG PO TABS: 20.0000 mg | ORAL_TABLET | Freq: Every morning | ORAL | Status: DC

## 2013-01-06 MED ORDER — PREDNISONE (PAK) 10 MG PO TABS: 10.0000 mg | ORAL_TABLET | Freq: Four times a day (QID) | ORAL | Status: DC

## 2013-01-06 MED ORDER — PREDNISONE (PAK) 10 MG PO TABS: 20.0000 mg | ORAL_TABLET | Freq: Every evening | ORAL | Status: DC

## 2013-01-06 MED ORDER — PREDNISONE (PAK) 10 MG PO TABS: 10.0000 mg | ORAL_TABLET | ORAL | Status: DC

## 2013-01-06 MED ORDER — PREDNISONE (PAK) 10 MG PO TABS: 10.0000 mg | ORAL_TABLET | Freq: Three times a day (TID) | ORAL | Status: DC

## 2013-01-06 NOTE — Telephone Encounter (Signed)
LMOM asking for pt to confirm the pharmacy she wants Dr Dwain Sarna to e-prescribe the Prednisone Dose pack.

## 2013-01-06 NOTE — Telephone Encounter (Signed)
Called pt to let her know that [facility] [place] [facility] a pharmacy. I will get Dr Dwain Sarna to e-prescribe the Prednisone Dose pack to [facility] [address] [place], [place] @ corner of [address] [phone]. The pt understands.

## 2013-01-12 ENCOUNTER — Ambulatory Visit (HOSPITAL_BASED_OUTPATIENT_CLINIC_OR_DEPARTMENT_OTHER): Payer: Medicare Other | Admitting: Adult Health

## 2013-01-12 ENCOUNTER — Telehealth: Payer: Self-pay | Admitting: *Deleted

## 2013-01-12 ENCOUNTER — Ambulatory Visit: Payer: Medicare Other | Admitting: Adult Health

## 2013-01-12 ENCOUNTER — Other Ambulatory Visit (HOSPITAL_BASED_OUTPATIENT_CLINIC_OR_DEPARTMENT_OTHER): Payer: Medicare Other | Admitting: Lab

## 2013-01-12 ENCOUNTER — Ambulatory Visit (HOSPITAL_BASED_OUTPATIENT_CLINIC_OR_DEPARTMENT_OTHER): Payer: Medicare Other

## 2013-01-12 ENCOUNTER — Other Ambulatory Visit: Payer: Medicare Other | Admitting: Lab

## 2013-01-12 ENCOUNTER — Encounter: Payer: Self-pay | Admitting: Oncology

## 2013-01-12 ENCOUNTER — Encounter: Payer: Self-pay | Admitting: Adult Health

## 2013-01-12 VITALS — BP 139/83 | HR 65 | Temp 98.5°F | Resp 20 | Ht 65.0 in | Wt 195.1 lb

## 2013-01-12 DIAGNOSIS — C50912 Malignant neoplasm of unspecified site of left female breast: Secondary | ICD-10-CM

## 2013-01-12 DIAGNOSIS — C50212 Malignant neoplasm of upper-inner quadrant of left female breast: Secondary | ICD-10-CM

## 2013-01-12 DIAGNOSIS — C50919 Malignant neoplasm of unspecified site of unspecified female breast: Secondary | ICD-10-CM

## 2013-01-12 DIAGNOSIS — Z170 Estrogen receptor positive status [ER+]: Secondary | ICD-10-CM

## 2013-01-12 DIAGNOSIS — Z5112 Encounter for antineoplastic immunotherapy: Secondary | ICD-10-CM

## 2013-01-12 LAB — COMPREHENSIVE METABOLIC PANEL (CC13)
ALT: 14 U/L (ref 0–55)
AST: 11 U/L (ref 5–34)
Anion Gap: 8 mEq/L (ref 3–11)
CO2: 25 mEq/L (ref 22–29)
Calcium: 8.8 mg/dL (ref 8.4–10.4)
Chloride: 109 mEq/L (ref 98–109)
Creatinine: 0.7 mg/dL (ref 0.6–1.1)
Potassium: 3.9 mEq/L (ref 3.5–5.1)
Sodium: 143 mEq/L (ref 136–145)
Total Protein: 6.7 g/dL (ref 6.4–8.3)

## 2013-01-12 LAB — CBC WITH DIFFERENTIAL/PLATELET
BASO%: 0.2 % (ref 0.0–2.0)
Eosinophils Absolute: 0.1 10*3/uL (ref 0.0–0.5)
HCT: 38.7 % (ref 34.8–46.6)
LYMPH%: 25.9 % (ref 14.0–49.7)
MCHC: 33.1 g/dL (ref 31.5–36.0)
MONO#: 0.5 10*3/uL (ref 0.1–0.9)
NEUT#: 2.7 10*3/uL (ref 1.5–6.5)
NEUT%: 62.2 % (ref 38.4–76.8)
RBC: 3.98 10*6/uL (ref 3.70–5.45)
RDW: 14.5 % (ref 11.2–14.5)
WBC: 4.3 10*3/uL (ref 3.9–10.3)
lymph#: 1.1 10*3/uL (ref 0.9–3.3)
nRBC: 0 % (ref 0–0)

## 2013-01-12 MED ORDER — DIPHENHYDRAMINE HCL 25 MG PO CAPS: 50.0000 mg | ORAL_CAPSULE | Freq: Once | ORAL | Status: DC

## 2013-01-12 MED ORDER — SODIUM CHLORIDE 0.9 % IJ SOLN
10.0000 mL | INTRAMUSCULAR | Status: DC | PRN
  Administered 2013-01-12: 10 mL
  Filled 2013-01-12: qty 10

## 2013-01-12 MED ORDER — SODIUM CHLORIDE 0.9 % IV SOLN
Freq: Once | INTRAVENOUS | Status: AC
  Administered 2013-01-12: 10:00:00 via INTRAVENOUS

## 2013-01-12 MED ORDER — ACETAMINOPHEN 325 MG PO TABS: 650.0000 mg | ORAL_TABLET | Freq: Once | ORAL | Status: DC

## 2013-01-12 MED ORDER — TRASTUZUMAB CHEMO INJECTION 440 MG
6.0000 mg/kg | Freq: Once | INTRAVENOUS | Status: AC
  Administered 2013-01-12: 504 mg via INTRAVENOUS
  Filled 2013-01-12: qty 24

## 2013-01-12 MED ORDER — HEPARIN SOD (PORK) LOCK FLUSH 100 UNIT/ML IV SOLN
500.0000 [IU] | Freq: Once | INTRAVENOUS | Status: AC | PRN
  Administered 2013-01-12: 500 [IU]
  Filled 2013-01-12: qty 5

## 2013-01-12 NOTE — Telephone Encounter (Signed)
appts made and printed...td 

## 2013-01-12 NOTE — Progress Notes (Addendum)
[facility]  Telephone:(336) [phone] Fax:(336) [phone]  OFFICE PROGRESS NOTE  PATIENT: Marie Anderson   DOB: June 12, 1945  MR#: [id number]  JYN#:[id number]  QM:VHQION,GEXB S, MD Dr. Sigmund Hazel  Dr. Emelia Loron  Dr. Lurline Hare  DIAGNOSIS:  67 year old [place], [name] with invasive mammary carcinoma of the left breast ER positive PR positive HER-2/neu positive with Ki-67 94% diagnosed in 12/2011.   PRIOR THERAPY: 1.  The patient was originally seen in the Multidisciplinary Breast Clinic on 12/16/2011. She was diagnosed with stage II a HER-2 positive ER/PR positive breast cancer of the left breast at the 3:00 position. She also had a second area that was ER/PR positive but HER-2/neu negative. Patient is interested in breast conservation.   2.  The patient is now status post 4 cycles of Herceptin progenitor and Taxotere given every 21 days administered from 12/31/2011 to 03/11/2012.   3.  The patient had MRI of the breasts performed on 03/07/2012 that revealed good response to neoadjuvant treatment. She was referred to Dr. Emelia Loron for definitive surgery.   4.  The patient underwent a left mastectomy and sentinel lymph node biopsy on 04/06/2012. This showed a 0.8 cm grade 1 invasive ductal carcinoma with associated low-grade ductal carcinoma in situ. One sentinel lymph node had a micrometastatic focus of ductal carcinoma. Treatment effect was noted within the primary lesion however nests of ductal carcinoma involving the lymphatics were still present. The lymph node shows no evidence of tumor effect. The margins were widely negative. Her tumor was estrogen and progesterone receptor 100% positive. She did have immediate reconstruction with a expander placed at the time of her mastectomy.  5.Radiation therapy from 05/04/12 to 06/20/12.  6. Every three week Herceptin beginning 04/15/12.  We did hold one dose on 05/27/12.    7. Arimidex starting  07/29/12.   CURRENT THERAPY:  Every 3 week herceptin and daily Arimidex.  INTERVAL HISTORY: Marie Anderson returns for follow up today prior to her adjuvant Herceptin.  This is her final cycle.  She is completing a Prednisone taper for her port itching.  She is also on Amoxicillin for a possible dental abscess.  Otherwise, she denies fevers, chills, chest pain, palpitations, DOE, orthopnea, hot flashes, joint aches, dryness or any further concerns.    PAST MEDICAL HISTORY: Past Medical History  Diagnosis Date  . Anemia   . SVT (supraventricular tachycardia)   . Breast cancer 12/04/11    left- inv ductal ca, DCIS, ER/PR +, HER 2 +  . History of chemotherapy 12/31/11 -03/11/12    s/p 4 cycles  . History of radiation therapy 05/04/12-06/20/12    left breast/    PAST SURGICAL HISTORY: Past Surgical History  Procedure Laterality Date  . Dilation and curettage of uterus    . Eye surgery      age 32-lt eye growth  . Colonoscopy    . Portacath placement  12/23/2011    Procedure: INSERTION PORT-A-CATH;  Surgeon: Emelia Loron, MD;  Location: [facility];  Service: General;  Laterality: Right;  . Mastectomy w/ sentinel node biopsy  04/06/2012    Procedure: MASTECTOMY WITH SENTINEL LYMPH NODE BIOPSY;  Surgeon: Emelia Loron, MD;  Location: [facility];  Service: General;  Laterality: Left;  left mastectomy, left axillary sentinel node biopsy  . Breast reconstruction with placement of tissue expander and flex hd (acellular hydrated dermis)  04/06/2012    Procedure: BREAST RECONSTRUCTION WITH PLACEMENT OF TISSUE EXPANDER AND  FLEX HD (ACELLULAR HYDRATED DERMIS);  Surgeon: Wayland Denis, DO;  Location: [facility];  Service: Plastics;  Laterality: Left;  IMMEDIATE LEFT BREAST RECONSTRUCTION WITH PLACEMENT OF TISSUE EXPANDER AND ALLODERM     FAMILY HISTORY: Family History  Problem Relation Age of Onset  . Stroke Mother   . Cancer Father     prostate     SOCIAL HISTORY: History  Substance Use Topics  . Smoking status: Never Smoker   . Smokeless tobacco: Never Used  . Alcohol Use: Yes    ALLERGIES: Allergies  Allergen Reactions  . Cephalosporins     headache  . Codeine Nausea Only     MEDICATIONS:  Current Outpatient Prescriptions  Medication Sig Dispense Refill  . anastrozole (ARIMIDEX) 1 MG tablet Take 1 mg by mouth daily.      . calcium carbonate (OS-CAL) 600 MG TABS tablet Take 1,200 mg by mouth daily with breakfast.      . cholecalciferol (VITAMIN D) 1000 UNITS tablet Take 1,000 Units by mouth daily.      . diazepam (VALIUM) 2 MG tablet Take 2 mg by mouth as needed.       . fluocinonide-emollient (LIDEX-E) 0.05 % cream Apply topically 2 (two) times daily.  30 g  0  . HYDROcodone-acetaminophen (NORCO/VICODIN) 5-325 MG per tablet       . lidocaine-prilocaine (EMLA) cream Apply topically as needed. Apply to port as directed  30 g  6  . metoprolol succinate (TOPROL-XL) 100 MG 24 hr tablet Take 1 tablet (100 mg total) by mouth daily.  90 tablet  3  . non-metallic deodorant (ALRA) MISC Apply 1 application topically daily as needed.       No current facility-administered medications for this visit.      REVIEW OF SYSTEMS: A 10 point review of systems was completed and is negative except as noted above.    PHYSICAL EXAMINATION: BP 139/83  Pulse 65  Temp(Src) 98.5 F (36.9 C) (Oral)  Resp 20  Ht 5\' 5"  (1.651 m)  Wt 195 lb 1.6 oz (88.497 kg)  BMI 32.47 kg/m2  General appearance: Alert, cooperative, well nourished, no apparent distress Head: Normocephalic, without obvious abnormality Eyes: Conjunctivae clear, PERRLA, EOMI Resp: Clear to auscultation bilaterally Cardio: Regular rate and rhythm, S1, S2 normal, no murmur, click, rub or gallop Breasts: Left breast expander present, no purulent discharge, no warmth or swelling, right breast no nipple inversion, no axilla fullness, benign breast exam GI: Soft, distended,  non-tender, hypoactive bowel sounds, no organomegaly Extremities: Extremities normal, atraumatic, no cyanosis, left upper extremity lymphedema Lymph nodes: Cervical, supraclavicular, and axillary nodes normal Neurologic: Grossly normal  Skin: no rash or lesion.    ECOG FS:  Grade 1 - Symptomatic but completely ambulatory  LAB RESULTS: Lab Results  Component Value Date   WBC 4.3 01/12/2013   NEUTROABS 2.7 01/12/2013   HGB 12.8 01/12/2013   HCT 38.7 01/12/2013   MCV 97.2 01/12/2013   PLT 103* 01/12/2013      Chemistry      Component Value Date/Time   NA 143 12/22/2012 1356   NA 143 03/31/2012 1200   K 4.4 12/22/2012 1356   K 3.7 03/31/2012 1200   CL 109* 08/19/2012 1021   CL 107 03/31/2012 1200   CO2 27 12/22/2012 1356   CO2 29 03/31/2012 1200   BUN 15.1 12/22/2012 1356   BUN 7 03/31/2012 1200   CREATININE 0.8 12/22/2012 1356   CREATININE 0.67 03/31/2012 1200  Component Value Date/Time   CALCIUM 9.2 12/22/2012 1356   CALCIUM 8.6 03/31/2012 1200   ALKPHOS 74 12/22/2012 1356   AST 21 12/22/2012 1356   ALT 17 12/22/2012 1356   BILITOT 0.53 12/22/2012 1356       Lab Results  Component Value Date   LABCA2 70* 12/16/2011    No components found with this basename: [id number]     RADIOGRAPHIC STUDIES: No results found.  ASSESSMENT: 67 y.o. with: 1.  Stage II invasive mammary carcinoma of the left breast that is ER positive PR positive HER-2/neu positive with Ki-67 94%. Patient was interested in breast conservation. She received neoadjuvant chemotherapy consisting of Perjeta, Herceptin, and Taxotere. Chemotherapy regimen was given every 21 days for a total of 4 cycles.  MRI of the breasts on 03/07/2012 showed a good interval response to neoadjuvant chemotherapy.  The patient completed all of her chemotherapy on 03/04/2012.   2.  The patient underwent left mastectomy with sentinel lymph node biopsy on 04/06/2012. She had a 0.8 cm grade 1 invasive ductal carcinoma with  associated low-grade ductal carcinoma in situ. One sentinel node micrometastatic focus of ductal carcinoma. Treatment effect was noted within the primary lesion however nests of ductal carcinoma involving the lymphatics were still present. Margins were negative tumor was ER/PR +100%. This was followed by immediate left breast reconstruction with expander placement.  Her implant placement and port removal is scheduled on 01/19/13.    3.  The patient completed radiation therapy under the care of Dr. Michell Heinrich on 06/20/12. She will also continue Herceptin infusions every 3 weeks for 1 year.  Following radiation, she was started on adjuvant arimidex daily and is tolerating it well.    4. Left upper extremity lymphedema  PLAN:  1.  Patient will proceed with Herceptin therapy and continue adjuvant Arimidex therapy.  She will f/u with Dr. Gala Romney for cardiac evaluation and echocardiogram on 02/06/13.  2.  She will proceed with implant placement on 11/20 along with port removal.    3.  Patient will return in 6 months.   All questions were answered.  The patient was encouraged to contact us in the interim with any problems, questions or concerns.   I spent 25 minutes counseling the patient face to face.  The total time spent in the appointment was 30 minutes.  Illa Level, NP Medical Oncology [facility] [phone]   ATTENDING'S ATTESTATION:  I personally reviewed patient's chart, examined patient myself, formulated the treatment plan as followed.    Overall patient is doing well she's tolerating Herceptin very nicely and she is also on adjuvant Arimidex. She is a followup coming up with cardiology as well. Patient would like to have her port removed we will make plans for this when she has completed all of her therapy.  Drue Second, MD Medical/Oncology [facility] [phone] (beeper) [phone] (Office)  02/02/2013, 7:01 PM

## 2013-01-12 NOTE — Patient Instructions (Signed)
[facility] Discharge Instructions for Patients Receiving Chemotherapy  Today you received Herceptin.   If you develop nausea and vomiting that is not controlled by your nausea medication, call the clinic.   BELOW ARE SYMPTOMS THAT SHOULD BE REPORTED IMMEDIATELY:  *FEVER GREATER THAN 100.5 F  *CHILLS WITH OR WITHOUT FEVER  NAUSEA AND VOMITING THAT IS NOT CONTROLLED WITH YOUR NAUSEA MEDICATION  *UNUSUAL SHORTNESS OF BREATH  *UNUSUAL BRUISING OR BLEEDING  TENDERNESS IN MOUTH AND THROAT WITH OR WITHOUT PRESENCE OF ULCERS  *URINARY PROBLEMS  *BOWEL PROBLEMS  UNUSUAL RASH Items with * indicate a potential emergency and should be followed up as soon as possible.  Feel free to call the clinic you have any questions or concerns. The clinic phone number is [phone].    

## 2013-01-13 ENCOUNTER — Other Ambulatory Visit: Payer: Medicare Other | Admitting: Lab

## 2013-01-13 ENCOUNTER — Telehealth (INDEPENDENT_AMBULATORY_CARE_PROVIDER_SITE_OTHER): Payer: Self-pay

## 2013-01-13 ENCOUNTER — Ambulatory Visit: Payer: Medicare Other

## 2013-01-13 ENCOUNTER — Ambulatory Visit: Payer: Medicare Other | Admitting: Oncology

## 2013-01-13 NOTE — Telephone Encounter (Signed)
Called patient back and let her know Dr Dwain Sarna is ok with going forward with surgery from his stand point and let her know she will need to contact Dr Kelly Splinter and ask her. She will call them now.

## 2013-01-13 NOTE — Telephone Encounter (Signed)
It is fine with me but don't know that is ok with dr Kelly Splinter

## 2013-01-13 NOTE — Telephone Encounter (Signed)
Patient called to let Dr Dwain Sarna know she has an abscessed tooth and is on amoxicillin now until Tuesday. Her PAC surgery is Thursday. Will this effect her surgery next week? I advised her to call Dr Earlie Server office as well.

## 2013-01-16 ENCOUNTER — Encounter (HOSPITAL_BASED_OUTPATIENT_CLINIC_OR_DEPARTMENT_OTHER): Payer: Self-pay | Admitting: *Deleted

## 2013-01-16 NOTE — Progress Notes (Signed)
Had labs 01/12/13-

## 2013-01-19 ENCOUNTER — Ambulatory Visit (HOSPITAL_BASED_OUTPATIENT_CLINIC_OR_DEPARTMENT_OTHER)
Admission: RE | Admit: 2013-01-19 | Discharge: 2013-01-19 | Disposition: A | Discharge status: Home / Self Care | Payer: Medicare Other | Source: Ambulatory Visit | Attending: Plastic Surgery | Admitting: Plastic Surgery

## 2013-01-19 ENCOUNTER — Encounter (HOSPITAL_BASED_OUTPATIENT_CLINIC_OR_DEPARTMENT_OTHER)
Admission: RE | Disposition: A | Discharge status: Home / Self Care | Payer: Self-pay | Source: Ambulatory Visit | Attending: Plastic Surgery

## 2013-01-19 ENCOUNTER — Encounter (HOSPITAL_BASED_OUTPATIENT_CLINIC_OR_DEPARTMENT_OTHER): Payer: Self-pay | Admitting: *Deleted

## 2013-01-19 ENCOUNTER — Ambulatory Visit (HOSPITAL_BASED_OUTPATIENT_CLINIC_OR_DEPARTMENT_OTHER): Payer: Medicare Other | Admitting: Anesthesiology

## 2013-01-19 ENCOUNTER — Encounter (HOSPITAL_BASED_OUTPATIENT_CLINIC_OR_DEPARTMENT_OTHER): Payer: Medicare Other | Admitting: Anesthesiology

## 2013-01-19 ENCOUNTER — Other Ambulatory Visit: Payer: Self-pay | Admitting: Plastic Surgery

## 2013-01-19 DIAGNOSIS — Z9012 Acquired absence of left breast and nipple: Secondary | ICD-10-CM

## 2013-01-19 DIAGNOSIS — Z853 Personal history of malignant neoplasm of breast: Secondary | ICD-10-CM | POA: Insufficient documentation

## 2013-01-19 DIAGNOSIS — Z9010 Acquired absence of unspecified breast and nipple: Secondary | ICD-10-CM | POA: Insufficient documentation

## 2013-01-19 DIAGNOSIS — Z452 Encounter for adjustment and management of vascular access device: Secondary | ICD-10-CM | POA: Insufficient documentation

## 2013-01-19 DIAGNOSIS — N651 Disproportion of reconstructed breast: Secondary | ICD-10-CM | POA: Insufficient documentation

## 2013-01-19 HISTORY — PX: REMOVAL OF TISSUE EXPANDER AND PLACEMENT OF IMPLANT: SHX6457

## 2013-01-19 HISTORY — PX: MASTOPEXY: SHX5358

## 2013-01-19 HISTORY — PX: LIPOSUCTION WITH LIPOFILLING: SHX6436

## 2013-01-19 HISTORY — PX: PORT-A-CATH REMOVAL: SHX5289

## 2013-01-19 SURGERY — REMOVAL, TISSUE EXPANDER, BREAST, WITH IMPLANT INSERTION
Anesthesia: General | Site: Chest | Laterality: Right | Wound class: Clean

## 2013-01-19 MED ORDER — PROMETHAZINE HCL 25 MG/ML IJ SOLN
INTRAMUSCULAR | Status: AC
  Filled 2013-01-19: qty 1

## 2013-01-19 MED ORDER — PROMETHAZINE HCL 25 MG/ML IJ SOLN
6.2500 mg | INTRAMUSCULAR | Status: AC | PRN
  Administered 2013-01-19 (×2): 6.25 mg via INTRAVENOUS

## 2013-01-19 MED ORDER — HYDROMORPHONE HCL PF 1 MG/ML IJ SOLN: 0.2500 mg | INTRAMUSCULAR | Status: DC | PRN

## 2013-01-19 MED ORDER — MIDAZOLAM HCL 5 MG/5ML IJ SOLN
INTRAMUSCULAR | Status: DC | PRN
  Administered 2013-01-19: 2 mg via INTRAVENOUS

## 2013-01-19 MED ORDER — SODIUM CHLORIDE 0.9 % IJ SOLN
INTRAMUSCULAR | Status: AC
  Filled 2013-01-19: qty 10

## 2013-01-19 MED ORDER — SUCCINYLCHOLINE CHLORIDE 20 MG/ML IJ SOLN
INTRAMUSCULAR | Status: AC
  Filled 2013-01-19: qty 1

## 2013-01-19 MED ORDER — MIDAZOLAM HCL 2 MG/2ML IJ SOLN: 1.0000 mg | INTRAMUSCULAR | Status: DC | PRN

## 2013-01-19 MED ORDER — BUPIVACAINE HCL (PF) 0.25 % IJ SOLN
INTRAMUSCULAR | Status: DC | PRN
  Administered 2013-01-19: 2 mL

## 2013-01-19 MED ORDER — PROMETHAZINE HCL 25 MG/ML IJ SOLN
6.2500 mg | INTRAMUSCULAR | Status: DC | PRN
  Administered 2013-01-19: 12.5 mg via INTRAVENOUS

## 2013-01-19 MED ORDER — OXYCODONE HCL 5 MG/5ML PO SOLN: 5.0000 mg | Freq: Once | ORAL | Status: DC | PRN

## 2013-01-19 MED ORDER — FENTANYL CITRATE 0.05 MG/ML IJ SOLN
INTRAMUSCULAR | Status: AC
  Filled 2013-01-19: qty 8

## 2013-01-19 MED ORDER — ONDANSETRON HCL 4 MG/2ML IJ SOLN
INTRAMUSCULAR | Status: DC | PRN
  Administered 2013-01-19: 4 mg via INTRAVENOUS

## 2013-01-19 MED ORDER — SODIUM CHLORIDE 0.9 % IR SOLN
Status: DC | PRN
  Administered 2013-01-19: 09:00:00

## 2013-01-19 MED ORDER — OXYCODONE HCL 5 MG PO TABS: 5.0000 mg | ORAL_TABLET | Freq: Once | ORAL | Status: DC | PRN

## 2013-01-19 MED ORDER — EPINEPHRINE HCL 1 MG/ML IJ SOLN
INTRAMUSCULAR | Status: AC
  Filled 2013-01-19: qty 1

## 2013-01-19 MED ORDER — FENTANYL CITRATE 0.05 MG/ML IJ SOLN
INTRAMUSCULAR | Status: DC | PRN
  Administered 2013-01-19 (×3): 25 ug via INTRAVENOUS
  Administered 2013-01-19: 100 ug via INTRAVENOUS
  Administered 2013-01-19: 50 ug via INTRAVENOUS

## 2013-01-19 MED ORDER — LIDOCAINE HCL (CARDIAC) 20 MG/ML IV SOLN
INTRAVENOUS | Status: DC | PRN
  Administered 2013-01-19: 100 mg via INTRAVENOUS

## 2013-01-19 MED ORDER — MIDAZOLAM HCL 2 MG/2ML IJ SOLN
INTRAMUSCULAR | Status: AC
  Filled 2013-01-19: qty 2

## 2013-01-19 MED ORDER — LIDOCAINE-EPINEPHRINE 0.5 %-1:200000 IJ SOLN
INTRAMUSCULAR | Status: AC
  Filled 2013-01-19: qty 1

## 2013-01-19 MED ORDER — CIPROFLOXACIN IN D5W 400 MG/200ML IV SOLN
INTRAVENOUS | Status: AC
  Filled 2013-01-19: qty 200

## 2013-01-19 MED ORDER — LACTATED RINGERS IV SOLN
INTRAVENOUS | Status: DC
  Administered 2013-01-19 (×6): via INTRAVENOUS

## 2013-01-19 MED ORDER — DEXAMETHASONE SODIUM PHOSPHATE 4 MG/ML IJ SOLN
INTRAMUSCULAR | Status: DC | PRN
  Administered 2013-01-19: 10 mg via INTRAVENOUS

## 2013-01-19 MED ORDER — EPHEDRINE SULFATE 50 MG/ML IJ SOLN
INTRAMUSCULAR | Status: DC | PRN
  Administered 2013-01-19: 15 mg via INTRAVENOUS
  Administered 2013-01-19: 10 mg via INTRAVENOUS

## 2013-01-19 MED ORDER — LIDOCAINE-EPINEPHRINE (PF) 1 %-1:200000 IJ SOLN
INTRAMUSCULAR | Status: AC
  Filled 2013-01-19: qty 10

## 2013-01-19 MED ORDER — LIDOCAINE HCL (PF) 1 % IJ SOLN
INTRAMUSCULAR | Status: AC
  Filled 2013-01-19: qty 60

## 2013-01-19 MED ORDER — LIDOCAINE HCL 1 % IJ SOLN
INTRAVENOUS | Status: DC | PRN
  Administered 2013-01-19: 09:00:00 via INTRAMUSCULAR

## 2013-01-19 MED ORDER — MEPERIDINE HCL 25 MG/ML IJ SOLN: 6.2500 mg | INTRAMUSCULAR | Status: DC | PRN

## 2013-01-19 MED ORDER — ONDANSETRON HCL 4 MG/2ML IJ SOLN: 4.0000 mg | Freq: Once | INTRAMUSCULAR | Status: DC | PRN

## 2013-01-19 MED ORDER — PROPOFOL 10 MG/ML IV BOLUS
INTRAVENOUS | Status: DC | PRN
  Administered 2013-01-19: 150 mg via INTRAVENOUS

## 2013-01-19 MED ORDER — FENTANYL CITRATE 0.05 MG/ML IJ SOLN: 50.0000 ug | INTRAMUSCULAR | Status: DC | PRN

## 2013-01-19 MED ORDER — LIDOCAINE-EPINEPHRINE 1 %-1:100000 IJ SOLN
INTRAMUSCULAR | Status: AC
  Filled 2013-01-19: qty 1

## 2013-01-19 MED ORDER — CIPROFLOXACIN IN D5W 400 MG/200ML IV SOLN
400.0000 mg | INTRAVENOUS | Status: AC
  Administered 2013-01-19: 400 mg via INTRAVENOUS

## 2013-01-19 SURGICAL SUPPLY — 91 items
BAG DECANTER FOR FLEXI CONT (MISCELLANEOUS) ×5 IMPLANT
BANDAGE GAUZE ELAST BULKY 4 IN (GAUZE/BANDAGES/DRESSINGS) ×10 IMPLANT
BINDER BREAST LRG (GAUZE/BANDAGES/DRESSINGS) ×5 IMPLANT
BINDER BREAST MEDIUM (GAUZE/BANDAGES/DRESSINGS) IMPLANT
BINDER BREAST XLRG (GAUZE/BANDAGES/DRESSINGS) ×5 IMPLANT
BINDER BREAST XXLRG (GAUZE/BANDAGES/DRESSINGS) IMPLANT
BIOPATCH RED 1 DISK 7.0 (GAUZE/BANDAGES/DRESSINGS) IMPLANT
BLADE HEX COATED 2.75 (ELECTRODE) ×5 IMPLANT
BLADE SURG 10 STRL SS (BLADE) IMPLANT
BLADE SURG 15 STRL LF DISP TIS (BLADE) ×8 IMPLANT
BLADE SURG 15 STRL SS (BLADE) ×2
CANISTER LINER 1300 C W/ELBOW (MISCELLANEOUS) ×5 IMPLANT
CANISTER LIPO FAT HARVEST (MISCELLANEOUS) ×5 IMPLANT
CANISTER SUCT 1200ML W/VALVE (MISCELLANEOUS) ×5 IMPLANT
CANNULA ASPIRATION (CANNULA) ×5 IMPLANT
CHLORAPREP W/TINT 26ML (MISCELLANEOUS) ×15 IMPLANT
CORDS BIPOLAR (ELECTRODE) IMPLANT
COVER MAYO STAND STRL (DRAPES) ×10 IMPLANT
COVER TABLE BACK 60X90 (DRAPES) ×10 IMPLANT
DECANTER SPIKE VIAL GLASS SM (MISCELLANEOUS) ×5 IMPLANT
DERMABOND ADVANCED (GAUZE/BANDAGES/DRESSINGS) ×2
DERMABOND ADVANCED .7 DNX12 (GAUZE/BANDAGES/DRESSINGS) ×8 IMPLANT
DRAIN CHANNEL 19F RND (DRAIN) IMPLANT
DRAPE LAPAROSCOPIC ABDOMINAL (DRAPES) ×5 IMPLANT
DRAPE PED LAPAROTOMY (DRAPES) ×5 IMPLANT
DRSG PAD ABDOMINAL 8X10 ST (GAUZE/BANDAGES/DRESSINGS) ×20 IMPLANT
DRSG TEGADERM 2-3/8X2-3/4 SM (GAUZE/BANDAGES/DRESSINGS) IMPLANT
ELECT BLADE 4.0 EZ CLEAN MEGAD (MISCELLANEOUS) ×5
ELECT COATED BLADE 2.86 ST (ELECTRODE) ×5 IMPLANT
ELECT REM PT RETURN 9FT ADLT (ELECTROSURGICAL) ×10
ELECTRODE BLDE 4.0 EZ CLN MEGD (MISCELLANEOUS) ×4 IMPLANT
ELECTRODE REM PT RTRN 9FT ADLT (ELECTROSURGICAL) ×8 IMPLANT
EVACUATOR SILICONE 100CC (DRAIN) IMPLANT
FILTER LIPOSUCTION (MISCELLANEOUS) ×10 IMPLANT
GAUZE SPONGE 4X4 12PLY STRL LF (GAUZE/BANDAGES/DRESSINGS) IMPLANT
GLOVE BIO SURGEON STRL SZ 6.5 (GLOVE) ×30 IMPLANT
GLOVE BIO SURGEON STRL SZ7 (GLOVE) ×10 IMPLANT
GLOVE BIOGEL PI IND STRL 7.5 (GLOVE) ×4 IMPLANT
GLOVE BIOGEL PI INDICATOR 7.5 (GLOVE) ×1
GLOVE SURG SS PI 7.0 STRL IVOR (GLOVE) ×5 IMPLANT
GOWN PREVENTION PLUS XLARGE (GOWN DISPOSABLE) ×25 IMPLANT
IMPL BREAST P5.3XHI PRFL RND (Breast) ×4 IMPLANT
IMPL BRST P5.3XHI PRFL RND (Breast) ×4 IMPLANT
IMPL GEL HP 150CC (Breast) ×4 IMPLANT
IMPLANT GEL HP 150CC (Breast) ×5 IMPLANT
IMPLANT SILICONE 475CC (Breast) ×1 IMPLANT
IV NS 1000ML (IV SOLUTION)
IV NS 1000ML BAXH (IV SOLUTION) IMPLANT
IV NS 500ML (IV SOLUTION)
IV NS 500ML BAXH (IV SOLUTION) IMPLANT
KIT FILL SYSTEM UNIVERSAL (SET/KITS/TRAYS/PACK) ×5 IMPLANT
NDL SAFETY ECLIPSE 18X1.5 (NEEDLE) ×4 IMPLANT
NEEDLE HYPO 18GX1.5 SHARP (NEEDLE) ×1
NEEDLE HYPO 25X1 1.5 SAFETY (NEEDLE) ×10 IMPLANT
NEEDLE SPNL 18GX3.5 QUINCKE PK (NEEDLE) ×5 IMPLANT
NS IRRIG 1000ML POUR BTL (IV SOLUTION) ×5 IMPLANT
PACK BASIN DAY SURGERY FS (CUSTOM PROCEDURE TRAY) ×10 IMPLANT
PAD ALCOHOL SWAB (MISCELLANEOUS) ×5 IMPLANT
PENCIL BUTTON HOLSTER BLD 10FT (ELECTRODE) ×10 IMPLANT
PIN SAFETY STERILE (MISCELLANEOUS) IMPLANT
SIZER BREAST 450CC (SIZER) ×1
SIZER BREAST REUSE 150CC (SIZER) ×5
SIZER BRST P5.1XHI 450CC (SIZER) ×4 IMPLANT
SIZER BRST REUSE 150CC (SIZER) ×4 IMPLANT
SIZER GENERIC MENTOR (SIZER) ×10 IMPLANT
SLEEVE SCD COMPRESS KNEE MED (MISCELLANEOUS) ×10 IMPLANT
SPONGE GAUZE 4X4 12PLY (GAUZE/BANDAGES/DRESSINGS) IMPLANT
SPONGE LAP 18X18 X RAY DECT (DISPOSABLE) ×10 IMPLANT
STRIP CLOSURE SKIN 1/2X4 (GAUZE/BANDAGES/DRESSINGS) ×5 IMPLANT
SUT MNCRL AB 4-0 PS2 18 (SUTURE) ×20 IMPLANT
SUT MON AB 3-0 SH 27 (SUTURE) ×1
SUT MON AB 3-0 SH27 (SUTURE) ×4 IMPLANT
SUT MON AB 4-0 PC3 18 (SUTURE) ×5 IMPLANT
SUT MON AB 5-0 PS2 18 (SUTURE) ×10 IMPLANT
SUT PDS AB 2-0 CT2 27 (SUTURE) IMPLANT
SUT SILK 3 0 PS 1 (SUTURE) IMPLANT
SUT VIC AB 3-0 SH 27 (SUTURE) ×3
SUT VIC AB 3-0 SH 27X BRD (SUTURE) ×12 IMPLANT
SUT VICRYL 4-0 PS2 18IN ABS (SUTURE) ×5 IMPLANT
SYR 20CC LL (SYRINGE) IMPLANT
SYR 50ML LL SCALE MARK (SYRINGE) ×5 IMPLANT
SYR BULB IRRIGATION 50ML (SYRINGE) ×5 IMPLANT
SYR CONTROL 10ML LL (SYRINGE) ×10 IMPLANT
SYR TB 1ML LL NO SAFETY (SYRINGE) IMPLANT
SYRINGE TOOMEY DISP (SYRINGE) IMPLANT
TOWEL OR 17X24 6PK STRL BLUE (TOWEL DISPOSABLE) ×15 IMPLANT
TOWEL OR NON WOVEN STRL DISP B (DISPOSABLE) ×5 IMPLANT
TUBE CONNECTING 20X1/4 (TUBING) ×5 IMPLANT
TUBING SET GRADUATE ASPIR 12FT (MISCELLANEOUS) ×5 IMPLANT
UNDERPAD 30X30 INCONTINENT (UNDERPADS AND DIAPERS) ×10 IMPLANT
YANKAUER SUCT BULB TIP NO VENT (SUCTIONS) ×5 IMPLANT

## 2013-01-19 NOTE — Anesthesia Preprocedure Evaluation (Addendum)
Anesthesia Evaluation  Patient identified by MRN, date of birth, ID band Patient awake    Reviewed: Allergy & Precautions, H&P , NPO status , Patient's Chart, lab work & pertinent test results  Airway Mallampati: I TM Distance: >3 FB Neck ROM: Full    Dental   Pulmonary          Cardiovascular + dysrhythmias Supra Ventricular Tachycardia     Neuro/Psych    GI/Hepatic   Endo/Other    Renal/GU      Musculoskeletal   Abdominal   Peds  Hematology   Anesthesia Other Findings   Reproductive/Obstetrics                           Anesthesia Physical Anesthesia Plan  ASA: III  Anesthesia Plan: General   Post-op Pain Management:    Induction: Intravenous  Airway Management Planned: LMA  Additional Equipment:   Intra-op Plan:   Post-operative Plan: Extubation in OR  Informed Consent: I have reviewed the patients History and Physical, chart, labs and discussed the procedure including the risks, benefits and alternatives for the proposed anesthesia with the patient or authorized representative who has indicated his/her understanding and acceptance.     Plan Discussed with: CRNA and Surgeon  Anesthesia Plan Comments:         Anesthesia Quick Evaluation

## 2013-01-19 NOTE — Op Note (Signed)
Preoperative diagnosis: breast cancer status post chemotherapy and no longer needs venous access Postoperative diagnosis: Same as above  Procedure: Right subclavian port removal Surgeon: Dr. Harden Mo Anesthesia: Gen. Medications: None Estimated blood loss: Minimal Specimens: None Case turned over to Dr. Kelly Splinter at the completion  Indications: This is a 55 yof who i know from her care for breast cancer. She no longer needs her venous access. She has plastic surgery planned for today and I agreed to remove her port at the same time.  Procedure: After informed consent was obtained the patient was taken to the operating room. She was given antibiotics per plastic surgery. She has sequential compression devices on her legs and she underwent general anesthetic without complication. Her right chest was prepped and draped in the standard sterile surgical fashion. A surgical timeout was then performed.  I infiltrated Marcaine overlying her port. I reentered her old incision. I removed her port as well as the line in their entirety. I removed all the stitch material. Hemostasis was observed. I closed this with 3-0 Vicryl, 4-0 Monocryl, and Dermabond. The case was then turned over to Dr. Kelly Splinter.

## 2013-01-19 NOTE — H&P (View-Only) (Signed)
 This document contains confidential information from a [facility] medical record system and may be unauthenticated. Release may be made only with a valid authorization or in accordance with applicable policies of Medical Center or its affiliates. This document must be maintained in a secure manner or discarded/destroyed as required by Medical Center policy or by a confidential means such as shredding.     Marie Anderson  01/03/2013 11:00 AM   Office Visit  MRN:  [id number]   Department: [facility]  Dept Phone: [phone]   Description: Female DOB: 11/14/1945  Provider: Claire Sanger, DO   Diagnoses    Acquired absence of left breast and nipple    -  Primary   V45.71      Reason for Visit -  Breast Reconstruction     Vitals - Last Recorded    135/80  85  1.651 m (5' 5")  88.451 kg (195 lb)  32.45 kg/m2  98%     Subjective:     Patient ID: Marie Anderson is a 67 y.o. female.  HPI The patient is a 67 yo wf here for her history and physical for secondary left breast reconstruction. She underwent a left mastectomy with immediate reconstruction with Expander and ADM. She is ready for the implant placement. She was diagnosed with left sided breast cancer in October 2013. She had primary chemotherapy with her last dose of taxotere 03/2012. Her MRI showed a very positive response with a decrease in the tumor sizes the clips still remain 6.8 cm apart. She is G3P2 and breast feed her two children. She is 5 feet 5 inches tall and 182 pounds. Her bra size was 38 C and she is ok with a B size cup. She did receive radiation. She has 430 cc in the left implant. The right side has grade III ptosis and may have volume loss in the upper pole even after it has been lifted.  The following portions of the patient's history were reviewed and updated as appropriate: allergies, current medications, past family history, past medical history, past social history, past surgical history  and problem list.  Review of Systems  Constitutional: Negative.   HENT: Negative.   Eyes: Negative.   Respiratory: Negative.   Cardiovascular: Negative.   Gastrointestinal: Negative.   Endocrine: Negative.   Genitourinary: Negative.   Neurological: Negative.   Hematological: Negative.   Psychiatric/Behavioral: Negative.       Objective:   Physical Exam  Constitutional: She is oriented to person, place, and time. She appears well-developed and well-nourished.  HENT:   Head: Normocephalic and atraumatic.  Eyes: Conjunctivae and EOM are normal. Pupils are equal, round, and reactive to light.  Cardiovascular: Normal rate.   Pulmonary/Chest: Effort normal.  Abdominal: Soft.  Musculoskeletal: Normal range of motion.  Neurological: She is alert and oriented to person, place, and time.  Skin: Skin is warm.  Psychiatric: She has a normal mood and affect. Her behavior is normal. Judgment and thought content normal.      Assessment:     1.  Acquired absence of left breast and nipple      Plan:     We are planning on a left expander removal and placed of an implant with right mastopexy and possible implant placement. The consent was obtained with risks and complications reviewed which included bleeding, pain, scar, infection and the risk of anesthesia.  The patients questions were answered to the patients expressed satisfaction.    Medications Ordered This Encounter    ciprofloxacin (CIPRO) 500 MG tablet 21 tablet 0 01/03/2013      Take 1 tablet (500 mg total) by mouth 2 times daily. - Oral    HYDROcodone-acetaminophen (NORCO) 5-325 mg per tablet 30 tablet 0 01/03/2013 01/13/2013    Take 1 tablet by mouth every 6 (six) hours as needed for up to 10 days for Pain. - Oral    diazepam (VALIUM) 2 MG tablet 20 tablet 0 01/03/2013 01/13/2013    Take 1 tablet (2 mg total) by mouth every 6 (six) hours as needed for up to 10 days for Anxiety. - Oral    ondansetron (ZOFRAN, AS HYDROCHLORIDE,) 4 MG  tablet 10 tablet 0 01/03/2013 01/10/2013    Take 1 tablet (4 mg total) by mouth every 8 (eight) hours as needed for up to 7 days for Nausea / Vomiting. - Oral     Discontinued Medications      HYDROcodone-acetaminophen (NORCO) 5-325 mg per tablet         

## 2013-01-19 NOTE — Op Note (Signed)
Op report Bilateral Exchange   DATE OF OPERATION: 01/19/2013  LOCATION: [facility]  SURGICAL DIVISION: Plastic Surgery  PREOPERATIVE DIAGNOSES:  1. History of breast cancer.  2. Acquired absence of Left breast.  3. Breast asymmetry after surgery for breast cancer.  POSTOPERATIVE DIAGNOSES:  1. Left breast expander removal and placement of a silicone implant. 2. Right breast mastopexy with implant placement for symmetry. 3. Lipofilling of left breast for contour abnormalities.  PROCEDURE:  1. History of breast cancer.  2. Acquired absence of Left breast.  3. Breast asymmetry after surgery for breast cancer.  SURGEON: Wayland Denis, DO  ASSISTANT: Shawn Rayburn, PA  ANESTHESIA:  General.   COMPLICATIONS: None.   IMPLANTS: Left - Mentor Smooth Round High Profile Gel 475cc. Ref #[id number].  Serial Number [id number] Right - Mentor Smooth Round High Profile Gel 150cc. Ref #[id number].  Serial Number [id number]  INDICATIONS FOR PROCEDURE:  The patient, Marie Anderson, is a 67 y.o. female born on [birth year], is here for treatment after a left mastectomy.  She had a tissue expander placed at the time of the mastectomy. She now presents for exchange of her expander for an implant.  She requires capsulotomies to better position the implants.   CONSENT:  Informed consent was obtained directly from the patient. Risks, benefits and alternatives were fully discussed. Specific risks including but not limited to bleeding, infection, hematoma, seroma, scarring, pain, implant infection, implant extrusion, capsular contracture, asymmetry, wound healing problems, and need for further surgery were all discussed. The patient did have an ample opportunity to have her questions answered to her satisfaction.   DESCRIPTION OF PROCEDURE:  The patient was taken to the operating room. SCDs were placed and IV antibiotics were given. The patient's chest was prepped and draped in a sterile fashion. A time  out was performed and the implants to be used were identified.  One percent Xylocaine with epinephrine was used to infiltrate the area.   The old mastectomy scar was opened and superior mastectomy and inferior mastectomy flaps were re-raised over the pectoralis major muscle. The pectoralis was split to expose the tissue expander which was removed. Inspection of the pocket showed a normal healthy capsule and good integration of the biologic matrix.  Circumferential capsulotomies were performed for breast pocket expansion.  Measurements were made on either side to confirm adequate pocket size for the implant dimensions.  Hemostasis was ensured.  Gloves were changed. The implant was placed in the pockets and oriented appropriately. This was done after sizers were placed to confirm size selection.  Attention was turned to the right side to complete before the left was closed.  This was done to achieve the best size symmetry as possible.  The right breast was lifted with a vertical reduction type incision.  This was done to help with the contour and sustainability of the mastopexy.  The sizer was used to select size and the 150 cc silicone implant was placed under the pectoralis muscle after a pocket was created.  Hemostasis was achieved with electrocautery. The pillars were brought together with 3-0 Monocryl and then the deep layers were closed with 4-0 Monocryl followed by 5-0 monocryl.  The nipple was rotated into placed and closed first with 4-0 Monocryl followed by 5-0 Monocryl. The pockets on both sides were irrigated with antibiotic solution. The left pectoralis major muscle and capsule on the anterior surface were re-closed with a 3-0 running Vicryl suture. The remaining skin was closed with 4-0 Monocryl deep dermal and  5-0 Monocryl subcuticular stitches. Dermabond and ABDs were applied to both breast incisions followed by a breast binder.  The patient was allowed to wake from anesthesia and taken to the  recovery room in satisfactory condition.

## 2013-01-19 NOTE — Transfer of Care (Signed)
Immediate Anesthesia Transfer of Care Note  Patient: Marie Anderson  Procedure(s) Performed: Procedure(s): REMOVAL OF LEFT TISSUE EXPANDERS WITH PLACEMENT OF LEFT BREAST IMPLANT (Left) RIGHT BREAST PLACEMENT OF IMPLANT WITH MASTOPEXY (Right) LIPOSUCTION WITH LIPOFILLING (N/A) REMOVAL PORT-A-CATH (Right)  Patient Location: PACU  Anesthesia Type:General  Level of Consciousness: awake  Airway & Oxygen Therapy: Patient Spontanous Breathing and Patient connected to face mask oxygen  Post-op Assessment: Report given to PACU RN and Post -op Vital signs reviewed and stable  Post vital signs: Reviewed and stable  Complications: No apparent anesthesia complications

## 2013-01-19 NOTE — Anesthesia Postprocedure Evaluation (Signed)
Anesthesia Post Note  Patient: Marie Anderson  Procedure(s) Performed: Procedure(s) (LRB): REMOVAL OF LEFT TISSUE EXPANDERS WITH PLACEMENT OF LEFT BREAST IMPLANT (Left) RIGHT BREAST PLACEMENT OF IMPLANT WITH MASTOPEXY (Right) LIPOSUCTION WITH LIPOFILLING (N/A) REMOVAL PORT-A-CATH (Right)  Anesthesia type: general  Patient location: PACU  Post pain: Pain level controlled  Post assessment: Patient's Cardiovascular Status Stable  Last Vitals:  Filed Vitals:   01/19/13 1452  BP: 160/90  Pulse: 75  Temp: 36.8 C  Resp: 16    Post vital signs: Reviewed and stable  Level of consciousness: sedated  Complications: No apparent anesthesia complications

## 2013-01-19 NOTE — H&P (Signed)
This document contains confidential information from a [facility] medical record system and may be unauthenticated. Release may be made only with a valid authorization or in accordance with applicable policies of Medical Center or its affiliates. This document must be maintained in a secure manner or discarded/destroyed as required by Medical Center policy or by a confidential means such as shredding.     Marie Anderson  01/03/2013 11:00 AM   Office Visit  MRN:  [id number]   Department: [facility]  Dept Phone: [phone]   Description: Female DOB: 1945/04/07  Provider: Wayland Denis, DO   Diagnoses    Acquired absence of left breast and nipple    -  Primary   V45.71      Reason for Visit -  Breast Reconstruction     Vitals - Last Recorded    135/80  85  1.651 m (5\' 5" )  88.451 kg (195 lb)  32.45 kg/m2  98%     Subjective:     Patient ID: Marie Anderson is a 67 y.o. female.  HPI The patient is a 67 yo wf here for her history and physical for secondary left breast reconstruction. She underwent a left mastectomy with immediate reconstruction with Expander and ADM. She is ready for the implant placement. She was diagnosed with left sided breast cancer in October 2013. She had primary chemotherapy with her last dose of taxotere 03/2012. Her MRI showed a very positive response with a decrease in the tumor sizes the clips still remain 6.8 cm apart. She is G3P2 and breast feed her two children. She is 5 feet 5 inches tall and 182 pounds. Her bra size was 38 C and she is ok with a B size cup. She did receive radiation. She has 430 cc in the left implant. The right side has grade III ptosis and may have volume loss in the upper pole even after it has been lifted.  The following portions of the patient's history were reviewed and updated as appropriate: allergies, current medications, past family history, past medical history, past social history, past surgical history  and problem list.  Review of Systems  Constitutional: Negative.   HENT: Negative.   Eyes: Negative.   Respiratory: Negative.   Cardiovascular: Negative.   Gastrointestinal: Negative.   Endocrine: Negative.   Genitourinary: Negative.   Neurological: Negative.   Hematological: Negative.   Psychiatric/Behavioral: Negative.       Objective:   Physical Exam  Constitutional: She is oriented to person, place, and time. She appears well-developed and well-nourished.  HENT:   Head: Normocephalic and atraumatic.  Eyes: Conjunctivae and EOM are normal. Pupils are equal, round, and reactive to light.  Cardiovascular: Normal rate.   Pulmonary/Chest: Effort normal.  Abdominal: Soft.  Musculoskeletal: Normal range of motion.  Neurological: She is alert and oriented to person, place, and time.  Skin: Skin is warm.  Psychiatric: She has a normal mood and affect. Her behavior is normal. Judgment and thought content normal.      Assessment:     1.  Acquired absence of left breast and nipple      Plan:     We are planning on a left expander removal and placed of an implant with right mastopexy and possible implant placement. The consent was obtained with risks and complications reviewed which included bleeding, pain, scar, infection and the risk of anesthesia.  The patients questions were answered to the patients expressed satisfaction.  Medications Ordered This Encounter    ciprofloxacin (CIPRO) 500 MG tablet 21 tablet 0 01/03/2013      Take 1 tablet (500 mg total) by mouth 2 times daily. - Oral    HYDROcodone-acetaminophen (NORCO) 5-325 mg per tablet 30 tablet 0 01/03/2013 01/13/2013    Take 1 tablet by mouth every 6 (six) hours as needed for up to 10 days for Pain. - Oral    diazepam (VALIUM) 2 MG tablet 20 tablet 0 01/03/2013 01/13/2013    Take 1 tablet (2 mg total) by mouth every 6 (six) hours as needed for up to 10 days for Anxiety. - Oral    ondansetron (ZOFRAN, AS HYDROCHLORIDE,) 4 MG  tablet 10 tablet 0 01/03/2013 01/10/2013    Take 1 tablet (4 mg total) by mouth every 8 (eight) hours as needed for up to 7 days for Nausea / Vomiting. - Oral     Discontinued Medications      HYDROcodone-acetaminophen (NORCO) 5-325 mg per tablet

## 2013-01-19 NOTE — Interval H&P Note (Signed)
History and Physical Interval Note:  01/19/2013 7:36 AM  Marie Anderson  has presented today for surgery, with the diagnosis of BREAST CANCER ACQUIRED ABSENCE OF LEFT BREAST AND NIPPLE   The various methods of treatment have been discussed with the patient and family. After consideration of risks, benefits and other options for treatment, the patient has consented to  Procedure(s): REMOVAL OF LEFT TISSUE EXPANDERS WITH PLACEMENT OF LEFT BREAST IMPLANT (Left) RIGHT BREAST PLACEMENT OF IMPLANT WITH MASTOPEXY (Right) LIPOSUCTION WITH LIPOFILLING (N/A) REMOVAL PORT-A-CATH (N/A) as a surgical intervention .  The patient's history has been reviewed, patient examined, no change in status, stable for surgery.  I have reviewed the patient's chart and labs.  Questions were answered to the patient's satisfaction.     SANGER,CLAIRE

## 2013-01-19 NOTE — Anesthesia Procedure Notes (Signed)
Procedure Name: LMA Insertion Date/Time: 01/19/2013 8:57 AM Performed by: York Grice Pre-anesthesia Checklist: Patient identified, Emergency Drugs available, Suction available and Patient being monitored Patient Re-evaluated:Patient Re-evaluated prior to inductionOxygen Delivery Method: Circle System Utilized Preoxygenation: Pre-oxygenation with 100% oxygen Intubation Type: IV induction Ventilation: Mask ventilation without difficulty LMA: LMA inserted LMA Size: 4.0 Number of attempts: 1 Airway Equipment and Method: bite block Placement Confirmation: positive ETCO2 Tube secured with: Tape Dental Injury: Teeth and Oropharynx as per pre-operative assessment

## 2013-01-19 NOTE — Brief Op Note (Signed)
01/19/2013  11:27 AM  PATIENT:  Marie Anderson  67 y.o. female  PRE-OPERATIVE DIAGNOSIS:  BREAST CANCER ACQUIRED ABSENCE OF LEFT BREAST AND NIPPLE   POST-OPERATIVE DIAGNOSIS:  BREAST CANCER ACQUIRED ABSENCE OF LEFT BREAST AND NIPPLE   PROCEDURE:  Procedure(s): REMOVAL OF LEFT TISSUE EXPANDERS WITH PLACEMENT OF LEFT BREAST IMPLANT (Left) RIGHT BREAST PLACEMENT OF IMPLANT WITH MASTOPEXY (Right) LIPOSUCTION WITH LIPOFILLING (N/A) REMOVAL PORT-A-CATH (Right)  SURGEON:  Surgeon(s) and Role: Panel 1:    * Claire Sanger, DO - Primary  Panel 2:    * Emelia Loron, MD - Primary  PHYSICIAN ASSISTANT: Shawn Rayburn, PA  ASSISTANTS: none   ANESTHESIA:   general  EBL:  Total I/O In: 1500 [I.V.:1500] Out: -   BLOOD ADMINISTERED:none  DRAINS: none   LOCAL MEDICATIONS USED:  MARCAINE     SPECIMEN:  Source of Specimen:  left mastectomy scar  DISPOSITION OF SPECIMEN:  PATHOLOGY  COUNTS:  YES  TOURNIQUET:  * No tourniquets in log *  DICTATION: .Dragon Dictation  PLAN OF CARE: Discharge to home after PACU  PATIENT DISPOSITION:  PACU - hemodynamically stable.   Delay start of Pharmacological VTE agent (>24hrs) due to surgical blood loss or risk of bleeding: no

## 2013-01-20 ENCOUNTER — Encounter (HOSPITAL_BASED_OUTPATIENT_CLINIC_OR_DEPARTMENT_OTHER): Payer: Self-pay | Admitting: Plastic Surgery

## 2013-01-23 ENCOUNTER — Telehealth (INDEPENDENT_AMBULATORY_CARE_PROVIDER_SITE_OTHER): Payer: Self-pay | Admitting: *Deleted

## 2013-01-23 NOTE — Telephone Encounter (Signed)
LMOM for pt to return my call.  I was calling to check on pt and to inform her of her post op appt with Dr. Dwain Sarna on 12/12 at 11:00am.

## 2013-01-24 DIAGNOSIS — Z9889 Other specified postprocedural states: Secondary | ICD-10-CM | POA: Insufficient documentation

## 2013-02-06 ENCOUNTER — Ambulatory Visit (HOSPITAL_COMMUNITY)
Admission: RE | Admit: 2013-02-06 | Discharge: 2013-02-06 | Disposition: A | Discharge status: Home / Self Care | Payer: Medicare Other | Source: Ambulatory Visit | Attending: Obstetrics and Gynecology | Admitting: Obstetrics and Gynecology

## 2013-02-06 ENCOUNTER — Ambulatory Visit (HOSPITAL_BASED_OUTPATIENT_CLINIC_OR_DEPARTMENT_OTHER)
Admission: RE | Admit: 2013-02-06 | Discharge: 2013-02-06 | Disposition: A | Discharge status: Home / Self Care | Payer: Medicare Other | Source: Ambulatory Visit | Attending: Obstetrics and Gynecology | Admitting: Obstetrics and Gynecology

## 2013-02-06 ENCOUNTER — Encounter (HOSPITAL_COMMUNITY): Payer: Self-pay

## 2013-02-06 VITALS — BP 114/70 | HR 63 | Wt 192.8 lb

## 2013-02-06 DIAGNOSIS — C50212 Malignant neoplasm of upper-inner quadrant of left female breast: Secondary | ICD-10-CM

## 2013-02-06 DIAGNOSIS — C50919 Malignant neoplasm of unspecified site of unspecified female breast: Secondary | ICD-10-CM | POA: Insufficient documentation

## 2013-02-06 DIAGNOSIS — I519 Heart disease, unspecified: Secondary | ICD-10-CM

## 2013-02-06 DIAGNOSIS — I359 Nonrheumatic aortic valve disorder, unspecified: Secondary | ICD-10-CM | POA: Insufficient documentation

## 2013-02-06 NOTE — Progress Notes (Signed)
Patient ID: Marie Anderson, female   DOB: 1945/08/26, 67 y.o.   MRN: [id number] Oncologist: Dr Welton Flakes  General Surgeon: Dr Dwain Sarna  Plastic Surgeon: Dr Kelly Splinter  Cardiologist: Dr [place]  HPI: Marie Anderson is a 67 yo woman with L breast cancer 12/2011 ER positive PR positive HER-2/neu positive, s/p L Mastectomy, SVT (on metolprolol for 10 years last episode in 01/2012 received Adenosine) and no coronary disease. She was  referred to Cardio-Onc clinic by Dr Welton Flakes.   She received neoadjuvant chemotherapy consisting of Perjeta, Herceptin, and Taxotere. Chemotherapy regimen was given every 21 days for a total of 4 cycles. She completed XRT. She finished Herceptin until 12/2012.  ECHO 12/25/11 EF 60% Lateral S' 9.8  ECHO 06/01/2012 EF 60% Lateral S' 9.7  ECHO 10/10/2012 EF 60% Lateral S' 9.6 ECHO 02/06/13 EF 60%     Lateral S' 9.1 cm/a  She returns for post Herceptin follow up. She completed Herceptin 01/12/2013. Denies SOB or edema. Doing well.   ROS: All systems negative except as listed in HPI, PMH and Problem List.  Past Medical History  Diagnosis Date  . Anemia   . SVT (supraventricular tachycardia)   . Breast cancer 12/04/11    left- inv ductal ca, DCIS, ER/PR +, HER 2 +  . History of chemotherapy 12/31/11 -03/11/12    s/p 4 cycles  . History of radiation therapy 05/04/12-06/20/12    left breast/    Current Outpatient Prescriptions  Medication Sig Dispense Refill  . anastrozole (ARIMIDEX) 1 MG tablet Take 1 mg by mouth daily.      . calcium carbonate (OS-CAL) 600 MG TABS tablet Take 1,200 mg by mouth daily with breakfast.      . cholecalciferol (VITAMIN D) 1000 UNITS tablet Take 1,000 Units by mouth daily.      . diazepam (VALIUM) 2 MG tablet Take 2 mg by mouth as needed.       Marland Kitchen HYDROcodone-acetaminophen (NORCO/VICODIN) 5-325 MG per tablet       . metoprolol succinate (TOPROL-XL) 100 MG 24 hr tablet Take 1 tablet (100 mg total) by mouth daily.  90 tablet  3  . non-metallic deodorant (ALRA)  MISC Apply 1 application topically daily as needed.       No current facility-administered medications for this encounter.    PHYSICAL EXAM: Filed Vitals:   02/06/13 1016  BP: 114/70  Pulse: 63  Weight: 192 lb 12.8 oz (87.454 kg)  SpO2: 100%    General:  Well appearing. No resp difficulty HEENT: normal Neck: supple. JVP flat. Carotids 2+ bilaterally; no bruits. No lymphadenopathy or thryomegaly appreciated. Cor: PMI normal. Regular rate & rhythm. No rubs, gallops or murmurs. Lungs: clear Abdomen: soft, nontender, nondistended. No hepatosplenomegaly. No bruits or masses. Good bowel sounds. Extremities: no cyanosis, clubbing, rash, edema Neuro: alert & orientedx3, cranial nerves grossly intact. Moves all 4 extremities w/o difficulty. Affect pleasant.   ASSESSMENT & PLAN:  1) Breast Cancer She completed Herceptin 01/12/13 . Dr Gala Romney discussed and reviewed  ECHO. EF and doppler parameters stable. Follow up as needed.   CLEGG,AMY NP-C 10:23 AM  Patient seen and examined with Tonye Becket, NP. We discussed all aspects of the encounter. I agree with the assessment and plan as stated above. I reviewed echos personally. EF and Doppler parameters stable. No HF on exam. She has finished Herceptin. Can f/u with [place] PRN.  Beulah Matusek,MD 10:39 AM

## 2013-02-06 NOTE — Patient Instructions (Signed)
Follow up as needed

## 2013-02-06 NOTE — Progress Notes (Signed)
  Echocardiogram 2D Echocardiogram has been performed.  Marie Anderson 02/06/2013, 10:06 AM

## 2013-02-10 ENCOUNTER — Encounter (INDEPENDENT_AMBULATORY_CARE_PROVIDER_SITE_OTHER): Payer: Self-pay | Admitting: General Surgery

## 2013-02-10 ENCOUNTER — Ambulatory Visit (INDEPENDENT_AMBULATORY_CARE_PROVIDER_SITE_OTHER): Payer: Medicare Other | Admitting: General Surgery

## 2013-02-10 VITALS — BP 128/82 | HR 72 | Temp 98.0°F | Resp 18 | Ht 64.0 in | Wt 194.0 lb

## 2013-02-10 DIAGNOSIS — Z09 Encounter for follow-up examination after completed treatment for conditions other than malignant neoplasm: Secondary | ICD-10-CM

## 2013-02-10 NOTE — Progress Notes (Signed)
Subjective:     Patient ID: Marie Anderson, female   DOB: 09-18-1945, 67 y.o.   MRN: [id number]  HPI 48 yof who I know from treatment for breast cancer who recently had her port removed while undergoing reconstruction.  She returns today without complaints  Review of Systems     Objective:   Physical Exam Healing right chest incision without infection    Assessment:     S/p port removal     Plan:     F/u one year for cancer follow up Return to full activity

## 2013-02-25 ENCOUNTER — Other Ambulatory Visit: Payer: Self-pay | Admitting: Adult Health

## 2013-04-05 ENCOUNTER — Telehealth: Payer: Self-pay | Admitting: Oncology

## 2013-04-05 ENCOUNTER — Other Ambulatory Visit: Payer: Self-pay | Admitting: *Deleted

## 2013-04-05 DIAGNOSIS — C50219 Malignant neoplasm of upper-inner quadrant of unspecified female breast: Secondary | ICD-10-CM

## 2013-04-05 NOTE — Telephone Encounter (Signed)
, °

## 2013-04-10 ENCOUNTER — Ambulatory Visit: Payer: Medicare Other | Attending: Oncology | Admitting: Physical Therapy

## 2013-04-10 DIAGNOSIS — IMO0001 Reserved for inherently not codable concepts without codable children: Secondary | ICD-10-CM | POA: Insufficient documentation

## 2013-04-10 DIAGNOSIS — M25519 Pain in unspecified shoulder: Secondary | ICD-10-CM | POA: Insufficient documentation

## 2013-04-10 DIAGNOSIS — Z853 Personal history of malignant neoplasm of breast: Secondary | ICD-10-CM | POA: Insufficient documentation

## 2013-04-10 DIAGNOSIS — M24519 Contracture, unspecified shoulder: Secondary | ICD-10-CM | POA: Insufficient documentation

## 2013-05-03 ENCOUNTER — Ambulatory Visit: Payer: Medicare Other | Attending: Oncology

## 2013-05-03 DIAGNOSIS — IMO0001 Reserved for inherently not codable concepts without codable children: Secondary | ICD-10-CM | POA: Insufficient documentation

## 2013-05-03 DIAGNOSIS — M24519 Contracture, unspecified shoulder: Secondary | ICD-10-CM | POA: Insufficient documentation

## 2013-05-03 DIAGNOSIS — Z853 Personal history of malignant neoplasm of breast: Secondary | ICD-10-CM | POA: Insufficient documentation

## 2013-05-03 DIAGNOSIS — M25519 Pain in unspecified shoulder: Secondary | ICD-10-CM | POA: Insufficient documentation

## 2013-05-08 ENCOUNTER — Ambulatory Visit: Payer: Medicare Other | Admitting: Physical Therapy

## 2013-05-10 ENCOUNTER — Encounter: Payer: Medicare Other | Admitting: Physical Therapy

## 2013-05-11 ENCOUNTER — Ambulatory Visit: Payer: Medicare Other | Admitting: Physical Therapy

## 2013-05-13 ENCOUNTER — Other Ambulatory Visit: Payer: Self-pay | Admitting: Cardiology

## 2013-05-16 ENCOUNTER — Ambulatory Visit: Payer: Medicare Other

## 2013-05-17 ENCOUNTER — Ambulatory Visit: Payer: Medicare Other | Admitting: Physical Therapy

## 2013-05-17 ENCOUNTER — Other Ambulatory Visit: Payer: Self-pay | Admitting: Cardiology

## 2013-05-18 ENCOUNTER — Telehealth: Payer: Self-pay | Admitting: Oncology

## 2013-05-18 NOTE — Telephone Encounter (Signed)
, °

## 2013-05-19 ENCOUNTER — Encounter: Payer: Medicare Other | Admitting: Physical Therapy

## 2013-05-24 ENCOUNTER — Ambulatory Visit: Payer: Medicare Other | Admitting: Physical Therapy

## 2013-05-25 ENCOUNTER — Ambulatory Visit (INDEPENDENT_AMBULATORY_CARE_PROVIDER_SITE_OTHER): Payer: Medicare Other | Admitting: Cardiology

## 2013-05-25 ENCOUNTER — Encounter: Payer: Self-pay | Admitting: Cardiology

## 2013-05-25 VITALS — BP 122/64 | HR 79 | Ht 64.0 in | Wt 202.2 lb

## 2013-05-25 DIAGNOSIS — I471 Supraventricular tachycardia: Secondary | ICD-10-CM

## 2013-05-25 DIAGNOSIS — I498 Other specified cardiac arrhythmias: Secondary | ICD-10-CM

## 2013-05-25 NOTE — Patient Instructions (Signed)
Continue your current therapy  I will see you in one year   

## 2013-05-25 NOTE — Progress Notes (Signed)
Marie Anderson Date of Birth: 01-Oct-1945 Medical Record #[id number]  History of Present Illness: Marie Anderson is seen for  followup. She has a history of supraventricular tachycardia that has been well controlled with metoprolol. She has been diagnosed with breast cancer. She had a left mastectomy with reconstruction. She is a chemotherapy with Taxotere and now Herceptin. She was followed by Dr. Haroldine Laws and serial Echos have been normal. Her last episode of SVT was in November 2013. She has had no recurrent palpitations since that time. She does complain of a lack of energy and stamina. She has gained almost 20 lbs in the last 2 years.  Current Outpatient Prescriptions on File Prior to Visit  Medication Sig Dispense Refill  . anastrozole (ARIMIDEX) 1 MG tablet TAKE 1 TABLET (1 MG TOTAL) BY MOUTH DAILY.  30 tablet  6  . calcium carbonate (OS-CAL) 600 MG TABS tablet Take 1,200 mg by mouth daily with breakfast.      . cholecalciferol (VITAMIN D) 1000 UNITS tablet Take by mouth daily.       . diazepam (VALIUM) 2 MG tablet Take 2 mg by mouth as needed.       . fluocinonide-emollient (LIDEX-E) 0.05 % cream Apply 1 application topically as needed.       Marland Kitchen HYDROcodone-acetaminophen (NORCO/VICODIN) 5-325 MG per tablet Take 1 tablet by mouth as needed.       . metoprolol succinate (TOPROL-XL) 100 MG 24 hr tablet TAKE 1 TABLET BY MOUTH EVERY DAY  90 tablet  3  . non-metallic deodorant (ALRA) MISC Apply 1 application topically daily as needed.       No current facility-administered medications on file prior to visit.    Allergies  Allergen Reactions  . Cephalosporins     headache  . Codeine Nausea Only    Past Medical History  Diagnosis Date  . Anemia   . SVT (supraventricular tachycardia)   . Breast cancer 12/04/11    left- inv ductal ca, DCIS, ER/PR +, HER 2 +  . History of chemotherapy 12/31/11 -03/11/12    s/p 4 cycles  . History of radiation therapy 05/04/12-06/20/12    left breast/     Past Surgical History  Procedure Laterality Date  . Dilation and curettage of uterus    . Eye surgery      age 33-lt eye growth  . Colonoscopy    . Portacath placement  12/23/2011    Procedure: INSERTION PORT-A-CATH;  Surgeon: Rolm Bookbinder, MD;  Location: [facility];  Service: General;  Laterality: Right;  . Mastectomy w/ sentinel node biopsy  04/06/2012    Procedure: MASTECTOMY WITH SENTINEL LYMPH NODE BIOPSY;  Surgeon: Rolm Bookbinder, MD;  Location: [place];  Service: General;  Laterality: Left;  left mastectomy, left axillary sentinel node biopsy  . Breast reconstruction with placement of tissue expander and flex hd (acellular hydrated dermis)  04/06/2012    Procedure: BREAST RECONSTRUCTION WITH PLACEMENT OF TISSUE EXPANDER AND FLEX HD (ACELLULAR HYDRATED DERMIS);  Surgeon: Theodoro Kos, DO;  Location: [place];  Service: Plastics;  Laterality: Left;  IMMEDIATE LEFT BREAST RECONSTRUCTION WITH PLACEMENT OF TISSUE EXPANDER AND ALLODERM  . Removal of tissue expander and placement of implant Left 01/19/2013    Procedure: REMOVAL OF LEFT TISSUE EXPANDERS WITH PLACEMENT OF LEFT BREAST IMPLANT;  Surgeon: Theodoro Kos, DO;  Location: [place];  Service: Plastics;  Laterality: Left;  Marland Kitchen Mastopexy Right 01/19/2013    Procedure: RIGHT  BREAST PLACEMENT OF IMPLANT WITH MASTOPEXY;  Surgeon: Theodoro Kos, DO;  Location: [facility];  Service: Plastics;  Laterality: Right;  . Liposuction with lipofilling N/A 01/19/2013    Procedure: LIPOSUCTION WITH LIPOFILLING;  Surgeon: Theodoro Kos, DO;  Location: [place];  Service: Plastics;  Laterality: N/A;  . Port-a-cath removal Right 01/19/2013    Procedure: REMOVAL PORT-A-CATH;  Surgeon: Rolm Bookbinder, MD;  Location: [place];  Service: General;  Laterality: Right;    History  Smoking status  . Never Smoker   Smokeless tobacco  .  Never Used    History  Alcohol Use  . Yes    Family History  Problem Relation Age of Onset  . Stroke Mother   . Cancer Father     prostate    Review of Systems: As noted in history of present illness.  All other systems were reviewed and are negative.  Physical Exam: BP 122/64  Pulse 79  Ht 5\' 4"  (1.626 m)  Wt 202 lb 3.2 oz (91.717 kg)  BMI 34.69 kg/m2 WDWF in NAD. The HEENT exam is normal.  The carotids are 2+ without bruits.  There is no thyromegaly.  There is no JVD.  The lungs are clear.  She has had left breast reconstruction. The heart exam reveals a regular rate with a normal S1 and S2.  There are no murmurs, gallops, or rubs.  The PMI is not displaced.   Abdominal exam reveals good bowel sounds.  There is no hepatosplenomegaly or tenderness.  There are no masses.  Exam of the legs reveal no clubbing, cyanosis, or edema.  The legs are without rashes.  The distal pulses are intact.  Cranial nerves II - XII are intact.  Motor and sensory functions are intact.  The gait is normal.  LABORATORY DATA: Echo: 02/06/13:Study Conclusions  - Left ventricle: The cavity size was normal. Wall thickness was increased in a pattern of mild LVH. Systolic function was normal. The estimated ejection fraction was in the range of 55% to 60%. Wall motion was normal; there were no regional wall motion abnormalities. Features are consistent with a pseudonormal left ventricular filling pattern, with concomitant abnormal relaxation and increased filling pressure (grade 2 diastolic dysfunction). - Aortic valve: Trivial regurgitation. - Impressions: Lateral s' 9.1 cm/s. GLS - 21.2% Impressions:  - Lateral s' 9.1 cm/s. GLS - 21.2%  Ecg today: NSR, normal Ecg  Assessment / Plan: 1. Supraventricular tachycardia. Well controlled on metoprolol. If she were to have frequent breakthrough we could consider ablative therapy. We will continue on her current medication for now.  2. Breast cancer  status post mastectomy. s/p chemotherapy with Herceptin. Echocardiogram shows normal LV function. This will be monitored per protocol.

## 2013-05-26 ENCOUNTER — Ambulatory Visit: Payer: Medicare Other | Admitting: Physical Therapy

## 2013-05-31 ENCOUNTER — Ambulatory Visit: Payer: Medicare Other | Attending: Oncology | Admitting: Physical Therapy

## 2013-05-31 DIAGNOSIS — M25519 Pain in unspecified shoulder: Secondary | ICD-10-CM | POA: Insufficient documentation

## 2013-05-31 DIAGNOSIS — M24519 Contracture, unspecified shoulder: Secondary | ICD-10-CM | POA: Insufficient documentation

## 2013-05-31 DIAGNOSIS — Z853 Personal history of malignant neoplasm of breast: Secondary | ICD-10-CM | POA: Insufficient documentation

## 2013-05-31 DIAGNOSIS — IMO0001 Reserved for inherently not codable concepts without codable children: Secondary | ICD-10-CM | POA: Insufficient documentation

## 2013-06-07 ENCOUNTER — Ambulatory Visit: Payer: Medicare Other | Admitting: Physical Therapy

## 2013-06-08 ENCOUNTER — Ambulatory Visit: Payer: Medicare Other

## 2013-06-14 ENCOUNTER — Ambulatory Visit: Payer: Medicare Other | Admitting: Physical Therapy

## 2013-06-21 ENCOUNTER — Ambulatory Visit: Payer: Medicare Other

## 2013-06-28 ENCOUNTER — Ambulatory Visit: Payer: Medicare Other | Admitting: Physical Therapy

## 2013-07-03 ENCOUNTER — Telehealth: Payer: Self-pay

## 2013-07-03 NOTE — Telephone Encounter (Signed)
Orders for PT signed and faxed to [facility].  Sent to scan.

## 2013-07-04 ENCOUNTER — Ambulatory Visit: Payer: Medicare Other | Attending: Oncology | Admitting: Physical Therapy

## 2013-07-04 DIAGNOSIS — Z853 Personal history of malignant neoplasm of breast: Secondary | ICD-10-CM | POA: Insufficient documentation

## 2013-07-04 DIAGNOSIS — M25519 Pain in unspecified shoulder: Secondary | ICD-10-CM | POA: Insufficient documentation

## 2013-07-04 DIAGNOSIS — IMO0001 Reserved for inherently not codable concepts without codable children: Secondary | ICD-10-CM | POA: Insufficient documentation

## 2013-07-04 DIAGNOSIS — M24519 Contracture, unspecified shoulder: Secondary | ICD-10-CM | POA: Insufficient documentation

## 2013-07-06 ENCOUNTER — Ambulatory Visit (HOSPITAL_BASED_OUTPATIENT_CLINIC_OR_DEPARTMENT_OTHER): Payer: Medicare Other | Admitting: Adult Health

## 2013-07-06 ENCOUNTER — Telehealth: Payer: Self-pay | Admitting: Adult Health

## 2013-07-06 ENCOUNTER — Other Ambulatory Visit (HOSPITAL_BASED_OUTPATIENT_CLINIC_OR_DEPARTMENT_OTHER): Payer: Medicare Other

## 2013-07-06 ENCOUNTER — Encounter: Payer: Self-pay | Admitting: Adult Health

## 2013-07-06 VITALS — BP 132/84 | HR 76 | Temp 98.2°F | Resp 18 | Ht 64.0 in | Wt 203.6 lb

## 2013-07-06 DIAGNOSIS — C50919 Malignant neoplasm of unspecified site of unspecified female breast: Secondary | ICD-10-CM

## 2013-07-06 DIAGNOSIS — Z170 Estrogen receptor positive status [ER+]: Secondary | ICD-10-CM

## 2013-07-06 DIAGNOSIS — C50912 Malignant neoplasm of unspecified site of left female breast: Secondary | ICD-10-CM

## 2013-07-06 DIAGNOSIS — C50219 Malignant neoplasm of upper-inner quadrant of unspecified female breast: Secondary | ICD-10-CM

## 2013-07-06 DIAGNOSIS — I890 Lymphedema, not elsewhere classified: Secondary | ICD-10-CM

## 2013-07-06 LAB — COMPREHENSIVE METABOLIC PANEL (CC13)
ALT: 12 U/L (ref 0–55)
AST: 17 U/L (ref 5–34)
Albumin: 3.9 g/dL (ref 3.5–5.0)
Alkaline Phosphatase: 82 U/L (ref 40–150)
Anion Gap: 11 mEq/L (ref 3–11)
BUN: 9.4 mg/dL (ref 7.0–26.0)
CALCIUM: 9.3 mg/dL (ref 8.4–10.4)
CHLORIDE: 109 meq/L (ref 98–109)
CO2: 27 mEq/L (ref 22–29)
CREATININE: 0.8 mg/dL (ref 0.6–1.1)
Glucose: 96 mg/dl (ref 70–140)
Potassium: 3.8 mEq/L (ref 3.5–5.1)
Sodium: 147 mEq/L — ABNORMAL HIGH (ref 136–145)
Total Bilirubin: 0.6 mg/dL (ref 0.20–1.20)
Total Protein: 7.1 g/dL (ref 6.4–8.3)

## 2013-07-06 LAB — CBC WITH DIFFERENTIAL/PLATELET
BASO%: 0.3 % (ref 0.0–2.0)
BASOS ABS: 0 10*3/uL (ref 0.0–0.1)
EOS%: 2 % (ref 0.0–7.0)
Eosinophils Absolute: 0.1 10*3/uL (ref 0.0–0.5)
HCT: 41.3 % (ref 34.8–46.6)
HEMOGLOBIN: 13.6 g/dL (ref 11.6–15.9)
LYMPH%: 27.9 % (ref 14.0–49.7)
MCH: 32.4 pg (ref 25.1–34.0)
MCHC: 32.9 g/dL (ref 31.5–36.0)
MCV: 98.3 fL (ref 79.5–101.0)
MONO#: 0.3 10*3/uL (ref 0.1–0.9)
MONO%: 8.6 % (ref 0.0–14.0)
NEUT#: 1.8 10*3/uL (ref 1.5–6.5)
NEUT%: 61.2 % (ref 38.4–76.8)
PLATELETS: 76 10*3/uL — AB (ref 145–400)
RBC: 4.2 10*6/uL (ref 3.70–5.45)
RDW: 13.9 % (ref 11.2–14.5)
WBC: 3 10*3/uL — ABNORMAL LOW (ref 3.9–10.3)
lymph#: 0.8 10*3/uL — ABNORMAL LOW (ref 0.9–3.3)

## 2013-07-06 MED ORDER — DOXYCYCLINE HYCLATE 100 MG PO TABS: 100.0000 mg | ORAL_TABLET | Freq: Two times a day (BID) | ORAL | Status: DC

## 2013-07-06 MED ORDER — ANASTROZOLE 1 MG PO TABS: 1.0000 mg | ORAL_TABLET | Freq: Every day | ORAL | Status: DC

## 2013-07-06 NOTE — Telephone Encounter (Signed)
, °

## 2013-07-06 NOTE — Progress Notes (Signed)
[place]  Telephone:([phone]) [phone] Fax:(336) [phone]     ID: Marie Anderson OB: 21-Jan-1946  MR#: [id number]  RXV#:[id number]  PCP: Darlyn Chamber, MD GYN:  Dr. Radene Knee SU: Dr. Rolm Bookbinder OTHER MD:  Dr. Thea Silversmith: radiation oncology, Dr. Lonia Skinner: plastic surgery.    CHIEF COMPLAINT:68 year old [place], [place] woman with invasive ductal carcinoma of the left breast ER positive PR positive HER-2/neu positive with Ki-67 94% diagnosed in 12/2011.  BREAST CANCER HISTORY:  1. The patient palpated a left breast mass, and underwent a mammogram and ultrasound that demonstrated a lesion and satellite lesion.  Biopsy was done and demonstrated a HER-2 positive ER/PR positive breast cancer of the left breast at the 12:00 position. She also had a second area at 3:00 that was ER/PR positive but HER-2/neu negative. The patient elected to proceed with neoadjuvant treatment followed by left mastectomy, reconstruction, adjuvant radiation, adjuvant Herceptin, and Arimidex daily.  Her detailed treatment is below.    CURRENT THERAPY: Arimidex 64m daily  INTERVAL HISTORY:  Marie Anderson here today for follow up of her left breast cancer.  She is taking Arimidex daily and tolerating it moderately well.  She does have some mild joint aches that are tolerable.  She is concerned due to the area above her implants that is erythematous, swollen, and painful.  She underwent evaluation by Dr. SMigdalia Dkregarding this who determined it was fat necrosis and massage was recommended.  The patient has gone to get the area massaged which did help, but the pain and swelling remains.  [place].  She does have left arm lymphedema and is going to the lymphedema clinic.  She has ordered a sleeve and glove and is planning to wear it [place]  She is otherwise doing well and a 10 point ROS is  negative.  We reviewed her health maintenance below.    REVIEW OF SYSTEMS: A 10 point review of systems was conducted and is otherwise negative except for what is noted above.     PAST MEDICAL HISTORY: Past Medical History  Diagnosis Date  . Anemia   . SVT (supraventricular tachycardia)   . Breast cancer 12/04/11    left- inv ductal ca, DCIS, ER/PR +, HER 2 +  . History of chemotherapy 12/31/11 -03/11/12    s/p 4 cycles  . History of radiation therapy 05/04/12-06/20/12    left breast/    PAST SURGICAL HISTORY: Past Surgical History  Procedure Laterality Date  . Dilation and curettage of uterus    . Eye surgery      age 24-lt eye growth  . Colonoscopy    . Portacath placement  12/23/2011    Procedure: INSERTION PORT-A-CATH;  Surgeon: MRolm Bookbinder MD;  [place] [facility]  Service: General;  Laterality: Right;  . Mastectomy w/ sentinel node biopsy  04/06/2012    Procedure: MASTECTOMY WITH SENTINEL LYMPH NODE BIOPSY;  Surgeon: MRolm Bookbinder MD;  [place] [facility]  Service: General;  Laterality: Left;  left mastectomy, left axillary sentinel node biopsy  . Breast reconstruction with placement of tissue expander and flex hd (acellular hydrated dermis)  04/06/2012    Procedure: BREAST RECONSTRUCTION WITH PLACEMENT OF TISSUE EXPANDER AND FLEX HD (ACELLULAR HYDRATED DERMIS);  Surgeon: CTheodoro Kos DO;  [place]  Service: Plastics;  Laterality: Left;  IMMEDIATE LEFT BREAST RECONSTRUCTION  WITH PLACEMENT OF TISSUE EXPANDER AND ALLODERM  . Removal of tissue expander and placement of implant Left 01/19/2013    Procedure: REMOVAL OF LEFT TISSUE EXPANDERS WITH PLACEMENT OF LEFT BREAST IMPLANT;  Surgeon: Theodoro Kos, DO;  Location: [place];  Service: Plastics;  Laterality: Left;  Marland Kitchen Mastopexy Right 01/19/2013    Procedure: RIGHT BREAST PLACEMENT OF IMPLANT WITH MASTOPEXY;  Surgeon: Theodoro Kos, DO;  Location:  [place];  Service: Plastics;  Laterality: Right;  . Liposuction with lipofilling N/A 01/19/2013    Procedure: LIPOSUCTION WITH LIPOFILLING;  Surgeon: Theodoro Kos, DO;  Location: [place];  Service: Plastics;  Laterality: N/A;  . Port-a-cath removal Right 01/19/2013    Procedure: REMOVAL PORT-A-CATH;  Surgeon: Rolm Bookbinder, MD;  Location: [place];  Service: General;  Laterality: Right;    FAMILY HISTORY Family History  Problem Relation Age of Onset  . Stroke Mother   . Cancer Father     prostate    GYNECOLOGIC HISTORY: menarche at age 44, G67 P2, No history of HRT therapy  SOCIAL HISTORY:  Lives with fiance Marie Anderson in a house.  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     ADVANCED DIRECTIVES:  Not in place.  HEALTH MAINTENANCE: History  Substance Use Topics  . Smoking status: Never Smoker   . Smokeless tobacco: Never Used  . Alcohol Use: Yes   Mammogram: 11/2012 Colonoscopy: 2007, 10 year f/u recommended Bone Density Scan: 11/2012, normal Pap Smear: 11/2012 Eye Exam: due Vitamin D Level: by PCP Lipid Panel: by PCP    Allergies  Allergen Reactions  . Cephalosporins     headache  . Codeine Nausea Only    Current Outpatient Prescriptions  Medication Sig Dispense Refill  . anastrozole (ARIMIDEX) 1 MG tablet Take 1 tablet (1 mg total) by mouth daily.  90 tablet  6  . calcium carbonate (OS-CAL) 600 MG TABS tablet Take 1,200 mg by mouth daily with breakfast.      . cholecalciferol (VITAMIN D) 1000 UNITS tablet Take by mouth daily.       . metoprolol succinate (TOPROL-XL) 100 MG 24 hr tablet TAKE 1 TABLET BY MOUTH EVERY DAY  90 tablet  3  . non-metallic deodorant (ALRA) MISC Apply 1 application topically daily as needed.      . doxycycline (VIBRA-TABS) 100 MG tablet Take 1 tablet (100 mg total) by mouth 2 (two) times daily.  28 tablet  0  . fluocinonide-emollient (LIDEX-E) 0.05 % cream Apply 1  application topically as needed.       Marland Kitchen HYDROcodone-acetaminophen (NORCO/VICODIN) 5-325 MG per tablet Take 1 tablet by mouth as needed.        No current facility-administered medications for this visit.    OBJECTIVE: Filed Vitals:   07/06/13 1042  BP: 132/84  Pulse: 76  Temp: 98.2 F (36.8 C)  Resp: 18     Body mass index is 34.93 kg/(m^2).     GENERAL: Patient is a well appearing female in no acute distress HEENT:  Sclerae anicteric.  Oropharynx clear and moist. No ulcerations or evidence of oropharyngeal candidiasis. Neck is supple.  NODES:  No cervical, supraclavicular, or axillary lymphadenopathy palpated.  BREAST EXAM:  Left breast s/p reconstruction, area of erythema above the implant.  Swelling is present, area is tender to palpation. Right breast no masses or lesions, benign bilateral breast exam.   LUNGS:  Clear to auscultation bilaterally.  No  wheezes or rhonchi. HEART:  Regular rate and rhythm. No murmur appreciated. ABDOMEN:  Soft, nontender.  Positive, normoactive bowel sounds. No organomegaly palpated. MSK:  No focal spinal tenderness to palpation. Full range of motion bilaterally in the upper extremities. EXTREMITIES:  No peripheral edema.   SKIN:  Clear with no obvious rashes or skin changes. No nail dyscrasia. NEURO:  Nonfocal. Well oriented.  Appropriate affect. ECOG FS:1 - Symptomatic but completely ambulatory     LAB RESULTS:  CMP     Component Value Date/Time   NA 147* 07/06/2013 1027   NA 143 03/31/2012 1200   K 3.8 07/06/2013 1027   K 3.7 03/31/2012 1200   CL 109* 08/19/2012 1021   CL 107 03/31/2012 1200   CO2 27 07/06/2013 1027   CO2 29 03/31/2012 1200   GLUCOSE 96 07/06/2013 1027   GLUCOSE 91 08/19/2012 1021   GLUCOSE 92 03/31/2012 1200   BUN 9.4 07/06/2013 1027   BUN 7 03/31/2012 1200   CREATININE 0.8 07/06/2013 1027   CREATININE 0.67 03/31/2012 1200   CALCIUM 9.3 07/06/2013 1027   CALCIUM 8.6 03/31/2012 1200   PROT 7.1 07/06/2013 1027   ALBUMIN 3.9 07/06/2013  1027   AST 17 07/06/2013 1027   ALT 12 07/06/2013 1027   ALKPHOS 82 07/06/2013 1027   BILITOT 0.60 07/06/2013 1027   GFRNONAA >90 03/31/2012 1200   GFRAA >90 03/31/2012 1200    I No results found for this basename: SPEP,  UPEP,   kappa and lambda light chains    Lab Results  Component Value Date   WBC 3.0* 07/06/2013   NEUTROABS 1.8 07/06/2013   HGB 13.6 07/06/2013   HCT 41.3 07/06/2013   MCV 98.3 07/06/2013   PLT 76* 07/06/2013      Chemistry      Component Value Date/Time   NA 147* 07/06/2013 1027   NA 143 03/31/2012 1200   K 3.8 07/06/2013 1027   K 3.7 03/31/2012 1200   CL 109* 08/19/2012 1021   CL 107 03/31/2012 1200   CO2 27 07/06/2013 1027   CO2 29 03/31/2012 1200   BUN 9.4 07/06/2013 1027   BUN 7 03/31/2012 1200   CREATININE 0.8 07/06/2013 1027   CREATININE 0.67 03/31/2012 1200      Component Value Date/Time   CALCIUM 9.3 07/06/2013 1027   CALCIUM 8.6 03/31/2012 1200   ALKPHOS 82 07/06/2013 1027   AST 17 07/06/2013 1027   ALT 12 07/06/2013 1027   BILITOT 0.60 07/06/2013 1027       Lab Results  Component Value Date   LABCA2 70* 12/16/2011    No components found with this basename: LABCA125    No results found for this basename: INR,  in the last 168 hours  Urinalysis No results found for this basename: colorurine,  appearanceur,  labspec,  phurine,  glucoseu,  hgbur,  bilirubinur,  ketonesur,  proteinur,  urobilinogen,  nitrite,  leukocytesur    STUDIES: No results found.  ASSESSMENT: 68 y.o. [place], [place] woman with clinical at 12 o'clock T2 Nx, stage IIA/IIB invasive ductal carcinoma of the left breast ER positive PR positive HER-2/neu positive with Ki-67 94% diagnosed in 12/2011.  She also had at 3 oclock clinical T1Nx, invasive ductal carcinoma, ER positive, PR positive, Ki-67 of 24%, HER-2/neu negative.    1. The patient underwent 4 cycles of neoadjuvant Docetaxel, Trastuzumab, and Pertuzumab given every 21 days administered from 12/31/2011 to 03/11/2012.   2. The  patient had  MRI of the breasts performed on 03/07/2012 that revealed good response to neoadjuvant treatment. She was referred to Dr. Rolm Bookbinder for definitive surgery.   3. The patient underwent a left mastectomy and sentinel lymph node biopsy on 04/06/2012. This showed a 0.8 cm grade 1 invasive ductal carcinoma with associated low-grade ductal carcinoma in situ. One sentinel lymph node had a micrometastatic focus of ductal carcinoma. Treatment effect was noted within the primary lesion however nests of ductal carcinoma involving the lymphatics were still present. The lymph node shows no evidence of tumor effect. The margins were widely negative. Her tumor was estrogen and progesterone receptor 100% positive. She did have immediate reconstruction with a expander placed at the time of her mastectomy.   4.  Patient completed adjuvant radiation therapy from 05/04/12 to 06/20/12.   5. Patient did complete adjuvant every 3 week Trastuzumab on 01/12/13.  She finished one calendar year of therapy.   6. Patient was started on adjuvant Arimidex anti-estrogen therapy starting 07/29/12. She is tolerating this well.   PLAN: Marie Anderson is here today for follow up and evaluation of her left breast cancer.  She continues to take Arimidex daily and is tolerating it well.  She has no sign of recurrence and will continue Armidex daily.  I refilled this for her today.  She does have lymphadenopathy that is improving and her lymphedema sleeve has been ordered.  She knows to wear it on the plane ride.  She also has this area of fat necrosis above her left breast implant.  I would really like for her to return next week and let us re-evaluate it and r/o cellulitis, and that is what I recommended.  However, she is going to [place], and is not going to return next week for re-evaluation.  Due to this, I prescribed Doxycycline for her to take, and we gave her a copy of her medical records to take to [place] in case she  needs to seek medical care while traveling.  She does know to seek medical care if the area worsens in any way, shape or form.  We reviewed her health maintenance above and the patient is up to date.  I did recommend she schedule an eye exam.  We discussed survivorship in detail.  I recommended healthy diet, exercise, and self breast exams.    The patient will return in 3 months for labs and evaluation.   She knows to call us in the interim for any questions or concerns.  We can certainly see her sooner if needed.  This patient, assessment, and plan was reviewed with Dr. Jana Hakim in detail.    I spent 25 minutes counseling the patient face to face.  The total time spent in the appointment was 30 minutes.  Minette Headland, [place] [phone] 07/08/2013 9:18 AM

## 2013-07-06 NOTE — Progress Notes (Deleted)
[place]  [phone] Fax:(336) [phone]  OFFICE PROGRESS NOTE  PATIENT: Marie Anderson   DOB: 07/12/1945  MR#: [id number]  DUK#:[id number]  BJ:SEGBTD,VVOH S, MD Dr. Kathyrn Lass  Dr. Rolm Bookbinder  Dr. Thea Silversmith  DIAGNOSIS:  68 year old [place], [place] woman with invasive mammary carcinoma of the left breast ER positive PR positive HER-2/neu positive with Ki-67 94% diagnosed in 12/2011.   PRIOR THERAPY: 1.  The patient was originally seen in the [facility] on 12/16/2011. She was diagnosed with stage II a HER-2 positive ER/PR positive breast cancer of the left breast at the 3:00 position. She also had a second area that was ER/PR positive but HER-2/neu negative. Patient is interested in breast conservation.   2.  The patient is now status post 4 cycles of Herceptin progenitor and Taxotere given every 21 days administered from 12/31/2011 to 03/11/2012.   3.  The patient had MRI of the breasts performed on 03/07/2012 that revealed good response to neoadjuvant treatment. She was referred to Dr. Rolm Bookbinder for definitive surgery.   4.  The patient underwent a left mastectomy and sentinel lymph node biopsy on 04/06/2012. This showed a 0.8 cm grade 1 invasive ductal carcinoma with associated low-grade ductal carcinoma in situ. One sentinel lymph node had a micrometastatic focus of ductal carcinoma. Treatment effect was noted within the primary lesion however nests of ductal carcinoma involving the lymphatics were still present. The lymph node shows no evidence of tumor effect. The margins were widely negative. Her tumor was estrogen and progesterone receptor 100% positive. She did have immediate reconstruction with a expander placed at the time of her mastectomy.  5.Radiation therapy from 05/04/12 to 06/20/12.  6. Every three week Herceptin beginning 04/15/12.  We did hold one dose on 05/27/12.  She completed this therapy on  01/12/14.  7. Arimidex starting 07/29/12.   CURRENT THERAPY:  Arimidex daily.  INTERVAL HISTORY: Ms. Cake returns s.    PAST MEDICAL HISTORY: Past Medical History  Diagnosis Date  . Anemia   . SVT (supraventricular tachycardia)   . Breast cancer 12/04/11    left- inv ductal ca, DCIS, ER/PR +, HER 2 +  . History of chemotherapy 12/31/11 -03/11/12    s/p 4 cycles  . History of radiation therapy 05/04/12-06/20/12    left breast/    PAST SURGICAL HISTORY: Past Surgical History  Procedure Laterality Date  . Dilation and curettage of uterus    . Eye surgery      age 48-lt eye growth  . Colonoscopy    . Portacath placement  12/23/2011    Procedure: INSERTION PORT-A-CATH;  Surgeon: Rolm Bookbinder, MD;  Location: [facility];  Service: General;  Laterality: Right;  . Mastectomy w/ sentinel node biopsy  04/06/2012    Procedure: MASTECTOMY WITH SENTINEL LYMPH NODE BIOPSY;  Surgeon: Rolm Bookbinder, MD;  Location: [facility];  Service: General;  Laterality: Left;  left mastectomy, left axillary sentinel node biopsy  . Breast reconstruction with placement of tissue expander and flex hd (acellular hydrated dermis)  04/06/2012    Procedure: BREAST RECONSTRUCTION WITH PLACEMENT OF TISSUE EXPANDER AND FLEX HD (ACELLULAR HYDRATED DERMIS);  Surgeon: Theodoro Kos, DO;  Location: [facility];  Service: Plastics;  Laterality: Left;  IMMEDIATE LEFT BREAST RECONSTRUCTION WITH PLACEMENT OF TISSUE EXPANDER AND ALLODERM  . Removal of tissue expander and placement of implant Left 01/19/2013    Procedure: REMOVAL OF LEFT TISSUE EXPANDERS WITH  PLACEMENT OF LEFT BREAST IMPLANT;  Surgeon: Theodoro Kos, DO;  Location: [facility];  Service: Plastics;  Laterality: Left;  Marland Kitchen Mastopexy Right 01/19/2013    Procedure: RIGHT BREAST PLACEMENT OF IMPLANT WITH MASTOPEXY;  Surgeon: Theodoro Kos, DO;  Location: [facility];  Service: Plastics;   Laterality: Right;  . Liposuction with lipofilling N/A 01/19/2013    Procedure: LIPOSUCTION WITH LIPOFILLING;  Surgeon: Theodoro Kos, DO;  Location: [facility];  Service: Plastics;  Laterality: N/A;  . Port-a-cath removal Right 01/19/2013    Procedure: REMOVAL PORT-A-CATH;  Surgeon: Rolm Bookbinder, MD;  Location: [facility];  Service: General;  Laterality: Right;     FAMILY HISTORY: Family History  Problem Relation Age of Onset  . Stroke Mother   . Cancer Father     prostate    SOCIAL HISTORY: History  Substance Use Topics  . Smoking status: Never Smoker   . Smokeless tobacco: Never Used  . Alcohol Use: Yes    ALLERGIES: Allergies  Allergen Reactions  . Cephalosporins     headache  . Codeine Nausea Only     MEDICATIONS:  Current Outpatient Prescriptions  Medication Sig Dispense Refill  . anastrozole (ARIMIDEX) 1 MG tablet TAKE 1 TABLET (1 MG TOTAL) BY MOUTH DAILY.  30 tablet  6  . calcium carbonate (OS-CAL) 600 MG TABS tablet Take 1,200 mg by mouth daily with breakfast.      . cholecalciferol (VITAMIN D) 1000 UNITS tablet Take by mouth daily.       . metoprolol succinate (TOPROL-XL) 100 MG 24 hr tablet TAKE 1 TABLET BY MOUTH EVERY DAY  90 tablet  3  . non-metallic deodorant (ALRA) MISC Apply 1 application topically daily as needed.      . fluocinonide-emollient (LIDEX-E) 0.05 % cream Apply 1 application topically as needed.       Marland Kitchen HYDROcodone-acetaminophen (NORCO/VICODIN) 5-325 MG per tablet Take 1 tablet by mouth as needed.        No current facility-administered medications for this visit.      REVIEW OF SYSTEMS: A 10 point review of systems was completed and is negative except as noted above.    PHYSICAL EXAMINATION: BP 132/84  Pulse 76  Temp(Src) 98.2 F (36.8 C) (Oral)  Resp 18  Ht 5' 4" (1.626 m)  Wt 203 lb 9.6 oz (92.352 kg)  BMI 34.93 kg/m2  General appearance: Alert, cooperative, well nourished, no apparent  distress Head: Normocephalic, without obvious abnormality Eyes: Conjunctivae clear, PERRLA, EOMI Resp: Clear to auscultation bilaterally Cardio: Regular rate and rhythm, S1, S2 normal, no murmur, click, rub or gallop Breasts: Left breast expander present, no purulent discharge, no warmth or swelling, right breast no nipple inversion, no axilla fullness, benign breast exam GI: Soft, distended, non-tender, hypoactive bowel sounds, no organomegaly Extremities: Extremities normal, atraumatic, no cyanosis, left upper extremity lymphedema Lymph nodes: Cervical, supraclavicular, and axillary nodes normal Neurologic: Grossly normal  Skin: no rash or lesion.    ECOG FS:  Grade 1 - Symptomatic but completely ambulatory  LAB RESULTS: Lab Results  Component Value Date   WBC 3.0* 07/06/2013   NEUTROABS 1.8 07/06/2013   HGB 13.6 07/06/2013   HCT 41.3 07/06/2013   MCV 98.3 07/06/2013   PLT 76* 07/06/2013      Chemistry      Component Value Date/Time   NA 143 01/12/2013 0817   NA 143 03/31/2012 1200   K 3.9 01/12/2013 0817   K 3.7  03/31/2012 1200   CL 109* 08/19/2012 1021   CL 107 03/31/2012 1200   CO2 25 01/12/2013 0817   CO2 29 03/31/2012 1200   BUN 18.3 01/12/2013 0817   BUN 7 03/31/2012 1200   CREATININE 0.7 01/12/2013 0817   CREATININE 0.67 03/31/2012 1200      Component Value Date/Time   CALCIUM 8.8 01/12/2013 0817   CALCIUM 8.6 03/31/2012 1200   ALKPHOS 66 01/12/2013 0817   AST 11 01/12/2013 0817   ALT 14 01/12/2013 0817   BILITOT 0.47 01/12/2013 0817       Lab Results  Component Value Date   LABCA2 70* 12/16/2011    No components found with this basename: [id number]     RADIOGRAPHIC STUDIES: No results found.  ASSESSMENT: 68 y.o. with: 1.  Stage II invasive mammary carcinoma of the left breast that is ER positive PR positive HER-2/neu positive with Ki-67 94%. Patient was interested in breast conservation. She received neoadjuvant chemotherapy consisting of Perjeta, Herceptin, and  Taxotere. Chemotherapy regimen was given every 21 days for a total of 4 cycles.  MRI of the breasts on 03/07/2012 showed a good interval response to neoadjuvant chemotherapy.  The patient completed all of her chemotherapy on 03/04/2012.   2.  The patient underwent left mastectomy with sentinel lymph node biopsy on 04/06/2012. She had a 0.8 cm grade 1 invasive ductal carcinoma with associated low-grade ductal carcinoma in situ. One sentinel node micrometastatic focus of ductal carcinoma. Treatment effect was noted within the primary lesion however nests of ductal carcinoma involving the lymphatics were still present. Margins were negative tumor was ER/PR +100%. This was followed by immediate left breast reconstruction with expander placement.  Her implant placement and port removal is scheduled on 01/19/13.    3.  The patient completed radiation therapy under the care of Dr. Pablo Ledger on 06/20/12. She will also continue Herceptin infusions every 3 weeks for 1 year.  Following radiation, she was started on adjuvant arimidex daily and is tolerating it well.    4. Left upper extremity lymphedema  PLAN:  1.    2. Cardiac:  She was last evaluated by Dr. Haroldine Laws on 02/06/2013 with an echocardiogram.  Her EF and doppler was stable, she had no HF on exam.  She was instructed to f/u if needed.  3.  Patient will return in 6 months.   All questions were answered.  The patient was encouraged to contact us in the interim with any problems, questions or concerns.   I spent 25 minutes counseling the patient face to face.  The total time spent in the appointment was 30 minutes.  Minette Headland, [place] [phone]     07/06/2013, 11:01 AM

## 2013-07-07 ENCOUNTER — Ambulatory Visit: Payer: Medicare Other | Admitting: Physical Therapy

## 2013-07-13 ENCOUNTER — Other Ambulatory Visit: Payer: Medicare Other

## 2013-07-13 ENCOUNTER — Ambulatory Visit: Payer: Medicare Other | Admitting: Adult Health

## 2013-07-26 ENCOUNTER — Ambulatory Visit: Payer: Medicare Other | Admitting: Physical Therapy

## 2013-07-27 ENCOUNTER — Encounter: Payer: Medicare Other | Admitting: Physical Therapy

## 2013-08-02 ENCOUNTER — Ambulatory Visit: Payer: Medicare Other | Attending: Oncology | Admitting: Physical Therapy

## 2013-08-02 DIAGNOSIS — IMO0001 Reserved for inherently not codable concepts without codable children: Secondary | ICD-10-CM | POA: Insufficient documentation

## 2013-08-02 DIAGNOSIS — Z853 Personal history of malignant neoplasm of breast: Secondary | ICD-10-CM | POA: Insufficient documentation

## 2013-08-02 DIAGNOSIS — M25519 Pain in unspecified shoulder: Secondary | ICD-10-CM | POA: Insufficient documentation

## 2013-08-02 DIAGNOSIS — M24519 Contracture, unspecified shoulder: Secondary | ICD-10-CM | POA: Insufficient documentation

## 2013-08-04 ENCOUNTER — Ambulatory Visit: Payer: Medicare Other | Admitting: Physical Therapy

## 2013-08-09 ENCOUNTER — Ambulatory Visit: Payer: Medicare Other

## 2013-08-10 ENCOUNTER — Ambulatory Visit: Payer: Medicare Other | Admitting: Physical Therapy

## 2013-08-11 ENCOUNTER — Encounter: Payer: Medicare Other | Admitting: Physical Therapy

## 2013-08-16 ENCOUNTER — Ambulatory Visit: Payer: Medicare Other | Admitting: Physical Therapy

## 2013-08-18 ENCOUNTER — Ambulatory Visit: Payer: Medicare Other | Admitting: Physical Therapy

## 2013-08-23 ENCOUNTER — Ambulatory Visit: Payer: Medicare Other

## 2013-08-24 ENCOUNTER — Ambulatory Visit: Payer: Medicare Other | Admitting: Physical Therapy

## 2013-08-30 ENCOUNTER — Ambulatory Visit: Payer: Medicare Other | Attending: Oncology | Admitting: Physical Therapy

## 2013-08-30 DIAGNOSIS — IMO0001 Reserved for inherently not codable concepts without codable children: Secondary | ICD-10-CM | POA: Diagnosis present

## 2013-08-30 DIAGNOSIS — Z853 Personal history of malignant neoplasm of breast: Secondary | ICD-10-CM | POA: Diagnosis not present

## 2013-08-30 DIAGNOSIS — M25519 Pain in unspecified shoulder: Secondary | ICD-10-CM | POA: Insufficient documentation

## 2013-08-30 DIAGNOSIS — M24519 Contracture, unspecified shoulder: Secondary | ICD-10-CM | POA: Diagnosis not present

## 2013-09-04 ENCOUNTER — Telehealth: Payer: Self-pay

## 2013-09-04 NOTE — Telephone Encounter (Signed)
Faxed signed treatment plan to [facility] health outpatient rehab.  Sent to scan.

## 2013-09-06 ENCOUNTER — Ambulatory Visit: Payer: Medicare Other | Admitting: Physical Therapy

## 2013-09-06 DIAGNOSIS — IMO0001 Reserved for inherently not codable concepts without codable children: Secondary | ICD-10-CM | POA: Diagnosis not present

## 2013-09-13 ENCOUNTER — Encounter: Payer: Medicare Other | Admitting: Physical Therapy

## 2013-09-20 ENCOUNTER — Encounter: Payer: Medicare Other | Admitting: Physical Therapy

## 2013-09-21 ENCOUNTER — Encounter: Payer: Medicare Other | Admitting: Physical Therapy

## 2013-09-27 ENCOUNTER — Encounter: Payer: Medicare Other | Admitting: Physical Therapy

## 2013-09-28 ENCOUNTER — Encounter: Payer: Medicare Other | Admitting: Physical Therapy

## 2013-10-10 ENCOUNTER — Other Ambulatory Visit: Payer: Self-pay | Admitting: *Deleted

## 2013-10-10 DIAGNOSIS — C50219 Malignant neoplasm of upper-inner quadrant of unspecified female breast: Secondary | ICD-10-CM

## 2013-10-11 ENCOUNTER — Other Ambulatory Visit (HOSPITAL_BASED_OUTPATIENT_CLINIC_OR_DEPARTMENT_OTHER): Payer: Medicare Other

## 2013-10-11 ENCOUNTER — Encounter: Payer: Self-pay | Admitting: Adult Health

## 2013-10-11 ENCOUNTER — Telehealth: Payer: Self-pay | Admitting: Adult Health

## 2013-10-11 ENCOUNTER — Ambulatory Visit (HOSPITAL_BASED_OUTPATIENT_CLINIC_OR_DEPARTMENT_OTHER): Payer: Medicare Other | Admitting: Adult Health

## 2013-10-11 VITALS — BP 121/65 | HR 74 | Temp 97.7°F | Resp 18 | Ht 64.0 in | Wt 201.5 lb

## 2013-10-11 DIAGNOSIS — C50919 Malignant neoplasm of unspecified site of unspecified female breast: Secondary | ICD-10-CM

## 2013-10-11 DIAGNOSIS — C50219 Malignant neoplasm of upper-inner quadrant of unspecified female breast: Secondary | ICD-10-CM

## 2013-10-11 DIAGNOSIS — C50212 Malignant neoplasm of upper-inner quadrant of left female breast: Secondary | ICD-10-CM

## 2013-10-11 LAB — COMPREHENSIVE METABOLIC PANEL (CC13)
ALT: 12 U/L (ref 0–55)
AST: 18 U/L (ref 5–34)
Albumin: 3.8 g/dL (ref 3.5–5.0)
Alkaline Phosphatase: 69 U/L (ref 40–150)
Anion Gap: 9 mEq/L (ref 3–11)
BUN: 10.6 mg/dL (ref 7.0–26.0)
CALCIUM: 9.1 mg/dL (ref 8.4–10.4)
CHLORIDE: 106 meq/L (ref 98–109)
CO2: 28 mEq/L (ref 22–29)
CREATININE: 0.8 mg/dL (ref 0.6–1.1)
Glucose: 88 mg/dl (ref 70–140)
Potassium: 3.9 mEq/L (ref 3.5–5.1)
Sodium: 143 mEq/L (ref 136–145)
Total Bilirubin: 0.51 mg/dL (ref 0.20–1.20)
Total Protein: 7 g/dL (ref 6.4–8.3)

## 2013-10-11 LAB — CBC WITH DIFFERENTIAL/PLATELET
BASO%: 0.5 % (ref 0.0–2.0)
BASOS ABS: 0 10*3/uL (ref 0.0–0.1)
EOS ABS: 0 10*3/uL (ref 0.0–0.5)
EOS%: 1.6 % (ref 0.0–7.0)
HEMATOCRIT: 41.9 % (ref 34.8–46.6)
HGB: 13.6 g/dL (ref 11.6–15.9)
LYMPH#: 0.8 10*3/uL — AB (ref 0.9–3.3)
LYMPH%: 28.2 % (ref 14.0–49.7)
MCH: 32.3 pg (ref 25.1–34.0)
MCHC: 32.4 g/dL (ref 31.5–36.0)
MCV: 99.7 fL (ref 79.5–101.0)
MONO#: 0.2 10*3/uL (ref 0.1–0.9)
MONO%: 7.5 % (ref 0.0–14.0)
NEUT%: 62.2 % (ref 38.4–76.8)
NEUTROS ABS: 1.7 10*3/uL (ref 1.5–6.5)
Platelets: 78 10*3/uL — ABNORMAL LOW (ref 145–400)
RBC: 4.2 10*6/uL (ref 3.70–5.45)
RDW: 14.6 % — AB (ref 11.2–14.5)
WBC: 2.8 10*3/uL — ABNORMAL LOW (ref 3.9–10.3)

## 2013-10-11 NOTE — Telephone Encounter (Signed)
, °

## 2013-10-11 NOTE — Progress Notes (Signed)
[place]  Telephone:(336) [phone] Fax:(336) [phone]     ID: Marie Anderson OB: Jan 18, 1946  MR#: [id number]  TRR#:[id number]  PCP: Darlyn Chamber, MD GYN:  Marie Anderson SU: Marie Anderson OTHER MD:  Marie Anderson: radiation oncology, Marie Anderson: plastic surgery.    CHIEF COMPLAINT:68 year old Marie Anderson, Marie Anderson with invasive ductal carcinoma of the left breast ER positive PR positive HER-2/neu positive with Ki-67 94% diagnosed in 12/2011.  BREAST CANCER HISTORY:  1. The patient palpated a left breast mass, and underwent a mammogram and ultrasound that demonstrated a lesion and satellite lesion.  Biopsy was done and demonstrated a HER-2 positive ER/PR positive breast cancer of the left breast at the 12:00 position. She also had a second area at 3:00 that was ER/PR positive but HER-2/neu negative. The patient elected to proceed with neoadjuvant treatment followed by left mastectomy, reconstruction, adjuvant radiation, adjuvant Herceptin, and Arimidex daily.  Her detailed treatment is below.    CURRENT THERAPY: Arimidex 59m daily  INTERVAL HISTORY:  [name] here today for follow up of her left breast cancer. She has gone to physical therapy and does report increased range of motion of her left arm.  She is taking Arimidex daily and continues to tolerate it well.  She denies joint aches, increased hot flashes, vaginal dryness or dry eyes.  She does have some frustration about her left breast and the reconstruction.  She was informed by Dr. SMigdalia Dkthat she has "dead fat" on her left upper chest wall near her breast.  It is not red and inflamed like it was previously, now it is hard and the only relief she does get, is after getting it massaged at [facility].  Otherwise, she is doing well and denies fevers, chills, Marie pain, nausea, vomiting, bowel/bladder changes, headaches, weakness or any further concerns.     REVIEW OF SYSTEMS: A  10 point review of systems was conducted and is otherwise negative except for what is noted above.     PAST MEDICAL HISTORY: Past Medical History  Diagnosis Date  . Anemia   . SVT (supraventricular tachycardia)   . Breast cancer 12/04/11    left- inv ductal ca, DCIS, ER/PR +, HER 2 +  . History of chemotherapy 12/31/11 -03/11/12    s/p 4 cycles  . History of radiation therapy 05/04/12-06/20/12    left breast/    PAST SURGICAL HISTORY: Past Surgical History  Procedure Laterality Date  . Dilation and curettage of uterus    . Eye surgery      age 49-lt eye growth  . Colonoscopy    . Portacath placement  12/23/2011    Procedure: INSERTION PORT-A-CATH;  Surgeon: Marie Bookbinder MD;  [facility]  Service: General;  Laterality: Right;  . Mastectomy w/ sentinel node biopsy  04/06/2012    Procedure: MASTECTOMY WITH SENTINEL LYMPH NODE BIOPSY;  Surgeon: Marie Bookbinder MD;  [facility]  Service: General;  Laterality: Left;  left mastectomy, left axillary sentinel node biopsy  . Breast reconstruction with placement of tissue expander and flex hd (acellular hydrated dermis)  04/06/2012    Procedure: BREAST RECONSTRUCTION WITH PLACEMENT OF TISSUE EXPANDER AND FLEX HD (ACELLULAR HYDRATED DERMIS);  Surgeon: Marie Kos DO;  Location[facility]  Service: Plastics;  Laterality: Left;  IMMEDIATE LEFT BREAST RECONSTRUCTION WITH PLACEMENT OF TISSUE EXPANDER AND ALLODERM  . Removal of tissue expander and placement of implant Left 01/19/2013  Procedure: REMOVAL OF LEFT TISSUE EXPANDERS WITH PLACEMENT OF LEFT BREAST IMPLANT;  Surgeon: Marie Kos, DO;  Location: [facility];  Service: Plastics;  Laterality: Left;  Marland Kitchen Mastopexy Right 01/19/2013    Procedure: RIGHT BREAST PLACEMENT OF IMPLANT WITH MASTOPEXY;  Surgeon: Marie Kos, DO;  Location: [facility];  Service: Plastics;  Laterality: Right;  .  Liposuction with lipofilling N/A 01/19/2013    Procedure: LIPOSUCTION WITH LIPOFILLING;  Surgeon: Marie Kos, DO;  Location: [facility];  Service: Plastics;  Laterality: N/A;  . Port-a-cath removal Right 01/19/2013    Procedure: REMOVAL PORT-A-CATH;  Surgeon: Marie Bookbinder, MD;  Location: [facility];  Service: General;  Laterality: Right;    FAMILY HISTORY Family History  Problem Relation Age of Onset  . Stroke Mother   . Cancer Father     prostate    GYNECOLOGIC HISTORY: menarche at age 61, G12 P2, No history of HRT therapy  SOCIAL HISTORY:  Lives with fiance Marie Anderson in a house.  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     ADVANCED DIRECTIVES:  Not in place.  HEALTH MAINTENANCE: History  Substance Use Topics  . Smoking status: Never Smoker   . Smokeless tobacco: Never Used  . Alcohol Use: Yes   Mammogram: 11/2012 Colonoscopy: 2007, 10 year f/u recommended Bone Density Scan: 11/2012, normal Pap Smear: 11/2012 Eye Exam: due Vitamin D Level: by PCP Lipid Panel: by PCP    Allergies  Allergen Reactions  . Cephalosporins     headache  . Codeine Nausea Only    Current Outpatient Prescriptions  Medication Sig Dispense Refill  . anastrozole (ARIMIDEX) 1 MG tablet Take 1 tablet (1 mg total) by mouth daily.  90 tablet  6  . calcium carbonate (OS-CAL) 600 MG TABS tablet Take 1,200 mg by mouth daily with breakfast.      . cholecalciferol (VITAMIN D) 1000 UNITS tablet Take by mouth daily.       Marland Kitchen doxycycline (VIBRA-TABS) 100 MG tablet Take 1 tablet (100 mg total) by mouth 2 (two) times daily.  28 tablet  0  . fluocinonide-emollient (LIDEX-E) 0.05 % cream Apply 1 application topically as needed.       Marland Kitchen HYDROcodone-acetaminophen (NORCO/VICODIN) 5-325 MG per tablet Take 1 tablet by mouth as needed.       . metoprolol succinate (TOPROL-XL) 100 MG 24 hr tablet TAKE 1 TABLET BY MOUTH EVERY DAY  90 tablet  3  . non-metallic  deodorant (ALRA) MISC Apply 1 application topically daily as needed.       No current facility-administered medications for this visit.    OBJECTIVE: Filed Vitals:   10/11/13 1141  BP: 121/65  Pulse: 74  Temp: 97.7 F (36.5 C)  Resp: 18     Body mass index is 34.57 kg/(m^2).     GENERAL: Patient is a well appearing female in no acute distress HEENT:  Sclerae anicteric.  Oropharynx clear and moist. No ulcerations or evidence of oropharyngeal candidiasis. Neck is supple.  NODES:  No cervical, supraclavicular, or axillary lymphadenopathy palpated.  BREAST EXAM:  Left breast s/p reconstruction, area of erythema above the implant.  Swelling is present, area is tender to palpation. Right breast no masses or lesions, benign bilateral breast exam.   LUNGS:  Clear to auscultation bilaterally.  No wheezes or rhonchi. HEART:  Regular rate and rhythm. No murmur appreciated. ABDOMEN:  Soft, nontender.  Positive, normoactive bowel sounds. No  organomegaly palpated. MSK:  No focal spinal tenderness to palpation. Full range of motion bilaterally in the upper extremities. EXTREMITIES:  No peripheral edema.   SKIN:  Clear with no obvious rashes or skin changes. No nail dyscrasia. NEURO:  Nonfocal. Well oriented.  Appropriate affect. ECOG FS:1 - Symptomatic but completely ambulatory     LAB RESULTS:  CMP     Component Value Date/Time   NA 147* 07/06/2013 1027   NA 143 03/31/2012 1200   K 3.8 07/06/2013 1027   K 3.7 03/31/2012 1200   CL 109* 08/19/2012 1021   CL 107 03/31/2012 1200   CO2 27 07/06/2013 1027   CO2 29 03/31/2012 1200   GLUCOSE 96 07/06/2013 1027   GLUCOSE 91 08/19/2012 1021   GLUCOSE 92 03/31/2012 1200   BUN 9.4 07/06/2013 1027   BUN 7 03/31/2012 1200   CREATININE 0.8 07/06/2013 1027   CREATININE 0.67 03/31/2012 1200   CALCIUM 9.3 07/06/2013 1027   CALCIUM 8.6 03/31/2012 1200   PROT 7.1 07/06/2013 1027   ALBUMIN 3.9 07/06/2013 1027   AST 17 07/06/2013 1027   ALT 12 07/06/2013 1027   ALKPHOS 82  07/06/2013 1027   BILITOT 0.60 07/06/2013 1027   GFRNONAA >90 03/31/2012 1200   GFRAA >90 03/31/2012 1200    I No results found for this basename: SPEP,  UPEP,   kappa and lambda light chains    Lab Results  Component Value Date   WBC 2.8* 10/11/2013   NEUTROABS 1.7 10/11/2013   HGB 13.6 10/11/2013   HCT 41.9 10/11/2013   MCV 99.7 10/11/2013   PLT 78* 10/11/2013      Chemistry      Component Value Date/Time   NA 147* 07/06/2013 1027   NA 143 03/31/2012 1200   K 3.8 07/06/2013 1027   K 3.7 03/31/2012 1200   CL 109* 08/19/2012 1021   CL 107 03/31/2012 1200   CO2 27 07/06/2013 1027   CO2 29 03/31/2012 1200   BUN 9.4 07/06/2013 1027   BUN 7 03/31/2012 1200   CREATININE 0.8 07/06/2013 1027   CREATININE 0.67 03/31/2012 1200      Component Value Date/Time   CALCIUM 9.3 07/06/2013 1027   CALCIUM 8.6 03/31/2012 1200   ALKPHOS 82 07/06/2013 1027   AST 17 07/06/2013 1027   ALT 12 07/06/2013 1027   BILITOT 0.60 07/06/2013 1027       Lab Results  Component Value Date   LABCA2 70* 12/16/2011    No components found with this basename: [id number]    No results found for this basename: INR,  in the last 168 hours  Urinalysis No results found for this basename: colorurine,  appearanceur,  labspec,  phurine,  glucoseu,  hgbur,  bilirubinur,  ketonesur,  proteinur,  urobilinogen,  nitrite,  leukocytesur    STUDIES: No results found.  ASSESSMENT: 68 y.o. [place], Marie Anderson with clinical at 12 o'clock T2 Nx, stage IIA/IIB invasive ductal carcinoma of the left breast ER positive PR positive HER-2/neu positive with Ki-67 94% diagnosed in 12/2011.  She also had at 3 oclock clinical T1Nx, invasive ductal carcinoma, ER positive, PR positive, Ki-67 of 24%, HER-2/neu negative.    1. The patient underwent 4 cycles of neoadjuvant Docetaxel, Trastuzumab, and Pertuzumab given every 21 days administered from 12/31/2011 to 03/11/2012.   2. The patient had MRI of the breasts performed on 03/07/2012 that  revealed good response to neoadjuvant treatment. She was referred to Marie Anderson for  definitive surgery.   3. The patient underwent a left mastectomy and sentinel lymph node biopsy on 04/06/2012. This showed a 0.8 cm grade 1 invasive ductal carcinoma with associated low-grade ductal carcinoma in situ. One sentinel lymph node had a micrometastatic focus of ductal carcinoma. Treatment effect was noted within the primary lesion however nests of ductal carcinoma involving the lymphatics were still present. The lymph node shows no evidence of tumor effect. The margins were widely negative. Her tumor was estrogen and progesterone receptor 100% positive. She did have immediate reconstruction with a expander placed at the time of her mastectomy.   4.  Patient completed adjuvant radiation therapy from 05/04/12 to 06/20/12.   5. Patient did complete adjuvant every 3 week Trastuzumab on 01/12/13.  She finished one calendar year of therapy.   6. Patient was started on adjuvant Arimidex anti-estrogen therapy starting 07/29/12. She is tolerating this well.   PLAN:  Marie Anderson is doing well today.  Her labs remain at her baseline and I reviewed them with her in detail.  She continues to tolerate Arimidex well and will continue this.  She has no sign of recurrence.    Regarding her left chest wall area and difficulty following plastic surgery, I recommended she continue to f/u with Dr. Migdalia Anderson as she recommended for the "dead fat".  Marie Anderson has not had f/u with Dr. Donne Hazel recently and I recommended that she do so in the next month.    I recommended healthy diet, exercise, and monthly breast exams.   She knows to call us in the interim for any questions or concerns.  We can certainly see her sooner if needed.   I spent 25 minutes counseling the patient face to face.  The total time spent in the appointment was 30 minutes.  Minette Headland, [place] [phone] 10/11/2013 12:14 PM

## 2013-10-11 NOTE — Patient Instructions (Signed)
You are doing well.  Follow up with Dr. Donne Hazel this month.  Continue taking Arimidex daily.  I recommend healthy diet, exercise and monthly breast exams.    Please call us if you have any questions or concerns.    Breast Self-Awareness Practicing breast self-awareness may pick up problems early, prevent significant medical complications, and possibly save your life. By practicing breast self-awareness, you can become familiar with how your breasts look and feel and if your breasts are changing. This allows you to notice changes early. It can also offer you some reassurance that your breast health is good. One way to learn what is normal for your breasts and whether your breasts are changing is to do a breast self-exam. If you find a lump or something that was not present in the past, it is best to contact your caregiver right away. Other findings that should be evaluated by your caregiver include nipple discharge, especially if it is bloody; skin changes or reddening; areas where the skin seems to be pulled in (retracted); or new lumps and bumps. Breast pain is seldom associated with cancer (malignancy), but should also be evaluated by a caregiver. HOW TO PERFORM A BREAST SELF-EXAM The best time to examine your breasts is 5-7 days after your menstrual period is over. During menstruation, the breasts are lumpier, and it may be more difficult to pick up changes. If you do not menstruate, have reached menopause, or had your uterus removed (hysterectomy), you should examine your breasts at regular intervals, such as monthly. If you are breastfeeding, examine your breasts after a feeding or after using a breast pump. Breast implants do not decrease the risk for lumps or tumors, so continue to perform breast self-exams as recommended. Talk to your caregiver about how to determine the difference between the implant and breast tissue. Also, talk about the amount of pressure you should use during the exam. Over  time, you will become more familiar with the variations of your breasts and more comfortable with the exam. A breast self-exam requires you to remove all your clothes above the waist. 1. Look at your breasts and nipples. Stand in front of a mirror in a room with good lighting. With your hands on your hips, push your hands firmly downward. Look for a difference in shape, contour, and size from one breast to the other (asymmetry). Asymmetry includes puckers, dips, or bumps. Also, look for skin changes, such as reddened or scaly areas on the breasts. Look for nipple changes, such as discharge, dimpling, repositioning, or redness. 2. Carefully feel your breasts. This is best done either in the shower or tub while using soapy water or when flat on your back. Place the arm (on the side of the breast you are examining) above your head. Use the pads (not the fingertips) of your three middle fingers on your opposite hand to feel your breasts. Start in the underarm area and use  inch (2 cm) overlapping circles to feel your breast. Use 3 different levels of pressure (light, medium, and firm pressure) at each circle before moving to the next circle. The light pressure is needed to feel the tissue closest to the skin. The medium pressure will help to feel breast tissue a little deeper, while the firm pressure is needed to feel the tissue close to the ribs. Continue the overlapping circles, moving downward over the breast until you feel your ribs below your breast. Then, move one finger-width towards the center of the body. Continue  to use the  inch (2 cm) overlapping circles to feel your breast as you move slowly up toward the collar bone (clavicle) near the base of the neck. Continue the up and down exam using all 3 pressures until you reach the middle of the chest. Do this with each breast, carefully feeling for lumps or changes. 3.  Keep a written record with breast changes or normal findings for each breast. By writing  this information down, you do not need to depend only on memory for size, tenderness, or location. Write down where you are in your menstrual cycle, if you are still menstruating. Breast tissue can have some lumps or thick tissue. However, see your caregiver if you find anything that concerns you.  SEEK MEDICAL CARE IF:  You see a change in shape, contour, or size of your breasts or nipples.   You see skin changes, such as reddened or scaly areas on the breasts or nipples.   You have an unusual discharge from your nipples.   You feel a new lump or unusually thick areas.  Document Released: 02/16/2005 Document Revised: 02/03/2012 Document Reviewed: 06/03/2011 [facility] Patient Information 2015 [place], [place]. This information is not intended to replace advice given to you by your health care provider. Make sure you discuss any questions you have with your health care provider.

## 2013-11-20 ENCOUNTER — Encounter: Payer: Self-pay | Admitting: Adult Health

## 2014-01-01 ENCOUNTER — Encounter: Payer: Self-pay | Admitting: Adult Health

## 2014-01-17 ENCOUNTER — Other Ambulatory Visit: Payer: Self-pay | Admitting: Obstetrics and Gynecology

## 2014-01-19 LAB — CYTOLOGY - PAP

## 2014-02-03 ENCOUNTER — Telehealth: Payer: Self-pay | Admitting: Hematology and Oncology

## 2014-02-03 NOTE — Telephone Encounter (Signed)
lvm for pt regarding to 2.10 appt time chang...mailed pt appt sched and letter

## 2014-03-07 ENCOUNTER — Other Ambulatory Visit: Payer: Self-pay | Admitting: Dermatology

## 2014-03-07 DIAGNOSIS — D229 Melanocytic nevi, unspecified: Secondary | ICD-10-CM | POA: Diagnosis not present

## 2014-03-07 DIAGNOSIS — Z85828 Personal history of other malignant neoplasm of skin: Secondary | ICD-10-CM | POA: Diagnosis not present

## 2014-03-07 DIAGNOSIS — D2272 Melanocytic nevi of left lower limb, including hip: Secondary | ICD-10-CM | POA: Diagnosis not present

## 2014-03-07 DIAGNOSIS — L821 Other seborrheic keratosis: Secondary | ICD-10-CM | POA: Diagnosis not present

## 2014-03-07 DIAGNOSIS — D485 Neoplasm of uncertain behavior of skin: Secondary | ICD-10-CM | POA: Diagnosis not present

## 2014-03-07 DIAGNOSIS — Z86018 Personal history of other benign neoplasm: Secondary | ICD-10-CM | POA: Diagnosis not present

## 2014-03-07 DIAGNOSIS — L723 Sebaceous cyst: Secondary | ICD-10-CM | POA: Diagnosis not present

## 2014-04-10 ENCOUNTER — Other Ambulatory Visit: Payer: Self-pay

## 2014-04-10 DIAGNOSIS — C50212 Malignant neoplasm of upper-inner quadrant of left female breast: Secondary | ICD-10-CM

## 2014-04-11 ENCOUNTER — Other Ambulatory Visit: Payer: Medicare Other

## 2014-04-11 ENCOUNTER — Ambulatory Visit (HOSPITAL_BASED_OUTPATIENT_CLINIC_OR_DEPARTMENT_OTHER): Payer: Medicare Other | Admitting: Hematology and Oncology

## 2014-04-11 ENCOUNTER — Other Ambulatory Visit (HOSPITAL_BASED_OUTPATIENT_CLINIC_OR_DEPARTMENT_OTHER): Payer: Medicare Other

## 2014-04-11 ENCOUNTER — Ambulatory Visit: Payer: Medicare Other | Admitting: Hematology and Oncology

## 2014-04-11 ENCOUNTER — Telehealth: Payer: Self-pay | Admitting: Hematology and Oncology

## 2014-04-11 DIAGNOSIS — C50812 Malignant neoplasm of overlapping sites of left female breast: Secondary | ICD-10-CM | POA: Diagnosis not present

## 2014-04-11 DIAGNOSIS — C50212 Malignant neoplasm of upper-inner quadrant of left female breast: Secondary | ICD-10-CM

## 2014-04-11 DIAGNOSIS — Z170 Estrogen receptor positive status [ER+]: Secondary | ICD-10-CM

## 2014-04-11 DIAGNOSIS — D72819 Decreased white blood cell count, unspecified: Secondary | ICD-10-CM | POA: Diagnosis not present

## 2014-04-11 DIAGNOSIS — D6959 Other secondary thrombocytopenia: Secondary | ICD-10-CM

## 2014-04-11 LAB — CBC WITH DIFFERENTIAL/PLATELET
BASO%: 0.5 % (ref 0.0–2.0)
Basophils Absolute: 0 10*3/uL (ref 0.0–0.1)
EOS ABS: 0 10*3/uL (ref 0.0–0.5)
EOS%: 0.9 % (ref 0.0–7.0)
HCT: 42.8 % (ref 34.8–46.6)
HGB: 13.8 g/dL (ref 11.6–15.9)
LYMPH#: 0.9 10*3/uL (ref 0.9–3.3)
LYMPH%: 25.6 % (ref 14.0–49.7)
MCH: 32.1 pg (ref 25.1–34.0)
MCHC: 32.3 g/dL (ref 31.5–36.0)
MCV: 99.5 fL (ref 79.5–101.0)
MONO#: 0.3 10*3/uL (ref 0.1–0.9)
MONO%: 9.4 % (ref 0.0–14.0)
NEUT%: 63.6 % (ref 38.4–76.8)
NEUTROS ABS: 2.2 10*3/uL (ref 1.5–6.5)
PLATELETS: 83 10*3/uL — AB (ref 145–400)
RBC: 4.3 10*6/uL (ref 3.70–5.45)
RDW: 13.9 % (ref 11.2–14.5)
WBC: 3.5 10*3/uL — AB (ref 3.9–10.3)

## 2014-04-11 LAB — COMPREHENSIVE METABOLIC PANEL (CC13)
ALBUMIN: 4.1 g/dL (ref 3.5–5.0)
ALT: 12 U/L (ref 0–55)
ANION GAP: 10 meq/L (ref 3–11)
AST: 18 U/L (ref 5–34)
Alkaline Phosphatase: 79 U/L (ref 40–150)
BILIRUBIN TOTAL: 0.68 mg/dL (ref 0.20–1.20)
BUN: 9.6 mg/dL (ref 7.0–26.0)
CO2: 27 meq/L (ref 22–29)
Calcium: 9 mg/dL (ref 8.4–10.4)
Chloride: 107 mEq/L (ref 98–109)
Creatinine: 0.8 mg/dL (ref 0.6–1.1)
EGFR: 83 mL/min/{1.73_m2} — AB (ref >90)
GLUCOSE: 91 mg/dL (ref 70–140)
Potassium: 3.8 mEq/L (ref 3.5–5.1)
Sodium: 144 mEq/L (ref 136–145)
Total Protein: 7.2 g/dL (ref 6.4–8.3)

## 2014-04-11 NOTE — Assessment & Plan Note (Signed)
Left breast invasive ductal carcinoma ER/PR positive HER-2 negative Ki-67 94% diagnosed October 2013 status post 4 cycles of [place] followed by left mastectomy which revealed a 0.8 cm grade 1 IDC, 1 sentinel node micrometastatic disease ER/PR 100% positive status post radiation therapy and completed adjuvant trastuzumab 01/12/2013 currently on Arimidex 1 mg daily from 07/29/2012  Arimidex toxicities: 1. Occasional hot flashes 2. Muscle stiffness in the morning 3. Patient gets bone densities every other year and they have been normal according to her  Breast cancer surveillance: 1. Mammograms in the right breast done at [facility] were apparently normal I do not have a copy of this result 2. Breast examination and chest wall examination were without any abnormalities 04/11/2014  Thrombocytopenia: Related to prior chemotherapy. This can be observed and monitored. Leukopenia: White count 3.5. Also probably related to prior chemotherapy. Return to clinic in 6 months for follow-up

## 2014-04-11 NOTE — Progress Notes (Signed)
Patient Care Team: Darlyn Chamber, MD as PCP - General (Obstetrics and Gynecology)  DIAGNOSIS: Breast cancer of upper-inner quadrant of left female breast   Staging form: Breast, AJCC 7th Edition     Clinical stage from 12/16/2011: Stage IIA (T2, N0, cM0) - Unsigned     Pathologic: No stage assigned - Unsigned   SUMMARY OF ONCOLOGIC HISTORY:   Breast cancer of upper-inner quadrant of left female breast   12/31/2011 - 03/11/2012 Neo-Adjuvant Chemotherapy [place] 4 followed by Herceptin maintenance completed 01/29/2013   04/06/2012 Surgery Left breast mastectomy: IDC grade 1; 0.8 cm, low-grade DCIS, LV I identified, 1/2 lymph node Micro met, ER/PR positive HER-2 positive Ki-67 94%   05/04/2012 - 06/20/2012 Radiation Therapy Adjuvant radiation therapy   07/29/2012 -  Anti-estrogen oral therapy Arimidex 1 mg daily    CHIEF COMPLIANT: Follow-up of breast cancer on Arimidex  INTERVAL HISTORY: Marie Anderson is a 69 year old lady with above-mentioned history of left breast cancer treated with neoadjuvant chemotherapy followed by mastectomy and radiation and is currently on Arimidex 1 mg daily from May 2014. She appears to be doing very well without any major problems or concerns other than occasional hot flashes and myalgias. She does not have any problem with osteoporosis. She is otherwise feeling well. She is not entirely happy with her left breast reconstruction. She believes that the radiation given to the left chest wall made it very tense and hence it feels very tight in the left side of the chest.  REVIEW OF SYSTEMS:   Constitutional: Denies fevers, chills or abnormal weight loss Eyes: Denies blurriness of vision Ears, nose, mouth, throat, and face: Denies mucositis or sore throat Respiratory: Denies cough, dyspnea or wheezes Cardiovascular: Denies palpitation, chest discomfort or lower extremity swelling Gastrointestinal:  Denies nausea, heartburn or change in bowel habits Skin: Denies  abnormal skin rashes Lymphatics: Denies new lymphadenopathy or easy bruising Neurological:Denies numbness, tingling or new weaknesses Behavioral/Psych: Mood is stable, no new changes  Breast:  denies any pain or lumps or nodules in either breasts All other systems were reviewed with the patient and are negative.  I have reviewed the past medical history, past surgical history, social history and family history with the patient and they are unchanged from previous note.  ALLERGIES:  is allergic to cephalosporins and codeine.  MEDICATIONS:  Current Outpatient Prescriptions  Medication Sig Dispense Refill  . anastrozole (ARIMIDEX) 1 MG tablet Take 1 tablet (1 mg total) by mouth daily. 90 tablet 6  . calcium carbonate (OS-CAL) 600 MG TABS tablet Take 1,200 mg by mouth daily with breakfast.    . cholecalciferol (VITAMIN D) 1000 UNITS tablet Take by mouth daily.     . cyanocobalamin 100 MCG tablet Take 100 mcg by mouth daily.    . fluocinonide-emollient (LIDEX-E) 0.05 % cream Apply 1 application topically as needed.     Marland Kitchen HYDROcodone-acetaminophen (NORCO/VICODIN) 5-325 MG per tablet Take 1 tablet by mouth as needed.     . metoprolol succinate (TOPROL-XL) 100 MG 24 hr tablet TAKE 1 TABLET BY MOUTH EVERY DAY 90 tablet 3  . non-metallic deodorant (ALRA) MISC Apply 1 application topically daily as needed.    . vitamin C (ASCORBIC ACID) 500 MG tablet Take 500 mg by mouth daily.    . vitamin E 200 UNIT capsule Take 200 Units by mouth daily.     No current facility-administered medications for this visit.    PHYSICAL EXAMINATION: ECOG PERFORMANCE STATUS: 1 - Symptomatic but  completely ambulatory  Filed Vitals:   04/11/14 1402  BP: 166/84  Pulse: 77  Temp: 98.4 F (36.9 C)  Resp: 18   Filed Weights   04/11/14 1402  Weight: 206 lb 6.4 oz (93.622 kg)    GENERAL:alert, no distress and comfortable SKIN: skin color, texture, turgor are normal, no rashes or significant lesions EYES: normal,  Conjunctiva are pink and non-injected, sclera clear OROPHARYNX:no exudate, no erythema and lips, buccal mucosa, and tongue normal  NECK: supple, thyroid normal size, non-tender, without nodularity LYMPH:  no palpable lymphadenopathy in the cervical, axillary or inguinal LUNGS: clear to auscultation and percussion with normal breathing effort HEART: regular rate & rhythm and no murmurs and no lower extremity edema ABDOMEN:abdomen soft, non-tender and normal bowel sounds Musculoskeletal:no cyanosis of digits and no clubbing  NEURO: alert & oriented x 3 with fluent speech, no focal motor/sensory deficits BREAST: No palpable masses or nodules in either right or left breasts. No palpable axillary supraclavicular or infraclavicular adenopathy no breast tenderness or nipple discharge. (exam performed in the presence of a chaperone)  LABORATORY DATA:  I have reviewed the data as listed   Chemistry      Component Value Date/Time   NA 144 04/11/2014 1346   NA 143 03/31/2012 1200   K 3.8 04/11/2014 1346   K 3.7 03/31/2012 1200   CL 109* 08/19/2012 1021   CL 107 03/31/2012 1200   CO2 27 04/11/2014 1346   CO2 29 03/31/2012 1200   BUN 9.6 04/11/2014 1346   BUN 7 03/31/2012 1200   CREATININE 0.8 04/11/2014 1346   CREATININE 0.67 03/31/2012 1200      Component Value Date/Time   CALCIUM 9.0 04/11/2014 1346   CALCIUM 8.6 03/31/2012 1200   ALKPHOS 79 04/11/2014 1346   AST 18 04/11/2014 1346   ALT 12 04/11/2014 1346   BILITOT 0.68 04/11/2014 1346       Lab Results  Component Value Date   WBC 3.5* 04/11/2014   HGB 13.8 04/11/2014   HCT 42.8 04/11/2014   MCV 99.5 04/11/2014   PLT 83* 04/11/2014   NEUTROABS 2.2 04/11/2014   ASSESSMENT & PLAN:  Breast cancer of upper-inner quadrant of left female breast Left breast invasive ductal carcinoma ER/PR positive HER-2 negative Ki-67 94% diagnosed October 2013 status post 4 cycles of [place] followed by left mastectomy which revealed a 0.8 cm  grade 1 IDC, 1 sentinel node micrometastatic disease ER/PR 100% positive status post radiation therapy and completed adjuvant trastuzumab 01/12/2013 currently on Arimidex 1 mg daily from 07/29/2012  Arimidex toxicities: 1. Occasional hot flashes 2. Muscle stiffness in the morning 3. Patient gets bone densities every other year and they have been normal according to her  Breast cancer surveillance: 1. Mammograms in the right breast done at [facility] were apparently normal I do not have a copy of this result 2. Breast examination and chest wall examination were without any abnormalities 04/11/2014  Thrombocytopenia: Related to prior chemotherapy. This can be observed and monitored. Leukopenia: White count 3.5. Also probably related to prior chemotherapy. Return to clinic in 6 months for follow-up    Orders Placed This Encounter  Procedures  . CBC with Differential    Standing Status: Future     Number of Occurrences:      Standing Expiration Date: 04/11/2015  . Comprehensive metabolic panel (Cmet) - [facility]    Standing Status: Future     Number of Occurrences:      Standing Expiration Date:  04/11/2015   The patient has a good understanding of the overall plan. she agrees with it. She will call with any problems that may develop before her next visit here.   Rulon Eisenmenger, MD

## 2014-04-11 NOTE — Telephone Encounter (Signed)
, °

## 2014-05-02 ENCOUNTER — Other Ambulatory Visit: Payer: Self-pay | Admitting: Dermatology

## 2014-05-02 DIAGNOSIS — L089 Local infection of the skin and subcutaneous tissue, unspecified: Secondary | ICD-10-CM | POA: Diagnosis not present

## 2014-05-02 DIAGNOSIS — D2272 Melanocytic nevi of left lower limb, including hip: Secondary | ICD-10-CM | POA: Diagnosis not present

## 2014-05-11 ENCOUNTER — Other Ambulatory Visit: Payer: Self-pay | Admitting: *Deleted

## 2014-05-11 MED ORDER — METOPROLOL SUCCINATE ER 100 MG PO TB24: 100.0000 mg | ORAL_TABLET | Freq: Every day | ORAL | Status: DC

## 2014-06-27 ENCOUNTER — Other Ambulatory Visit: Payer: Self-pay

## 2014-06-27 ENCOUNTER — Ambulatory Visit (INDEPENDENT_AMBULATORY_CARE_PROVIDER_SITE_OTHER): Payer: Medicare Other | Admitting: Cardiology

## 2014-06-27 ENCOUNTER — Encounter: Payer: Self-pay | Admitting: Cardiology

## 2014-06-27 VITALS — BP 148/100 | HR 75 | Ht 65.0 in | Wt 208.8 lb

## 2014-06-27 DIAGNOSIS — I471 Supraventricular tachycardia: Secondary | ICD-10-CM | POA: Diagnosis not present

## 2014-06-27 MED ORDER — METOPROLOL SUCCINATE ER 100 MG PO TB24
100.0000 mg | ORAL_TABLET | Freq: Every day | ORAL | Status: DC
Start: 2014-06-27 — End: 2015-05-08

## 2014-06-27 MED ORDER — METOPROLOL SUCCINATE ER 100 MG PO TB24: 100.0000 mg | ORAL_TABLET | Freq: Every day | ORAL | Status: DC

## 2014-06-27 NOTE — Progress Notes (Signed)
[name] Date of Birth: May 29, 1945 Medical Record #[id number]  History of Present Illness: Marie Anderson is seen for  followup SVT. She has a history of supraventricular tachycardia that has been well controlled with metoprolol. She also has a history of breast cancer. She had a left mastectomy with reconstruction. She was treated with Herceptin. Serial Echos have been normal. Her last episode of SVT requiring Adenosine was in November 2013. In July 2015 she had an episode that responded to Valsalva maneuver. She has had no recurrent palpitations since that time. Now just on Arimidex.   Current Outpatient Prescriptions on File Prior to Visit  Medication Sig Dispense Refill  . anastrozole (ARIMIDEX) 1 MG tablet Take 1 tablet (1 mg total) by mouth daily. 90 tablet 6  . calcium carbonate (OS-CAL) 600 MG TABS tablet Take 1,200 mg by mouth daily with breakfast.    . cholecalciferol (VITAMIN D) 1000 UNITS tablet Take by mouth daily.     . cyanocobalamin 100 MCG tablet Take 100 mcg by mouth daily.    . fluocinonide-emollient (LIDEX-E) 0.05 % cream Apply 1 application topically as needed.     Marland Kitchen HYDROcodone-acetaminophen (NORCO/VICODIN) 5-325 MG per tablet Take 1 tablet by mouth as needed.     . non-metallic deodorant Jethro Poling) MISC Apply 1 application topically daily as needed.    . vitamin C (ASCORBIC ACID) 500 MG tablet Take 500 mg by mouth daily.    . vitamin E 200 UNIT capsule Take 200 Units by mouth daily.     No current facility-administered medications on file prior to visit.    Allergies  Allergen Reactions  . Cephalosporins     headache  . Codeine Nausea Only    Past Medical History  Diagnosis Date  . Anemia   . SVT (supraventricular tachycardia)   . Breast cancer 12/04/11    left- inv ductal ca, DCIS, ER/PR +, HER 2 +  . History of chemotherapy 12/31/11 -03/11/12    s/p 4 cycles  . History of radiation therapy 05/04/12-06/20/12    left breast/    Past Surgical History   Procedure Laterality Date  . Dilation and curettage of uterus    . Eye surgery      age 34-lt eye growth  . Colonoscopy    . Portacath placement  12/23/2011    Procedure: INSERTION PORT-A-CATH;  Surgeon: Rolm Bookbinder, MD;  Location: [place];  Service: General;  Laterality: Right;  . Mastectomy w/ sentinel node biopsy  04/06/2012    Procedure: MASTECTOMY WITH SENTINEL LYMPH NODE BIOPSY;  Surgeon: Rolm Bookbinder, MD;  Location: [place];  Service: General;  Laterality: Left;  left mastectomy, left axillary sentinel node biopsy  . Breast reconstruction with placement of tissue expander and flex hd (acellular hydrated dermis)  04/06/2012    Procedure: BREAST RECONSTRUCTION WITH PLACEMENT OF TISSUE EXPANDER AND FLEX HD (ACELLULAR HYDRATED DERMIS);  Surgeon: Theodoro Kos, DO;  Location: [place];  Service: Plastics;  Laterality: Left;  IMMEDIATE LEFT BREAST RECONSTRUCTION WITH PLACEMENT OF TISSUE EXPANDER AND ALLODERM  . Removal of tissue expander and placement of implant Left 01/19/2013    Procedure: REMOVAL OF LEFT TISSUE EXPANDERS WITH PLACEMENT OF LEFT BREAST IMPLANT;  Surgeon: Theodoro Kos, DO;  Location: [place];  Service: Plastics;  Laterality: Left;  Marland Kitchen Mastopexy Right 01/19/2013    Procedure: RIGHT BREAST PLACEMENT OF IMPLANT WITH MASTOPEXY;  Surgeon: Theodoro Kos, DO;  Location: [place];  Service: Clinical cytogeneticist;  Laterality: Right;  . Liposuction with lipofilling N/A 01/19/2013    Procedure: LIPOSUCTION WITH LIPOFILLING;  Surgeon: Theodoro Kos, DO;  Location: [place];  Service: Plastics;  Laterality: N/A;  . Port-a-cath removal Right 01/19/2013    Procedure: REMOVAL PORT-A-CATH;  Surgeon: Rolm Bookbinder, MD;  Location: [place];  Service: General;  Laterality: Right;    History  Smoking status  . Never Smoker   Smokeless tobacco  . Never Used    History   Alcohol Use  . Yes    Family History  Problem Relation Age of Onset  . Stroke Mother   . Cancer Father     prostate    Review of Systems: As noted in history of present illness.  All other systems were reviewed and are negative.  Physical Exam: BP 148/100 mmHg  Pulse 75  Ht 5\' 5"  (1.651 m)  Wt 208 lb 12.8 oz (94.711 kg)  BMI 34.75 kg/m2 WDWF in NAD. The HEENT exam is normal.  The carotids are 2+ without bruits.  There is no thyromegaly.  There is no JVD.  The lungs are clear.  She has had left breast reconstruction. The heart exam reveals a regular rate with a normal S1 and S2.  There are no murmurs, gallops, or rubs.  The PMI is not displaced.   Abdominal exam reveals good bowel sounds.  There is no hepatosplenomegaly or tenderness.  There are no masses.  Exam of the legs reveal no clubbing, cyanosis, or edema.  The legs are without rashes.  The distal pulses are intact.  Cranial nerves II - XII are intact.  Motor and sensory functions are intact.  The gait is normal.  LABORATORY DATA: Ecg today: NSR, nonspecific TWA. I have personally reviewed and interpreted this study.   Assessment / Plan: 1. Supraventricular tachycardia. Well controlled on metoprolol. We will continue on her current medication for now. Follow up in one year.  2. Breast cancer status post mastectomy. s/p chemotherapy with Herceptin. Echocardiogram showed normal LV function.

## 2014-06-27 NOTE — Patient Instructions (Signed)
Continue your current therapy  I will see you in one year   

## 2014-07-04 DIAGNOSIS — C50912 Malignant neoplasm of unspecified site of left female breast: Secondary | ICD-10-CM | POA: Diagnosis not present

## 2014-07-18 ENCOUNTER — Other Ambulatory Visit: Payer: Self-pay | Admitting: Medical Oncology

## 2014-07-18 DIAGNOSIS — C50219 Malignant neoplasm of upper-inner quadrant of unspecified female breast: Secondary | ICD-10-CM

## 2014-07-18 MED ORDER — ANASTROZOLE 1 MG PO TABS: 1.0000 mg | ORAL_TABLET | Freq: Every day | ORAL | Status: DC

## 2014-07-18 NOTE — Progress Notes (Signed)
Refill done for anastrazole.

## 2014-07-19 ENCOUNTER — Telehealth: Payer: Self-pay | Admitting: *Deleted

## 2014-07-19 NOTE — Telephone Encounter (Signed)
Received telephone advice record that patient needs refill on medication, sent to scan.

## 2014-07-19 NOTE — Telephone Encounter (Signed)
Called patient in reference to telephone advice record needing medication refill. Left VMM for patient to call and speak with any nurse as to which medication she needed refilled as she did not specify.

## 2014-08-06 ENCOUNTER — Other Ambulatory Visit: Payer: Self-pay | Admitting: *Deleted

## 2014-08-06 DIAGNOSIS — I471 Supraventricular tachycardia: Secondary | ICD-10-CM

## 2014-10-09 NOTE — Assessment & Plan Note (Signed)
Left breast invasive ductal carcinoma ER/PR positive HER-2 negative Ki-67 94% diagnosed October 2013 status post 4 cycles of [place] followed by left mastectomy which revealed a 0.8 cm grade 1 IDC, 1 sentinel node micrometastatic disease ER/PR 100% positive status post radiation therapy and completed adjuvant trastuzumab 01/12/2013 currently on Arimidex 1 mg daily from 07/29/2012  Arimidex toxicities: 1. Occasional hot flashes 2. Muscle stiffness in the morning 3. Patient gets bone densities every other year and they have been normal according to her  Breast Cancer Surveillance: 1. Breast exam 10/10/14: Normal 2. Mammogram  No abnormalities. Postsurgical changes. Breast Density Category . I recommended that she get 3-D mammograms for surveillance. Discussed the differences between different breast density categories.  Thrombocytopenia and Leukopenia  RTC in 6 months

## 2014-10-10 ENCOUNTER — Other Ambulatory Visit (HOSPITAL_BASED_OUTPATIENT_CLINIC_OR_DEPARTMENT_OTHER): Payer: Medicare Other

## 2014-10-10 ENCOUNTER — Telehealth: Payer: Self-pay | Admitting: Hematology and Oncology

## 2014-10-10 ENCOUNTER — Encounter: Payer: Self-pay | Admitting: Hematology and Oncology

## 2014-10-10 ENCOUNTER — Ambulatory Visit (HOSPITAL_BASED_OUTPATIENT_CLINIC_OR_DEPARTMENT_OTHER): Payer: Medicare Other | Admitting: Hematology and Oncology

## 2014-10-10 VITALS — BP 154/78 | HR 69 | Temp 97.7°F | Resp 18 | Ht 65.0 in | Wt 206.3 lb

## 2014-10-10 DIAGNOSIS — D696 Thrombocytopenia, unspecified: Secondary | ICD-10-CM | POA: Diagnosis not present

## 2014-10-10 DIAGNOSIS — Z170 Estrogen receptor positive status [ER+]: Secondary | ICD-10-CM

## 2014-10-10 DIAGNOSIS — C50212 Malignant neoplasm of upper-inner quadrant of left female breast: Secondary | ICD-10-CM

## 2014-10-10 DIAGNOSIS — C50812 Malignant neoplasm of overlapping sites of left female breast: Secondary | ICD-10-CM

## 2014-10-10 DIAGNOSIS — D72819 Decreased white blood cell count, unspecified: Secondary | ICD-10-CM | POA: Diagnosis not present

## 2014-10-10 LAB — COMPREHENSIVE METABOLIC PANEL (CC13)
ALBUMIN: 4.1 g/dL (ref 3.5–5.0)
ALK PHOS: 73 U/L (ref 40–150)
ALT: 17 U/L (ref 0–55)
AST: 18 U/L (ref 5–34)
Anion Gap: 5 mEq/L (ref 3–11)
BILIRUBIN TOTAL: 0.72 mg/dL (ref 0.20–1.20)
BUN: 11 mg/dL (ref 7.0–26.0)
CO2: 30 mEq/L — ABNORMAL HIGH (ref 22–29)
Calcium: 9.2 mg/dL (ref 8.4–10.4)
Chloride: 107 mEq/L (ref 98–109)
Creatinine: 0.8 mg/dL (ref 0.6–1.1)
EGFR: 73 mL/min/{1.73_m2} — ABNORMAL LOW (ref >90)
GLUCOSE: 97 mg/dL (ref 70–140)
Potassium: 3.9 mEq/L (ref 3.5–5.1)
Sodium: 143 mEq/L (ref 136–145)
Total Protein: 7.1 g/dL (ref 6.4–8.3)

## 2014-10-10 LAB — CBC WITH DIFFERENTIAL/PLATELET
BASO%: 0.7 % (ref 0.0–2.0)
Basophils Absolute: 0 10*3/uL (ref 0.0–0.1)
EOS%: 1.3 % (ref 0.0–7.0)
Eosinophils Absolute: 0 10*3/uL (ref 0.0–0.5)
HEMATOCRIT: 41.4 % (ref 34.8–46.6)
HGB: 13.8 g/dL (ref 11.6–15.9)
LYMPH#: 0.9 10*3/uL (ref 0.9–3.3)
LYMPH%: 31.3 % (ref 14.0–49.7)
MCH: 32.9 pg (ref 25.1–34.0)
MCHC: 33.3 g/dL (ref 31.5–36.0)
MCV: 98.8 fL (ref 79.5–101.0)
MONO#: 0.3 10*3/uL (ref 0.1–0.9)
MONO%: 9.1 % (ref 0.0–14.0)
NEUT#: 1.6 10*3/uL (ref 1.5–6.5)
NEUT%: 57.6 % (ref 38.4–76.8)
Platelets: 60 10*3/uL — ABNORMAL LOW (ref 145–400)
RBC: 4.19 10*6/uL (ref 3.70–5.45)
RDW: 14.8 % — ABNORMAL HIGH (ref 11.2–14.5)
WBC: 2.8 10*3/uL — ABNORMAL LOW (ref 3.9–10.3)

## 2014-10-10 MED ORDER — HYDROCODONE-ACETAMINOPHEN 5-325 MG PO TABS: 1.0000 | ORAL_TABLET | ORAL | Status: DC | PRN

## 2014-10-10 NOTE — Progress Notes (Signed)
Patient Care Team: Arvella Nigh, MD as PCP - General (Obstetrics and Gynecology)  DIAGNOSIS: Breast cancer of upper-inner quadrant of left female breast   Staging form: Breast, AJCC 7th Edition     Clinical stage from 12/16/2011: Stage IIA (T2, N0, cM0) - Unsigned     Pathologic: No stage assigned - Unsigned   SUMMARY OF ONCOLOGIC HISTORY:   Breast cancer of upper-inner quadrant of left female breast   12/31/2011 - 03/11/2012 Neo-Adjuvant Chemotherapy [place] Perjeta 4 followed by Herceptin maintenance completed 01/29/2013   04/06/2012 Surgery Left breast mastectomy: IDC grade 1; 0.8 cm, low-grade DCIS, LV I identified, 1/2 lymph node Micro met, ER/PR positive HER-2 positive Ki-67 94%   05/04/2012 - 06/20/2012 Radiation Therapy Adjuvant radiation therapy   07/29/2012 -  Anti-estrogen oral therapy Arimidex 1 mg daily    CHIEF COMPLIANT: Follow-up on Arimidex  INTERVAL HISTORY: Marie Anderson is a [age]-year-old with above-mentioned history of left breast cancer treated with you adjuvant chemotherapy followed by mastectomy radiation and is currently on Arimidex. She is tolerating the treatment fairly well. She has long-standing thrombocytopenia and leukopenia of unclear etiology. She does not have any signs or symptoms of her low blood counts.  REVIEW OF SYSTEMS:   Constitutional: Denies fevers, chills or abnormal weight loss Eyes: Denies blurriness of vision Ears, nose, mouth, throat, and face: Denies mucositis or sore throat Respiratory: Denies cough, dyspnea or wheezes Cardiovascular: Denies palpitation, chest discomfort or lower extremity swelling Gastrointestinal:  Denies nausea, heartburn or change in bowel habits Skin: Denies abnormal skin rashes Lymphatics: Denies new lymphadenopathy or easy bruising Neurological:Denies numbness, tingling or new weaknesses Behavioral/Psych: Mood is stable, no new changes  Breast:  denies any pain or lumps or nodules in either breasts All other systems  were reviewed with the patient and are negative.  I have reviewed the past medical history, past surgical history, social history and family history with the patient and they are unchanged from previous note.  ALLERGIES:  is allergic to cephalosporins and codeine.  MEDICATIONS:  Current Outpatient Prescriptions  Medication Sig Dispense Refill  . anastrozole (ARIMIDEX) 1 MG tablet Take 1 tablet (1 mg total) by mouth daily. 90 tablet 4  . calcium carbonate (OS-CAL) 600 MG TABS tablet Take 1,200 mg by mouth daily with breakfast.    . cholecalciferol (VITAMIN D) 1000 UNITS tablet Take by mouth daily.     . cyanocobalamin 100 MCG tablet Take 100 mcg by mouth daily.    . fluocinonide-emollient (LIDEX-E) 0.05 % cream Apply 1 application topically as needed.     Marland Kitchen HYDROcodone-acetaminophen (NORCO/VICODIN) 5-325 MG per tablet Take 1 tablet by mouth as needed. 30 tablet 0  . metoprolol succinate (TOPROL-XL) 100 MG 24 hr tablet Take 1 tablet (100 mg total) by mouth daily. Take with or immediately following a meal. 90 tablet 3  . non-metallic deodorant (ALRA) MISC Apply 1 application topically daily as needed.    . vitamin C (ASCORBIC ACID) 500 MG tablet Take 500 mg by mouth daily.    . vitamin E 200 UNIT capsule Take 200 Units by mouth daily.     No current facility-administered medications for this visit.    PHYSICAL EXAMINATION: ECOG PERFORMANCE STATUS: 0 - Asymptomatic  Filed Vitals:   10/10/14 1036  BP: 154/78  Pulse: 69  Temp: 97.7 F (36.5 C)  Resp: 18   Filed Weights   10/10/14 1036  Weight: 206 lb 4.8 oz (93.577 kg)    GENERAL:alert, no distress and  comfortable SKIN: skin color, texture, turgor are normal, no rashes or significant lesions EYES: normal, Conjunctiva are pink and non-injected, sclera clear OROPHARYNX:no exudate, no erythema and lips, buccal mucosa, and tongue normal  NECK: supple, thyroid normal size, non-tender, without nodularity LYMPH:  no palpable  lymphadenopathy in the cervical, axillary or inguinal LUNGS: clear to auscultation and percussion with normal breathing effort HEART: regular rate & rhythm and no murmurs and no lower extremity edema ABDOMEN:abdomen soft, non-tender and normal bowel sounds Musculoskeletal:no cyanosis of digits and no clubbing  NEURO: alert & oriented x 3 with fluent speech, no focal motor/sensory deficits  LABORATORY DATA:  I have reviewed the data as listed   Chemistry      Component Value Date/Time   NA 143 10/10/2014 1022   NA 143 03/31/2012 1200   K 3.9 10/10/2014 1022   K 3.7 03/31/2012 1200   CL 109* 08/19/2012 1021   CL 107 03/31/2012 1200   CO2 30* 10/10/2014 1022   CO2 29 03/31/2012 1200   BUN 11.0 10/10/2014 1022   BUN 7 03/31/2012 1200   CREATININE 0.8 10/10/2014 1022   CREATININE 0.67 03/31/2012 1200      Component Value Date/Time   CALCIUM 9.2 10/10/2014 1022   CALCIUM 8.6 03/31/2012 1200   ALKPHOS 73 10/10/2014 1022   AST 18 10/10/2014 1022   ALT 17 10/10/2014 1022   BILITOT 0.72 10/10/2014 1022       Lab Results  Component Value Date   WBC 2.8* 10/10/2014   HGB 13.8 10/10/2014   HCT 41.4 10/10/2014   MCV 98.8 10/10/2014   PLT 60* 10/10/2014   NEUTROABS 1.6 10/10/2014    ASSESSMENT & PLAN:  Breast cancer of upper-inner quadrant of left female breast Left breast invasive ductal carcinoma ER/PR positive HER-2 negative Ki-67 94% diagnosed October 2013 status post 4 cycles of TCH Perjeta followed by left mastectomy which revealed a 0.8 cm grade 1 IDC, 1 sentinel node micrometastatic disease ER/PR 100% positive status post radiation therapy and completed adjuvant trastuzumab 01/12/2013 currently on Arimidex 1 mg daily from 07/29/2012  Arimidex toxicities: 1. Occasional hot flashes 2. Muscle stiffness in the morning 3. Patient gets bone densities every other year and they have been normal according to her  Breast Cancer Surveillance: 1. Breast exam 10/10/14: Normal 2.  Mammogram  No abnormalities. Postsurgical changes. Breast Density Category . I recommended that she get 3-D mammograms for surveillance. Discussed the differences between different breast density categories.  Thrombocytopenia and Leukopenia: Because blood counts havenot improved, I recommended Bone marrow biopsy  RTC in 3 weeks for follow up on bone marrow results.      Orders Placed This Encounter  Procedures  . CT Biopsy    Standing Status: Future     Number of Occurrences:      Standing Expiration Date: 11/14/2015    Order Specific Question:  Reason for Exam (SYMPTOM  OR DIAGNOSIS REQUIRED)    Answer:  Bone marrow aspiration and biopsy     Comments:  Flow cytometry, cytogenetics    Order Specific Question:  Preferred imaging location?    Answer:  [facility]   The patient has a good understanding of the overall plan. she agrees with it. she will call with any problems that may develop before the next visit here.   Rulon Eisenmenger, MD

## 2014-10-10 NOTE — Telephone Encounter (Signed)
Gave avs & calendar for September °

## 2014-10-15 ENCOUNTER — Telehealth: Payer: Self-pay | Admitting: *Deleted

## 2014-10-15 ENCOUNTER — Other Ambulatory Visit: Payer: Self-pay | Admitting: *Deleted

## 2014-10-15 NOTE — Telephone Encounter (Signed)
Called and spoke with patient. Advised that this is the only procedure to evaluate low platelets. She verbalized understanding.

## 2014-10-15 NOTE — Telephone Encounter (Signed)
PT.'S PLATELETS HAVE BEEN LOW FOR THREE YEARS. IS THERE A PROCEDURE WHICH IS LESS INVASIVE? PT. IS SCHEDULED FOR A BONE MARROW BIOPSY ON 10/17/14.

## 2014-10-16 ENCOUNTER — Telehealth: Payer: Self-pay | Admitting: Hematology and Oncology

## 2014-10-16 ENCOUNTER — Other Ambulatory Visit: Payer: Self-pay | Admitting: Radiology

## 2014-10-16 NOTE — Telephone Encounter (Signed)
Called patient back as she request to reschedule her appointment

## 2014-10-17 ENCOUNTER — Ambulatory Visit (HOSPITAL_COMMUNITY)
Admission: RE | Admit: 2014-10-17 | Discharge: 2014-10-17 | Disposition: A | Discharge status: Home / Self Care | Payer: Medicare Other | Source: Ambulatory Visit | Attending: Hematology and Oncology | Admitting: Hematology and Oncology

## 2014-10-17 ENCOUNTER — Encounter (HOSPITAL_COMMUNITY): Payer: Self-pay

## 2014-10-17 DIAGNOSIS — D7581 Myelofibrosis: Secondary | ICD-10-CM | POA: Diagnosis not present

## 2014-10-17 DIAGNOSIS — D696 Thrombocytopenia, unspecified: Secondary | ICD-10-CM | POA: Diagnosis not present

## 2014-10-17 DIAGNOSIS — C50212 Malignant neoplasm of upper-inner quadrant of left female breast: Secondary | ICD-10-CM

## 2014-10-17 DIAGNOSIS — D72819 Decreased white blood cell count, unspecified: Secondary | ICD-10-CM | POA: Insufficient documentation

## 2014-10-17 DIAGNOSIS — Z79899 Other long term (current) drug therapy: Secondary | ICD-10-CM | POA: Diagnosis not present

## 2014-10-17 DIAGNOSIS — Z79811 Long term (current) use of aromatase inhibitors: Secondary | ICD-10-CM | POA: Insufficient documentation

## 2014-10-17 DIAGNOSIS — C50919 Malignant neoplasm of unspecified site of unspecified female breast: Secondary | ICD-10-CM | POA: Insufficient documentation

## 2014-10-17 DIAGNOSIS — D649 Anemia, unspecified: Secondary | ICD-10-CM | POA: Diagnosis not present

## 2014-10-17 HISTORY — DX: Nausea with vomiting, unspecified: R11.2

## 2014-10-17 HISTORY — DX: Other specified postprocedural states: Z98.890

## 2014-10-17 LAB — CBC
HEMATOCRIT: 40.4 % (ref 36.0–46.0)
HEMOGLOBIN: 13 g/dL (ref 12.0–15.0)
MCH: 31.9 pg (ref 26.0–34.0)
MCHC: 32.2 g/dL (ref 30.0–36.0)
MCV: 99.3 fL (ref 78.0–100.0)
Platelets: 58 10*3/uL — ABNORMAL LOW (ref 150–400)
RBC: 4.07 MIL/uL (ref 3.87–5.11)
RDW: 14 % (ref 11.5–15.5)
WBC: 2.8 10*3/uL — ABNORMAL LOW (ref 4.0–10.5)

## 2014-10-17 LAB — PROTIME-INR
INR: 1 (ref 0.00–1.49)
Prothrombin Time: 13.4 seconds (ref 11.6–15.2)

## 2014-10-17 LAB — APTT: aPTT: 29 seconds (ref 24–37)

## 2014-10-17 LAB — BONE MARROW EXAM

## 2014-10-17 MED ORDER — FENTANYL CITRATE (PF) 100 MCG/2ML IJ SOLN
INTRAMUSCULAR | Status: AC | PRN
  Administered 2014-10-17: 25 ug via INTRAVENOUS
  Administered 2014-10-17: 50 ug via INTRAVENOUS

## 2014-10-17 MED ORDER — MIDAZOLAM HCL 2 MG/2ML IJ SOLN
INTRAMUSCULAR | Status: AC
  Filled 2014-10-17: qty 6

## 2014-10-17 MED ORDER — HYDROCODONE-ACETAMINOPHEN 5-325 MG PO TABS: 1.0000 | ORAL_TABLET | ORAL | Status: DC | PRN

## 2014-10-17 MED ORDER — FENTANYL CITRATE (PF) 100 MCG/2ML IJ SOLN
INTRAMUSCULAR | Status: AC
  Filled 2014-10-17: qty 4

## 2014-10-17 MED ORDER — SODIUM CHLORIDE 0.9 % IV SOLN
Freq: Once | INTRAVENOUS | Status: AC
  Administered 2014-10-17: 500 mL via INTRAVENOUS

## 2014-10-17 MED ORDER — MIDAZOLAM HCL 2 MG/2ML IJ SOLN
INTRAMUSCULAR | Status: AC | PRN
  Administered 2014-10-17 (×4): 1 mg via INTRAVENOUS

## 2014-10-17 NOTE — Procedures (Signed)
Interventional Radiology Procedure Note  Procedure: CT guided aspirate and core biopsy of right iliac bone Complications: None Recommendations: - Bedrest supine x 2 hrs - Hydrocodone PRN  Pain - Follow biopsy results  Signed,  Briel Gallicchio K. Eon Zunker, MD   

## 2014-10-17 NOTE — Discharge Instructions (Signed)
Bone Marrow Aspiration, Bone Marrow Biopsy Care After Read the instructions outlined below and refer to this sheet in the next few weeks. These discharge instructions provide you with general information on caring for yourself after you leave the hospital. Your caregiver may also give you specific instructions. While your treatment has been planned according to the most current medical practices available, unavoidable complications occasionally occur. If you have any problems or questions after discharge, call your caregiver. FINDING OUT THE RESULTS OF YOUR TEST Not all test results are available during your visit. If your test results are not back during the visit, make an appointment with your caregiver to find out the results. Do not assume everything is normal if you have not heard from your caregiver or the medical facility. It is important for you to follow up on all of your test results.  HOME CARE INSTRUCTIONS  You have had sedation and may be sleepy or dizzy. Your thinking may not be as clear as usual. For the next 24 hours:  Only take over-the-counter or prescription medicines for pain, discomfort, and or fever as directed by your caregiver.  Do not drink alcohol.  Do not smoke.  Do not drive.  Do not make important legal decisions.  Do not operate heavy machinery.  Do not care for small children by yourself.  Keep your dressing clean and dry. You may replace dressing with a bandage after 24 hours.  You may take a bath or shower after 24 hours.  Use an ice pack for 20 minutes every 2 hours while awake for pain as needed. SEEK MEDICAL CARE IF:   There is redness, swelling, or increasing pain at the biopsy site.  There is pus coming from the biopsy site.  There is drainage from a biopsy site lasting longer than one day.  An unexplained oral temperature above 102 F (38.9 C) develops. SEEK IMMEDIATE MEDICAL CARE IF:   You develop a rash.  You have difficulty  breathing.  You develop any reaction or side effects to medications given. Document Released: 09/05/2004 Document Revised: 05/11/2011 Document Reviewed: 02/14/2008 [facility] Patient Information 2015 [place], [place]. This information is not intended to replace advice given to you by your health care provider. Make sure you discuss any questions you have with your health care provider.  May remove dressing and shower 24 hours post procedure. Keep dressing clean and dry.  Conscious Sedation, Adult, Care After Refer to this sheet in the next few weeks. These instructions provide you with information on caring for yourself after your procedure. Your health care provider may also give you more specific instructions. Your treatment has been planned according to current medical practices, but problems sometimes occur. Call your health care provider if you have any problems or questions after your procedure. WHAT TO EXPECT AFTER THE PROCEDURE  After your procedure:  You may feel sleepy, clumsy, and have poor balance for several hours.  Vomiting may occur if you eat too soon after the procedure. HOME CARE INSTRUCTIONS  Do not participate in any activities where you could become injured for at least 24 hours. Do not:  Drive.  Swim.  Ride a bicycle.  Operate heavy machinery.  Cook.  Use power tools.  Climb ladders.  Work from a high place.  Do not make important decisions or sign legal documents until you are improved.  If you vomit, drink water, juice, or soup when you can drink without vomiting. Make sure you have little or no nausea  before eating solid foods.  Only take over-the-counter or prescription medicines for pain, discomfort, or fever as directed by your health care provider.  Make sure you and your family fully understand everything about the medicines given to you, including what side effects may occur.  You should not drink alcohol, take sleeping pills, or take medicines  that cause drowsiness for at least 24 hours.  If you smoke, do not smoke without supervision.  If you are feeling better, you may resume normal activities 24 hours after you were sedated.  Keep all appointments with your health care provider. SEEK MEDICAL CARE IF:  Your skin is pale or bluish in color.  You continue to feel nauseous or vomit.  Your pain is getting worse and is not helped by medicine.  You have bleeding or swelling.  You are still sleepy or feeling clumsy after 24 hours. SEEK IMMEDIATE MEDICAL CARE IF:  You develop a rash.  You have difficulty breathing.  You develop any type of allergic problem.  You have a fever. MAKE SURE YOU:  Understand these instructions.  Will watch your condition.  Will get help right away if you are not doing well or get worse. Document Released: 12/07/2012 Document Reviewed: 12/07/2012 [facility] Patient Information 2015 [place], [place]. This information is not intended to replace advice given to you by your health care provider. Make sure you discuss any questions you have with your health care provider.  Conscious Sedation Sedation is the use of medicines to promote relaxation and relieve discomfort and anxiety. Conscious sedation is a type of sedation. Under conscious sedation you are less alert than normal but are still able to respond to instructions or stimulation. Conscious sedation is used during short medical and dental procedures. It is milder than deep sedation or general anesthesia and allows you to return to your regular activities sooner.  LET [facility] CARE PROVIDER KNOW ABOUT:   Any allergies you have.  All medicines you are taking, including vitamins, herbs, eye drops, creams, and over-the-counter medicines.  Use of steroids (by mouth or creams).  Previous problems you or members of your family have had with the use of anesthetics.  Any blood disorders you have.  Previous surgeries you have had.  Medical  conditions you have.  Possibility of pregnancy, if this applies.  Use of cigarettes, alcohol, or illegal drugs. RISKS AND COMPLICATIONS Generally, this is a safe procedure. However, as with any procedure, problems can occur. Possible problems include:  Oversedation.  Trouble breathing on your own. You may need to have a breathing tube until you are awake and breathing on your own.  Allergic reaction to any of the medicines used for the procedure. BEFORE THE PROCEDURE  You may have blood tests done. These tests can help show how well your kidneys and liver are working. They can also show how well your blood clots.  A physical exam will be done.  Only take medicines as directed by your health care provider. You may need to stop taking medicines (such as blood thinners, aspirin, or nonsteroidal anti-inflammatory drugs) before the procedure.   Do not eat or drink at least 6 hours before the procedure or as directed by your health care provider.  Arrange for a responsible adult, family member, or friend to take you home after the procedure. He or she should stay with you for at least 24 hours after the procedure, until the medicine has worn off. PROCEDURE   An intravenous (IV) catheter will be  inserted into one of your veins. Medicine will be able to flow directly into your body through this catheter. You may be given medicine through this tube to help prevent pain and help you relax.  The medical or dental procedure will be done. AFTER THE PROCEDURE  You will stay in a recovery area until the medicine has worn off. Your blood pressure and pulse will be checked.   Depending on the procedure you had, you may be allowed to go home when you can tolerate liquids and your pain is under control. Document Released: 11/11/2000 Document Revised: 02/21/2013 Document Reviewed: 10/24/2012 [facility] Patient Information 2015 [place], [place]. This information is not intended to replace advice  given to you by your health care provider. Make sure you discuss any questions you have with your health care provider.                 Conscious Sedation, Adult, Care After Refer to this sheet in the next few weeks. These instructions provide you with information on caring for yourself after your procedure. Your health care provider may also give you more specific instructions. Your treatment has been planned according to current medical practices, but problems sometimes occur. Call your health care provider if you have any problems or questions after your procedure. WHAT TO EXPECT AFTER THE PROCEDURE  After your procedure:  You may feel sleepy, clumsy, and have poor balance for several hours.  Vomiting may occur if you eat too soon after the procedure. HOME CARE INSTRUCTIONS  Do not participate in any activities where you could become injured for at least 24 hours. Do not:  Drive.  Swim.  Ride a bicycle.  Operate heavy machinery.  Cook.  Use power tools.  Climb ladders.  Work from a high place.  Do not make important decisions or sign legal documents until you are improved.  If you vomit, drink water, juice, or soup when you can drink without vomiting. Make sure you have little or no nausea before eating solid foods.  Only take over-the-counter or prescription medicines for pain, discomfort, or fever as directed by your health care provider.  Make sure you and your family fully understand everything about the medicines given to you, including what side effects may occur.  You should not drink alcohol, take sleeping pills, or take medicines that cause drowsiness for at least 24 hours.  If you smoke, do not smoke without supervision.  If you are feeling better, you may resume normal activities 24 hours after you were sedated.  Keep all appointments with your health care provider. SEEK MEDICAL CARE IF:  Your skin is pale or bluish in color.  You continue to feel  nauseous or vomit.  Your pain is getting worse and is not helped by medicine.  You have bleeding or swelling.  You are still sleepy or feeling clumsy after 24 hours. SEEK IMMEDIATE MEDICAL CARE IF:  You develop a rash.  You have difficulty breathing.  You develop any type of allergic problem.  You have a fever. MAKE SURE YOU:  Understand these instructions.  Will watch your condition.  Will get help right away if you are not doing well or get worse. Document Released: 12/07/2012 Document Reviewed: 12/07/2012 [facility] Patient Information 2015 [place], [place]. This information is not intended to replace advice given to you by your health care provider. Make sure you discuss any questions you have with your health care provider.

## 2014-10-17 NOTE — H&P (Signed)
HPI: Patient with leukopenia and thrombocytopenia who has been seen by Dr. Lindi Adie and is scheduled for a bone marrow biopsy today.   The patient has had a H&P performed within the last 30 days, all history, medications, and exam have been reviewed. The patient denies any interval changes since the H&P.  Medications: Prior to Admission medications   Medication Sig Start Date End Date Taking? Authorizing Provider  anastrozole (ARIMIDEX) 1 MG tablet Take 1 tablet (1 mg total) by mouth daily. Patient taking differently: Take 1 mg by mouth at bedtime.  07/18/14  Yes Nicholas Lose, MD  Calcium Carbonate-Vit D-Min (CALCIUM 1200 PO) Take 1 tablet by mouth every morning.   Yes Historical Provider, MD  cholecalciferol (VITAMIN D) 1000 UNITS tablet Take by mouth daily.    Yes Historical Provider, MD  cyanocobalamin 100 MCG tablet Take 100 mcg by mouth daily.   Yes Historical Provider, MD  fluocinonide-emollient (LIDEX-E) 0.05 % cream Apply 1 application topically daily as needed (welps and rash.).  01/04/13  Yes Historical Provider, MD  metoprolol succinate (TOPROL-XL) 100 MG 24 hr tablet Take 1 tablet (100 mg total) by mouth daily. Take with or immediately following a meal. Patient taking differently: Take 100 mg by mouth at bedtime. Take with or immediately following a meal. 06/27/14  Yes Peter M Martinique, MD  non-metallic deodorant Jethro Poling) MISC Apply 1 application topically daily as needed.   Yes Historical Provider, MD  vitamin C (ASCORBIC ACID) 500 MG tablet Take 500 mg by mouth every morning.    Yes Historical Provider, MD  vitamin E 200 UNIT capsule Take 200 Units by mouth every morning.    Yes Historical Provider, MD  HYDROcodone-acetaminophen (NORCO/VICODIN) 5-325 MG per tablet Take 1 tablet by mouth as needed. Patient taking differently: Take 1 tablet by mouth at bedtime as needed for moderate pain.  10/10/14   Nicholas Lose, MD  Pseudoephedrine-APAP-DM (DAYQUIL MULTI-SYMPTOM PO) Take 1 tablet by mouth  daily as needed (sinus.).    Historical Provider, MD     Vital Signs: BP 164/82 mmHg  Pulse 72  Temp(Src) 98.8 F (37.1 C) (Oral)  Resp 16  Ht 5' 5"  (1.651 m)  Wt 206 lb (93.441 kg)  BMI 34.28 kg/m2  SpO2 97%  Physical Exam  Constitutional: She is oriented to person, place, and time. No distress.  HENT:  Head: Normocephalic and atraumatic.  Cardiovascular: Normal rate and regular rhythm.  Exam reveals no gallop and no friction rub.   No murmur heard. Pulmonary/Chest: Effort normal and breath sounds normal. No respiratory distress. She has no wheezes. She has no rales.  Abdominal: Soft. Bowel sounds are normal. She exhibits no distension. There is no tenderness.  Neurological: She is alert and oriented to person, place, and time.  Skin: Skin is warm and dry. She is not diaphoretic.    Mallampati Score:  MD Evaluation Airway: WNL Heart: WNL Abdomen: WNL Chest/ Lungs: WNL ASA  Classification: 2 Mallampati/Airway Score: One  Labs:  CBC:  Recent Labs  04/11/14 1345 10/10/14 1022 10/17/14 0940  WBC 3.5* 2.8* 2.8*  HGB 13.8 13.8 13.0  HCT 42.8 41.4 40.4  PLT 83* 60* 58*    COAGS:  Recent Labs  10/17/14 0940  INR 1.00  APTT 29    BMP:  Recent Labs  04/11/14 1346 10/10/14 1022  NA 144 143  K 3.8 3.9  CO2 27 30*  GLUCOSE 91 97  BUN 9.6 11.0  CALCIUM 9.0 9.2  CREATININE 0.8  0.8    LIVER FUNCTION TESTS:  Recent Labs  04/11/14 1346 10/10/14 1022  BILITOT 0.68 0.72  AST 18 18  ALT 12 17  ALKPHOS 79 73  PROT 7.2 7.1  ALBUMIN 4.1 4.1    Assessment/Plan:  Breast cancer Thrombocytopenia Leukopenia Seen by Dr. Lindi Adie  Scheduled today for image guided bone marrow biopsy with sedation The patient has been NPO, no blood thinners taken, labs and vitals have been reviewed. Risks and Benefits discussed with the patient including, but not limited to bleeding, infection, damage to adjacent structures or low yield requiring additional tests. All of  the patient's questions were answered, patient is agreeable to proceed. Consent signed and in chart.   SignedHedy Jacob 10/17/2014, 11:33 AM

## 2014-10-23 DIAGNOSIS — G609 Hereditary and idiopathic neuropathy, unspecified: Secondary | ICD-10-CM | POA: Diagnosis not present

## 2014-10-23 DIAGNOSIS — M792 Neuralgia and neuritis, unspecified: Secondary | ICD-10-CM | POA: Diagnosis not present

## 2014-10-23 DIAGNOSIS — M79609 Pain in unspecified limb: Secondary | ICD-10-CM | POA: Diagnosis not present

## 2014-10-23 LAB — CHROMOSOME ANALYSIS, BONE MARROW

## 2014-10-23 LAB — TISSUE HYBRIDIZATION (BONE MARROW)-NCBH

## 2014-10-30 ENCOUNTER — Encounter: Payer: Self-pay | Admitting: *Deleted

## 2014-10-30 ENCOUNTER — Encounter (HOSPITAL_COMMUNITY): Payer: Self-pay

## 2014-10-30 NOTE — Progress Notes (Signed)
Received results from cytogenetic lab [facility], sent to scan

## 2014-11-07 ENCOUNTER — Encounter: Payer: Self-pay | Admitting: Hematology and Oncology

## 2014-11-07 ENCOUNTER — Ambulatory Visit (HOSPITAL_BASED_OUTPATIENT_CLINIC_OR_DEPARTMENT_OTHER): Payer: Medicare Other | Admitting: Hematology and Oncology

## 2014-11-07 ENCOUNTER — Telehealth: Payer: Self-pay | Admitting: Hematology and Oncology

## 2014-11-07 VITALS — BP 154/80 | HR 96 | Temp 98.3°F | Resp 18

## 2014-11-07 DIAGNOSIS — D72819 Decreased white blood cell count, unspecified: Secondary | ICD-10-CM | POA: Diagnosis not present

## 2014-11-07 DIAGNOSIS — C50212 Malignant neoplasm of upper-inner quadrant of left female breast: Secondary | ICD-10-CM

## 2014-11-07 DIAGNOSIS — D696 Thrombocytopenia, unspecified: Secondary | ICD-10-CM | POA: Diagnosis not present

## 2014-11-07 DIAGNOSIS — C50812 Malignant neoplasm of overlapping sites of left female breast: Secondary | ICD-10-CM | POA: Diagnosis not present

## 2014-11-07 DIAGNOSIS — D4620 Refractory anemia with excess of blasts, unspecified: Secondary | ICD-10-CM | POA: Insufficient documentation

## 2014-11-07 DIAGNOSIS — Z170 Estrogen receptor positive status [ER+]: Secondary | ICD-10-CM | POA: Diagnosis not present

## 2014-11-07 NOTE — Telephone Encounter (Signed)
Gave avs & calendar for December. °

## 2014-11-07 NOTE — Addendum Note (Signed)
Addended by: Prentiss Bells on: 11/07/2014 09:57 AM   Modules accepted: Medications

## 2014-11-07 NOTE — Assessment & Plan Note (Addendum)
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

## 2014-11-07 NOTE — Progress Notes (Signed)
Patient Care Team: Arvella Nigh, MD as PCP - General (Obstetrics and Gynecology)  DIAGNOSIS: Breast cancer of upper-inner quadrant of left female breast   Staging form: Breast, AJCC 7th Edition     Clinical stage from 12/16/2011: Stage IIA (T2, N0, cM0) - Unsigned     Pathologic: No stage assigned - Unsigned   SUMMARY OF ONCOLOGIC HISTORY:   Breast cancer of upper-inner quadrant of left female breast   12/31/2011 - 03/11/2012 Neo-Adjuvant Chemotherapy [place] Perjeta 4 followed by Herceptin maintenance completed 01/29/2013   04/06/2012 Surgery Left breast mastectomy: IDC grade 1; 0.8 cm, low-grade DCIS, LV I identified, 1/2 lymph node Micro met, ER/PR positive HER-2 positive Ki-67 94%   05/04/2012 - 06/20/2012 Radiation Therapy Adjuvant radiation therapy   07/29/2012 -  Anti-estrogen oral therapy Arimidex 1 mg daily   10/17/2014 Procedure Bone marrow biopsy for thrombocytopenia and leukopenia: Hypercellular marrow with trilineage dysplasia most apparent in megakaryocytes with associated myelofibrosis: low-grade MDS with refractory cytopenias and multilineage dysplasia (20q- partial del)    MDS (myelodysplastic syndrome), low grade   10/17/2014 Initial Diagnosis MDS (myelodysplastic syndrome), low grade , refractory cytopenia with multi nature dysplasia cytogenetics -20q (associated with good prognosis MDS)    CHIEF COMPLIANT:  Follow-up of bone marrow biopsy  INTERVAL HISTORY: Marie Anderson is a  69 year old lady with above-mentioned history of left breast cancer with persistent cytopenias and we recently performed a bone marrow biopsy and she is here today to discuss the results. She denies any excessive bruising or bleeding. Her platelet counts had gone down to 58. Her white count is stable at 2.8. She reports no problems with fevers or chills. Denies any lumps or nodules in the breasts.  REVIEW OF SYSTEMS:   Constitutional: Denies fevers, chills or abnormal weight loss Eyes: Denies blurriness of  vision Ears, nose, mouth, throat, and face: Denies mucositis or sore throat Respiratory: Denies cough, dyspnea or wheezes Cardiovascular: Denies palpitation, chest discomfort or lower extremity swelling Gastrointestinal:  Denies nausea, heartburn or change in bowel habits Skin: Denies abnormal skin rashes Lymphatics: Denies new lymphadenopathy or easy bruising Neurological:Denies numbness, tingling or new weaknesses Behavioral/Psych: Mood is stable, no new changes   All other systems were reviewed with the patient and are negative.  I have reviewed the past medical history, past surgical history, social history and family history with the patient and they are unchanged from previous note.  ALLERGIES:  is allergic to cephalosporins; codeine; and tape.  MEDICATIONS:  Current Outpatient Prescriptions  Medication Sig Dispense Refill  . anastrozole (ARIMIDEX) 1 MG tablet Take 1 tablet (1 mg total) by mouth daily. (Patient taking differently: Take 1 mg by mouth at bedtime. ) 90 tablet 4  . Calcium Carbonate-Vit D-Min (CALCIUM 1200 PO) Take 1 tablet by mouth every morning.    . cholecalciferol (VITAMIN D) 1000 UNITS tablet Take by mouth daily.     . cyanocobalamin 100 MCG tablet Take 100 mcg by mouth daily.    . fluocinonide-emollient (LIDEX-E) 0.05 % cream Apply 1 application topically daily as needed (welps and rash.).     . HYDROcodone-acetaminophen (NORCO/VICODIN) 5-325 MG per tablet Take 1 tablet by mouth as needed. (Patient taking differently: Take 1 tablet by mouth at bedtime as needed for moderate pain. ) 30 tablet 0  . metoprolol succinate (TOPROL-XL) 100 MG 24 hr tablet Take 1 tablet (100 mg total) by mouth daily. Take with or immediately following a meal. (Patient taking differently: Take 100 mg by mouth at bedtime.  Take with or immediately following a meal.) 90 tablet 3  . non-metallic deodorant (ALRA) MISC Apply 1 application topically daily as needed.    . Pseudoephedrine-APAP-DM  (DAYQUIL MULTI-SYMPTOM PO) Take 1 tablet by mouth daily as needed (sinus.).    Marland Kitchen vitamin C (ASCORBIC ACID) 500 MG tablet Take 500 mg by mouth every morning.     . vitamin E 200 UNIT capsule Take 200 Units by mouth every morning.      No current facility-administered medications for this visit.    PHYSICAL EXAMINATION: ECOG PERFORMANCE STATUS: 1 - Symptomatic but completely ambulatory  Filed Vitals:   11/07/14 0922  BP: 154/80  Pulse: 96  Temp: 98.3 F (36.8 C)  Resp: 18   Filed Weights    GENERAL:alert, no distress and comfortable SKIN: skin color, texture, turgor are normal, no rashes or significant lesions EYES: normal, Conjunctiva are pink and non-injected, sclera clear OROPHARYNX:no exudate, no erythema and lips, buccal mucosa, and tongue normal  NECK: supple, thyroid normal size, non-tender, without nodularity LYMPH:  no palpable lymphadenopathy in the cervical, axillary or inguinal LUNGS: clear to auscultation and percussion with normal breathing effort HEART: regular rate & rhythm and no murmurs and no lower extremity edema ABDOMEN:abdomen soft, non-tender and normal bowel sounds Musculoskeletal:no cyanosis of digits and no clubbing  NEURO: alert & oriented x 3 with fluent speech, no focal motor/sensory deficits  LABORATORY DATA:  I have reviewed the data as listed   Chemistry      Component Value Date/Time   NA 143 10/10/2014 1022   NA 143 03/31/2012 1200   K 3.9 10/10/2014 1022   K 3.7 03/31/2012 1200   CL 109* 08/19/2012 1021   CL 107 03/31/2012 1200   CO2 30* 10/10/2014 1022   CO2 29 03/31/2012 1200   BUN 11.0 10/10/2014 1022   BUN 7 03/31/2012 1200   CREATININE 0.8 10/10/2014 1022   CREATININE 0.67 03/31/2012 1200      Component Value Date/Time   CALCIUM 9.2 10/10/2014 1022   CALCIUM 8.6 03/31/2012 1200   ALKPHOS 73 10/10/2014 1022   AST 18 10/10/2014 1022   ALT 17 10/10/2014 1022   BILITOT 0.72 10/10/2014 1022       Lab Results  Component  Value Date   WBC 2.8* 10/17/2014   HGB 13.0 10/17/2014   HCT 40.4 10/17/2014   MCV 99.3 10/17/2014   PLT 58* 10/17/2014   NEUTROABS 1.6 10/10/2014   ASSESSMENT & PLAN:  MDS (myelodysplastic syndrome), low grade Thrombocytopenia and leukopenia: Bone marrow biopsy 10/17/2014 suggestive of low-grade MDS refractory cytopenia with multilineage dysplasia cytogenetics revealed 20 q- suggestive of good prognosis MDS, also associated with myelofibrosis, no increase in blasts, flow cytometry normal  Recommendation: Observation  Breast cancer of upper-inner quadrant of left female breast Left breast invasive ductal carcinoma ER/PR positive HER-2 negative Ki-67 94% diagnosed October 2013 status post 4 cycles of TCH Perjeta followed by left mastectomy which revealed a 0.8 cm grade 1 IDC, 1 sentinel node micrometastatic disease ER/PR 100% positive status post radiation therapy and completed adjuvant trastuzumab 01/12/2013 currently on Arimidex 1 mg daily from 07/29/2012  Arimidex toxicities: 1. Occasional hot flashes 2. Muscle stiffness in the morning 3. Patient gets bone densities every other year and they have been normal according to her   Orders Placed This Encounter  Procedures  . CBC with Differential    Standing Status: Future     Number of Occurrences:      Standing Expiration  Date: 11/07/2015   The patient has a good understanding of the overall plan. she agrees with it. she will call with any problems that may develop before the next visit here.   Rulon Eisenmenger, MD

## 2014-11-07 NOTE — Assessment & Plan Note (Signed)
Left breast invasive ductal carcinoma ER/PR positive HER-2 negative Ki-67 94% diagnosed October 2013 status post 4 cycles of [place] followed by left mastectomy which revealed a 0.8 cm grade 1 IDC, 1 sentinel node micrometastatic disease ER/PR 100% positive status post radiation therapy and completed adjuvant trastuzumab 01/12/2013 currently on Arimidex 1 mg daily from 07/29/2012  Arimidex toxicities: 1. Occasional hot flashes 2. Muscle stiffness in the morning 3. Patient gets bone densities every other year and they have been normal according to her

## 2014-11-09 ENCOUNTER — Ambulatory Visit: Payer: Medicare Other | Admitting: Hematology and Oncology

## 2014-11-13 DIAGNOSIS — M792 Neuralgia and neuritis, unspecified: Secondary | ICD-10-CM | POA: Diagnosis not present

## 2014-11-19 DIAGNOSIS — N950 Postmenopausal bleeding: Secondary | ICD-10-CM | POA: Diagnosis not present

## 2014-11-29 DIAGNOSIS — N950 Postmenopausal bleeding: Secondary | ICD-10-CM | POA: Diagnosis not present

## 2014-12-13 DIAGNOSIS — G2581 Restless legs syndrome: Secondary | ICD-10-CM | POA: Diagnosis not present

## 2014-12-13 DIAGNOSIS — G609 Hereditary and idiopathic neuropathy, unspecified: Secondary | ICD-10-CM | POA: Diagnosis not present

## 2014-12-13 DIAGNOSIS — R201 Hypoesthesia of skin: Secondary | ICD-10-CM | POA: Diagnosis not present

## 2014-12-13 DIAGNOSIS — M5417 Radiculopathy, lumbosacral region: Secondary | ICD-10-CM | POA: Diagnosis not present

## 2014-12-13 DIAGNOSIS — G603 Idiopathic progressive neuropathy: Secondary | ICD-10-CM | POA: Diagnosis not present

## 2014-12-13 DIAGNOSIS — R202 Paresthesia of skin: Secondary | ICD-10-CM | POA: Diagnosis not present

## 2014-12-13 DIAGNOSIS — E559 Vitamin D deficiency, unspecified: Secondary | ICD-10-CM | POA: Diagnosis not present

## 2014-12-13 DIAGNOSIS — R634 Abnormal weight loss: Secondary | ICD-10-CM | POA: Diagnosis not present

## 2014-12-13 DIAGNOSIS — G5603 Carpal tunnel syndrome, bilateral upper limbs: Secondary | ICD-10-CM | POA: Diagnosis not present

## 2014-12-13 DIAGNOSIS — R7301 Impaired fasting glucose: Secondary | ICD-10-CM | POA: Diagnosis not present

## 2014-12-19 DIAGNOSIS — R7989 Other specified abnormal findings of blood chemistry: Secondary | ICD-10-CM | POA: Diagnosis not present

## 2015-02-06 ENCOUNTER — Other Ambulatory Visit: Payer: Self-pay | Admitting: *Deleted

## 2015-02-06 ENCOUNTER — Ambulatory Visit (HOSPITAL_BASED_OUTPATIENT_CLINIC_OR_DEPARTMENT_OTHER): Payer: Medicare Other | Admitting: Hematology and Oncology

## 2015-02-06 ENCOUNTER — Other Ambulatory Visit (HOSPITAL_BASED_OUTPATIENT_CLINIC_OR_DEPARTMENT_OTHER): Payer: Medicare Other

## 2015-02-06 ENCOUNTER — Encounter: Payer: Self-pay | Admitting: Hematology and Oncology

## 2015-02-06 ENCOUNTER — Telehealth: Payer: Self-pay | Admitting: Hematology and Oncology

## 2015-02-06 VITALS — BP 150/71 | HR 70 | Temp 98.2°F | Resp 18 | Ht 65.0 in | Wt 209.1 lb

## 2015-02-06 DIAGNOSIS — Z170 Estrogen receptor positive status [ER+]: Secondary | ICD-10-CM | POA: Diagnosis not present

## 2015-02-06 DIAGNOSIS — D4620 Refractory anemia with excess of blasts, unspecified: Secondary | ICD-10-CM

## 2015-02-06 DIAGNOSIS — C50212 Malignant neoplasm of upper-inner quadrant of left female breast: Secondary | ICD-10-CM

## 2015-02-06 DIAGNOSIS — C773 Secondary and unspecified malignant neoplasm of axilla and upper limb lymph nodes: Secondary | ICD-10-CM | POA: Diagnosis not present

## 2015-02-06 LAB — CBC WITH DIFFERENTIAL/PLATELET
BASO%: 0.7 % (ref 0.0–2.0)
BASOS ABS: 0 10*3/uL (ref 0.0–0.1)
EOS ABS: 0 10*3/uL (ref 0.0–0.5)
EOS%: 1.6 % (ref 0.0–7.0)
HCT: 41.3 % (ref 34.8–46.6)
HGB: 13.6 g/dL (ref 11.6–15.9)
LYMPH%: 31.7 % (ref 14.0–49.7)
MCH: 32.7 pg (ref 25.1–34.0)
MCHC: 32.8 g/dL (ref 31.5–36.0)
MCV: 99.7 fL (ref 79.5–101.0)
MONO#: 0.2 10*3/uL (ref 0.1–0.9)
MONO%: 8.8 % (ref 0.0–14.0)
NEUT#: 1.4 10*3/uL — ABNORMAL LOW (ref 1.5–6.5)
NEUT%: 57.2 % (ref 38.4–76.8)
PLATELETS: 71 10*3/uL — AB (ref 145–400)
RBC: 4.15 10*6/uL (ref 3.70–5.45)
RDW: 14.3 % (ref 11.2–14.5)
WBC: 2.5 10*3/uL — AB (ref 3.9–10.3)
lymph#: 0.8 10*3/uL — ABNORMAL LOW (ref 0.9–3.3)

## 2015-02-06 NOTE — Assessment & Plan Note (Signed)
Thrombocytopenia and leukopenia: Bone marrow biopsy 10/17/2014 suggestive of low-grade MDS refractory cytopenia with multilineage dysplasia cytogenetics revealed 20 q- suggestive of good prognosis MDS, also associated with myelofibrosis, no increase in blasts, flow cytometry normal  Today's blood work was reviewed which showed white count of 2.5 with an ANC of 1.4, hemoglobin 13.6, platelets 71. Overall the numbers appear to be stable over the past 3 months. Patient requested second opinion consultation for the MDS at [facility]. She would like to see Dr. Laverta [place]. I will send for that referral.  Return to clinic in 3 months with lab work and follow up

## 2015-02-06 NOTE — Assessment & Plan Note (Signed)
Left breast invasive ductal carcinoma ER/PR positive HER-2 negative Ki-67 94% diagnosed October 2013 status post 4 cycles of [place] followed by left mastectomy which revealed a 0.8 cm grade 1 IDC, 1 sentinel node micrometastatic disease ER/PR 100% positive status post radiation therapy and completed adjuvant trastuzumab 01/12/2013 currently on Arimidex 1 mg daily from 07/29/2012  Arimidex toxicities: 1. Occasional hot flashes 2. Muscle stiffness in the morning 3. Patient gets bone densities every other year and they have been normal according to her  Breast cancer surveillance: Patient will need right breast mammograms

## 2015-02-06 NOTE — Progress Notes (Signed)
Patient Care Team: Arvella Nigh, MD as PCP - General (Obstetrics and Gynecology)  DIAGNOSIS: Breast cancer of upper-inner quadrant of left female breast [facility])   Staging form: Breast, AJCC 7th Edition     Clinical stage from 12/16/2011: Stage IIA (T2, N0, cM0) - Unsigned     Pathologic: No stage assigned - Unsigned   SUMMARY OF ONCOLOGIC HISTORY:   Breast cancer of upper-inner quadrant of left female breast ([place])   12/31/2011 - 03/11/2012 Neo-Adjuvant Chemotherapy Taxotere Herceptin Perjeta 4 followed by Herceptin maintenance completed 01/29/2013   04/06/2012 Surgery Left breast mastectomy: IDC grade 1; 0.8 cm, low-grade DCIS, LV I identified, 1/2 lymph node Micro met, ER/PR positive HER-2 positive Ki-67 94%   05/04/2012 - 06/20/2012 Radiation Therapy Adjuvant radiation therapy   07/29/2012 -  Anti-estrogen oral therapy Arimidex 1 mg daily   10/17/2014 Procedure Bone marrow biopsy for thrombocytopenia and leukopenia: Hypercellular marrow with trilineage dysplasia most apparent in megakaryocytes with associated myelofibrosis: low-grade MDS with refractory cytopenias and multilineage dysplasia (20q- partial del)    MDS (myelodysplastic syndrome), low grade ([place])   10/17/2014 Initial Diagnosis MDS (myelodysplastic syndrome), low grade , refractory cytopenia with multi nature dysplasia cytogenetics -20q (associated with good prognosis MDS)    CHIEF COMPLIANT: follow-up of left breast cancer and MDS  INTERVAL HISTORY: Marie Anderson is a 69 year old lady with above-mentioned history of left breast cancer treated with neoadjuvant chemotherapy with Taxotere Herceptin Perjeta followed by left mastectomy followed by adjuvant radiation. She is currently on Arimidex therapy tolerating it very well. She had pancytopenia for which she underwent a bone marrow biopsy which suggested low-grade MDS with -20q. She appears to be doing quite well without any major problems or concerns.denies any lumps or nodules in  the breasts. She says she was diagnosed with neuropathy and was started on Neurontin. She was referred to endocrinology because she had positive ANA.  REVIEW OF SYSTEMS:   Constitutional: Denies fevers, chills or abnormal weight loss Eyes: Denies blurriness of vision Ears, nose, mouth, throat, and face: Denies mucositis or sore throat Respiratory: Denies cough, dyspnea or wheezes Cardiovascular: Denies palpitation, chest discomfort or lower extremity swelling Gastrointestinal:  Denies nausea, heartburn or change in bowel habits Skin: Denies abnormal skin rashes Lymphatics: Denies new lymphadenopathy or easy bruising Neurological:neuropathy in the feet Behavioral/Psych: Mood is stable, no new changes  Breast:  denies any pain or lumps or nodules in either breasts All other systems were reviewed with the patient and are negative.  I have reviewed the past medical history, past surgical history, social history and family history with the patient and they are unchanged from previous note.  ALLERGIES:  is allergic to cephalosporins; codeine; and tape.  MEDICATIONS:  Current Outpatient Prescriptions  Medication Sig Dispense Refill  . anastrozole (ARIMIDEX) 1 MG tablet Take 1 tablet (1 mg total) by mouth daily. (Patient taking differently: Take 1 mg by mouth at bedtime. ) 90 tablet 4  . Calcium Carbonate-Vit D-Min (CALCIUM 1200 PO) Take 1 tablet by mouth every morning.    . cholecalciferol (VITAMIN D) 1000 UNITS tablet Take by mouth daily.     . cyanocobalamin 100 MCG tablet Take 100 mcg by mouth daily.    . fluocinonide-emollient (LIDEX-E) 0.05 % cream Apply 1 application topically daily as needed (welps and rash.).     . HYDROcodone-acetaminophen (NORCO/VICODIN) 5-325 MG per tablet Take 1 tablet by mouth as needed. (Patient taking differently: Take 1 tablet by mouth at bedtime as needed for moderate  pain. ) 30 tablet 0  . metoprolol succinate (TOPROL-XL) 100 MG 24 hr tablet Take 1 tablet (100  mg total) by mouth daily. Take with or immediately following a meal. (Patient taking differently: Take 100 mg by mouth at bedtime. Take with or immediately following a meal.) 90 tablet 3  . non-metallic deodorant (ALRA) MISC Apply 1 application topically daily as needed.    . Pseudoephedrine-APAP-DM (DAYQUIL MULTI-SYMPTOM PO) Take 1 tablet by mouth daily as needed (sinus.).    Marland Kitchen vitamin C (ASCORBIC ACID) 500 MG tablet Take 500 mg by mouth every morning.     . vitamin E 200 UNIT capsule Take 200 Units by mouth every morning.      No current facility-administered medications for this visit.    PHYSICAL EXAMINATION: ECOG PERFORMANCE STATUS: 1 - Symptomatic but completely ambulatory  Filed Vitals:   02/06/15 0957  BP: 150/71  Pulse: 70  Temp: 98.2 F (36.8 C)  Resp: 18   Filed Weights   02/06/15 0957  Weight: 209 lb 1.6 oz (94.847 kg)    GENERAL:alert, no distress and comfortable SKIN: skin color, texture, turgor are normal, no rashes or significant lesions EYES: normal, Conjunctiva are pink and non-injected, sclera clear OROPHARYNX:no exudate, no erythema and lips, buccal mucosa, and tongue normal  NECK: supple, thyroid normal size, non-tender, without nodularity LYMPH:  no palpable lymphadenopathy in the cervical, axillary or inguinal LUNGS: clear to auscultation and percussion with normal breathing effort HEART: regular rate & rhythm and no murmurs and no lower extremity edema ABDOMEN:abdomen soft, non-tender and normal bowel sounds Musculoskeletal:no cyanosis of digits and no clubbing  NEURO: alert & oriented x 3 with fluent speech, grade 1 peripheral neuropathy  LABORATORY DATA:  I have reviewed the data as listed   Chemistry      Component Value Date/Time   NA 143 10/10/2014 1022   NA 143 03/31/2012 1200   K 3.9 10/10/2014 1022   K 3.7 03/31/2012 1200   CL 109* 08/19/2012 1021   CL 107 03/31/2012 1200   CO2 30* 10/10/2014 1022   CO2 29 03/31/2012 1200   BUN 11.0  10/10/2014 1022   BUN 7 03/31/2012 1200   CREATININE 0.8 10/10/2014 1022   CREATININE 0.67 03/31/2012 1200      Component Value Date/Time   CALCIUM 9.2 10/10/2014 1022   CALCIUM 8.6 03/31/2012 1200   ALKPHOS 73 10/10/2014 1022   AST 18 10/10/2014 1022   ALT 17 10/10/2014 1022   BILITOT 0.72 10/10/2014 1022       Lab Results  Component Value Date   WBC 2.5* 02/06/2015   HGB 13.6 02/06/2015   HCT 41.3 02/06/2015   MCV 99.7 02/06/2015   PLT 71* 02/06/2015   NEUTROABS 1.4* 02/06/2015   ASSESSMENT & PLAN:  Breast cancer of upper-inner quadrant of left female breast Left breast invasive ductal carcinoma ER/PR positive HER-2 negative Ki-67 94% diagnosed October 2013 status post 4 cycles of TCH Perjeta followed by left mastectomy which revealed a 0.8 cm grade 1 IDC, 1 sentinel node micrometastatic disease ER/PR 100% positive status post radiation therapy and completed adjuvant trastuzumab 01/12/2013 currently on Arimidex 1 mg daily from 07/29/2012  Arimidex toxicities: 1. Occasional hot flashes 2. Muscle stiffness in the morning 3. Patient gets bone densities every other year and they have been normal according to her  Breast cancer surveillance: Patient will need right breast mammograms  MDS (myelodysplastic syndrome), low grade Thrombocytopenia and leukopenia: Bone marrow biopsy 10/17/2014 suggestive of  low-grade MDS refractory cytopenia with multilineage dysplasia cytogenetics revealed 20 q- suggestive of good prognosis MDS, also associated with myelofibrosis, no increase in blasts, flow cytometry normal  Today's blood work was reviewed which showed white count of 2.5 with an ANC of 1.4, hemoglobin 13.6, platelets 71. Overall the numbers appear to be stable over the past 3 months. Patient requested second opinion consultation for the MDS at [facility]. She would like to see Dr. Laverta [place]. I will send for that referral.  Return to clinic in 3 months with lab work and follow  up    Orders Placed This Encounter  Procedures  . CBC with Differential    Standing Status: Future     Number of Occurrences:      Standing Expiration Date: 02/06/2016  . Comprehensive metabolic panel    Standing Status: Future     Number of Occurrences:      Standing Expiration Date: 02/06/2016   The patient has a good understanding of the overall plan. she agrees with it. she will call with any problems that may develop before the next visit here.   Rulon Eisenmenger, MD 02/06/2015

## 2015-02-06 NOTE — Telephone Encounter (Signed)
appointments made and avs printed for pateint °

## 2015-02-07 DIAGNOSIS — Z01419 Encounter for gynecological examination (general) (routine) without abnormal findings: Secondary | ICD-10-CM | POA: Diagnosis not present

## 2015-02-07 DIAGNOSIS — Z6834 Body mass index (BMI) 34.0-34.9, adult: Secondary | ICD-10-CM | POA: Diagnosis not present

## 2015-02-07 DIAGNOSIS — Z1231 Encounter for screening mammogram for malignant neoplasm of breast: Secondary | ICD-10-CM | POA: Diagnosis not present

## 2015-02-08 ENCOUNTER — Telehealth: Payer: Self-pay | Admitting: Hematology and Oncology

## 2015-02-08 NOTE — Telephone Encounter (Signed)
Spoke w/Lakesiha @ Dr. Laverta [place] office per staff MD is out of office until Monday will review information upon return.  Medical record faxed [phone] Release ID [id number] pgs

## 2015-02-12 ENCOUNTER — Other Ambulatory Visit: Payer: Self-pay | Admitting: Oncology

## 2015-02-12 DIAGNOSIS — R928 Other abnormal and inconclusive findings on diagnostic imaging of breast: Secondary | ICD-10-CM

## 2015-02-13 DIAGNOSIS — G5603 Carpal tunnel syndrome, bilateral upper limbs: Secondary | ICD-10-CM | POA: Diagnosis not present

## 2015-02-13 DIAGNOSIS — G603 Idiopathic progressive neuropathy: Secondary | ICD-10-CM | POA: Diagnosis not present

## 2015-02-13 DIAGNOSIS — M5417 Radiculopathy, lumbosacral region: Secondary | ICD-10-CM | POA: Diagnosis not present

## 2015-02-13 DIAGNOSIS — G2581 Restless legs syndrome: Secondary | ICD-10-CM | POA: Diagnosis not present

## 2015-02-14 ENCOUNTER — Other Ambulatory Visit: Payer: Self-pay | Admitting: Obstetrics and Gynecology

## 2015-02-14 DIAGNOSIS — R928 Other abnormal and inconclusive findings on diagnostic imaging of breast: Secondary | ICD-10-CM

## 2015-02-15 ENCOUNTER — Ambulatory Visit
Admission: RE | Admit: 2015-02-15 | Discharge: 2015-02-15 | Disposition: A | Discharge status: Home / Self Care | Payer: Medicare Other | Source: Ambulatory Visit | Attending: Obstetrics and Gynecology | Admitting: Obstetrics and Gynecology

## 2015-02-15 DIAGNOSIS — R928 Other abnormal and inconclusive findings on diagnostic imaging of breast: Secondary | ICD-10-CM | POA: Diagnosis not present

## 2015-02-19 ENCOUNTER — Inpatient Hospital Stay: Admission: RE | Admit: 2015-02-19 | Payer: Medicare Other | Source: Ambulatory Visit

## 2015-03-06 ENCOUNTER — Other Ambulatory Visit: Payer: Self-pay | Admitting: Obstetrics and Gynecology

## 2015-03-06 DIAGNOSIS — R928 Other abnormal and inconclusive findings on diagnostic imaging of breast: Secondary | ICD-10-CM

## 2015-03-13 DIAGNOSIS — D4620 Refractory anemia with excess of blasts, unspecified: Secondary | ICD-10-CM | POA: Diagnosis not present

## 2015-03-13 DIAGNOSIS — D469 Myelodysplastic syndrome, unspecified: Secondary | ICD-10-CM | POA: Diagnosis not present

## 2015-03-14 ENCOUNTER — Ambulatory Visit
Admission: RE | Admit: 2015-03-14 | Discharge: 2015-03-14 | Disposition: A | Discharge status: Home / Self Care | Payer: Medicare Other | Source: Ambulatory Visit | Attending: Obstetrics and Gynecology | Admitting: Obstetrics and Gynecology

## 2015-03-14 DIAGNOSIS — R928 Other abnormal and inconclusive findings on diagnostic imaging of breast: Secondary | ICD-10-CM

## 2015-03-14 DIAGNOSIS — R921 Mammographic calcification found on diagnostic imaging of breast: Secondary | ICD-10-CM | POA: Diagnosis not present

## 2015-03-14 DIAGNOSIS — N641 Fat necrosis of breast: Secondary | ICD-10-CM | POA: Diagnosis not present

## 2015-03-19 DIAGNOSIS — D469 Myelodysplastic syndrome, unspecified: Secondary | ICD-10-CM | POA: Diagnosis not present

## 2015-03-19 DIAGNOSIS — C9000 Multiple myeloma not having achieved remission: Secondary | ICD-10-CM | POA: Diagnosis not present

## 2015-03-26 DIAGNOSIS — Z79899 Other long term (current) drug therapy: Secondary | ICD-10-CM | POA: Diagnosis not present

## 2015-03-26 DIAGNOSIS — R5383 Other fatigue: Secondary | ICD-10-CM | POA: Diagnosis not present

## 2015-03-26 DIAGNOSIS — R5381 Other malaise: Secondary | ICD-10-CM | POA: Diagnosis not present

## 2015-03-26 DIAGNOSIS — G5601 Carpal tunnel syndrome, right upper limb: Secondary | ICD-10-CM | POA: Diagnosis not present

## 2015-03-26 DIAGNOSIS — R200 Anesthesia of skin: Secondary | ICD-10-CM | POA: Diagnosis not present

## 2015-03-26 DIAGNOSIS — E559 Vitamin D deficiency, unspecified: Secondary | ICD-10-CM | POA: Diagnosis not present

## 2015-03-26 DIAGNOSIS — M3500 Sicca syndrome, unspecified: Secondary | ICD-10-CM | POA: Diagnosis not present

## 2015-03-26 DIAGNOSIS — R300 Dysuria: Secondary | ICD-10-CM | POA: Diagnosis not present

## 2015-03-26 DIAGNOSIS — M2550 Pain in unspecified joint: Secondary | ICD-10-CM | POA: Diagnosis not present

## 2015-03-27 ENCOUNTER — Telehealth: Payer: Self-pay | Admitting: Radiology

## 2015-03-27 DIAGNOSIS — D225 Melanocytic nevi of trunk: Secondary | ICD-10-CM | POA: Diagnosis not present

## 2015-03-27 DIAGNOSIS — Z23 Encounter for immunization: Secondary | ICD-10-CM | POA: Diagnosis not present

## 2015-03-27 DIAGNOSIS — Z86018 Personal history of other benign neoplasm: Secondary | ICD-10-CM | POA: Diagnosis not present

## 2015-03-27 DIAGNOSIS — L729 Follicular cyst of the skin and subcutaneous tissue, unspecified: Secondary | ICD-10-CM | POA: Diagnosis not present

## 2015-03-27 DIAGNOSIS — L821 Other seborrheic keratosis: Secondary | ICD-10-CM | POA: Diagnosis not present

## 2015-03-27 DIAGNOSIS — Z85828 Personal history of other malignant neoplasm of skin: Secondary | ICD-10-CM | POA: Diagnosis not present

## 2015-04-26 DIAGNOSIS — D691 Qualitative platelet defects: Secondary | ICD-10-CM | POA: Diagnosis not present

## 2015-04-26 DIAGNOSIS — G5601 Carpal tunnel syndrome, right upper limb: Secondary | ICD-10-CM | POA: Diagnosis not present

## 2015-04-26 DIAGNOSIS — M3500 Sicca syndrome, unspecified: Secondary | ICD-10-CM | POA: Diagnosis not present

## 2015-04-26 DIAGNOSIS — R5383 Other fatigue: Secondary | ICD-10-CM | POA: Diagnosis not present

## 2015-04-30 DIAGNOSIS — Z23 Encounter for immunization: Secondary | ICD-10-CM | POA: Diagnosis not present

## 2015-05-04 NOTE — Telephone Encounter (Signed)
Error

## 2015-05-07 NOTE — Assessment & Plan Note (Signed)
Thrombocytopenia and leukopenia: Bone marrow biopsy 10/17/2014 suggestive of low-grade MDS refractory cytopenia with multilineage dysplasia cytogenetics revealed 20 q- suggestive of good prognosis MDS, also associated with myelofibrosis, no increase in blasts, flow cytometry normal  Today's blood work was reviewed which showed white count of 2.5 with an ANC of 1.4, hemoglobin 13.6, platelets 71. Overall the numbers appear to be stable over the past 3 months. Patient requested second opinion consultation for the MDS at [facility]

## 2015-05-07 NOTE — Assessment & Plan Note (Signed)
Left breast invasive ductal carcinoma ER/PR positive HER-2 negative Ki-67 94% diagnosed October 2013 status post 4 cycles of [place] followed by left mastectomy which revealed a 0.8 cm grade 1 IDC, 1 sentinel node micrometastatic disease ER/PR 100% positive status post radiation therapy and completed adjuvant trastuzumab 01/12/2013 currently on Arimidex 1 mg daily from 07/29/2012  Arimidex toxicities: 1. Occasional hot flashes 2. Muscle stiffness in the morning 3. Patient gets bone densities every other year and they have been normal according to her  Breast cancer surveillance: Patient will need right breast mammograms

## 2015-05-08 ENCOUNTER — Telehealth: Payer: Self-pay | Admitting: Hematology and Oncology

## 2015-05-08 ENCOUNTER — Other Ambulatory Visit (HOSPITAL_BASED_OUTPATIENT_CLINIC_OR_DEPARTMENT_OTHER): Payer: Medicare Other

## 2015-05-08 ENCOUNTER — Ambulatory Visit (HOSPITAL_BASED_OUTPATIENT_CLINIC_OR_DEPARTMENT_OTHER): Payer: Medicare Other | Admitting: Hematology and Oncology

## 2015-05-08 ENCOUNTER — Other Ambulatory Visit: Payer: BLUE CROSS/BLUE SHIELD

## 2015-05-08 ENCOUNTER — Encounter: Payer: Self-pay | Admitting: Hematology and Oncology

## 2015-05-08 VITALS — BP 157/78 | HR 76 | Temp 98.2°F | Resp 18 | Ht 65.0 in | Wt 210.3 lb

## 2015-05-08 DIAGNOSIS — G4700 Insomnia, unspecified: Secondary | ICD-10-CM | POA: Insufficient documentation

## 2015-05-08 DIAGNOSIS — C50212 Malignant neoplasm of upper-inner quadrant of left female breast: Secondary | ICD-10-CM

## 2015-05-08 DIAGNOSIS — Z170 Estrogen receptor positive status [ER+]: Secondary | ICD-10-CM | POA: Diagnosis not present

## 2015-05-08 DIAGNOSIS — I471 Supraventricular tachycardia: Secondary | ICD-10-CM

## 2015-05-08 DIAGNOSIS — M3500 Sicca syndrome, unspecified: Secondary | ICD-10-CM | POA: Diagnosis not present

## 2015-05-08 DIAGNOSIS — D4620 Refractory anemia with excess of blasts, unspecified: Secondary | ICD-10-CM | POA: Diagnosis not present

## 2015-05-08 LAB — CBC WITH DIFFERENTIAL/PLATELET
BASO%: 0.3 % (ref 0.0–2.0)
Basophils Absolute: 0 10*3/uL (ref 0.0–0.1)
EOS%: 1 % (ref 0.0–7.0)
Eosinophils Absolute: 0 10*3/uL (ref 0.0–0.5)
HCT: 41 % (ref 34.8–46.6)
HEMOGLOBIN: 13.6 g/dL (ref 11.6–15.9)
LYMPH%: 28 % (ref 14.0–49.7)
MCH: 32.9 pg (ref 25.1–34.0)
MCHC: 33.2 g/dL (ref 31.5–36.0)
MCV: 99.3 fL (ref 79.5–101.0)
MONO#: 0.3 10*3/uL (ref 0.1–0.9)
MONO%: 10.2 % (ref 0.0–14.0)
NEUT%: 60.5 % (ref 38.4–76.8)
NEUTROS ABS: 1.8 10*3/uL (ref 1.5–6.5)
Platelets: 61 10*3/uL — ABNORMAL LOW (ref 145–400)
RBC: 4.13 10*6/uL (ref 3.70–5.45)
RDW: 14.1 % (ref 11.2–14.5)
WBC: 2.9 10*3/uL — AB (ref 3.9–10.3)
lymph#: 0.8 10*3/uL — ABNORMAL LOW (ref 0.9–3.3)

## 2015-05-08 LAB — COMPREHENSIVE METABOLIC PANEL
ALBUMIN: 3.8 g/dL (ref 3.5–5.0)
ALK PHOS: 69 U/L (ref 40–150)
ALT: 11 U/L (ref 0–55)
AST: 15 U/L (ref 5–34)
Anion Gap: 8 mEq/L (ref 3–11)
BUN: 10.9 mg/dL (ref 7.0–26.0)
CO2: 28 meq/L (ref 22–29)
Calcium: 8.8 mg/dL (ref 8.4–10.4)
Chloride: 107 mEq/L (ref 98–109)
Creatinine: 0.8 mg/dL (ref 0.6–1.1)
EGFR: 75 mL/min/{1.73_m2} — ABNORMAL LOW (ref >90)
GLUCOSE: 95 mg/dL (ref 70–140)
POTASSIUM: 4.4 meq/L (ref 3.5–5.1)
SODIUM: 143 meq/L (ref 136–145)
TOTAL PROTEIN: 7.2 g/dL (ref 6.4–8.3)
Total Bilirubin: 0.58 mg/dL (ref 0.20–1.20)

## 2015-05-08 MED ORDER — METOPROLOL SUCCINATE ER 100 MG PO TB24: 100.0000 mg | ORAL_TABLET | Freq: Every day | ORAL | Status: DC

## 2015-05-08 MED ORDER — ZOLPIDEM TARTRATE ER 12.5 MG PO TBCR: 12.5000 mg | EXTENDED_RELEASE_TABLET | Freq: Every evening | ORAL | Status: DC | PRN

## 2015-05-08 NOTE — Progress Notes (Signed)
Patient Care Team: Arvella Nigh, MD as PCP - General (Obstetrics and Gynecology)  DIAGNOSIS: Breast cancer of upper-inner quadrant of left female breast [facility])   Staging form: Breast, AJCC 7th Edition     Clinical stage from 12/16/2011: Stage IIA (T2, N0, cM0) - Unsigned     Pathologic: No stage assigned - Unsigned  SUMMARY OF ONCOLOGIC HISTORY:   Breast cancer of upper-inner quadrant of left female breast ([place])   12/31/2011 - 03/11/2012 Neo-Adjuvant Chemotherapy Taxotere Herceptin Perjeta 4 followed by Herceptin maintenance completed 01/29/2013   04/06/2012 Surgery Left breast mastectomy: IDC grade 1; 0.8 cm, low-grade DCIS, LV I identified, 1/2 lymph node Micro met, ER/PR positive HER-2 positive Ki-67 94%   05/04/2012 - 06/20/2012 Radiation Therapy Adjuvant radiation therapy   07/29/2012 -  Anti-estrogen oral therapy Arimidex 1 mg daily   10/17/2014 Procedure Bone marrow biopsy for thrombocytopenia and leukopenia: Hypercellular marrow with trilineage dysplasia most apparent in megakaryocytes with associated myelofibrosis: low-grade MDS with refractory cytopenias and multilineage dysplasia (20q- partial del)    MDS (myelodysplastic syndrome), low grade ([place])   10/17/2014 Initial Diagnosis MDS (myelodysplastic syndrome), low grade , refractory cytopenia with multi nature dysplasia cytogenetics -20q (associated with good prognosis MDS)   CHIEF COMPLIANT: Follow-up of MDS and left breast cancer  INTERVAL HISTORY: Marie Anderson is a 70 year old with above-mentioned history left breast cancer who underwent left mastectomy followed by adjuvant radiation and has been on oral antiestrogen therapy with Arimidex. She was diagnosed with therapy-related MDS in August 2016 when she presented with thrombocytopenia and leukopenia. She is currently on surveillance and observation. She appears to have no new symptoms or concerns. She went to [facility] for second opinion regarding MDS. They recommended  continued observation and monitoring. She has a known diagnosis of Sjogren's syndrome.  REVIEW OF SYSTEMS:   Constitutional: Denies fevers, chills or abnormal weight loss, complains of difficulty with sleeping Eyes: Denies blurriness of vision Ears, nose, mouth, throat, and face: Dry eyes and dry mouth Respiratory: Denies cough, dyspnea or wheezes Cardiovascular: Denies palpitation, chest discomfort Gastrointestinal:  Denies nausea, heartburn or change in bowel habits Skin: Denies abnormal skin rashes Lymphatics: Denies new lymphadenopathy or easy bruising Neurological:Denies numbness, tingling or new weaknesses Behavioral/Psych: Mood is stable, no new changes  Extremities: No lower extremity edema Breast: denies any pain or lumps or nodules in either breasts All other systems were reviewed with the patient and are negative.  I have reviewed the past medical history, past surgical history, social history and family history with the patient and they are unchanged from previous note.  ALLERGIES:  is allergic to cephalosporins; codeine; and tape.  MEDICATIONS:  Current Outpatient Prescriptions  Medication Sig Dispense Refill  . anastrozole (ARIMIDEX) 1 MG tablet Take 1 tablet (1 mg total) by mouth daily. (Patient taking differently: Take 1 mg by mouth at bedtime. ) 90 tablet 4  . Calcium Carbonate-Vit D-Min (CALCIUM 1200 PO) Take 1 tablet by mouth every morning.    . cholecalciferol (VITAMIN D) 1000 UNITS tablet Take by mouth daily.     . cyanocobalamin 100 MCG tablet Take 100 mcg by mouth daily.    . fluocinonide-emollient (LIDEX-E) 0.05 % cream Apply 1 application topically daily as needed (welps and rash.).     . HYDROcodone-acetaminophen (NORCO/VICODIN) 5-325 MG per tablet Take 1 tablet by mouth as needed. (Patient taking differently: Take 1 tablet by mouth at bedtime as needed for moderate pain. ) 30 tablet 0  . metoprolol succinate (  TOPROL-XL) 100 MG 24 hr tablet Take 1 tablet (100  mg total) by mouth daily. Take with or immediately following a meal. (Patient taking differently: Take 100 mg by mouth at bedtime. Take with or immediately following a meal.) 90 tablet 3  . non-metallic deodorant (ALRA) MISC Apply 1 application topically daily as needed.    . Pseudoephedrine-APAP-DM (DAYQUIL MULTI-SYMPTOM PO) Take 1 tablet by mouth daily as needed (sinus.).    Marland Kitchen vitamin C (ASCORBIC ACID) 500 MG tablet Take 500 mg by mouth every morning.     . vitamin E 200 UNIT capsule Take 200 Units by mouth every morning.      No current facility-administered medications for this visit.    PHYSICAL EXAMINATION: ECOG PERFORMANCE STATUS: 1 - Symptomatic but completely ambulatory  Filed Vitals:   05/08/15 1018  BP: 157/78  Pulse: 76  Temp: 98.2 F (36.8 C)  Resp: 18   Filed Weights   05/08/15 1018  Weight: 210 lb 4.8 oz (95.391 kg)    GENERAL:alert, no distress and comfortable SKIN: skin color, texture, turgor are normal, no rashes or significant lesions EYES: normal, Conjunctiva are pink and non-injected, sclera clear OROPHARYNX:no exudate, no erythema and lips, buccal mucosa, and tongue normal  NECK: supple, thyroid normal size, non-tender, without nodularity LYMPH:  no palpable lymphadenopathy in the cervical, axillary or inguinal LUNGS: clear to auscultation and percussion with normal breathing effort HEART: regular rate & rhythm and no murmurs and no lower extremity edema ABDOMEN:abdomen soft, non-tender and normal bowel sounds MUSCULOSKELETAL:no cyanosis of digits and no clubbing  NEURO: alert & oriented x 3 with fluent speech, no focal motor/sensory deficits EXTREMITIES: No lower extremity edema  LABORATORY DATA:  I have reviewed the data as listed   Chemistry      Component Value Date/Time   NA 143 10/10/2014 1022   NA 143 03/31/2012 1200   K 3.9 10/10/2014 1022   K 3.7 03/31/2012 1200   CL 109* 08/19/2012 1021   CL 107 03/31/2012 1200   CO2 30* 10/10/2014 1022    CO2 29 03/31/2012 1200   BUN 11.0 10/10/2014 1022   BUN 7 03/31/2012 1200   CREATININE 0.8 10/10/2014 1022   CREATININE 0.67 03/31/2012 1200      Component Value Date/Time   CALCIUM 9.2 10/10/2014 1022   CALCIUM 8.6 03/31/2012 1200   ALKPHOS 73 10/10/2014 1022   AST 18 10/10/2014 1022   ALT 17 10/10/2014 1022   BILITOT 0.72 10/10/2014 1022       Lab Results  Component Value Date   WBC 2.9* 05/08/2015   HGB 13.6 05/08/2015   HCT 41.0 05/08/2015   MCV 99.3 05/08/2015   PLT 61* 05/08/2015   NEUTROABS 1.8 05/08/2015   ASSESSMENT & PLAN:  Breast cancer of upper-inner quadrant of left female breast Left breast invasive ductal carcinoma ER/PR positive HER-2 negative Ki-67 94% diagnosed October 2013 status post 4 cycles of TCH Perjeta followed by left mastectomy which revealed a 0.8 cm grade 1 IDC, 1 sentinel node micrometastatic disease ER/PR 100% positive status post radiation therapy and completed adjuvant trastuzumab 01/12/2013 currently on Arimidex 1 mg daily from 07/29/2012  Arimidex toxicities: 1. Occasional hot flashes 2. Muscle stiffness in the morning 3. Patient gets bone densities every other year and they have been normal according to her  Breast cancer surveillance: right breast mammograms 02/15/2015: Early dystrophic calcifications 1.3 cm: Stereotactic biopsy on 03/14/2015: Fat necrosis with calcifications  MDS (myelodysplastic syndrome), low grade Thrombocytopenia and  leukopenia: Bone marrow biopsy 10/17/2014 suggestive of low-grade MDS refractory cytopenia with multilineage dysplasia cytogenetics revealed 20 q- suggestive of good prognosis MDS, also associated with myelofibrosis, no increase in blasts, flow cytometry normal  Today's blood work was reviewed which showed white count of 2.9 with an ANC of 1.8, hemoglobin 13.6, platelets 6 1. Overall the numbers appear to be stable over the past 3 months. Patient had a second opinion consultation for the MDS at [facility].  Patient wishes to get additional labs for lupus Anticoagulant to be drawn today (as requested by her rheumatologist Dr.Devaswar).  Return to clinic in 3 months for follow-up. No orders of the defined types were placed in this encounter.   The patient has a good understanding of the overall plan. she agrees with it. she will call with any problems that may develop before the next visit here.   Rulon Eisenmenger, MD 05/08/2015

## 2015-05-08 NOTE — Telephone Encounter (Signed)
appt made and avs printed °

## 2015-05-16 DIAGNOSIS — G603 Idiopathic progressive neuropathy: Secondary | ICD-10-CM | POA: Diagnosis not present

## 2015-05-16 DIAGNOSIS — G5601 Carpal tunnel syndrome, right upper limb: Secondary | ICD-10-CM | POA: Diagnosis not present

## 2015-05-16 DIAGNOSIS — M5417 Radiculopathy, lumbosacral region: Secondary | ICD-10-CM | POA: Diagnosis not present

## 2015-05-16 DIAGNOSIS — G5602 Carpal tunnel syndrome, left upper limb: Secondary | ICD-10-CM | POA: Diagnosis not present

## 2015-05-20 ENCOUNTER — Telehealth: Payer: Self-pay | Admitting: *Deleted

## 2015-05-20 NOTE — Telephone Encounter (Signed)
FYI "My lab results aren't on MyChart.  I need a copy of the order and results of the Anti-phospholipd lab test My Rheumatologist Dr. Bo Merino ordered May 08, 2015.  I may have antiphospholipid syndrome.  Dr. Geralyn Flash following me for MDS.  I can bleed easily with low platelets and risk of blood clots with this syndrome." No copy of labs this lab order in this EHR at this time.  Marie Anderson will call Rheumatologist for results but expressed "surprise that this office doesn't have copy of either for our records".

## 2015-05-28 DIAGNOSIS — S0501XA Injury of conjunctiva and corneal abrasion without foreign body, right eye, initial encounter: Secondary | ICD-10-CM | POA: Diagnosis not present

## 2015-05-31 DIAGNOSIS — C50912 Malignant neoplasm of unspecified site of left female breast: Secondary | ICD-10-CM | POA: Diagnosis not present

## 2015-08-08 ENCOUNTER — Ambulatory Visit (INDEPENDENT_AMBULATORY_CARE_PROVIDER_SITE_OTHER): Payer: Medicare Other | Admitting: Cardiology

## 2015-08-08 ENCOUNTER — Encounter: Payer: Self-pay | Admitting: Cardiology

## 2015-08-08 VITALS — BP 142/90 | HR 74 | Ht 65.0 in | Wt 202.0 lb

## 2015-08-08 DIAGNOSIS — I471 Supraventricular tachycardia: Secondary | ICD-10-CM

## 2015-08-08 MED ORDER — METOPROLOL SUCCINATE ER 100 MG PO TB24: 100.0000 mg | ORAL_TABLET | Freq: Every day | ORAL | Status: DC

## 2015-08-08 NOTE — Patient Instructions (Signed)
Continue your current therapy  I will see you in one year   

## 2015-08-08 NOTE — Progress Notes (Signed)
Marie Anderson Date of Birth: 1945/04/04 Medical Record [id number]  History of Present Illness: Marie Anderson is seen for  followup SVT. She has a history of supraventricular tachycardia that has been well controlled with metoprolol. She also has a history of breast cancer. She had a left mastectomy with reconstruction. She was treated with Herceptin. Serial Echos have been normal. Her last episode of SVT requiring Adenosine was in November 2013. In July 2015 she had an episode that responded to Valsalva maneuver.  Since her last visit she has been diagnosed with myelodysplasia syndrome related to prior chemo. This has been felt to be low grade. plt count 66K. She also has been diagnosed with Sjogren's syndrome and antiphospholipid syndrome. Not felt to be a candidate for anticoagulation due to thrombocytopenia. She does complain of neuropathy affecting her face. She did note increased palpitations on pilocarpine. Now only taking this once a day without palpitations.   Current Outpatient Prescriptions on File Prior to Visit  Medication Sig Dispense Refill  . anastrozole (ARIMIDEX) 1 MG tablet Take 1 tablet (1 mg total) by mouth daily. (Patient taking differently: Take 1 mg by mouth at bedtime. ) 90 tablet 4  . Calcium Carbonate-Vit D-Min (CALCIUM 1200 PO) Take 1 tablet by mouth every morning.    . cholecalciferol (VITAMIN D) 1000 UNITS tablet Take by mouth daily.     . cyanocobalamin 100 MCG tablet Take 100 mcg by mouth daily.    . fluocinonide-emollient (LIDEX-E) 0.05 % cream Apply 1 application topically daily as needed (welps and rash.).     . HYDROcodone-acetaminophen (NORCO/VICODIN) 5-325 MG per tablet Take 1 tablet by mouth as needed. (Patient taking differently: Take 1 tablet by mouth at bedtime as needed for moderate pain. ) 30 tablet 0  . non-metallic deodorant (ALRA) MISC Apply 1 application topically daily as needed.    . Pseudoephedrine-APAP-DM (DAYQUIL MULTI-SYMPTOM PO) Take 1 tablet by  mouth daily as needed (sinus.).    Marland Kitchen vitamin C (ASCORBIC ACID) 500 MG tablet Take 500 mg by mouth every morning.     . vitamin E 200 UNIT capsule Take 200 Units by mouth every morning.     . zolpidem (AMBIEN CR) 12.5 MG CR tablet Take 1 tablet (12.5 mg total) by mouth at bedtime as needed for sleep. 30 tablet 3   No current facility-administered medications on file prior to visit.    Allergies  Allergen Reactions  . Cephalosporins     headache  . Codeine Nausea Only  . Tape Other (See Comments)    Sensitivity to bandaids and tape that cause blisters and redness    Past Medical History  Diagnosis Date  . Anemia   . SVT (supraventricular tachycardia) ([place])   . Breast cancer ([place]) 12/04/11    left- inv ductal ca, DCIS, ER/PR +, HER 2 +  . History of chemotherapy 12/31/11 -03/11/12    s/p 4 cycles  . History of radiation therapy 05/04/12-06/20/12    left breast/  . PONV (postoperative nausea and vomiting)   . Myelodysplasia   . Neuropathy ([place])   . Antiphospholipid antibody syndrome [facility])     Past Surgical History  Procedure Laterality Date  . Dilation and curettage of uterus    . Eye surgery      age 4-lt eye growth  . Colonoscopy    . Portacath placement  12/23/2011    Procedure: INSERTION PORT-A-CATH;  Surgeon: Rolm Bookbinder, MD;  Location: [place];  Service: General;  Laterality: Right;  . Mastectomy w/ sentinel node biopsy  04/06/2012    Procedure: MASTECTOMY WITH SENTINEL LYMPH NODE BIOPSY;  Surgeon: Rolm Bookbinder, MD;  Location: [place];  Service: General;  Laterality: Left;  left mastectomy, left axillary sentinel node biopsy  . Breast reconstruction with placement of tissue expander and flex hd (acellular hydrated dermis)  04/06/2012    Procedure: BREAST RECONSTRUCTION WITH PLACEMENT OF TISSUE EXPANDER AND FLEX HD (ACELLULAR HYDRATED DERMIS);  Surgeon: Theodoro Kos, DO;  Location: [place];  Service: Plastics;   Laterality: Left;  IMMEDIATE LEFT BREAST RECONSTRUCTION WITH PLACEMENT OF TISSUE EXPANDER AND ALLODERM  . Removal of tissue expander and placement of implant Left 01/19/2013    Procedure: REMOVAL OF LEFT TISSUE EXPANDERS WITH PLACEMENT OF LEFT BREAST IMPLANT;  Surgeon: Theodoro Kos, DO;  Location: [place];  Service: Plastics;  Laterality: Left;  Marland Kitchen Mastopexy Right 01/19/2013    Procedure: RIGHT BREAST PLACEMENT OF IMPLANT WITH MASTOPEXY;  Surgeon: Theodoro Kos, DO;  Location: [place];  Service: Plastics;  Laterality: Right;  . Liposuction with lipofilling N/A 01/19/2013    Procedure: LIPOSUCTION WITH LIPOFILLING;  Surgeon: Theodoro Kos, DO;  Location: [place];  Service: Plastics;  Laterality: N/A;  . Port-a-cath removal Right 01/19/2013    Procedure: REMOVAL PORT-A-CATH;  Surgeon: Rolm Bookbinder, MD;  Location: [place];  Service: General;  Laterality: Right;    History  Smoking status  . Never Smoker   Smokeless tobacco  . Never Used    History  Alcohol Use  . Yes    Family History  Problem Relation Age of Onset  . Stroke Mother   . Cancer Father     prostate    Review of Systems: As noted in history of present illness.  All other systems were reviewed and are negative.  Physical Exam: BP 142/90 mmHg  Pulse 74  Ht 5\' 5"  (1.651 m)  Wt 202 lb (91.627 kg)  BMI 33.61 kg/m2 WDWF in NAD. The HEENT exam is normal.  The carotids are 2+ without bruits.  There is no thyromegaly.  There is no JVD.  The lungs are clear.  She has had left breast reconstruction. The heart exam reveals a regular rate with a normal S1 and S2.  There are no murmurs, gallops, or rubs.  The PMI is not displaced.   Abdominal exam reveals good bowel sounds.  There is no hepatosplenomegaly or tenderness.  There are no masses.  Exam of the legs reveal no clubbing, cyanosis, or edema.  The legs are without rashes.  The distal pulses are  intact.  Cranial nerves II - XII are intact.  Motor and sensory functions are intact.  The gait is normal.  LABORATORY DATA: Ecg today: NSR, nonspecific ST abnormality. I have personally reviewed and interpreted this study.   Assessment / Plan: 1. Supraventricular tachycardia. Well controlled on metoprolol. We will continue on her current medication for now. Continue to avoid stimulants. Follow up in one year.  2. Breast cancer status post mastectomy. s/p chemotherapy with Herceptin. Echocardiogram showed normal LV function.   3. Myelodysplasia syndrome. Low grade with thrombocytopenia. Followed by oncology.  4. Sjogren's syndrome  5. Antiphospholipid syndrome. Follow by Rheumatology.

## 2015-08-14 ENCOUNTER — Telehealth: Payer: Self-pay | Admitting: Hematology and Oncology

## 2015-08-14 ENCOUNTER — Other Ambulatory Visit (HOSPITAL_BASED_OUTPATIENT_CLINIC_OR_DEPARTMENT_OTHER): Payer: Medicare Other

## 2015-08-14 ENCOUNTER — Ambulatory Visit (HOSPITAL_BASED_OUTPATIENT_CLINIC_OR_DEPARTMENT_OTHER): Payer: Medicare Other | Admitting: Hematology and Oncology

## 2015-08-14 ENCOUNTER — Encounter: Payer: Self-pay | Admitting: Hematology and Oncology

## 2015-08-14 VITALS — BP 139/85 | HR 87 | Temp 98.9°F | Resp 18 | Ht 65.0 in | Wt 200.4 lb

## 2015-08-14 DIAGNOSIS — Z170 Estrogen receptor positive status [ER+]: Secondary | ICD-10-CM | POA: Diagnosis not present

## 2015-08-14 DIAGNOSIS — D4620 Refractory anemia with excess of blasts, unspecified: Secondary | ICD-10-CM

## 2015-08-14 DIAGNOSIS — C50212 Malignant neoplasm of upper-inner quadrant of left female breast: Secondary | ICD-10-CM

## 2015-08-14 DIAGNOSIS — D696 Thrombocytopenia, unspecified: Secondary | ICD-10-CM | POA: Diagnosis not present

## 2015-08-14 DIAGNOSIS — D72819 Decreased white blood cell count, unspecified: Secondary | ICD-10-CM | POA: Diagnosis not present

## 2015-08-14 LAB — COMPREHENSIVE METABOLIC PANEL
ALT: 14 U/L (ref 0–55)
AST: 19 U/L (ref 5–34)
Albumin: 4.2 g/dL (ref 3.5–5.0)
Alkaline Phosphatase: 61 U/L (ref 40–150)
Anion Gap: 9 mEq/L (ref 3–11)
BUN: 9.8 mg/dL (ref 7.0–26.0)
CALCIUM: 9.3 mg/dL (ref 8.4–10.4)
CHLORIDE: 107 meq/L (ref 98–109)
CO2: 27 meq/L (ref 22–29)
CREATININE: 0.8 mg/dL (ref 0.6–1.1)
EGFR: 77 mL/min/{1.73_m2} — ABNORMAL LOW (ref >90)
Glucose: 81 mg/dl (ref 70–140)
Potassium: 3.7 mEq/L (ref 3.5–5.1)
Sodium: 143 mEq/L (ref 136–145)
Total Bilirubin: 0.81 mg/dL (ref 0.20–1.20)
Total Protein: 7.6 g/dL (ref 6.4–8.3)

## 2015-08-14 LAB — CBC WITH DIFFERENTIAL/PLATELET
BASO%: 0.4 % (ref 0.0–2.0)
Basophils Absolute: 0 10*3/uL (ref 0.0–0.1)
EOS%: 0.8 % (ref 0.0–7.0)
Eosinophils Absolute: 0 10*3/uL (ref 0.0–0.5)
HCT: 42.2 % (ref 34.8–46.6)
HGB: 14 g/dL (ref 11.6–15.9)
LYMPH%: 30.4 % (ref 14.0–49.7)
MCH: 32.8 pg (ref 25.1–34.0)
MCHC: 33.2 g/dL (ref 31.5–36.0)
MCV: 98.8 fL (ref 79.5–101.0)
MONO#: 0.3 10*3/uL (ref 0.1–0.9)
MONO%: 10.1 % (ref 0.0–14.0)
NEUT%: 58.3 % (ref 38.4–76.8)
NEUTROS ABS: 1.5 10*3/uL (ref 1.5–6.5)
Platelets: 57 10*3/uL — ABNORMAL LOW (ref 145–400)
RBC: 4.27 10*6/uL (ref 3.70–5.45)
RDW: 14 % (ref 11.2–14.5)
WBC: 2.6 10*3/uL — AB (ref 3.9–10.3)
lymph#: 0.8 10*3/uL — ABNORMAL LOW (ref 0.9–3.3)

## 2015-08-14 NOTE — Assessment & Plan Note (Signed)
Thrombocytopenia and leukopenia: Bone marrow biopsy 10/17/2014 suggestive of low-grade MDS refractory cytopenia with multilineage dysplasia cytogenetics revealed 20 q- suggestive of good prognosis MDS, also associated with myelofibrosis, no increase in blasts, flow cytometry normal  Today's blood work was reviewed which showed white count of 2.9 with an ANC of 1.8, hemoglobin 13.6, platelets 6 1. Overall the numbers appear to be stable over the past 3 months. Patient had a second opinion consultation for the MDS at [facility].  Return to clinic in 3 months for follow-up.

## 2015-08-14 NOTE — Progress Notes (Signed)
Patient Care Team: Arvella Nigh, MD as PCP - General (Obstetrics and Gynecology) Bo Merino, MD as Consulting Physician (Rheumatology)  DIAGNOSIS: Breast cancer of upper-inner quadrant of left female breast [facility])   Staging form: Breast, AJCC 7th Edition     Clinical stage from 12/16/2011: Stage IIA (T2, N0, cM0) - Unsigned     Pathologic: No stage assigned - Unsigned   SUMMARY OF ONCOLOGIC HISTORY:   Breast cancer of upper-inner quadrant of left female breast ([place])   12/31/2011 - 03/11/2012 Neo-Adjuvant Chemotherapy Taxotere Herceptin Perjeta 4 followed by Herceptin maintenance completed 01/29/2013   04/06/2012 Surgery Left breast mastectomy: IDC grade 1; 0.8 cm, low-grade DCIS, LV I identified, 1/2 lymph node Micro met, ER/PR positive HER-2 positive Ki-67 94%   05/04/2012 - 06/20/2012 Radiation Therapy Adjuvant radiation therapy   07/29/2012 -  Anti-estrogen oral therapy Arimidex 1 mg daily   10/17/2014 Procedure Bone marrow biopsy for thrombocytopenia and leukopenia: Hypercellular marrow with trilineage dysplasia most apparent in megakaryocytes with associated myelofibrosis: low-grade MDS with refractory cytopenias and multilineage dysplasia (20q- partial del)    MDS (myelodysplastic syndrome), low grade ([place])   10/17/2014 Initial Diagnosis MDS (myelodysplastic syndrome), low grade , refractory cytopenia with multi nature dysplasia cytogenetics -20q (associated with good prognosis MDS)    CHIEF COMPLIANT: Follow-up of MDS  INTERVAL HISTORY: Marie Anderson is a 70 year old with above-mentioned history of myelodysplastic syndrome therapy related with the low-grade refractory anemia with multilineage dysplasia. She had seen [employer] for a second opinion and they recommended continued observation. She had a hitch of a typing done. Patient has been doing quite well without any new problems or concerns. She is had her usual shadow. A respiratory infection but otherwise seemed to have  recovered from it. She was told that she has antiphospholipid antibodies circulating and blood. Although she has never had any blood clot history.  REVIEW OF SYSTEMS:   Constitutional: Denies fevers, chills or abnormal weight loss Eyes: Denies blurriness of vision Ears, nose, mouth, throat, and face: Denies mucositis or sore throat Respiratory: Denies cough, dyspnea or wheezes Cardiovascular: Denies palpitation, chest discomfort Gastrointestinal:  Denies nausea, heartburn or change in bowel habits Skin: Denies abnormal skin rashes Lymphatics: Denies new lymphadenopathy or easy bruising Neurological:Denies numbness, tingling or new weaknesses Behavioral/Psych: Mood is stable, no new changes  Extremities: No lower extremity edema  All other systems were reviewed with the patient and are negative.  I have reviewed the past medical history, past surgical history, social history and family history with the patient and they are unchanged from previous note.  ALLERGIES:  is allergic to cephalosporins; codeine; and tape.  MEDICATIONS:  Current Outpatient Prescriptions  Medication Sig Dispense Refill  . anastrozole (ARIMIDEX) 1 MG tablet Take 1 tablet (1 mg total) by mouth daily. (Patient taking differently: Take 1 mg by mouth at bedtime. ) 90 tablet 4  . Calcium Carbonate-Vit D-Min (CALCIUM 1200 PO) Take 1 tablet by mouth every morning.    . cholecalciferol (VITAMIN D) 1000 UNITS tablet Take by mouth daily.     . cyanocobalamin 100 MCG tablet Take 100 mcg by mouth daily.    . fluocinonide-emollient (LIDEX-E) 0.05 % cream Apply 1 application topically daily as needed (welps and rash.).     Marland Kitchen gabapentin (NEURONTIN) 600 MG tablet Take 600 mg by mouth 2 (two) times daily.    Marland Kitchen HYDROcodone-acetaminophen (NORCO/VICODIN) 5-325 MG per tablet Take 1 tablet by mouth as needed. (Patient taking differently: Take 1 tablet by  mouth at bedtime as needed for moderate pain. ) 30 tablet 0  . metoprolol  succinate (TOPROL-XL) 100 MG 24 hr tablet Take 1 tablet (100 mg total) by mouth daily. Take with or immediately following a meal. 90 tablet 3  . non-metallic deodorant (ALRA) MISC Apply 1 application topically daily as needed.    . pilocarpine (SALAGEN) 5 MG tablet Take 5 mg by mouth daily.    . Pseudoephedrine-APAP-DM (DAYQUIL MULTI-SYMPTOM PO) Take 1 tablet by mouth daily as needed (sinus.).    Marland Kitchen vitamin C (ASCORBIC ACID) 500 MG tablet Take 500 mg by mouth every morning.     . vitamin E 200 UNIT capsule Take 200 Units by mouth every morning.     . zolpidem (AMBIEN CR) 12.5 MG CR tablet Take 1 tablet (12.5 mg total) by mouth at bedtime as needed for sleep. 30 tablet 3   No current facility-administered medications for this visit.    PHYSICAL EXAMINATION: ECOG PERFORMANCE STATUS: 1 - Symptomatic but completely ambulatory  Filed Vitals:   08/14/15 1458  BP: 139/85  Pulse: 87  Temp: 98.9 F (37.2 C)  Resp: 18   Filed Weights   08/14/15 1458  Weight: 200 lb 6.4 oz (90.901 kg)    GENERAL:alert, no distress and comfortable SKIN: skin color, texture, turgor are normal, no rashes or significant lesions EYES: normal, Conjunctiva are pink and non-injected, sclera clear OROPHARYNX:no exudate, no erythema and lips, buccal mucosa, and tongue normal  NECK: supple, thyroid normal size, non-tender, without nodularity LYMPH:  no palpable lymphadenopathy in the cervical, axillary or inguinal LUNGS: clear to auscultation and percussion with normal breathing effort HEART: regular rate & rhythm and no murmurs and no lower extremity edema ABDOMEN:abdomen soft, non-tender and normal bowel sounds MUSCULOSKELETAL:no cyanosis of digits and no clubbing  NEURO: alert & oriented x 3 with fluent speech, no focal motor/sensory deficits EXTREMITIES: No lower extremity edema  LABORATORY DATA:  I have reviewed the data as listed   Chemistry      Component Value Date/Time   NA 143 05/08/2015 0959   NA  143 03/31/2012 1200   K 4.4 05/08/2015 0959   K 3.7 03/31/2012 1200   CL 109* 08/19/2012 1021   CL 107 03/31/2012 1200   CO2 28 05/08/2015 0959   CO2 29 03/31/2012 1200   BUN 10.9 05/08/2015 0959   BUN 7 03/31/2012 1200   CREATININE 0.8 05/08/2015 0959   CREATININE 0.67 03/31/2012 1200      Component Value Date/Time   CALCIUM 8.8 05/08/2015 0959   CALCIUM 8.6 03/31/2012 1200   ALKPHOS 69 05/08/2015 0959   AST 15 05/08/2015 0959   ALT 11 05/08/2015 0959   BILITOT 0.58 05/08/2015 0959       Lab Results  Component Value Date   WBC 2.6* 08/14/2015   HGB 14.0 08/14/2015   HCT 42.2 08/14/2015   MCV 98.8 08/14/2015   PLT 57* 08/14/2015   NEUTROABS 1.5 08/14/2015     ASSESSMENT & PLAN:  Breast cancer of upper-inner quadrant of left female breast Left breast invasive ductal carcinoma ER/PR positive HER-2 negative Ki-67 94% diagnosed October 2013 status post 4 cycles of TCH Perjeta followed by left mastectomy which revealed a 0.8 cm grade 1 IDC, 1 sentinel node micrometastatic disease ER/PR 100% positive status post radiation therapy and completed adjuvant trastuzumab 01/12/2013 currently on Arimidex 1 mg daily from 07/29/2012  Arimidex toxicities: 1. Occasional hot flashes 2. Muscle stiffness in the morning 3. Patient gets  bone densities every other year and they have been normal according to her  Breast cancer surveillance: right breast mammograms 02/15/2015: Early dystrophic calcifications 1.3 cm: Stereotactic biopsy on 03/14/2015: Fat necrosis with calcifications  MDS (myelodysplastic syndrome), low grade Thrombocytopenia and leukopenia: Bone marrow biopsy 10/17/2014 suggestive of low-grade MDS refractory cytopenia with multilineage dysplasia cytogenetics revealed 20 q- suggestive of good prognosis MDS, also associated with myelofibrosis, no increase in blasts, flow cytometry normal  Today's blood work was reviewed which showed white count of 2.6 with an ANC of 1.5,  hemoglobin 14, platelets 57 (were 61). Overall the numbers appear to be stable over the past 3 months. Patient had a second opinion consultation for the MDS at [facility]. They agreed with the current treatment plan of surveillance and observation. If her blood count significantly get worse she will need another bone marrow biopsy.  Return to clinic in 3 months for follow-up.   No orders of the defined types were placed in this encounter.   The patient has a good understanding of the overall plan. she agrees with it. she will call with any problems that may develop before the next visit here.   Rulon Eisenmenger, MD 08/14/2015

## 2015-08-14 NOTE — Telephone Encounter (Signed)
appt made and avs printed °

## 2015-08-14 NOTE — Assessment & Plan Note (Signed)
Left breast invasive ductal carcinoma ER/PR positive HER-2 negative Ki-67 94% diagnosed October 2013 status post 4 cycles of [place] followed by left mastectomy which revealed a 0.8 cm grade 1 IDC, 1 sentinel node micrometastatic disease ER/PR 100% positive status post radiation therapy and completed adjuvant trastuzumab 01/12/2013 currently on Arimidex 1 mg daily from 07/29/2012  Arimidex toxicities: 1. Occasional hot flashes 2. Muscle stiffness in the morning 3. Patient gets bone densities every other year and they have been normal according to her  Breast cancer surveillance: right breast mammograms 02/15/2015: Early dystrophic calcifications 1.3 cm: Stereotactic biopsy on 03/14/2015: Fat necrosis with calcifications

## 2015-08-22 DIAGNOSIS — G2581 Restless legs syndrome: Secondary | ICD-10-CM | POA: Diagnosis not present

## 2015-08-22 DIAGNOSIS — M5417 Radiculopathy, lumbosacral region: Secondary | ICD-10-CM | POA: Diagnosis not present

## 2015-08-22 DIAGNOSIS — R202 Paresthesia of skin: Secondary | ICD-10-CM | POA: Diagnosis not present

## 2015-08-22 DIAGNOSIS — R201 Hypoesthesia of skin: Secondary | ICD-10-CM | POA: Diagnosis not present

## 2015-10-01 ENCOUNTER — Other Ambulatory Visit: Payer: Self-pay | Admitting: Hematology and Oncology

## 2015-10-01 DIAGNOSIS — C50219 Malignant neoplasm of upper-inner quadrant of unspecified female breast: Secondary | ICD-10-CM

## 2015-10-01 NOTE — Telephone Encounter (Signed)
Chart reviewed.

## 2015-10-31 DIAGNOSIS — R768 Other specified abnormal immunological findings in serum: Secondary | ICD-10-CM | POA: Diagnosis not present

## 2015-10-31 DIAGNOSIS — M3509 Sicca syndrome with other organ involvement: Secondary | ICD-10-CM | POA: Diagnosis not present

## 2015-10-31 DIAGNOSIS — R200 Anesthesia of skin: Secondary | ICD-10-CM | POA: Diagnosis not present

## 2015-10-31 DIAGNOSIS — M359 Systemic involvement of connective tissue, unspecified: Secondary | ICD-10-CM | POA: Diagnosis not present

## 2015-11-07 ENCOUNTER — Other Ambulatory Visit: Payer: Self-pay

## 2015-11-13 DIAGNOSIS — G2581 Restless legs syndrome: Secondary | ICD-10-CM | POA: Diagnosis not present

## 2015-11-13 DIAGNOSIS — G5603 Carpal tunnel syndrome, bilateral upper limbs: Secondary | ICD-10-CM | POA: Diagnosis not present

## 2015-11-13 DIAGNOSIS — G509 Disorder of trigeminal nerve, unspecified: Secondary | ICD-10-CM | POA: Diagnosis not present

## 2015-11-13 DIAGNOSIS — R202 Paresthesia of skin: Secondary | ICD-10-CM | POA: Diagnosis not present

## 2015-11-13 DIAGNOSIS — M3500 Sicca syndrome, unspecified: Secondary | ICD-10-CM | POA: Diagnosis not present

## 2015-11-13 DIAGNOSIS — M5417 Radiculopathy, lumbosacral region: Secondary | ICD-10-CM | POA: Diagnosis not present

## 2015-11-13 DIAGNOSIS — G603 Idiopathic progressive neuropathy: Secondary | ICD-10-CM | POA: Diagnosis not present

## 2015-11-16 ENCOUNTER — Other Ambulatory Visit: Payer: Self-pay | Admitting: Hematology and Oncology

## 2015-11-16 DIAGNOSIS — G4700 Insomnia, unspecified: Secondary | ICD-10-CM

## 2015-11-20 ENCOUNTER — Telehealth: Payer: Self-pay | Admitting: *Deleted

## 2015-11-20 DIAGNOSIS — G4700 Insomnia, unspecified: Secondary | ICD-10-CM

## 2015-11-20 DIAGNOSIS — D4620 Refractory anemia with excess of blasts, unspecified: Secondary | ICD-10-CM

## 2015-11-20 MED ORDER — ZOLPIDEM TARTRATE ER 12.5 MG PO TBCR: 12.5000 mg | EXTENDED_RELEASE_TABLET | Freq: Every evening | ORAL | 0 refills | Status: DC | PRN

## 2015-11-20 NOTE — Telephone Encounter (Signed)
Kelly with [facility] calling about a refill for Zolpidem CR 12.5 mg for this patient."  Refill for one thirty day supply authorized with this call.

## 2015-11-27 ENCOUNTER — Encounter: Payer: Self-pay | Admitting: Hematology and Oncology

## 2015-11-27 ENCOUNTER — Ambulatory Visit (HOSPITAL_BASED_OUTPATIENT_CLINIC_OR_DEPARTMENT_OTHER): Payer: Medicare Other | Admitting: Hematology and Oncology

## 2015-11-27 ENCOUNTER — Other Ambulatory Visit (HOSPITAL_BASED_OUTPATIENT_CLINIC_OR_DEPARTMENT_OTHER): Payer: Medicare Other

## 2015-11-27 DIAGNOSIS — D4620 Refractory anemia with excess of blasts, unspecified: Secondary | ICD-10-CM

## 2015-11-27 DIAGNOSIS — C50212 Malignant neoplasm of upper-inner quadrant of left female breast: Secondary | ICD-10-CM | POA: Diagnosis not present

## 2015-11-27 DIAGNOSIS — D72819 Decreased white blood cell count, unspecified: Secondary | ICD-10-CM

## 2015-11-27 DIAGNOSIS — Z170 Estrogen receptor positive status [ER+]: Secondary | ICD-10-CM | POA: Diagnosis not present

## 2015-11-27 DIAGNOSIS — Z79811 Long term (current) use of aromatase inhibitors: Secondary | ICD-10-CM | POA: Diagnosis not present

## 2015-11-27 DIAGNOSIS — D696 Thrombocytopenia, unspecified: Secondary | ICD-10-CM

## 2015-11-27 LAB — CBC WITH DIFFERENTIAL/PLATELET
BASO%: 0.3 % (ref 0.0–2.0)
BASOS ABS: 0 10*3/uL (ref 0.0–0.1)
EOS%: 1 % (ref 0.0–7.0)
Eosinophils Absolute: 0 10*3/uL (ref 0.0–0.5)
HEMATOCRIT: 41.6 % (ref 34.8–46.6)
HGB: 14.1 g/dL (ref 11.6–15.9)
LYMPH%: 29.3 % (ref 14.0–49.7)
MCH: 33.7 pg (ref 25.1–34.0)
MCHC: 33.9 g/dL (ref 31.5–36.0)
MCV: 99.3 fL (ref 79.5–101.0)
MONO#: 0.3 10*3/uL (ref 0.1–0.9)
MONO%: 9 % (ref 0.0–14.0)
NEUT#: 1.8 10*3/uL (ref 1.5–6.5)
NEUT%: 60.4 % (ref 38.4–76.8)
Platelets: 60 10*3/uL — ABNORMAL LOW (ref 145–400)
RBC: 4.19 10*6/uL (ref 3.70–5.45)
RDW: 14.6 % — ABNORMAL HIGH (ref 11.2–14.5)
WBC: 2.9 10*3/uL — ABNORMAL LOW (ref 3.9–10.3)
lymph#: 0.9 10*3/uL (ref 0.9–3.3)

## 2015-11-27 NOTE — Assessment & Plan Note (Signed)
Left breast invasive ductal carcinoma ER/PR positive HER-2 negative Ki-67 94% diagnosed October 2013 status post 4 cycles of [place] followed by left mastectomy which revealed a 0.8 cm grade 1 IDC, 1 sentinel node micrometastatic disease ER/PR 100% positive status post radiation therapy and completed adjuvant trastuzumab 01/12/2013 currently on Arimidex 1 mg daily from 07/29/2012  Arimidex toxicities: 1. Occasional hot flashes 2. Muscle stiffness in the morning 3. Patient gets bone densities every other year and they have been normal according to her  Breast cancer surveillance: right breast mammograms 02/15/2015: Early dystrophic calcifications 1.3 cm: Stereotactic biopsy on 03/14/2015: Fat necrosis with calcifications

## 2015-11-27 NOTE — Assessment & Plan Note (Signed)
Thrombocytopenia and leukopenia: Bone marrow biopsy 10/17/2014 suggestive of low-grade MDS refractory cytopenia with multilineage dysplasia cytogenetics revealed 20 q- suggestive of good prognosis MDS, also associated with myelofibrosis, no increase in blasts, flow cytometry normal  Today's blood work was reviewed which showed white count of 2.6 with an ANC of 1.5, hemoglobin 14, platelets 57 (were 61). Overall the numbers appear to be stable over the past 3 months. Patient had a second opinion consultation for the MDS at [facility]. They agreed with the current treatment plan of surveillance and observation. If her blood count significantly get worse she will need another bone marrow biopsy.  Return to clinic in 3 months for follow-up.

## 2015-11-27 NOTE — Progress Notes (Signed)
Patient Care Team: Arvella Nigh, MD as PCP - General (Obstetrics and Gynecology) Bo Merino, MD as Consulting Physician (Rheumatology)  DIAGNOSIS: Breast cancer of upper-inner quadrant of left female breast [facility])   Staging form: Breast, AJCC 7th Edition   - Clinical stage from 12/16/2011: Stage IIA (T2, N0, cM0) - Unsigned   - Pathologic: No stage assigned - Unsigned  SUMMARY OF ONCOLOGIC HISTORY:   Breast cancer of upper-inner quadrant of left female breast ([place])   12/31/2011 - 03/11/2012 Neo-Adjuvant Chemotherapy    Taxotere Herceptin Perjeta 4 followed by Herceptin maintenance completed 01/29/2013      04/06/2012 Surgery    Left breast mastectomy: IDC grade 1; 0.8 cm, low-grade DCIS, LV I identified, 1/2 lymph node Micro met, ER/PR positive HER-2 positive Ki-67 94%      05/04/2012 - 06/20/2012 Radiation Therapy    Adjuvant radiation therapy      07/29/2012 -  Anti-estrogen oral therapy    Arimidex 1 mg daily      10/17/2014 Procedure    Bone marrow biopsy for thrombocytopenia and leukopenia: Hypercellular marrow with trilineage dysplasia most apparent in megakaryocytes with associated myelofibrosis: low-grade MDS with refractory cytopenias and multilineage dysplasia (20q- partial del)       MDS (myelodysplastic syndrome), low grade ([place])   10/17/2014 Initial Diagnosis    MDS (myelodysplastic syndrome), low grade , refractory cytopenia with multi nature dysplasia cytogenetics -20q (associated with good prognosis MDS)       CHIEF COMPLIANT: Follow-up of MDS in breast cancer  INTERVAL HISTORY: SAE HANDRICH is a 70 year old with above-mentioned history of left breast cancer and recently diagnosed myelodysplastic syndrome currently on observation. She is here for a three-month blood count check. She reports that recently she had upper respiratory infection is currently on antibiotic with Augmentin. She is also diagnosed with a varicella-zoster antibody in the bloodstream  and is currently taking Valtrex. It appears that the Valtrex is causing her some GI upset. She denies any lumps or nodules in the breasts. Her mammograms are being scheduled for January.  REVIEW OF SYSTEMS:   Constitutional: Denies fevers, chills or abnormal weight loss Eyes: Denies blurriness of vision Ears, nose, mouth, throat, and face: Upper respiratory infection Respiratory: Denies cough, dyspnea or wheezes Cardiovascular: Denies palpitation, chest discomfort Gastrointestinal:  Denies nausea, heartburn or change in bowel habits Skin: Denies abnormal skin rashes Lymphatics: Denies new lymphadenopathy or easy bruising Neurological:Denies numbness, tingling or new weaknesses Behavioral/Psych: Mood is stable, no new changes  Extremities: No lower extremity edema Breast:  denies any pain or lumps or nodules in either breasts All other systems were reviewed with the patient and are negative.  I have reviewed the past medical history, past surgical history, social history and family history with the patient and they are unchanged from previous note.  ALLERGIES:  is allergic to cephalosporins; codeine; and tape.  MEDICATIONS:  Current Outpatient Prescriptions  Medication Sig Dispense Refill  . anastrozole (ARIMIDEX) 1 MG tablet TAKE 1 TABLET (1 MG TOTAL) BY MOUTH DAILY. 90 tablet 3  . Calcium Carbonate-Vit D-Min (CALCIUM 1200 PO) Take 1 tablet by mouth every morning.    . cholecalciferol (VITAMIN D) 1000 UNITS tablet Take by mouth daily.     . cyanocobalamin 100 MCG tablet Take 100 mcg by mouth daily.    . fluocinonide-emollient (LIDEX-E) 0.05 % cream Apply 1 application topically daily as needed (welps and rash.).     Marland Kitchen gabapentin (NEURONTIN) 600 MG tablet Take 600 mg by  mouth 2 (two) times daily.    Marland Kitchen HYDROcodone-acetaminophen (NORCO/VICODIN) 5-325 MG per tablet Take 1 tablet by mouth as needed. (Patient taking differently: Take 1 tablet by mouth at bedtime as needed for moderate pain. )  30 tablet 0  . metoprolol succinate (TOPROL-XL) 100 MG 24 hr tablet Take 1 tablet (100 mg total) by mouth daily. Take with or immediately following a meal. 90 tablet 3  . non-metallic deodorant (ALRA) MISC Apply 1 application topically daily as needed.    . pilocarpine (SALAGEN) 5 MG tablet Take 5 mg by mouth daily.    . Pseudoephedrine-APAP-DM (DAYQUIL MULTI-SYMPTOM PO) Take 1 tablet by mouth daily as needed (sinus.).    Marland Kitchen vitamin C (ASCORBIC ACID) 500 MG tablet Take 500 mg by mouth every morning.     . vitamin E 200 UNIT capsule Take 200 Units by mouth every morning.     . zolpidem (AMBIEN CR) 12.5 MG CR tablet Take 1 tablet (12.5 mg total) by mouth at bedtime as needed for sleep. 30 tablet 0   No current facility-administered medications for this visit.     PHYSICAL EXAMINATION: ECOG PERFORMANCE STATUS: 1 - Symptomatic but completely ambulatory  Vitals:   11/27/15 0944  BP: (!) 142/85  Pulse: 82  Resp: 18  Temp: 98.9 F (37.2 C)   Filed Weights   11/27/15 0944  Weight: 198 lb 9.6 oz (90.1 kg)    GENERAL:alert, no distress and comfortable SKIN: skin color, texture, turgor are normal, no rashes or significant lesions EYES: normal, Conjunctiva are pink and non-injected, sclera clear OROPHARYNX:no exudate, no erythema and lips, buccal mucosa, and tongue normal  NECK: supple, thyroid normal size, non-tender, without nodularity LYMPH:  no palpable lymphadenopathy in the cervical, axillary or inguinal LUNGS: clear to auscultation and percussion with normal breathing effort HEART: regular rate & rhythm and no murmurs and no lower extremity edema ABDOMEN:abdomen soft, non-tender and normal bowel sounds MUSCULOSKELETAL:no cyanosis of digits and no clubbing  NEURO: alert & oriented x 3 with fluent speech, no focal motor/sensory deficits EXTREMITIES: No lower extremity edema  LABORATORY DATA:  I have reviewed the data as listed   Chemistry      Component Value Date/Time   NA 143  08/14/2015 1446   K 3.7 08/14/2015 1446   CL 109 (H) 08/19/2012 1021   CO2 27 08/14/2015 1446   BUN 9.8 08/14/2015 1446   CREATININE 0.8 08/14/2015 1446      Component Value Date/Time   CALCIUM 9.3 08/14/2015 1446   ALKPHOS 61 08/14/2015 1446   AST 19 08/14/2015 1446   ALT 14 08/14/2015 1446   BILITOT 0.81 08/14/2015 1446       Lab Results  Component Value Date   WBC 2.9 (L) 11/27/2015   HGB 14.1 11/27/2015   HCT 41.6 11/27/2015   MCV 99.3 11/27/2015   PLT 60 (L) 11/27/2015   NEUTROABS 1.8 11/27/2015     ASSESSMENT & PLAN:  Breast cancer of upper-inner quadrant of left female breast Left breast invasive ductal carcinoma ER/PR positive HER-2 negative Ki-67 94% diagnosed October 2013 status post 4 cycles of TCH Perjeta followed by left mastectomy which revealed a 0.8 cm grade 1 IDC, 1 sentinel node micrometastatic disease ER/PR 100% positive status post radiation therapy and completed adjuvant trastuzumab 01/12/2013 currently on Arimidex 1 mg daily from 07/29/2012  Arimidex toxicities: 1. Occasional hot flashes 2. Muscle stiffness in the morning 3. Patient gets bone densities every other year and they have been  normal according to her  Breast cancer surveillance: right breast mammograms 02/15/2015: Early dystrophic calcifications 1.3 cm: Stereotactic biopsy on 03/14/2015: Fat necrosis with calcifications  MDS (myelodysplastic syndrome), low grade Thrombocytopenia and leukopenia: Bone marrow biopsy 10/17/2014 suggestive of low-grade MDS refractory cytopenia with multilineage dysplasia cytogenetics revealed 20 q- suggestive of good prognosis MDS, also associated with myelofibrosis, no increase in blasts, flow cytometry normal  Today's blood work was reviewed which showed white count of 2.9 with an ANC of 1.8, hemoglobin 14, platelets 60 (were 57 and 61). Overall the numbers appear to be stable over the past 6 months. Patient had a second opinion consultation for the MDS at  [facility]. They agreed with the current treatment plan of surveillance and observation. If her blood count significantly get worse she will need another bone marrow biopsy.  Return to clinic in 3 months for follow-up (patient wishes to be seen every 3 months)    Orders Placed This Encounter  Procedures  . CBC with Differential    Standing Status:   Standing    Number of Occurrences:   20    Standing Expiration Date:   05/26/2017   The patient has a good understanding of the overall plan. she agrees with it. she will call with any problems that may develop before the next visit here.   Rulon Eisenmenger, MD 11/27/15

## 2016-02-12 DIAGNOSIS — N958 Other specified menopausal and perimenopausal disorders: Secondary | ICD-10-CM | POA: Diagnosis not present

## 2016-02-12 DIAGNOSIS — Z6833 Body mass index (BMI) 33.0-33.9, adult: Secondary | ICD-10-CM | POA: Diagnosis not present

## 2016-02-12 DIAGNOSIS — M8588 Other specified disorders of bone density and structure, other site: Secondary | ICD-10-CM | POA: Diagnosis not present

## 2016-02-12 DIAGNOSIS — Z124 Encounter for screening for malignant neoplasm of cervix: Secondary | ICD-10-CM | POA: Diagnosis not present

## 2016-02-18 NOTE — Assessment & Plan Note (Signed)
Left breast invasive ductal carcinoma ER/PR positive HER-2 negative Ki-67 94% diagnosed October 2013 status post 4 cycles of [place] followed by left mastectomy which revealed a 0.8 cm grade 1 IDC, 1 sentinel node micrometastatic disease ER/PR 100% positive status post radiation therapy and completed adjuvant trastuzumab 01/12/2013 currently on Arimidex 1 mg daily from 07/29/2012  Arimidex toxicities: 1. Occasional hot flashes 2. Muscle stiffness in the morning 3. Patient gets bone densities every other year and they have been normal according to her  Breast cancer surveillance: right breast mammograms 02/15/2015: Early dystrophic calcifications 1.3 cm: Stereotactic biopsy on 03/14/2015: Fat necrosis with calcifications  MDS (myelodysplastic syndrome), low grade Thrombocytopenia and leukopenia: Bone marrow biopsy 10/17/2014 suggestive of low-grade MDS refractory cytopenia with multilineage dysplasia cytogenetics revealed 20 q- suggestive of good prognosis MDS, also associated with myelofibrosis, no increase in blasts, flow cytometry normal  Today's blood work was reviewed which showed white count of 2.9 with an ANC of 1.8, hemoglobin 14, platelets 60 (were 57 and 61). Overall the numbers appear to be stable over the past 6 months.  Patient had a second opinion consultation for the MDS at [facility]. They agreed with the current treatment plan of surveillance and observation. If her blood count significantly get worse she will need another bone marrow biopsy.  Return to clinic in 3 months for follow-up (patient wishes to be seen every 3 months)

## 2016-02-19 ENCOUNTER — Encounter: Payer: Self-pay | Admitting: Hematology and Oncology

## 2016-02-19 ENCOUNTER — Other Ambulatory Visit: Payer: Self-pay | Admitting: Hematology and Oncology

## 2016-02-19 ENCOUNTER — Ambulatory Visit (HOSPITAL_BASED_OUTPATIENT_CLINIC_OR_DEPARTMENT_OTHER): Payer: Medicare Other | Admitting: Hematology and Oncology

## 2016-02-19 ENCOUNTER — Other Ambulatory Visit (HOSPITAL_BASED_OUTPATIENT_CLINIC_OR_DEPARTMENT_OTHER): Payer: Medicare Other

## 2016-02-19 DIAGNOSIS — D72819 Decreased white blood cell count, unspecified: Secondary | ICD-10-CM

## 2016-02-19 DIAGNOSIS — C50212 Malignant neoplasm of upper-inner quadrant of left female breast: Secondary | ICD-10-CM

## 2016-02-19 DIAGNOSIS — D4620 Refractory anemia with excess of blasts, unspecified: Secondary | ICD-10-CM | POA: Diagnosis not present

## 2016-02-19 DIAGNOSIS — D696 Thrombocytopenia, unspecified: Secondary | ICD-10-CM | POA: Diagnosis not present

## 2016-02-19 DIAGNOSIS — Z170 Estrogen receptor positive status [ER+]: Principal | ICD-10-CM

## 2016-02-19 DIAGNOSIS — F5101 Primary insomnia: Secondary | ICD-10-CM

## 2016-02-19 LAB — CBC WITH DIFFERENTIAL/PLATELET
BASO%: 0.3 % (ref 0.0–2.0)
BASOS ABS: 0 10*3/uL (ref 0.0–0.1)
EOS ABS: 0 10*3/uL (ref 0.0–0.5)
EOS%: 1.4 % (ref 0.0–7.0)
HEMATOCRIT: 42.1 % (ref 34.8–46.6)
HEMOGLOBIN: 14.1 g/dL (ref 11.6–15.9)
LYMPH#: 0.9 10*3/uL (ref 0.9–3.3)
LYMPH%: 30.5 % (ref 14.0–49.7)
MCH: 32.9 pg (ref 25.1–34.0)
MCHC: 33.5 g/dL (ref 31.5–36.0)
MCV: 98.4 fL (ref 79.5–101.0)
MONO#: 0.3 10*3/uL (ref 0.1–0.9)
MONO%: 10.8 % (ref 0.0–14.0)
NEUT#: 1.7 10*3/uL (ref 1.5–6.5)
NEUT%: 57 % (ref 38.4–76.8)
NRBC: 0 % (ref 0–0)
PLATELETS: 59 10*3/uL — AB (ref 145–400)
RBC: 4.28 10*6/uL (ref 3.70–5.45)
RDW: 13.9 % (ref 11.2–14.5)
WBC: 3 10*3/uL — ABNORMAL LOW (ref 3.9–10.3)

## 2016-02-19 MED ORDER — ZOLPIDEM TARTRATE ER 12.5 MG PO TBCR: 12.5000 mg | EXTENDED_RELEASE_TABLET | Freq: Every evening | ORAL | 0 refills | Status: DC | PRN

## 2016-02-19 MED ORDER — DOXYCYCLINE HYCLATE 100 MG PO TABS: 100.0000 mg | ORAL_TABLET | Freq: Two times a day (BID) | ORAL | 0 refills | Status: DC

## 2016-02-19 NOTE — Progress Notes (Signed)
Patient Care Team: Arvella Nigh, MD as PCP - General (Obstetrics and Gynecology) Bo Merino, MD as Consulting Physician (Rheumatology)  DIAGNOSIS:  Encounter Diagnoses  Name Primary?  . Malignant neoplasm of upper-inner quadrant of left breast in female, estrogen receptor positive ([place])   . Primary insomnia   . Myelodysplastic syndrome, low grade ([place])     SUMMARY OF ONCOLOGIC HISTORY:   Breast cancer of upper-inner quadrant of left female breast ([place])   12/31/2011 - 03/11/2012 Neo-Adjuvant Chemotherapy    Taxotere Herceptin Perjeta 4 followed by Herceptin maintenance completed 01/29/2013      04/06/2012 Surgery    Left breast mastectomy: IDC grade 1; 0.8 cm, low-grade DCIS, LV I identified, 1/2 lymph node Micro met, ER/PR positive HER-2 positive Ki-67 94%      05/04/2012 - 06/20/2012 Radiation Therapy    Adjuvant radiation therapy      07/29/2012 -  Anti-estrogen oral therapy    Arimidex 1 mg daily      10/17/2014 Procedure    Bone marrow biopsy for thrombocytopenia and leukopenia: Hypercellular marrow with trilineage dysplasia most apparent in megakaryocytes with associated myelofibrosis: low-grade MDS with refractory cytopenias and multilineage dysplasia (20q- partial del)       MDS (myelodysplastic syndrome), low grade ([place])   10/17/2014 Initial Diagnosis    MDS (myelodysplastic syndrome), low grade , refractory cytopenia with multi nature dysplasia cytogenetics -20q (associated with good prognosis MDS)       CHIEF COMPLIANT: Follow-up of myelodysplastic syndrome and left breast cancer on anastrozole  INTERVAL HISTORY: LYN JOENS is a severe with above-mentioned history left breast cancer currently on antiestrogen therapy with anastrozole. She was also diagnosed with therapy-related MDS. She is currently on observation. Her blood counts remained stable. She reports no new problems or concerns. Denies any lumps or nodules in the breasts.  REVIEW OF SYSTEMS:     Constitutional: Denies fevers, chills or abnormal weight loss Eyes: Denies blurriness of vision Ears, nose, mouth, throat, and face: Denies mucositis or sore throat Respiratory: Denies cough, dyspnea or wheezes Cardiovascular: Denies palpitation, chest discomfort Gastrointestinal:  Denies nausea, heartburn or change in bowel habits Skin: Denies abnormal skin rashes Lymphatics: Denies new lymphadenopathy or easy bruising Neurological:Denies numbness, tingling or new weaknesses Behavioral/Psych: Mood is stable, no new changes  Extremities: No lower extremity edema Breast:  denies any pain or lumps or nodules in either breasts All other systems were reviewed with the patient and are negative.  I have reviewed the past medical history, past surgical history, social history and family history with the patient and they are unchanged from previous note.  ALLERGIES:  is allergic to cephalosporins; codeine; and tape.  MEDICATIONS:  Current Outpatient Prescriptions  Medication Sig Dispense Refill  . anastrozole (ARIMIDEX) 1 MG tablet TAKE 1 TABLET (1 MG TOTAL) BY MOUTH DAILY. 90 tablet 3  . Calcium Carbonate-Vit D-Min (CALCIUM 1200 PO) Take 1 tablet by mouth every morning.    . cholecalciferol (VITAMIN D) 1000 UNITS tablet Take by mouth daily.     . cyanocobalamin 100 MCG tablet Take 100 mcg by mouth daily.    Marland Kitchen doxycycline (VIBRA-TABS) 100 MG tablet Take 1 tablet (100 mg total) by mouth 2 (two) times daily. 14 tablet 0  . fluocinonide-emollient (LIDEX-E) 0.05 % cream Apply 1 application topically daily as needed (welps and rash.).     Marland Kitchen gabapentin (NEURONTIN) 600 MG tablet Take 600 mg by mouth 2 (two) times daily.    Marland Kitchen HYDROcodone-acetaminophen (NORCO/VICODIN) 5-325  MG per tablet Take 1 tablet by mouth as needed. (Patient taking differently: Take 1 tablet by mouth at bedtime as needed for moderate pain. ) 30 tablet 0  . metoprolol succinate (TOPROL-XL) 100 MG 24 hr tablet Take 1 tablet (100 mg  total) by mouth daily. Take with or immediately following a meal. 90 tablet 3  . non-metallic deodorant (ALRA) MISC Apply 1 application topically daily as needed.    . pilocarpine (SALAGEN) 5 MG tablet Take 5 mg by mouth daily.    . Pseudoephedrine-APAP-DM (DAYQUIL MULTI-SYMPTOM PO) Take 1 tablet by mouth daily as needed (sinus.).    . vitamin C (ASCORBIC ACID) 500 MG tablet Take 500 mg by mouth every morning.     . vitamin E 200 UNIT capsule Take 200 Units by mouth every morning.     . zolpidem (AMBIEN CR) 12.5 MG CR tablet Take 1 tablet (12.5 mg total) by mouth at bedtime as needed for sleep. 30 tablet 0   No current facility-administered medications for this visit.     PHYSICAL EXAMINATION: ECOG PERFORMANCE STATUS: 1 - Symptomatic but completely ambulatory  Vitals:   02/19/16 0953  BP: (!) 150/76  Pulse: 79  Resp: 18  Temp: 98.6 F (37 C)   Filed Weights   02/19/16 0953  Weight: 202 lb 6.4 oz (91.8 kg)    GENERAL:alert, no distress and comfortable SKIN: skin color, texture, turgor are normal, no rashes or significant lesions EYES: normal, Conjunctiva are pink and non-injected, sclera clear OROPHARYNX:no exudate, no erythema and lips, buccal mucosa, and tongue normal  NECK: supple, thyroid normal size, non-tender, without nodularity LYMPH:  no palpable lymphadenopathy in the cervical, axillary or inguinal LUNGS: clear to auscultation and percussion with normal breathing effort HEART: regular rate & rhythm and no murmurs and no lower extremity edema ABDOMEN:abdomen soft, non-tender and normal bowel sounds MUSCULOSKELETAL:no cyanosis of digits and no clubbing  NEURO: alert & oriented x 3 with fluent speech, no focal motor/sensory deficits EXTREMITIES: No lower extremity edema BREAST: No palpable masses or nodules in either right or left breasts. No palpable axillary supraclavicular or infraclavicular adenopathy no breast tenderness or nipple discharge. (exam performed in the  presence of a chaperone)  LABORATORY DATA:  I have reviewed the data as listed   Chemistry      Component Value Date/Time   NA 143 08/14/2015 1446   K 3.7 08/14/2015 1446   CL 109 (H) 08/19/2012 1021   CO2 27 08/14/2015 1446   BUN 9.8 08/14/2015 1446   CREATININE 0.8 08/14/2015 1446      Component Value Date/Time   CALCIUM 9.3 08/14/2015 1446   ALKPHOS 61 08/14/2015 1446   AST 19 08/14/2015 1446   ALT 14 08/14/2015 1446   BILITOT 0.81 08/14/2015 1446       Lab Results  Component Value Date   WBC 3.0 (L) 02/19/2016   HGB 14.1 02/19/2016   HCT 42.1 02/19/2016   MCV 98.4 02/19/2016   PLT 59 (L) 02/19/2016   NEUTROABS 1.7 02/19/2016    ASSESSMENT & PLAN:  Breast cancer of upper-inner quadrant of left female breast Left breast invasive ductal carcinoma ER/PR positive HER-2 negative Ki-67 94% diagnosed October 2013 status post 4 cycles of TCH Perjeta followed by left mastectomy which revealed a 0.8 cm grade 1 IDC, 1 sentinel node micrometastatic disease ER/PR 100% positive status post radiation therapy and completed adjuvant trastuzumab 01/12/2013 currently on Arimidex 1 mg daily from 07/29/2012  Arimidex toxicities: 1. Occasional   hot flashes 2. Muscle stiffness in the morning 3. Patient gets bone densities every other year and they have been normal according to her  Breast cancer surveillance: right breast mammograms 02/15/2015: Early dystrophic calcifications 1.3 cm: Stereotactic biopsy on 03/14/2015: Fat necrosis with calcifications  MDS (myelodysplastic syndrome), low grade Thrombocytopenia and leukopenia: Bone marrow biopsy 10/17/2014 suggestive of low-grade MDS refractory cytopenia with multilineage dysplasia cytogenetics revealed 20 q- suggestive of good prognosis MDS, also associated with myelofibrosis, no increase in blasts, flow cytometry normal  Today's blood work was reviewed which showed white count of 3 with an ANC of 1.7, hemoglobin 14, platelets 59 (were  57 and 61). Overall the numbers appear to be stable over the past 6 months.  Patient had a second opinion consultation for the MDS at [facility]. They agreed with the current treatment plan of surveillance and observation. If her blood count significantly get worse she will need another bone marrow biopsy.  Return to clinic in 3 months for labs and every 6 months for follow-up   Orders Placed This Encounter  Procedures  . CBC with Differential    Standing Status:   Standing    Number of Occurrences:   20    Standing Expiration Date:   02/18/2017   The patient has a good understanding of the overall plan. she agrees with it. she will call with any problems that may develop before the next visit here.   Rulon Eisenmenger, MD 02/19/16

## 2016-02-21 ENCOUNTER — Other Ambulatory Visit: Payer: Self-pay | Admitting: Hematology and Oncology

## 2016-02-21 DIAGNOSIS — F5101 Primary insomnia: Secondary | ICD-10-CM

## 2016-02-21 DIAGNOSIS — D4620 Refractory anemia with excess of blasts, unspecified: Secondary | ICD-10-CM

## 2016-03-03 DIAGNOSIS — Z1231 Encounter for screening mammogram for malignant neoplasm of breast: Secondary | ICD-10-CM | POA: Diagnosis not present

## 2016-03-03 DIAGNOSIS — N950 Postmenopausal bleeding: Secondary | ICD-10-CM | POA: Diagnosis not present

## 2016-03-26 ENCOUNTER — Other Ambulatory Visit: Payer: Self-pay | Admitting: Hematology and Oncology

## 2016-03-26 DIAGNOSIS — D4620 Refractory anemia with excess of blasts, unspecified: Secondary | ICD-10-CM

## 2016-03-26 DIAGNOSIS — F5101 Primary insomnia: Secondary | ICD-10-CM

## 2016-05-15 ENCOUNTER — Other Ambulatory Visit: Payer: Self-pay | Admitting: Emergency Medicine

## 2016-05-15 DIAGNOSIS — D4620 Refractory anemia with excess of blasts, unspecified: Secondary | ICD-10-CM

## 2016-05-15 DIAGNOSIS — F5101 Primary insomnia: Secondary | ICD-10-CM

## 2016-05-15 MED ORDER — ZOLPIDEM TARTRATE ER 12.5 MG PO TBCR: 12.5000 mg | EXTENDED_RELEASE_TABLET | Freq: Every evening | ORAL | 0 refills | Status: DC | PRN

## 2016-05-19 ENCOUNTER — Other Ambulatory Visit (HOSPITAL_BASED_OUTPATIENT_CLINIC_OR_DEPARTMENT_OTHER): Payer: Medicare Other

## 2016-05-19 DIAGNOSIS — C50212 Malignant neoplasm of upper-inner quadrant of left female breast: Secondary | ICD-10-CM

## 2016-05-19 DIAGNOSIS — D4620 Refractory anemia with excess of blasts, unspecified: Secondary | ICD-10-CM

## 2016-05-19 LAB — CBC WITH DIFFERENTIAL/PLATELET
BASO%: 0.6 % (ref 0.0–2.0)
Basophils Absolute: 0 10*3/uL (ref 0.0–0.1)
EOS%: 1.5 % (ref 0.0–7.0)
Eosinophils Absolute: 0.1 10*3/uL (ref 0.0–0.5)
HEMATOCRIT: 43.3 % (ref 34.8–46.6)
HGB: 14.5 g/dL (ref 11.6–15.9)
LYMPH%: 33.3 % (ref 14.0–49.7)
MCH: 33 pg (ref 25.1–34.0)
MCHC: 33.5 g/dL (ref 31.5–36.0)
MCV: 98.6 fL (ref 79.5–101.0)
MONO#: 0.3 10*3/uL (ref 0.1–0.9)
MONO%: 8.8 % (ref 0.0–14.0)
NEUT%: 55.8 % (ref 38.4–76.8)
NEUTROS ABS: 1.9 10*3/uL (ref 1.5–6.5)
Platelets: 49 10*3/uL — ABNORMAL LOW (ref 145–400)
RBC: 4.39 10*6/uL (ref 3.70–5.45)
RDW: 14.1 % (ref 11.2–14.5)
WBC: 3.4 10*3/uL — AB (ref 3.9–10.3)
lymph#: 1.1 10*3/uL (ref 0.9–3.3)
nRBC: 0 % (ref 0–0)

## 2016-05-21 DIAGNOSIS — D6861 Antiphospholipid syndrome: Secondary | ICD-10-CM | POA: Diagnosis not present

## 2016-05-21 DIAGNOSIS — Z9012 Acquired absence of left breast and nipple: Secondary | ICD-10-CM | POA: Diagnosis not present

## 2016-05-21 DIAGNOSIS — Z853 Personal history of malignant neoplasm of breast: Secondary | ICD-10-CM | POA: Diagnosis not present

## 2016-05-21 DIAGNOSIS — N63 Unspecified lump in unspecified breast: Secondary | ICD-10-CM | POA: Diagnosis not present

## 2016-05-21 DIAGNOSIS — M3500 Sicca syndrome, unspecified: Secondary | ICD-10-CM | POA: Diagnosis not present

## 2016-05-21 DIAGNOSIS — D469 Myelodysplastic syndrome, unspecified: Secondary | ICD-10-CM | POA: Diagnosis not present

## 2016-05-21 DIAGNOSIS — T8544XD Capsular contracture of breast implant, subsequent encounter: Secondary | ICD-10-CM | POA: Diagnosis not present

## 2016-05-21 DIAGNOSIS — Z923 Personal history of irradiation: Secondary | ICD-10-CM | POA: Diagnosis not present

## 2016-05-21 DIAGNOSIS — N651 Disproportion of reconstructed breast: Secondary | ICD-10-CM | POA: Diagnosis not present

## 2016-05-28 ENCOUNTER — Other Ambulatory Visit: Payer: Self-pay | Admitting: Hematology and Oncology

## 2016-05-28 ENCOUNTER — Telehealth: Payer: Self-pay | Admitting: Hematology and Oncology

## 2016-05-28 DIAGNOSIS — C50212 Malignant neoplasm of upper-inner quadrant of left female breast: Secondary | ICD-10-CM

## 2016-05-28 DIAGNOSIS — Z170 Estrogen receptor positive status [ER+]: Principal | ICD-10-CM

## 2016-05-28 NOTE — Telephone Encounter (Signed)
Called patient and discussed the platelet issue She has felt a lump in the reconstructed breast. Will set her up for a U/S guided biopsy at [facility] center

## 2016-05-29 ENCOUNTER — Telehealth: Payer: Self-pay | Admitting: Hematology and Oncology

## 2016-05-29 NOTE — Telephone Encounter (Signed)
US guided biopsy to be scheduled once authorization is obtained

## 2016-06-02 DIAGNOSIS — L821 Other seborrheic keratosis: Secondary | ICD-10-CM | POA: Diagnosis not present

## 2016-06-02 DIAGNOSIS — Z85828 Personal history of other malignant neoplasm of skin: Secondary | ICD-10-CM | POA: Diagnosis not present

## 2016-06-02 DIAGNOSIS — L723 Sebaceous cyst: Secondary | ICD-10-CM | POA: Diagnosis not present

## 2016-06-02 DIAGNOSIS — D2230 Melanocytic nevi of unspecified part of face: Secondary | ICD-10-CM | POA: Diagnosis not present

## 2016-06-02 DIAGNOSIS — D2271 Melanocytic nevi of right lower limb, including hip: Secondary | ICD-10-CM | POA: Diagnosis not present

## 2016-06-02 DIAGNOSIS — L570 Actinic keratosis: Secondary | ICD-10-CM | POA: Diagnosis not present

## 2016-06-02 DIAGNOSIS — L918 Other hypertrophic disorders of the skin: Secondary | ICD-10-CM | POA: Diagnosis not present

## 2016-06-02 DIAGNOSIS — D225 Melanocytic nevi of trunk: Secondary | ICD-10-CM | POA: Diagnosis not present

## 2016-06-02 DIAGNOSIS — Z86018 Personal history of other benign neoplasm: Secondary | ICD-10-CM | POA: Diagnosis not present

## 2016-06-02 DIAGNOSIS — D2272 Melanocytic nevi of left lower limb, including hip: Secondary | ICD-10-CM | POA: Diagnosis not present

## 2016-06-11 ENCOUNTER — Ambulatory Visit
Admission: RE | Admit: 2016-06-11 | Discharge: 2016-06-11 | Disposition: A | Discharge status: Home / Self Care | Payer: Medicare Other | Source: Ambulatory Visit | Attending: Hematology and Oncology | Admitting: Hematology and Oncology

## 2016-06-11 ENCOUNTER — Other Ambulatory Visit: Payer: Self-pay | Admitting: Hematology and Oncology

## 2016-06-11 DIAGNOSIS — N6002 Solitary cyst of left breast: Secondary | ICD-10-CM | POA: Diagnosis not present

## 2016-06-11 DIAGNOSIS — Z170 Estrogen receptor positive status [ER+]: Principal | ICD-10-CM

## 2016-06-11 DIAGNOSIS — N6320 Unspecified lump in the left breast, unspecified quadrant: Secondary | ICD-10-CM

## 2016-06-11 DIAGNOSIS — Z853 Personal history of malignant neoplasm of breast: Secondary | ICD-10-CM

## 2016-06-11 DIAGNOSIS — C50212 Malignant neoplasm of upper-inner quadrant of left female breast: Secondary | ICD-10-CM

## 2016-06-12 ENCOUNTER — Ambulatory Visit
Admission: RE | Admit: 2016-06-12 | Discharge: 2016-06-12 | Disposition: A | Discharge status: Home / Self Care | Payer: Medicare Other | Source: Ambulatory Visit | Attending: Hematology and Oncology | Admitting: Hematology and Oncology

## 2016-06-12 ENCOUNTER — Other Ambulatory Visit (HOSPITAL_COMMUNITY)
Admission: RE | Admit: 2016-06-12 | Discharge: 2016-06-12 | Disposition: A | Discharge status: Home / Self Care | Payer: Medicare Other | Source: Ambulatory Visit | Attending: Diagnostic Radiology | Admitting: Diagnostic Radiology

## 2016-06-12 DIAGNOSIS — N6320 Unspecified lump in the left breast, unspecified quadrant: Secondary | ICD-10-CM

## 2016-06-12 DIAGNOSIS — N6002 Solitary cyst of left breast: Secondary | ICD-10-CM | POA: Diagnosis not present

## 2016-06-12 DIAGNOSIS — Z9012 Acquired absence of left breast and nipple: Secondary | ICD-10-CM | POA: Insufficient documentation

## 2016-07-16 ENCOUNTER — Telehealth: Payer: Self-pay | Admitting: Cardiology

## 2016-07-16 DIAGNOSIS — I471 Supraventricular tachycardia: Secondary | ICD-10-CM

## 2016-07-16 MED ORDER — METOPROLOL SUCCINATE ER 100 MG PO TB24: 100.0000 mg | ORAL_TABLET | Freq: Every day | ORAL | 0 refills | Status: DC

## 2016-07-16 NOTE — Telephone Encounter (Signed)
New message      *STAT* If patient is at the pharmacy, call can be transferred to refill team.   1. Which medications need to be refilled? (please list name of each medication and dose if known)  metoprolol succinate (TOPROL-XL) 100 MG 24 hr tablet Take 1 tablet (100 mg total) by mouth daily. Take with or immediately following a meal.     2. Which pharmacy/location (including street and city if local pharmacy) is medication to be sent to? [facility]  3. Do they need a 30 day or 90 day supply? [place]

## 2016-07-31 ENCOUNTER — Telehealth: Payer: Self-pay | Admitting: Cardiology

## 2016-07-31 DIAGNOSIS — Z853 Personal history of malignant neoplasm of breast: Secondary | ICD-10-CM | POA: Diagnosis not present

## 2016-07-31 DIAGNOSIS — I471 Supraventricular tachycardia: Secondary | ICD-10-CM | POA: Diagnosis not present

## 2016-07-31 DIAGNOSIS — D469 Myelodysplastic syndrome, unspecified: Secondary | ICD-10-CM | POA: Diagnosis not present

## 2016-07-31 DIAGNOSIS — Z9012 Acquired absence of left breast and nipple: Secondary | ICD-10-CM | POA: Diagnosis not present

## 2016-07-31 DIAGNOSIS — G629 Polyneuropathy, unspecified: Secondary | ICD-10-CM | POA: Diagnosis not present

## 2016-07-31 NOTE — Telephone Encounter (Signed)
Returned call to patient-patient reports she has of SVT and has not had episode in 1.5 years.  Reports she is having an episode currently that began at 1:15 PM and she has tried the "tricks' to convert herself back but is not having any success.  Reports palpitations, denies dizziness, CP, SOB at current but reports she will soon if doesn't convert.  Reports she has been told before that if she has an episode during normal business hours to let us know and she could come in for adenosine.   No BP readings or HR readings, tried to help patient take manually but states she is unable as it is fast.  Spoke to Dr. Billey Co to go to ER.    Patient aware and has someone to drive her.

## 2016-07-31 NOTE — Telephone Encounter (Signed)
New Message     Pt is having a SVT episode and would like to stop by and have Dr [place] give her a treatment

## 2016-08-13 ENCOUNTER — Telehealth: Payer: Self-pay | Admitting: Hematology and Oncology

## 2016-08-13 NOTE — Telephone Encounter (Signed)
Confirmed appointment change with patient °

## 2016-08-19 ENCOUNTER — Other Ambulatory Visit: Payer: Medicare Other

## 2016-08-19 ENCOUNTER — Ambulatory Visit: Payer: Medicare Other | Admitting: Hematology and Oncology

## 2016-09-01 ENCOUNTER — Encounter: Payer: Self-pay | Admitting: Hematology and Oncology

## 2016-09-01 ENCOUNTER — Ambulatory Visit (HOSPITAL_BASED_OUTPATIENT_CLINIC_OR_DEPARTMENT_OTHER): Payer: Medicare Other | Admitting: Hematology and Oncology

## 2016-09-01 ENCOUNTER — Other Ambulatory Visit (HOSPITAL_BASED_OUTPATIENT_CLINIC_OR_DEPARTMENT_OTHER): Payer: Medicare Other

## 2016-09-01 DIAGNOSIS — D709 Neutropenia, unspecified: Secondary | ICD-10-CM

## 2016-09-01 DIAGNOSIS — C50212 Malignant neoplasm of upper-inner quadrant of left female breast: Secondary | ICD-10-CM | POA: Diagnosis not present

## 2016-09-01 DIAGNOSIS — D696 Thrombocytopenia, unspecified: Secondary | ICD-10-CM | POA: Diagnosis not present

## 2016-09-01 DIAGNOSIS — F5101 Primary insomnia: Secondary | ICD-10-CM

## 2016-09-01 DIAGNOSIS — D4620 Refractory anemia with excess of blasts, unspecified: Secondary | ICD-10-CM

## 2016-09-01 DIAGNOSIS — Z170 Estrogen receptor positive status [ER+]: Secondary | ICD-10-CM | POA: Diagnosis not present

## 2016-09-01 DIAGNOSIS — Z79811 Long term (current) use of aromatase inhibitors: Secondary | ICD-10-CM | POA: Diagnosis not present

## 2016-09-01 DIAGNOSIS — D46A Refractory cytopenia with multilineage dysplasia: Secondary | ICD-10-CM | POA: Diagnosis not present

## 2016-09-01 LAB — CBC WITH DIFFERENTIAL/PLATELET
BASO%: 0.6 % (ref 0.0–2.0)
BASOS ABS: 0 10*3/uL (ref 0.0–0.1)
EOS%: 0.6 % (ref 0.0–7.0)
Eosinophils Absolute: 0 10*3/uL (ref 0.0–0.5)
HCT: 43.2 % (ref 34.8–46.6)
HEMOGLOBIN: 14.2 g/dL (ref 11.6–15.9)
LYMPH%: 25.5 % (ref 14.0–49.7)
MCH: 32.4 pg (ref 25.1–34.0)
MCHC: 32.9 g/dL (ref 31.5–36.0)
MCV: 98.6 fL (ref 79.5–101.0)
MONO#: 0.3 10*3/uL (ref 0.1–0.9)
MONO%: 8.8 % (ref 0.0–14.0)
NEUT#: 2.1 10*3/uL (ref 1.5–6.5)
NEUT%: 64.5 % (ref 38.4–76.8)
NRBC: 0 % (ref 0–0)
Platelets: 56 10*3/uL — ABNORMAL LOW (ref 145–400)
RBC: 4.38 10*6/uL (ref 3.70–5.45)
RDW: 13.7 % (ref 11.2–14.5)
WBC: 3.3 10*3/uL — ABNORMAL LOW (ref 3.9–10.3)
lymph#: 0.8 10*3/uL — ABNORMAL LOW (ref 0.9–3.3)

## 2016-09-01 MED ORDER — ZOLPIDEM TARTRATE ER 12.5 MG PO TBCR: 12.5000 mg | EXTENDED_RELEASE_TABLET | Freq: Every evening | ORAL | 1 refills | Status: DC | PRN

## 2016-09-01 MED ORDER — ANASTROZOLE 1 MG PO TABS: 1.0000 mg | ORAL_TABLET | Freq: Every day | ORAL | 3 refills | Status: DC

## 2016-09-01 NOTE — Assessment & Plan Note (Addendum)
Left breast invasive ductal carcinoma ER/PR positive HER-2 negative Ki-67 94% diagnosed October 2013 status post 4 cycles of [place] followed by left mastectomy which revealed a 0.8 cm grade 1 IDC, 1 sentinel node micrometastatic disease ER/PR 100% positive status post radiation therapy and completed adjuvant trastuzumab 01/12/2013 currently on Arimidex 1 mg daily from 07/29/2012  Arimidex toxicities: 1. Occasional hot flashes 2. Muscle stiffness in the morning 3. Patient gets bone densities every other year and they have been normal according to her  Breast cancer surveillance: right breast mammograms 02/15/2015: Early dystrophic calcifications 1.3 cm: Stereotactic biopsy on 03/14/2015: Fat necrosis with calcifications  Ultrasound left axilla 06/11/2016: Nodule 1.8 cm benign cyst which was aspirated  Return to clinic in 3 months for follow-up (patient wishes to be seen every 3 months)

## 2016-09-01 NOTE — Progress Notes (Signed)
Patient Care Team: Arvella Nigh, MD as PCP - General (Obstetrics and Gynecology) Bo Merino, MD as Consulting Physician (Rheumatology)  DIAGNOSIS:  Encounter Diagnoses  Name Primary?  . Malignant neoplasm of upper-inner quadrant of left breast in female, estrogen receptor positive ([place])   . MDS (myelodysplastic syndrome), low grade ([place])   . Primary insomnia   . Myelodysplastic syndrome, low grade ([place])     SUMMARY OF ONCOLOGIC HISTORY:   Breast cancer of upper-inner quadrant of left female breast ([place])   12/31/2011 - 03/11/2012 Neo-Adjuvant Chemotherapy    Taxotere Herceptin Perjeta 4 followed by Herceptin maintenance completed 01/29/2013      04/06/2012 Surgery    Left breast mastectomy: IDC grade 1; 0.8 cm, low-grade DCIS, LV I identified, 1/2 lymph node Micro met, ER/PR positive HER-2 positive Ki-67 94%      05/04/2012 - 06/20/2012 Radiation Therapy    Adjuvant radiation therapy      07/29/2012 -  Anti-estrogen oral therapy    Arimidex 1 mg daily      10/17/2014 Procedure    Bone marrow biopsy for thrombocytopenia and leukopenia: Hypercellular marrow with trilineage dysplasia most apparent in megakaryocytes with associated myelofibrosis: low-grade MDS with refractory cytopenias and multilineage dysplasia (20q- partial del)       MDS (myelodysplastic syndrome), low grade ([place])   10/17/2014 Initial Diagnosis    MDS (myelodysplastic syndrome), low grade , refractory cytopenia with multi nature dysplasia cytogenetics -20q (associated with good prognosis MDS)       CHIEF COMPLIANT: Follow-up of breast cancer and MDS  INTERVAL HISTORY: Marie Anderson is a 71 year old with above-mentioned history of left breast cancer treated with neoadjuvant chemotherapy followed by mastectomy and radiation and is currently on Arimidex therapy. She is tolerating the treatment fairly well. She's been on Arimidex for the past 4 years. She was noted to have pancytopenia and a bone marrow  biopsy showed low-grade MDS with deletion 20q associated with good prognosis MDS. She is here for follow-up of both of these conditions. She denies any lumps or nodules in the breast. She denies any hot flashes myalgias from anastrozole.  REVIEW OF SYSTEMS:   Constitutional: Denies fevers, chills or abnormal weight loss Eyes: Denies blurriness of vision Ears, nose, mouth, throat, and face: Denies mucositis or sore throat Respiratory: Denies cough, dyspnea or wheezes Cardiovascular: Denies palpitation, chest discomfort Gastrointestinal:  Denies nausea, heartburn or change in bowel habits Skin: Denies abnormal skin rashes Lymphatics: Denies new lymphadenopathy or easy bruising Neurological:Denies numbness, tingling or new weaknesses Behavioral/Psych: Mood is stable, no new changes  Extremities: No lower extremity edema Breast:  denies any pain or lumps or nodules in either breasts All other systems were reviewed with the patient and are negative.  I have reviewed the past medical history, past surgical history, social history and family history with the patient and they are unchanged from previous note.  ALLERGIES:  is allergic to cephalosporins; codeine; and tape.  MEDICATIONS:  Current Outpatient Prescriptions  Medication Sig Dispense Refill  . anastrozole (ARIMIDEX) 1 MG tablet Take 1 tablet (1 mg total) by mouth daily. 90 tablet 3  . Calcium Carbonate-Vit D-Min (CALCIUM 1200 PO) Take 1 tablet by mouth every morning.    . cholecalciferol (VITAMIN D) 1000 UNITS tablet Take by mouth daily.     . cyanocobalamin 100 MCG tablet Take 100 mcg by mouth daily.    Marland Kitchen doxycycline (VIBRA-TABS) 100 MG tablet Take 1 tablet (100 mg total) by mouth 2 (two) times  daily. 14 tablet 0  . fluocinonide-emollient (LIDEX-E) 0.05 % cream Apply 1 application topically daily as needed (welps and rash.).     Marland Kitchen gabapentin (NEURONTIN) 600 MG tablet Take 600 mg by mouth 2 (two) times daily.    Marland Kitchen  HYDROcodone-acetaminophen (NORCO/VICODIN) 5-325 MG per tablet Take 1 tablet by mouth as needed. (Patient taking differently: Take 1 tablet by mouth at bedtime as needed for moderate pain. ) 30 tablet 0  . metoprolol succinate (TOPROL-XL) 100 MG 24 hr tablet Take 1 tablet (100 mg total) by mouth daily. Take with or immediately following a meal. 90 tablet 0  . non-metallic deodorant (ALRA) MISC Apply 1 application topically daily as needed.    . pilocarpine (SALAGEN) 5 MG tablet Take 5 mg by mouth daily.    . Pseudoephedrine-APAP-DM (DAYQUIL MULTI-SYMPTOM PO) Take 1 tablet by mouth daily as needed (sinus.).    Marland Kitchen vitamin C (ASCORBIC ACID) 500 MG tablet Take 500 mg by mouth every morning.     . vitamin E 200 UNIT capsule Take 200 Units by mouth every morning.     . zolpidem (AMBIEN CR) 12.5 MG CR tablet Take 1 tablet (12.5 mg total) by mouth at bedtime as needed for sleep. 30 tablet 1   No current facility-administered medications for this visit.     PHYSICAL EXAMINATION: ECOG PERFORMANCE STATUS: 1 - Symptomatic but completely ambulatory  Vitals:   09/01/16 1037  BP: 139/78  Pulse: 76  Resp: 18  Temp: 98.1 F (36.7 C)   Filed Weights   09/01/16 1037  Weight: 196 lb 3.2 oz (89 kg)    GENERAL:alert, no distress and comfortable SKIN: skin color, texture, turgor are normal, no rashes or significant lesions EYES: normal, Conjunctiva are pink and non-injected, sclera clear OROPHARYNX:no exudate, no erythema and lips, buccal mucosa, and tongue normal  NECK: supple, thyroid normal size, non-tender, without nodularity LYMPH:  no palpable lymphadenopathy in the cervical, axillary or inguinal LUNGS: clear to auscultation and percussion with normal breathing effort HEART: regular rate & rhythm and no murmurs and no lower extremity edema ABDOMEN:abdomen soft, non-tender and normal bowel sounds MUSCULOSKELETAL:no cyanosis of digits and no clubbing  NEURO: alert & oriented x 3 with fluent speech,  no focal motor/sensory deficits EXTREMITIES: No lower extremity edema BREAST: No palpable masses or nodules in either right or left breasts. No palpable axillary supraclavicular or infraclavicular adenopathy no breast tenderness or nipple discharge. (exam performed in the presence of a chaperone)  LABORATORY DATA:  I have reviewed the data as listed   Chemistry      Component Value Date/Time   NA 143 08/14/2015 1446   K 3.7 08/14/2015 1446   CL 109 (H) 08/19/2012 1021   CO2 27 08/14/2015 1446   BUN 9.8 08/14/2015 1446   CREATININE 0.8 08/14/2015 1446      Component Value Date/Time   CALCIUM 9.3 08/14/2015 1446   ALKPHOS 61 08/14/2015 1446   AST 19 08/14/2015 1446   ALT 14 08/14/2015 1446   BILITOT 0.81 08/14/2015 1446       Lab Results  Component Value Date   WBC 3.3 (L) 09/01/2016   HGB 14.2 09/01/2016   HCT 43.2 09/01/2016   MCV 98.6 09/01/2016   PLT 56 (L) 09/01/2016   NEUTROABS 2.1 09/01/2016    ASSESSMENT & PLAN:  Breast cancer of upper-inner quadrant of left female breast Left breast invasive ductal carcinoma ER/PR positive HER-2 negative Ki-67 94% diagnosed October 2013 status post 4  cycles of [place] followed by left mastectomy which revealed a 0.8 cm grade 1 IDC, 1 sentinel node micrometastatic disease ER/PR 100% positive status post radiation therapy and completed adjuvant trastuzumab 01/12/2013 currently on Arimidex 1 mg daily from 07/29/2012  Arimidex toxicities: 1. Occasional hot flashes 2. Muscle stiffness in the morning 3. Patient gets bone densities every other year and they have been normal according to her  Breast cancer surveillance: right breast mammograms 02/15/2015: Early dystrophic calcifications 1.3 cm: Stereotactic biopsy on 03/14/2015: Fat necrosis with calcifications  Ultrasound left axilla 06/11/2016: Nodule 1.8 cm benign cyst which was aspirated  Return to clinic in 3 months for follow-up (patient wishes to be seen every 3 months)     MDS (myelodysplastic syndrome), low grade  Thrombocytopenia and leukopenia: Bone marrow biopsy 10/17/2014 suggestive of low-grade MDS refractory cytopenia with multilineage dysplasia cytogenetics revealed 20 q- suggestive of good prognosis MDS, also associated with myelofibrosis, no increase in blasts, flow cytometry normal  Lab review:Platelet count today is 56. She does not have any signs and symptoms of bleeding.  Patient had a second opinion consultation for the MDS at [facility]. They agreed with the current treatment plan of surveillance and observation. If her blood count significantly get worse she will need another bone marrow biopsy.  Return to clinic in 6 months for follow-up with Mendel Ryder in survivorship clinic I spent 25 minutes talking to the patient of which more than half was spent in counseling and coordination of care.  Orders Placed This Encounter  Procedures  . CBC with Differential/Platelet    Standing Status:   Future    Standing Expiration Date:   10/06/2017   The patient has a good understanding of the overall plan. she agrees with it. she will call with any problems that may develop before the next visit here.   Rulon Eisenmenger, MD 09/01/16

## 2016-09-01 NOTE — Assessment & Plan Note (Signed)
MDS (myelodysplastic syndrome), low grade Thrombocytopenia and leukopenia: Bone marrow biopsy 10/17/2014 suggestive of low-grade MDS refractory cytopenia with multilineage dysplasia cytogenetics revealed 20 q- suggestive of good prognosis MDS, also associated with myelofibrosis, no increase in blasts, flow cytometry normal  Lab review:  Patient had a second opinion consultation for the MDS at [facility]. They agreed with the current treatment plan of surveillance and observation. If her blood count significantly get worse she will need another bone marrow biopsy.

## 2016-09-16 NOTE — Progress Notes (Signed)
Marie Anderson Date of Birth: Jul 28, 1945 Medical Record #[id number]  History of Present Illness: Marie Anderson is seen for  followup SVT. She has a history of supraventricular tachycardia that has been well controlled with metoprolol. She also has a history of breast cancer. She had a left mastectomy with reconstruction. She was treated with Herceptin. Serial Echos have been normal. Previously her last episode of SVT requiring Adenosine was in November 2013. In July 2015 she had an episode that responded to Valsalva maneuver.   She was seen in the ED in [place] on July 31, 2016 with SVT rate 185. Converted with IV adenosine. Labs revealed plts 77K. Otherwise ok. Troponin negative.   Since does have a history of myelodysplasia syndrome. This has been felt to be low grade. Thrombocytopenia and leukopenia.  She also has been diagnosed with Sjogren's syndrome and antiphospholipid syndrome.    On follow up today she is feeling very well. No bleeding. no recurrent SVT. Is scheduled for colonoscopy.    Current Outpatient Prescriptions on File Prior to Visit  Medication Sig Dispense Refill  . anastrozole (ARIMIDEX) 1 MG tablet Take 1 tablet (1 mg total) by mouth daily. 90 tablet 3  . Calcium Carbonate-Vit D-Min (CALCIUM 1200 PO) Take 1 tablet by mouth every morning.    . cholecalciferol (VITAMIN D) 1000 UNITS tablet Take by mouth daily.     . cyanocobalamin 100 MCG tablet Take 100 mcg by mouth daily.    Marland Kitchen doxycycline (VIBRA-TABS) 100 MG tablet Take 1 tablet (100 mg total) by mouth 2 (two) times daily. (Patient taking differently: Take 100 mg by mouth as directed. ) 14 tablet 0  . fluocinonide-emollient (LIDEX-E) 0.05 % cream Apply 1 application topically daily as needed (welps and rash.).     Marland Kitchen gabapentin (NEURONTIN) 600 MG tablet Take 600 mg by mouth 2 (two) times daily.    . non-metallic deodorant Jethro Poling) MISC Apply 1 application topically daily as needed.    . pilocarpine (SALAGEN) 5 MG tablet  Take 5 mg by mouth daily.    . Pseudoephedrine-APAP-DM (DAYQUIL MULTI-SYMPTOM PO) Take 1 tablet by mouth daily as needed (sinus.).    Marland Kitchen vitamin C (ASCORBIC ACID) 500 MG tablet Take 500 mg by mouth every morning.     . vitamin E 200 UNIT capsule Take 200 Units by mouth every morning.     . zolpidem (AMBIEN CR) 12.5 MG CR tablet Take 1 tablet (12.5 mg total) by mouth at bedtime as needed for sleep. 30 tablet 1   No current facility-administered medications on file prior to visit.     Allergies  Allergen Reactions  . Cephalexin   . Cephalosporins     headache  . Codeine Nausea Only  . Tape Other (See Comments)    Sensitivity to bandaids and tape that cause blisters and redness    Past Medical History:  Diagnosis Date  . Anemia   . Antiphospholipid antibody syndrome ([place])   . Breast cancer ([place]) 12/04/11   left- inv ductal ca, DCIS, ER/PR +, HER 2 +  . History of chemotherapy 12/31/11 -03/11/12   s/p 4 cycles  . History of radiation therapy 05/04/12-06/20/12   left breast/  . Myelodysplasia   . Neuropathy   . PONV (postoperative nausea and vomiting)   . SVT (supraventricular tachycardia) ([facility])     Past Surgical History:  Procedure Laterality Date  . BREAST RECONSTRUCTION WITH PLACEMENT OF TISSUE EXPANDER AND FLEX HD (ACELLULAR HYDRATED DERMIS)  04/06/2012  Procedure: BREAST RECONSTRUCTION WITH PLACEMENT OF TISSUE EXPANDER AND FLEX HD (ACELLULAR HYDRATED DERMIS);  Surgeon: Theodoro Kos, DO;  Location: [facility];  Service: Plastics;  Laterality: Left;  IMMEDIATE LEFT BREAST RECONSTRUCTION WITH PLACEMENT OF TISSUE EXPANDER AND ALLODERM  . COLONOSCOPY    . DILATION AND CURETTAGE OF UTERUS    . EYE SURGERY     age 47-lt eye growth  . LIPOSUCTION WITH LIPOFILLING N/A 01/19/2013   Procedure: LIPOSUCTION WITH LIPOFILLING;  Surgeon: Theodoro Kos, DO;  Location: [facility];  Service: Plastics;  Laterality: N/A;  . MASTECTOMY W/ SENTINEL NODE BIOPSY  04/06/2012    Procedure: MASTECTOMY WITH SENTINEL LYMPH NODE BIOPSY;  Surgeon: Rolm Bookbinder, MD;  Location: [place];  Service: General;  Laterality: Left;  left mastectomy, left axillary sentinel node biopsy  . MASTOPEXY Right 01/19/2013   Procedure: RIGHT BREAST PLACEMENT OF IMPLANT WITH MASTOPEXY;  Surgeon: Theodoro Kos, DO;  Location: [facility];  Service: Plastics;  Laterality: Right;  . PORT-A-CATH REMOVAL Right 01/19/2013   Procedure: REMOVAL PORT-A-CATH;  Surgeon: Rolm Bookbinder, MD;  Location: [facility];  Service: General;  Laterality: Right;  . PORTACATH PLACEMENT  12/23/2011   Procedure: INSERTION PORT-A-CATH;  Surgeon: Rolm Bookbinder, MD;  Location: [place] [facility];  Service: General;  Laterality: Right;  . REMOVAL OF TISSUE EXPANDER AND PLACEMENT OF IMPLANT Left 01/19/2013   Procedure: REMOVAL OF LEFT TISSUE EXPANDERS WITH PLACEMENT OF LEFT BREAST IMPLANT;  Surgeon: Theodoro Kos, DO;  Location: [facility];  Service: Plastics;  Laterality: Left;    History  Smoking Status  . Never Smoker  Smokeless Tobacco  . Never Used    History  Alcohol Use  . Yes    Family History  Problem Relation Age of Onset  . Stroke Mother   . Cancer Father        prostate    Review of Systems: As noted in history of present illness.  All other systems were reviewed and are negative.  Physical Exam: BP 130/78   Pulse 81   Ht 5\' 5"  (1.651 m)   Wt 197 lb 3.2 oz (89.4 kg)   BMI 32.82 kg/m  WDWF in NAD. The HEENT exam is normal.  The carotids are 2+ without bruits.  There is no thyromegaly.  There is no JVD.  The lungs are clear.  She has had left breast reconstruction. The heart exam reveals a regular rate with a normal S1 and S2.  There are no murmurs, gallops, or rubs.  The PMI is not displaced.   Abdominal exam reveals good bowel sounds.  There is no hepatosplenomegaly or tenderness.  There are no masses.  Exam of  the legs reveal no clubbing, cyanosis, or edema.  The legs are without rashes.  The distal pulses are intact.  Cranial nerves II - XII are intact.  Motor and sensory functions are intact.  The gait is normal.  LABORATORY DATA:  Lab Results  Component Value Date   WBC 3.3 (L) 09/01/2016   HGB 14.2 09/01/2016   HCT 43.2 09/01/2016   PLT 56 (L) 09/01/2016   GLUCOSE 81 08/14/2015   ALT 14 08/14/2015   AST 19 08/14/2015   NA 143 08/14/2015   K 3.7 08/14/2015   CL 109 (H) 08/19/2012   CREATININE 0.8 08/14/2015   BUN 9.8 08/14/2015   CO2 27 08/14/2015   INR 1.00 10/17/2014    Ecg today: NSR,  rate 81. nonspecific ST abnormality. I have personally reviewed and interpreted this study.   Assessment / Plan: 1. Supraventricular tachycardia. Well controlled on metoprolol. Only one sustained episode in last 3 years. We will continue on her current medication for now. Continue to avoid stimulants. Follow up in one year.  2. Breast cancer status post mastectomy. s/p chemotherapy with Herceptin. Echocardiogram showed normal LV function.   3. Myelodysplasia syndrome. Low grade with thrombocytopenia. Followed by oncology.  4. Sjogren's syndrome  5. Antiphospholipid syndrome. Follow by Rheumatology.

## 2016-09-21 ENCOUNTER — Ambulatory Visit (INDEPENDENT_AMBULATORY_CARE_PROVIDER_SITE_OTHER): Payer: Medicare Other | Admitting: Cardiology

## 2016-09-21 ENCOUNTER — Encounter: Payer: Self-pay | Admitting: Cardiology

## 2016-09-21 VITALS — BP 130/78 | HR 81 | Ht 65.0 in | Wt 197.2 lb

## 2016-09-21 DIAGNOSIS — I471 Supraventricular tachycardia: Secondary | ICD-10-CM

## 2016-09-21 MED ORDER — METOPROLOL SUCCINATE ER 100 MG PO TB24: 100.0000 mg | ORAL_TABLET | Freq: Every day | ORAL | 3 refills | Status: DC

## 2016-09-21 NOTE — Patient Instructions (Signed)
Continue your current therapy  I will see you in one year   

## 2016-09-21 NOTE — Addendum Note (Signed)
Addended by: Zebedee Iba on: 09/21/2016 04:32 PM   Modules accepted: Orders

## 2016-10-13 ENCOUNTER — Other Ambulatory Visit: Payer: Self-pay | Admitting: Cardiology

## 2016-10-13 DIAGNOSIS — I471 Supraventricular tachycardia: Secondary | ICD-10-CM

## 2016-10-21 ENCOUNTER — Other Ambulatory Visit: Payer: Self-pay

## 2016-10-21 MED ORDER — DOXYCYCLINE HYCLATE 100 MG PO TABS: 100.0000 mg | ORAL_TABLET | Freq: Two times a day (BID) | ORAL | 0 refills | Status: DC

## 2016-10-30 DIAGNOSIS — K1379 Other lesions of oral mucosa: Secondary | ICD-10-CM | POA: Diagnosis not present

## 2016-11-08 DIAGNOSIS — N390 Urinary tract infection, site not specified: Secondary | ICD-10-CM | POA: Diagnosis not present

## 2016-12-07 ENCOUNTER — Other Ambulatory Visit: Payer: Self-pay

## 2016-12-07 DIAGNOSIS — C50212 Malignant neoplasm of upper-inner quadrant of left female breast: Secondary | ICD-10-CM

## 2016-12-07 DIAGNOSIS — Z170 Estrogen receptor positive status [ER+]: Principal | ICD-10-CM

## 2016-12-07 NOTE — Progress Notes (Signed)
Pt called in feeling very anxious and nervous due to newly found lump around her R breast. Pt states that she was due for her annual mammogram in December 2018 but would like to see if she can schedule her mammogram sooner. Pt felt the same type of lump, similar to her tumor on her left breast cancer in the past. Discussed with Md and okay to call in for diagnostic mm to check out pt breast lump. Told pt that she may call [facility] to schedule next available appt time. Pt verbalized understanding and has no further questions or concerns at this time.

## 2016-12-10 ENCOUNTER — Ambulatory Visit
Admission: RE | Admit: 2016-12-10 | Discharge: 2016-12-10 | Disposition: A | Discharge status: Home / Self Care | Payer: Medicare Other | Source: Ambulatory Visit | Attending: Hematology and Oncology | Admitting: Hematology and Oncology

## 2016-12-10 ENCOUNTER — Other Ambulatory Visit: Payer: Self-pay | Admitting: Hematology and Oncology

## 2016-12-10 DIAGNOSIS — Z170 Estrogen receptor positive status [ER+]: Principal | ICD-10-CM

## 2016-12-10 DIAGNOSIS — C50212 Malignant neoplasm of upper-inner quadrant of left female breast: Secondary | ICD-10-CM

## 2016-12-10 DIAGNOSIS — N6489 Other specified disorders of breast: Secondary | ICD-10-CM | POA: Diagnosis not present

## 2016-12-10 DIAGNOSIS — R922 Inconclusive mammogram: Secondary | ICD-10-CM | POA: Diagnosis not present

## 2017-01-20 DIAGNOSIS — Z23 Encounter for immunization: Secondary | ICD-10-CM | POA: Diagnosis not present

## 2017-02-16 DIAGNOSIS — Z01419 Encounter for gynecological examination (general) (routine) without abnormal findings: Secondary | ICD-10-CM | POA: Diagnosis not present

## 2017-02-16 DIAGNOSIS — Z6833 Body mass index (BMI) 33.0-33.9, adult: Secondary | ICD-10-CM | POA: Diagnosis not present

## 2017-02-16 DIAGNOSIS — N8189 Other female genital prolapse: Secondary | ICD-10-CM | POA: Diagnosis not present

## 2017-03-04 ENCOUNTER — Telehealth: Payer: Self-pay

## 2017-03-04 ENCOUNTER — Encounter: Payer: Self-pay | Admitting: Adult Health

## 2017-03-04 ENCOUNTER — Ambulatory Visit (HOSPITAL_BASED_OUTPATIENT_CLINIC_OR_DEPARTMENT_OTHER): Payer: Medicare Other | Admitting: Adult Health

## 2017-03-04 ENCOUNTER — Ambulatory Visit (HOSPITAL_BASED_OUTPATIENT_CLINIC_OR_DEPARTMENT_OTHER): Payer: Medicare Other

## 2017-03-04 ENCOUNTER — Telehealth: Payer: Self-pay | Admitting: Adult Health

## 2017-03-04 VITALS — BP 144/83 | HR 75 | Temp 98.4°F | Resp 20 | Wt 200.6 lb

## 2017-03-04 DIAGNOSIS — C50212 Malignant neoplasm of upper-inner quadrant of left female breast: Secondary | ICD-10-CM | POA: Diagnosis not present

## 2017-03-04 DIAGNOSIS — Z170 Estrogen receptor positive status [ER+]: Secondary | ICD-10-CM | POA: Diagnosis not present

## 2017-03-04 DIAGNOSIS — D4620 Refractory anemia with excess of blasts, unspecified: Secondary | ICD-10-CM

## 2017-03-04 DIAGNOSIS — Z79811 Long term (current) use of aromatase inhibitors: Secondary | ICD-10-CM | POA: Diagnosis not present

## 2017-03-04 LAB — CBC WITH DIFFERENTIAL/PLATELET
BASO%: 0.3 % (ref 0.0–2.0)
BASOS ABS: 0 10*3/uL (ref 0.0–0.1)
EOS%: 1 % (ref 0.0–7.0)
Eosinophils Absolute: 0 10*3/uL (ref 0.0–0.5)
HCT: 42.2 % (ref 34.8–46.6)
HEMOGLOBIN: 13.9 g/dL (ref 11.6–15.9)
LYMPH%: 30.5 % (ref 14.0–49.7)
MCH: 32.6 pg (ref 25.1–34.0)
MCHC: 32.9 g/dL (ref 31.5–36.0)
MCV: 99.1 fL (ref 79.5–101.0)
MONO#: 0.2 10*3/uL (ref 0.1–0.9)
MONO%: 7.1 % (ref 0.0–14.0)
NEUT#: 1.9 10*3/uL (ref 1.5–6.5)
NEUT%: 61.1 % (ref 38.4–76.8)
Platelets: 48 10*3/uL — ABNORMAL LOW (ref 145–400)
RBC: 4.26 10*6/uL (ref 3.70–5.45)
RDW: 14.3 % (ref 11.2–14.5)
WBC: 3.1 10*3/uL — ABNORMAL LOW (ref 3.9–10.3)
lymph#: 1 10*3/uL (ref 0.9–3.3)
nRBC: 0 % (ref 0–0)

## 2017-03-04 LAB — COMPREHENSIVE METABOLIC PANEL
ALBUMIN: 3.9 g/dL (ref 3.5–5.0)
ALT: 19 U/L (ref 0–55)
ANION GAP: 7 meq/L (ref 3–11)
AST: 19 U/L (ref 5–34)
Alkaline Phosphatase: 76 U/L (ref 40–150)
BUN: 8.2 mg/dL (ref 7.0–26.0)
CALCIUM: 9 mg/dL (ref 8.4–10.4)
CHLORIDE: 108 meq/L (ref 98–109)
CO2: 30 mEq/L — ABNORMAL HIGH (ref 22–29)
Creatinine: 0.8 mg/dL (ref 0.6–1.1)
Glucose: 92 mg/dl (ref 70–140)
POTASSIUM: 4.5 meq/L (ref 3.5–5.1)
Sodium: 144 mEq/L (ref 136–145)
Total Bilirubin: 0.54 mg/dL (ref 0.20–1.20)
Total Protein: 7.2 g/dL (ref 6.4–8.3)

## 2017-03-04 NOTE — Telephone Encounter (Signed)
Scheduled appt per 1/3 los - patient did not want avs or calendar.

## 2017-03-04 NOTE — Telephone Encounter (Signed)
-----   Message from Gardenia Phlegm, NP sent at 03/04/2017 12:41 PM EST ----- Please mail patient her cbc and cmet results ----- Message ----- From: Interface, Lab In [place] Sent: 03/04/2017  12:25 PM To: Gardenia Phlegm, NP

## 2017-03-04 NOTE — Progress Notes (Signed)
CLINIC:  Survivorship   REASON FOR VISIT:  Routine follow-up for history of breast cancer.   BRIEF ONCOLOGIC HISTORY:    Breast cancer of upper-inner quadrant of left female breast ([place])   12/31/2011 - 03/11/2012 Neo-Adjuvant Chemotherapy    Taxotere Herceptin Perjeta 4 followed by Herceptin maintenance completed 01/29/2013      04/06/2012 Surgery    Left breast mastectomy: IDC grade 1; 0.8 cm, low-grade DCIS, LV I identified, 1/2 lymph node Micro met, ER/PR positive HER-2 positive Ki-67 94%      05/04/2012 - 06/20/2012 Radiation Therapy    Adjuvant radiation therapy      07/29/2012 -  Anti-estrogen oral therapy    Arimidex 1 mg daily      10/17/2014 Procedure    Bone marrow biopsy for thrombocytopenia and leukopenia: Hypercellular marrow with trilineage dysplasia most apparent in megakaryocytes with associated myelofibrosis: low-grade MDS with refractory cytopenias and multilineage dysplasia (20q- partial del)       MDS (myelodysplastic syndrome), low grade ([place])   10/17/2014 Initial Diagnosis    MDS (myelodysplastic syndrome), low grade , refractory cytopenia with multi nature dysplasia cytogenetics -20q (associated with good prognosis MDS)        INTERVAL HISTORY:  Ms. Trammel presents to the Survivorship Clinic today for routine follow-up for her history of breast cancer.  Overall, she reports feeling quite well. She tells me that she had to undergo mammo and ultrasound for ? Mass on right breast, but it ended up to do with the implant.  She has MDS, and hasn't been taking Aspirin with her platelets in the 40s.  She really would like for her blood counts to be checked every 3 months.  She continues to take Arimidex daily.  She is coming up on her five years.  She would to take the Arimidex for 7 years.  She had a bone density in 2018 and was stable    REVIEW OF SYSTEMS:  Review of Systems  Constitutional: Negative for appetite change, chills, fatigue, fever and unexpected  weight change.  HENT:   Negative for hearing loss, lump/mass and trouble swallowing.   Eyes: Negative for eye problems and icterus.  Respiratory: Negative for chest tightness, cough and shortness of breath.   Cardiovascular: Negative for chest pain, leg swelling and palpitations.  Gastrointestinal: Negative for abdominal distention, abdominal pain, constipation, diarrhea, nausea and vomiting.  Endocrine: Negative for hot flashes.  Genitourinary: Negative for difficulty urinating.   Musculoskeletal: Negative for arthralgias and gait problem.  Skin: Negative for itching.  Neurological: Negative for dizziness, extremity weakness, gait problem, headaches and numbness.  Hematological: Negative for adenopathy. Does not bruise/bleed easily.  Psychiatric/Behavioral: Negative for depression. The patient is not nervous/anxious.    Breast: Denies any new nodularity, masses, tenderness, nipple changes, or nipple discharge.       PAST MEDICAL/SURGICAL HISTORY:  Past Medical History:  Diagnosis Date  . Anemia   . Antiphospholipid antibody syndrome ([place])   . Breast cancer ([place]) 12/04/11   left- inv ductal ca, DCIS, ER/PR +, HER 2 +  . History of chemotherapy 12/31/11 -03/11/12   s/p 4 cycles  . History of radiation therapy 05/04/12-06/20/12   left breast/  . Myelodysplasia   . Neuropathy   . PONV (postoperative nausea and vomiting)   . SVT (supraventricular tachycardia) ([facility])    Past Surgical History:  Procedure Laterality Date  . AUGMENTATION MAMMAPLASTY Bilateral    2014 with reduction on right  . BREAST RECONSTRUCTION WITH PLACEMENT  OF TISSUE EXPANDER AND FLEX HD (ACELLULAR HYDRATED DERMIS)  04/06/2012   Procedure: BREAST RECONSTRUCTION WITH PLACEMENT OF TISSUE EXPANDER AND FLEX HD (ACELLULAR HYDRATED DERMIS);  Surgeon: Theodoro Kos, DO;  Location: [facility];  Service: Plastics;  Laterality: Left;  IMMEDIATE LEFT BREAST RECONSTRUCTION WITH PLACEMENT OF TISSUE EXPANDER AND  ALLODERM  . COLONOSCOPY    . DILATION AND CURETTAGE OF UTERUS    . EYE SURGERY     age 5-lt eye growth  . LIPOSUCTION WITH LIPOFILLING N/A 01/19/2013   Procedure: LIPOSUCTION WITH LIPOFILLING;  Surgeon: Theodoro Kos, DO;  Location: [facility];  Service: Plastics;  Laterality: N/A;  . MASTECTOMY Left    2014  . MASTECTOMY W/ SENTINEL NODE BIOPSY  04/06/2012   Procedure: MASTECTOMY WITH SENTINEL LYMPH NODE BIOPSY;  Surgeon: Rolm Bookbinder, MD;  Location: [facility];  Service: General;  Laterality: Left;  left mastectomy, left axillary sentinel node biopsy  . MASTOPEXY Right 01/19/2013   Procedure: RIGHT BREAST PLACEMENT OF IMPLANT WITH MASTOPEXY;  Surgeon: Theodoro Kos, DO;  Location: [facility];  Service: Plastics;  Laterality: Right;  . PORT-A-CATH REMOVAL Right 01/19/2013   Procedure: REMOVAL PORT-A-CATH;  Surgeon: Rolm Bookbinder, MD;  Location: [facility];  Service: General;  Laterality: Right;  . PORTACATH PLACEMENT  12/23/2011   Procedure: INSERTION PORT-A-CATH;  Surgeon: Rolm Bookbinder, MD;  Location: [facility];  Service: General;  Laterality: Right;  . REMOVAL OF TISSUE EXPANDER AND PLACEMENT OF IMPLANT Left 01/19/2013   Procedure: REMOVAL OF LEFT TISSUE EXPANDERS WITH PLACEMENT OF LEFT BREAST IMPLANT;  Surgeon: Theodoro Kos, DO;  Location: [place];  Service: Plastics;  Laterality: Left;     ALLERGIES:  Allergies  Allergen Reactions  . Cephalexin   . Cephalosporins     headache  . Codeine Nausea Only  . Tape Other (See Comments)    Sensitivity to bandaids and tape that cause blisters and redness     CURRENT MEDICATIONS:  Outpatient Encounter Medications as of 03/04/2017  Medication Sig  . anastrozole (ARIMIDEX) 1 MG tablet Take 1 tablet (1 mg total) by mouth daily.  . Calcium Carbonate-Vit D-Min (CALCIUM 1200 PO) Take 1 tablet by mouth every morning.  . cholecalciferol  (VITAMIN D) 1000 UNITS tablet Take by mouth daily.   . cyanocobalamin 100 MCG tablet Take 100 mcg by mouth daily.  Marland Kitchen doxycycline (VIBRA-TABS) 100 MG tablet Take 1 tablet (100 mg total) by mouth 2 (two) times daily.  . fluocinonide-emollient (LIDEX-E) 0.05 % cream Apply 1 application topically daily as needed (welps and rash.).   Marland Kitchen gabapentin (NEURONTIN) 600 MG tablet Take 600 mg by mouth 2 (two) times daily.  . metoprolol succinate (TOPROL-XL) 100 MG 24 hr tablet Take 1 tablet (100 mg total) by mouth daily. Take with or immediately following a meal.  . metoprolol succinate (TOPROL-XL) 100 MG 24 hr tablet TAKE 1 TABLET (100 MG TOTAL) BY MOUTH DAILY. TAKE WITH OR IMMEDIATELY FOLLOWING A MEAL.  . non-metallic deodorant Jethro Poling) MISC Apply 1 application topically daily as needed.  . pilocarpine (SALAGEN) 5 MG tablet Take 5 mg by mouth daily.  . Pseudoephedrine-APAP-DM (DAYQUIL MULTI-SYMPTOM PO) Take 1 tablet by mouth daily as needed (sinus.).  Marland Kitchen vitamin C (ASCORBIC ACID) 500 MG tablet Take 500 mg by mouth every morning.   . vitamin E 200 UNIT capsule Take 200 Units by mouth every morning.   . zolpidem (AMBIEN CR) 12.5 MG CR  tablet Take 1 tablet (12.5 mg total) by mouth at bedtime as needed for sleep.   No facility-administered encounter medications on file as of 03/04/2017.      ONCOLOGIC FAMILY HISTORY:  Family History  Problem Relation Age of Onset  . Stroke Mother   . Cancer Father        prostate    GENETIC COUNSELING/TESTING: Not at this time  SOCIAL HISTORY:  ALYS DULAK is in a committed relationship with her significant other Ronalee Belts and lives with him in [place], [place].  She has a daughter who lives in [place].  Her son lives in [place].  Ms. Muffley is currently retired.  She denies any current or history of tobacco, alcohol, or illicit drug use.     PHYSICAL EXAMINATION:  Vital Signs: Vitals:   03/04/17 1001  BP: (!) 144/83  Pulse: 75  Resp: 20  Temp:  98.4 F (36.9 C)  SpO2: 98%   Filed Weights   03/04/17 1001  Weight: 200 lb 9.6 oz (91 kg)   General: Well-nourished, well-appearing female in no acute distress.  Unaccompanied today.   HEENT: Head is normocephalic.  Pupils equal and reactive to light. Conjunctivae clear without exudate.  Sclerae anicteric. Oral mucosa is pink, moist.  Oropharynx is pink without lesions or erythema.  Lymph: No cervical, supraclavicular, or infraclavicular lymphadenopathy noted on palpation.  Cardiovascular: Regular rate and rhythm.Marland Kitchen Respiratory: Clear to auscultation bilaterally. Chest expansion symmetric; breathing non-labored.  Breast Exam:  -Left breast: s/p mastectomy, implant in place, no swelling or mass noted -Right breast: No appreciable masses on palpation. No skin redness, thickening, or peau d'orange appearance; no nipple retraction or nipple discharge; implant in place, no issues noted -Axilla: No axillary adenopathy bilaterally.  GI: Abdomen soft and round; non-tender, non-distended. Bowel sounds normoactive. No hepatosplenomegaly.   GU: Deferred.  Neuro: No focal deficits. Steady gait.  Psych: Mood and affect normal and appropriate for situation.  MSK: No focal spinal tenderness to palpation, full range of motion in bilateral upper extremities Extremities: No edema. Skin: Warm and dry.  LABORATORY DATA:  None for this visit, to be drawn afterward   DIAGNOSTIC IMAGING: 12/10/2016 Most recent mammogram:     ASSESSMENT AND PLAN:  Ms.. Castrellon is a pleasant 72 y.o. female with history of Stage IIA /left breast invasive ductal carcinoma, ER+/PR+/HER2+, diagnosed in 11/2011, treated with neoadjuvant chemotherapy, left mastectomy, lumpectomy, adjuvant radiation therapy, and anti-estrogen therapy with Anastrozole beginning in 06/2012.  She does have low grade MDS.  She presents to the Survivorship Clinic for surveillance and routine follow-up.   1. History of breast cancer:  Ms. Kittrell is  currently clinically and radiographically without evidence of disease or recurrence of breast cancer. She will be due for screening mammogram of the right breast in 12/10/2016; orders placed today.  S  I encouraged her to call me with any questions or concerns before her next visit at the cancer center, and I would be happy to see her sooner, if needed.    2. Low grade MDS: She has undergone eval with BMT group at [facility], she saw Dr. Laverta Baltimore.  Her labs have remained stable.  She would feel more comfortable monitoring them every 3 months, which we will do today.    3. Bone health:  Given Ms. Cahue's age, history of breast cancer, and her current anti-estrogen therapy with  Anastrozole, she is at risk for bone demineralization.  She has undergone bone density and cannot recall results.  She says it was stable.  She was given education on specific food and activities to promote bone health.  4. Cancer screening:  Due to Ms. Schuelke's history and her age, she should receive screening for skin cancers, colon cancer. She was encouraged to follow-up with her PCP for appropriate cancer screenings.   5. Health maintenance and wellness promotion: Ms. Groll was encouraged to consume 5-7 servings of fruits and vegetables per day. She was also encouraged to engage in moderate to vigorous exercise for 30 minutes per day most days of the week. She was instructed to limit her alcohol consumption and continue to abstain from tobacco use.    Dispo:  -Return to cancer center in 3 months for labs on and in 6 months for labs/appointment -Mammogram in 11/2016   A total of (30) minutes of face-to-face time was spent with this patient with greater than 50% of that time in counseling and care-coordination.   Gardenia Phlegm, NP Surviv[facility] [place] [phone]   Note: PRIMARY CARE PROVIDER [place] Cruel, [place] [phone]

## 2017-03-04 NOTE — Telephone Encounter (Signed)
Copy of labs put in mail today for patient.

## 2017-05-13 ENCOUNTER — Other Ambulatory Visit: Payer: Self-pay | Admitting: *Deleted

## 2017-05-13 DIAGNOSIS — D4620 Refractory anemia with excess of blasts, unspecified: Secondary | ICD-10-CM

## 2017-05-13 DIAGNOSIS — F5101 Primary insomnia: Secondary | ICD-10-CM

## 2017-05-13 NOTE — Telephone Encounter (Signed)
I'm fine with [place] refilling this.

## 2017-05-13 NOTE — Telephone Encounter (Signed)
Patient calling to have Ambien refilled by Dr. Lindi Adie. Medication refill pending. Please call patient back to pick up script.

## 2017-05-18 ENCOUNTER — Other Ambulatory Visit: Payer: Self-pay

## 2017-05-18 DIAGNOSIS — D4620 Refractory anemia with excess of blasts, unspecified: Secondary | ICD-10-CM

## 2017-05-18 DIAGNOSIS — F5101 Primary insomnia: Secondary | ICD-10-CM

## 2017-05-18 MED ORDER — ZOLPIDEM TARTRATE ER 12.5 MG PO TBCR: 12.5000 mg | EXTENDED_RELEASE_TABLET | Freq: Every evening | ORAL | 2 refills | Status: DC | PRN

## 2017-05-18 NOTE — Telephone Encounter (Signed)
Returned pt message. Will refill Ambien and fax into patients pharmacy.  Cyndia Bent RN

## 2017-06-11 ENCOUNTER — Other Ambulatory Visit: Payer: Self-pay | Admitting: Adult Health

## 2017-06-11 DIAGNOSIS — Z170 Estrogen receptor positive status [ER+]: Principal | ICD-10-CM

## 2017-06-11 DIAGNOSIS — C50212 Malignant neoplasm of upper-inner quadrant of left female breast: Secondary | ICD-10-CM

## 2017-06-14 ENCOUNTER — Inpatient Hospital Stay: Payer: Medicare Other | Attending: Hematology and Oncology

## 2017-06-14 ENCOUNTER — Telehealth: Payer: Self-pay

## 2017-06-14 DIAGNOSIS — C50212 Malignant neoplasm of upper-inner quadrant of left female breast: Secondary | ICD-10-CM | POA: Diagnosis not present

## 2017-06-14 DIAGNOSIS — Z170 Estrogen receptor positive status [ER+]: Secondary | ICD-10-CM

## 2017-06-14 LAB — CBC WITH DIFFERENTIAL (CANCER CENTER ONLY)
BASOS ABS: 0 10*3/uL (ref 0.0–0.1)
Basophils Relative: 1 %
EOS ABS: 0 10*3/uL (ref 0.0–0.5)
EOS PCT: 1 %
HCT: 44.3 % (ref 34.8–46.6)
Hemoglobin: 14.5 g/dL (ref 11.6–15.9)
Lymphocytes Relative: 32 %
Lymphs Abs: 1 10*3/uL (ref 0.9–3.3)
MCH: 32.4 pg (ref 25.1–34.0)
MCHC: 32.7 g/dL (ref 31.5–36.0)
MCV: 99.1 fL (ref 79.5–101.0)
MONO ABS: 0.3 10*3/uL (ref 0.1–0.9)
Monocytes Relative: 8 %
Neutro Abs: 1.9 10*3/uL (ref 1.5–6.5)
Neutrophils Relative %: 58 %
PLATELETS: 53 10*3/uL — AB (ref 145–400)
RBC: 4.47 MIL/uL (ref 3.70–5.45)
RDW: 14.1 % (ref 11.2–14.5)
WBC: 3.2 10*3/uL — AB (ref 3.9–10.3)

## 2017-06-14 LAB — CMP (CANCER CENTER ONLY)
ALK PHOS: 73 U/L (ref 40–150)
ALT: 17 U/L (ref 0–55)
AST: 19 U/L (ref 5–34)
Albumin: 3.8 g/dL (ref 3.5–5.0)
Anion gap: 7 (ref 3–11)
BUN: 14 mg/dL (ref 7–26)
CALCIUM: 9.2 mg/dL (ref 8.4–10.4)
CO2: 29 mmol/L (ref 22–29)
CREATININE: 0.85 mg/dL (ref 0.60–1.10)
Chloride: 107 mmol/L (ref 98–109)
GFR, Est AFR Am: >60 mL/min (ref >60)
GLUCOSE: 94 mg/dL (ref 70–140)
Potassium: 4.7 mmol/L (ref 3.5–5.1)
Sodium: 143 mmol/L (ref 136–145)
TOTAL PROTEIN: 7.2 g/dL (ref 6.4–8.3)
Total Bilirubin: 0.5 mg/dL (ref 0.2–1.2)

## 2017-06-14 NOTE — Telephone Encounter (Signed)
-----   Message from Gardenia Phlegm, NP sent at 06/14/2017 12:16 PM EDT -----  Please call patient and let her know that her platelets are stable ----- Message ----- From: Interface, Lab In [place] Sent: 06/14/2017  10:03 AM To: Gardenia Phlegm, NP

## 2017-06-14 NOTE — Telephone Encounter (Signed)
Spoke with patient in regards to lab results.  Patient already knew what they were because she waited to get a copy on Friday 4/15,  after she had her labs drawn.  Patient thanked nurse for call.  Understands to call center for any issues/concerns.

## 2017-06-24 DIAGNOSIS — H16141 Punctate keratitis, right eye: Secondary | ICD-10-CM | POA: Diagnosis not present

## 2017-06-24 DIAGNOSIS — H01009 Unspecified blepharitis unspecified eye, unspecified eyelid: Secondary | ICD-10-CM | POA: Diagnosis not present

## 2017-08-10 DIAGNOSIS — D225 Melanocytic nevi of trunk: Secondary | ICD-10-CM | POA: Diagnosis not present

## 2017-08-10 DIAGNOSIS — L821 Other seborrheic keratosis: Secondary | ICD-10-CM | POA: Diagnosis not present

## 2017-08-10 DIAGNOSIS — Z86018 Personal history of other benign neoplasm: Secondary | ICD-10-CM | POA: Diagnosis not present

## 2017-08-10 DIAGNOSIS — S30860A Insect bite (nonvenomous) of lower back and pelvis, initial encounter: Secondary | ICD-10-CM | POA: Diagnosis not present

## 2017-08-10 DIAGNOSIS — D2230 Melanocytic nevi of unspecified part of face: Secondary | ICD-10-CM | POA: Diagnosis not present

## 2017-08-10 DIAGNOSIS — D485 Neoplasm of uncertain behavior of skin: Secondary | ICD-10-CM | POA: Diagnosis not present

## 2017-08-10 DIAGNOSIS — W57XXXA Bitten or stung by nonvenomous insect and other nonvenomous arthropods, initial encounter: Secondary | ICD-10-CM | POA: Diagnosis not present

## 2017-08-10 DIAGNOSIS — D2272 Melanocytic nevi of left lower limb, including hip: Secondary | ICD-10-CM | POA: Diagnosis not present

## 2017-08-10 DIAGNOSIS — Z85828 Personal history of other malignant neoplasm of skin: Secondary | ICD-10-CM | POA: Diagnosis not present

## 2017-08-10 DIAGNOSIS — D2271 Melanocytic nevi of right lower limb, including hip: Secondary | ICD-10-CM | POA: Diagnosis not present

## 2017-09-08 ENCOUNTER — Other Ambulatory Visit: Payer: Self-pay | Admitting: Hematology and Oncology

## 2017-09-14 ENCOUNTER — Inpatient Hospital Stay (HOSPITAL_BASED_OUTPATIENT_CLINIC_OR_DEPARTMENT_OTHER): Payer: Medicare Other | Admitting: Adult Health

## 2017-09-14 ENCOUNTER — Other Ambulatory Visit: Payer: Self-pay | Admitting: Adult Health

## 2017-09-14 ENCOUNTER — Encounter: Payer: Self-pay | Admitting: Adult Health

## 2017-09-14 ENCOUNTER — Inpatient Hospital Stay: Payer: Medicare Other | Attending: Hematology and Oncology

## 2017-09-14 VITALS — BP 136/77 | HR 73 | Temp 98.5°F | Resp 18 | Ht 65.0 in | Wt 210.4 lb

## 2017-09-14 DIAGNOSIS — D469 Myelodysplastic syndrome, unspecified: Secondary | ICD-10-CM | POA: Diagnosis not present

## 2017-09-14 DIAGNOSIS — Z79811 Long term (current) use of aromatase inhibitors: Secondary | ICD-10-CM | POA: Diagnosis not present

## 2017-09-14 DIAGNOSIS — C50212 Malignant neoplasm of upper-inner quadrant of left female breast: Secondary | ICD-10-CM

## 2017-09-14 DIAGNOSIS — F5101 Primary insomnia: Secondary | ICD-10-CM

## 2017-09-14 DIAGNOSIS — D4620 Refractory anemia with excess of blasts, unspecified: Secondary | ICD-10-CM

## 2017-09-14 DIAGNOSIS — Z170 Estrogen receptor positive status [ER+]: Secondary | ICD-10-CM | POA: Insufficient documentation

## 2017-09-14 LAB — CMP (CANCER CENTER ONLY)
ALT: 23 U/L (ref 0–44)
AST: 20 U/L (ref 15–41)
Albumin: 4 g/dL (ref 3.5–5.0)
Alkaline Phosphatase: 76 U/L (ref 38–126)
Anion gap: 7 (ref 5–15)
BUN: 15 mg/dL (ref 8–23)
CALCIUM: 9.2 mg/dL (ref 8.9–10.3)
CHLORIDE: 107 mmol/L (ref 98–111)
CO2: 29 mmol/L (ref 22–32)
CREATININE: 0.8 mg/dL (ref 0.44–1.00)
GFR, Est AFR Am: >60 mL/min (ref >60)
GFR, Estimated: >60 mL/min (ref >60)
Glucose, Bld: 96 mg/dL (ref 70–99)
Potassium: 4 mmol/L (ref 3.5–5.1)
SODIUM: 143 mmol/L (ref 135–145)
Total Bilirubin: 0.5 mg/dL (ref 0.3–1.2)
Total Protein: 7.4 g/dL (ref 6.5–8.1)

## 2017-09-14 LAB — CBC WITH DIFFERENTIAL (CANCER CENTER ONLY)
BASOS PCT: 1 %
Basophils Absolute: 0 10*3/uL (ref 0.0–0.1)
EOS ABS: 0 10*3/uL (ref 0.0–0.5)
EOS PCT: 2 %
HCT: 41.2 % (ref 34.8–46.6)
Hemoglobin: 13.6 g/dL (ref 11.6–15.9)
LYMPHS ABS: 0.8 10*3/uL — AB (ref 0.9–3.3)
Lymphocytes Relative: 27 %
MCH: 32.3 pg (ref 25.1–34.0)
MCHC: 33.1 g/dL (ref 31.5–36.0)
MCV: 97.5 fL (ref 79.5–101.0)
MONOS PCT: 9 %
Monocytes Absolute: 0.3 10*3/uL (ref 0.1–0.9)
Neutro Abs: 1.9 10*3/uL (ref 1.5–6.5)
Neutrophils Relative %: 61 %
PLATELETS: 45 10*3/uL — AB (ref 145–400)
RBC: 4.22 MIL/uL (ref 3.70–5.45)
RDW: 15.1 % — ABNORMAL HIGH (ref 11.2–14.5)
WBC Count: 3 10*3/uL — ABNORMAL LOW (ref 3.9–10.3)

## 2017-09-14 MED ORDER — ZOLPIDEM TARTRATE ER 12.5 MG PO TBCR: 12.5000 mg | EXTENDED_RELEASE_TABLET | Freq: Every evening | ORAL | 2 refills | Status: DC | PRN

## 2017-09-14 NOTE — Assessment & Plan Note (Signed)
MDS (myelodysplastic syndrome), low grade Thrombocytopenia and leukopenia: Bone marrow biopsy 10/17/2014 suggestive of low-grade MDS refractory cytopenia with multilineage dysplasia cytogenetics revealed 20 q- suggestive of good prognosis MDS, also associated with myelofibrosis, no increase in blasts, flow cytometry normal  Lab review: CBC remains stable and we continue to monitor every 3 months.

## 2017-09-14 NOTE — Progress Notes (Signed)
[facility] Follow up:    Marie Cruel, MD [place] [address]   DIAGNOSIS: Cancer Staging Breast cancer of upper-inner quadrant of left female breast [facility]) Staging form: Breast, AJCC 7th Edition - Clinical stage from 12/16/2011: Stage IIA (T2, N0, cM0) - Unsigned - Pathologic: No stage assigned - Unsigned   SUMMARY OF ONCOLOGIC HISTORY:   Breast cancer of upper-inner quadrant of left female breast ([place])   12/31/2011 - 03/11/2012 Neo-Adjuvant Chemotherapy    Taxotere Herceptin Perjeta 4 followed by Herceptin maintenance completed 01/29/2013      04/06/2012 Surgery    Left breast mastectomy: IDC grade 1; 0.8 cm, low-grade DCIS, LV I identified, 1/2 lymph node Micro met, ER/PR positive HER-2 positive Ki-67 94%      05/04/2012 - 06/20/2012 Radiation Therapy    Adjuvant radiation therapy      07/29/2012 -  Anti-estrogen oral therapy    Arimidex 1 mg daily      10/17/2014 Procedure    Bone marrow biopsy for thrombocytopenia and leukopenia: Hypercellular marrow with trilineage dysplasia most apparent in megakaryocytes with associated myelofibrosis: low-grade MDS with refractory cytopenias and multilineage dysplasia (20q- partial del)       MDS (myelodysplastic syndrome), low grade ([place])   10/17/2014 Initial Diagnosis    MDS (myelodysplastic syndrome), low grade , refractory cytopenia with multi nature dysplasia cytogenetics -20q (associated with good prognosis MDS)       CURRENT THERAPY: Anastrozole  INTERVAL HISTORY: Marie Anderson 72 y.o. female returns for evaluation of her h/o breast cancer and MDS.  She notes a weight gain and tells me that her activity level is down.  She is doing well otherwise.  She underwent mammogram in 11/2016.    She is due to undergo colon cancer screening in 11/2017, she went to see a dermatologist last month for her skin cancer screening.  She did have a mole removed.    Marie Anderson did undergo a molar  removal, this is interfering with her ability to chew and she is working closely with her dentist on this issue.     Patient Active Problem List   Diagnosis Date Noted  . Breast cancer of upper-inner quadrant of left female breast ([place]) 12/10/2011    Priority: High  . Insomnia 05/08/2015  . MDS (myelodysplastic syndrome), low grade ([place]) 11/07/2014  . History of chemotherapy   . Anemia   . SVT (supraventricular tachycardia) ([place]) 02/04/2011    is allergic to cephalexin; cephalosporins; codeine; and tape.  MEDICAL HISTORY: Past Medical History:  Diagnosis Date  . Anemia   . Antiphospholipid antibody syndrome ([place])   . Breast cancer ([place]) 12/04/11   left- inv ductal ca, DCIS, ER/PR +, HER 2 +  . History of chemotherapy 12/31/11 -03/11/12   s/p 4 cycles  . History of radiation therapy 05/04/12-06/20/12   left breast/  . Myelodysplasia   . Neuropathy   . PONV (postoperative nausea and vomiting)   . SVT (supraventricular tachycardia) ([place])     SURGICAL HISTORY: Past Surgical History:  Procedure Laterality Date  . AUGMENTATION MAMMAPLASTY Bilateral    2014 with reduction on right  . BREAST RECONSTRUCTION WITH PLACEMENT OF TISSUE EXPANDER AND FLEX HD (ACELLULAR HYDRATED DERMIS)  04/06/2012   Procedure: BREAST RECONSTRUCTION WITH PLACEMENT OF TISSUE EXPANDER AND FLEX HD (ACELLULAR HYDRATED DERMIS);  Surgeon: Theodoro Kos, DO;  Location: [place];  Service: Plastics;  Laterality: Left;  IMMEDIATE LEFT BREAST RECONSTRUCTION WITH  PLACEMENT OF TISSUE EXPANDER AND ALLODERM  . COLONOSCOPY    . DILATION AND CURETTAGE OF UTERUS    . EYE SURGERY     age 58-lt eye growth  . LIPOSUCTION WITH LIPOFILLING N/A 01/19/2013   Procedure: LIPOSUCTION WITH LIPOFILLING;  Surgeon: Theodoro Kos, DO;  Location: [place];  Service: Plastics;  Laterality: N/A;  . MASTECTOMY Left    2014  . MASTECTOMY W/ SENTINEL NODE BIOPSY  04/06/2012   Procedure: MASTECTOMY WITH SENTINEL  LYMPH NODE BIOPSY;  Surgeon: Rolm Bookbinder, MD;  Location: [place];  Service: General;  Laterality: Left;  left mastectomy, left axillary sentinel node biopsy  . MASTOPEXY Right 01/19/2013   Procedure: RIGHT BREAST PLACEMENT OF IMPLANT WITH MASTOPEXY;  Surgeon: Theodoro Kos, DO;  Location: [place];  Service: Plastics;  Laterality: Right;  . PORT-A-CATH REMOVAL Right 01/19/2013   Procedure: REMOVAL PORT-A-CATH;  Surgeon: Rolm Bookbinder, MD;  Location: [place];  Service: General;  Laterality: Right;  . PORTACATH PLACEMENT  12/23/2011   Procedure: INSERTION PORT-A-CATH;  Surgeon: Rolm Bookbinder, MD;  Location: [place];  Service: General;  Laterality: Right;  . REMOVAL OF TISSUE EXPANDER AND PLACEMENT OF IMPLANT Left 01/19/2013   Procedure: REMOVAL OF LEFT TISSUE EXPANDERS WITH PLACEMENT OF LEFT BREAST IMPLANT;  Surgeon: Theodoro Kos, DO;  Location: [place];  Service: Plastics;  Laterality: Left;    SOCIAL HISTORY: Social History   Socioeconomic History  . Marital status: Married    Spouse name: Not on file  . Number of children: 2  . Years of education: Not on file  . Highest education level: Not on file  Occupational History  . Occupation: Engineer, manufacturing systems    Comment: retired  . Occupation: front office     Comment: [employer]  Social Needs  . Financial resource strain: Not on file  . Food insecurity:    Worry: Not on file    Inability: Not on file  . Transportation needs:    Medical: Not on file    Non-medical: Not on file  Tobacco Use  . Smoking status: Never Smoker  . Smokeless tobacco: Never Used  Substance and Sexual Activity  . Alcohol use: Yes  . Drug use: No  . Sexual activity: Yes  Lifestyle  . Physical activity:    Days per week: Not on file    Minutes per session: Not on file  . Stress: Not on file  Relationships  . Social connections:    Talks on  phone: Not on file    Gets together: Not on file    Attends religious service: Not on file    Active member of club or organization: Not on file    Attends meetings of clubs or organizations: Not on file    Relationship status: Not on file  . Intimate partner violence:    Fear of current or ex partner: Not on file    Emotionally abused: Not on file    Physically abused: Not on file    Forced sexual activity: Not on file  Other Topics Concern  . Not on file  Social History Narrative  . Not on file    FAMILY HISTORY: Family History  Problem Relation Age of Onset  . Stroke Mother   . Cancer Father        prostate    Review of Systems  Constitutional: Negative for appetite change, chills, fatigue, fever  and unexpected weight change.  HENT:   Negative for hearing loss, lump/mass, sore throat and trouble swallowing.   Eyes: Negative for eye problems and icterus.  Respiratory: Negative for chest tightness, cough and shortness of breath.   Cardiovascular: Negative for chest pain, leg swelling and palpitations.  Gastrointestinal: Negative for abdominal distention, abdominal pain, constipation, diarrhea, nausea and vomiting.  Endocrine: Negative for hot flashes.  Skin: Negative for itching and rash.  Neurological: Negative for dizziness, extremity weakness, headaches and numbness.  Hematological: Negative for adenopathy. Does not bruise/bleed easily.  Psychiatric/Behavioral: Negative for depression. The patient is not nervous/anxious.       PHYSICAL EXAMINATION  ECOG PERFORMANCE STATUS: 1 - Symptomatic but completely ambulatory  Vitals:   09/14/17 1039  BP: 136/77  Pulse: 73  Resp: 18  Temp: 98.5 F (36.9 C)  SpO2: 98%    Physical Exam  Constitutional: She is oriented to person, place, and time. She appears well-developed and well-nourished.  HENT:  Head: Normocephalic and atraumatic.  Mouth/Throat: Oropharynx is clear and moist.  Eyes: Pupils are equal, round, and  reactive to light. No scleral icterus.  Neck: Neck supple.  Cardiovascular: Normal rate, regular rhythm and normal heart sounds.  Pulmonary/Chest: Effort normal and breath sounds normal.  Abdominal: Soft. Bowel sounds are normal. She exhibits no distension and no mass. There is no tenderness. There is no guarding.  Musculoskeletal: She exhibits no edema.  Lymphadenopathy:    She has no cervical adenopathy.  Neurological: She is alert and oriented to person, place, and time.  Skin: Skin is warm and dry. Capillary refill takes less than 2 seconds. No rash noted.  Psychiatric: She has a normal mood and affect.    LABORATORY DATA:  CBC    Component Value Date/Time   WBC 3.0 (L) 09/14/2017 1108   WBC 3.1 (L) 03/04/2017 1142   WBC 2.8 (L) 10/17/2014 0940   RBC 4.22 09/14/2017 1108   HGB 13.6 09/14/2017 1108   HGB 13.9 03/04/2017 1142   HCT 41.2 09/14/2017 1108   HCT 42.2 03/04/2017 1142   PLT 45 (L) 09/14/2017 1108   PLT 48 (L) 03/04/2017 1142   MCV 97.5 09/14/2017 1108   MCV 99.1 03/04/2017 1142   MCH 32.3 09/14/2017 1108   MCHC 33.1 09/14/2017 1108   RDW 15.1 (H) 09/14/2017 1108   RDW 14.3 03/04/2017 1142   LYMPHSABS 0.8 (L) 09/14/2017 1108   LYMPHSABS 1.0 03/04/2017 1142   MONOABS 0.3 09/14/2017 1108   MONOABS 0.2 03/04/2017 1142   EOSABS 0.0 09/14/2017 1108   EOSABS 0.0 03/04/2017 1142   BASOSABS 0.0 09/14/2017 1108   BASOSABS 0.0 03/04/2017 1142    CMP     Component Value Date/Time   NA 143 09/14/2017 1005   NA 144 03/04/2017 1142   K 4.0 09/14/2017 1005   K 4.5 03/04/2017 1142   CL 107 09/14/2017 1005   CL 109 (H) 08/19/2012 1021   CO2 29 09/14/2017 1005   CO2 30 (H) 03/04/2017 1142   GLUCOSE 96 09/14/2017 1005   GLUCOSE 92 03/04/2017 1142   GLUCOSE 91 08/19/2012 1021   BUN 15 09/14/2017 1005   BUN 8.2 03/04/2017 1142   CREATININE 0.80 09/14/2017 1005   CREATININE 0.8 03/04/2017 1142   CALCIUM 9.2 09/14/2017 1005   CALCIUM 9.0 03/04/2017 1142   PROT 7.4  09/14/2017 1005   PROT 7.2 03/04/2017 1142   ALBUMIN 4.0 09/14/2017 1005   ALBUMIN 3.9 03/04/2017 1142   AST 20  09/14/2017 1005   AST 19 03/04/2017 1142   ALT 23 09/14/2017 1005   ALT 19 03/04/2017 1142   ALKPHOS 76 09/14/2017 1005   ALKPHOS 76 03/04/2017 1142   BILITOT 0.5 09/14/2017 1005   BILITOT 0.54 03/04/2017 1142   GFRNONAA >60 09/14/2017 1005   GFRAA >60 09/14/2017 1005            ASSESSMENT and PLAN:   Breast cancer of upper-inner quadrant of left female breast Left breast invasive ductal carcinoma ER/PR positive HER-2 negative Ki-67 94% diagnosed October 2013 status post 4 cycles of [place] followed by left mastectomy which revealed a 0.8 cm grade 1 IDC, 1 sentinel node micrometastatic disease ER/PR 100% positive status post radiation therapy and completed adjuvant trastuzumab 01/12/2013 currently on Arimidex 1 mg daily from 07/29/2012  Marie Anderson is doing well.  She is tolerating treatment well and will continue on anastrozole.  Her cancer screenings are up to date.  I recommended healthy diet and exercise.    MDS (myelodysplastic syndrome), low grade MDS (myelodysplastic syndrome), low grade Thrombocytopenia and leukopenia: Bone marrow biopsy 10/17/2014 suggestive of low-grade MDS refractory cytopenia with multilineage dysplasia cytogenetics revealed 20 q- suggestive of good prognosis MDS, also associated with myelofibrosis, no increase in blasts, flow cytometry normal  Lab review: CBC remains stable and we continue to monitor every 3 months.      Orders Placed This Encounter  Procedures  . CBC with Differential (Cancer Center Only)    Standing Status:   Standing    Number of Occurrences:   52    Standing Expiration Date:   09/15/2019  . CMP ([facility] only)    Standing Status:   Standing    Number of Occurrences:   52    Standing Expiration Date:   09/15/2019    Laylia will return in 3 months for labs and in 6 months for lab and appointment.  All  questions were answered. The patient knows to call the clinic with any problems, questions or concerns. We can certainly see the patient much sooner if necessary.  A total of (30) minutes of face-to-face time was spent with this patient with greater than 50% of that time in counseling and care-coordination.  This note was electronically signed. Scot Dock, NP 09/14/2017

## 2017-09-14 NOTE — Assessment & Plan Note (Signed)
Left breast invasive ductal carcinoma ER/PR positive HER-2 negative Ki-67 94% diagnosed October 2013 status post 4 cycles of [place] followed by left mastectomy which revealed a 0.8 cm grade 1 IDC, 1 sentinel node micrometastatic disease ER/PR 100% positive status post radiation therapy and completed adjuvant trastuzumab 01/12/2013 currently on Arimidex 1 mg daily from 07/29/2012  Marie Anderson is doing well.  She is tolerating treatment well and will continue on anastrozole.  Her cancer screenings are up to date.  I recommended healthy diet and exercise.

## 2017-09-15 ENCOUNTER — Telehealth: Payer: Self-pay | Admitting: Adult Health

## 2017-09-15 NOTE — Telephone Encounter (Signed)
Per 7/16 los already done

## 2017-10-14 IMAGING — MG 2D DIGITAL DIAGNOSTIC UNILATERAL RIGHT MAMMOGRAM WITH IMPLANTS,
8 of 11 series · 8 of 23 positions shown · non-contrast
Comparison: Previous exam(s).

CLINICAL DATA: 71-year-old patient with history of left breast
cancer status post mastectomy. She has a history of right breast
reduction and right breast silicone implant placement. She had a
benign stereotactic biopsy of the right breast in March 2015.

She presents today for evaluation of a palpable mass in the right
breast.
EXAM:
2D DIGITAL DIAGNOSTIC RIGHT MAMMOGRAM WITH IMPLANTS, CAD AND ADJUNCT
TOMO
ULTRASOUND RIGHT BREAST
The patient has retroglandular implant. Standard and implant
displaced views were performed.

[R CC (1 of 2)]
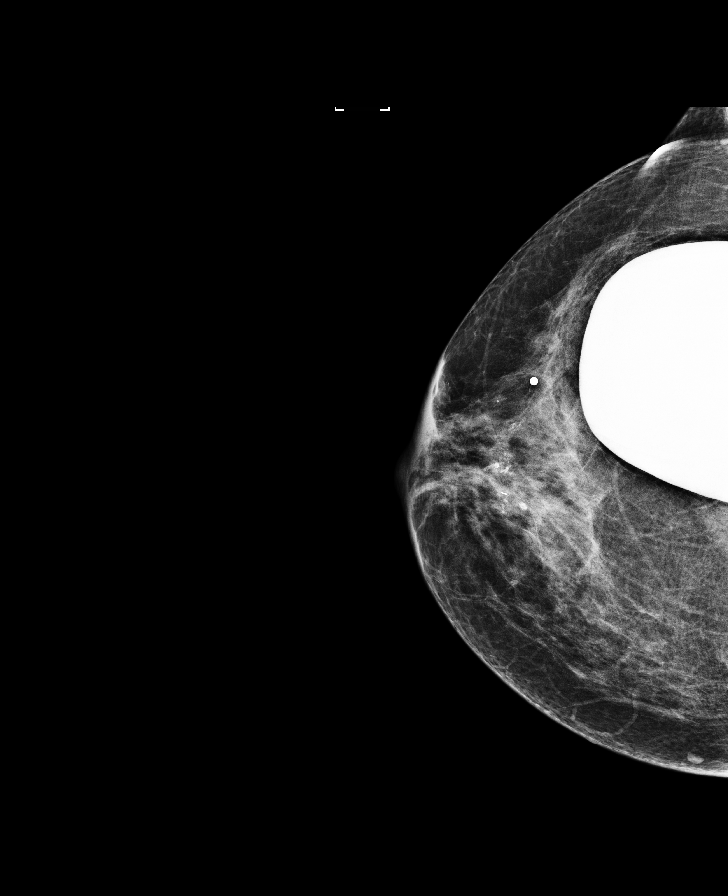

[R MLO (1 of 3)]
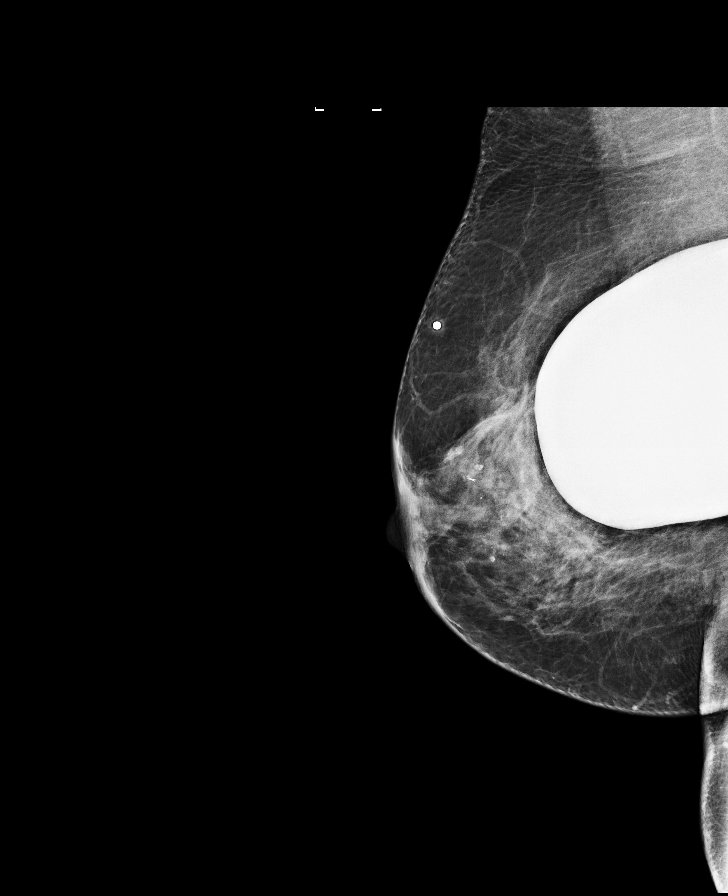

[R CC (2 of 2)]
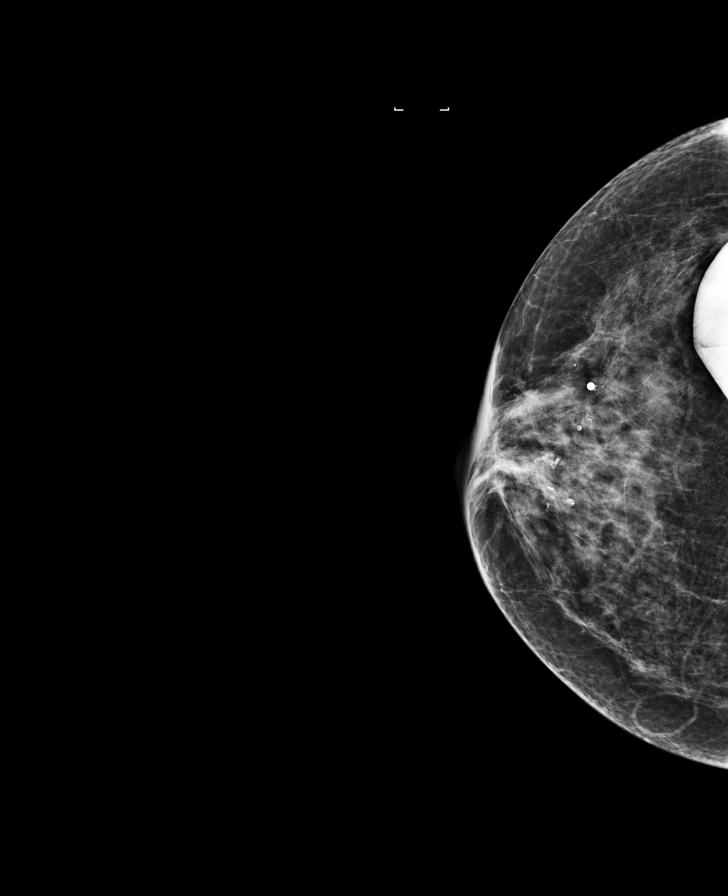

[R CC synth-2D]
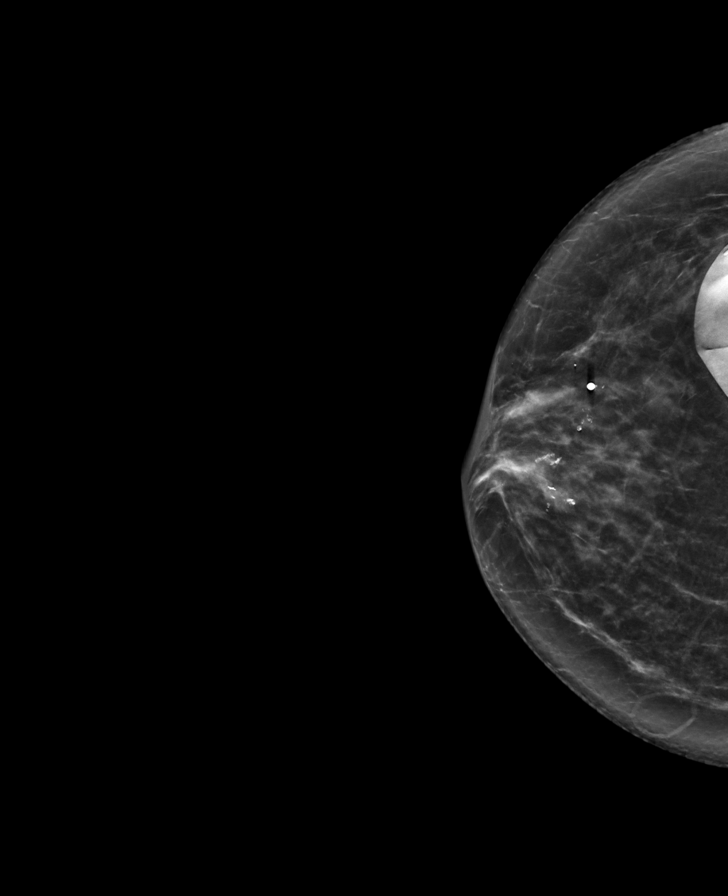

[R MLO synth-2D (1 of 2)]
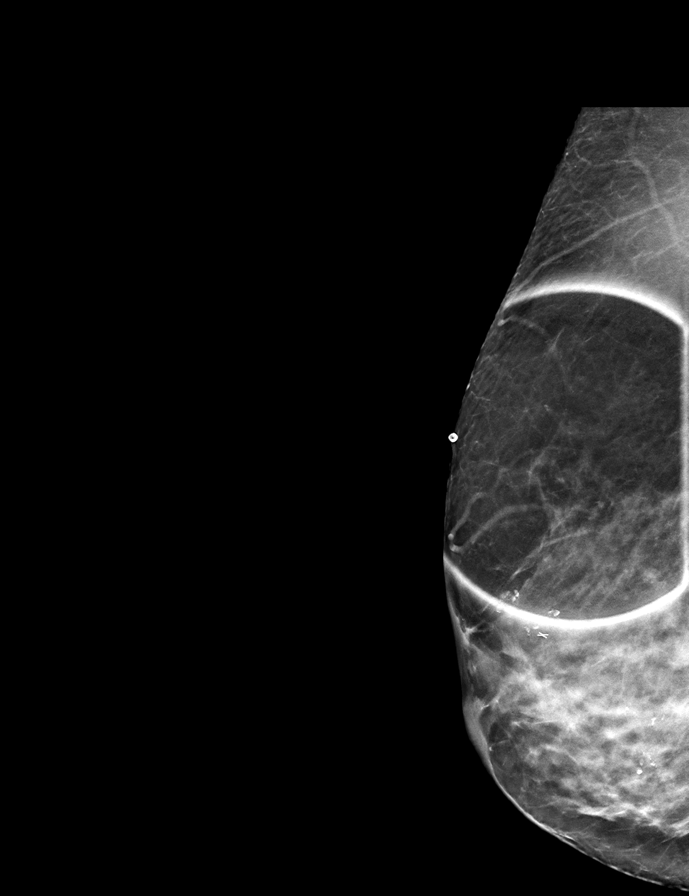

[R MLO (2 of 3)]
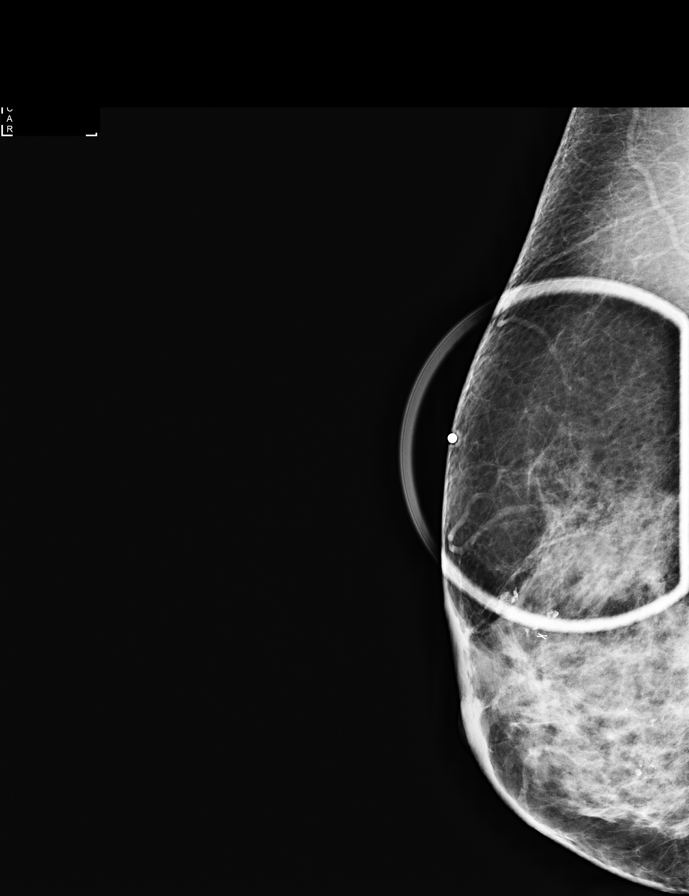

[R MLO synth-2D (2 of 2)]
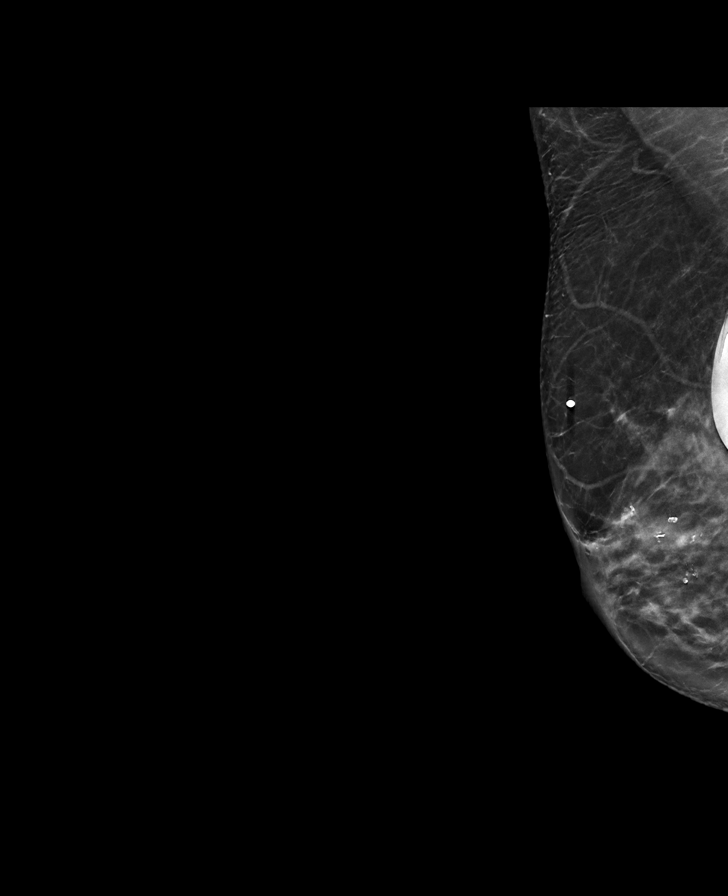

[R MLO (3 of 3)]
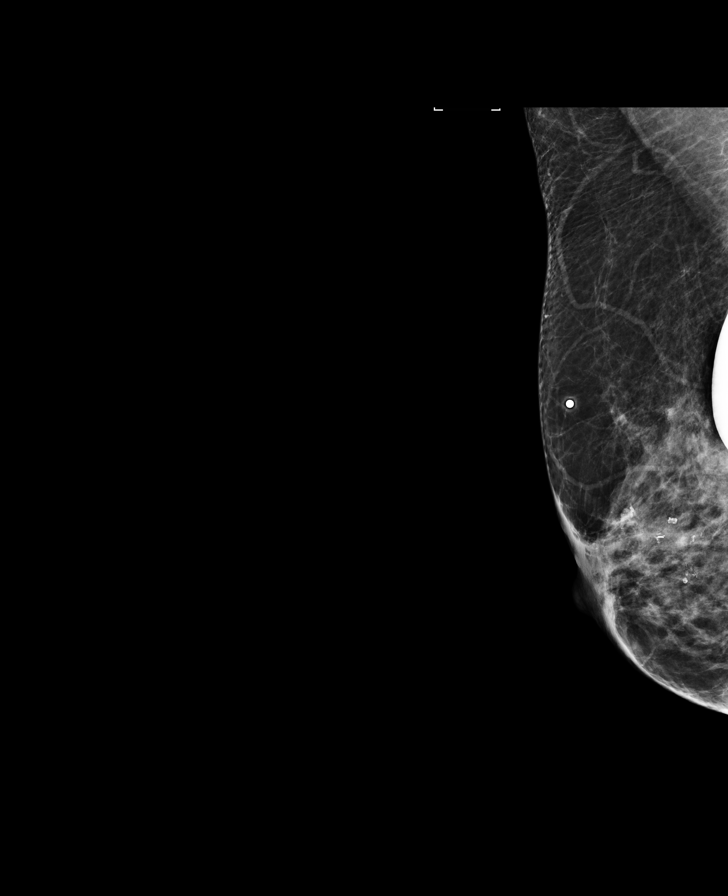

[8 of 23 positions shown; findings below may reference images not displayed]

ACR Breast Density Category c: The breast tissue is heterogeneously
dense, which may obscure small masses.
FINDINGS: Metallic skin marker was placed on the upper outer right breast in
the region of palpable concern. The patient's breast implant is
centered to the right of midline predominantly in the outer portion
of the right breast, and the skin marker directly overlies the
implant on the standard whole breast views. On spot compression view
of the region of patient concern, normal breast parenchyma is
imaged. There is no mass or distortion.

Appearance of the right breast stable mammographically. No mass,
architectural distortion, or suspicious microcalcification seen.
There are stable calcifications in the retroareolar right breast
which were previously biopsied, within the X shaped marker in place.

On physical exam, I palpate a smooth margin of the implant in the
upper slightly outer right breast in the region of patient concern.
I do not palpate a discrete breast mass.

Targeted ultrasound is performed, showing normal breast parenchyma
overlying the breast implant. No solid or cystic mass or abnormal
shadowing is identified.

Mammographic images were processed with CAD.
IMPRESSION: No evidence of malignancy in the right breast. The patient is likely
palpating her silicone breast implant.

RECOMMENDATION:
Annual screening mammography of the right breast is recommended in 1
year.

I have discussed the findings and recommendations with the patient.
Results were also provided in writing at the conclusion of the
visit. If applicable, a reminder letter will be sent to the patient
regarding the next appointment.

BI-RADS CATEGORY  2: Benign.

## 2017-12-14 ENCOUNTER — Inpatient Hospital Stay: Payer: Medicare Other | Attending: Hematology and Oncology

## 2017-12-14 DIAGNOSIS — Z170 Estrogen receptor positive status [ER+]: Secondary | ICD-10-CM

## 2017-12-14 DIAGNOSIS — C50212 Malignant neoplasm of upper-inner quadrant of left female breast: Secondary | ICD-10-CM | POA: Diagnosis not present

## 2017-12-14 LAB — CMP (CANCER CENTER ONLY)
ALT: 20 U/L (ref 0–44)
ANION GAP: 10 (ref 5–15)
AST: 23 U/L (ref 15–41)
Albumin: 3.8 g/dL (ref 3.5–5.0)
Alkaline Phosphatase: 73 U/L (ref 38–126)
BUN: 9 mg/dL (ref 8–23)
CALCIUM: 8.9 mg/dL (ref 8.9–10.3)
CHLORIDE: 107 mmol/L (ref 98–111)
CO2: 26 mmol/L (ref 22–32)
Creatinine: 0.83 mg/dL (ref 0.44–1.00)
GFR, Est AFR Am: >60 mL/min (ref >60)
Glucose, Bld: 99 mg/dL (ref 70–99)
Potassium: 4.2 mmol/L (ref 3.5–5.1)
SODIUM: 143 mmol/L (ref 135–145)
Total Bilirubin: 0.6 mg/dL (ref 0.3–1.2)
Total Protein: 7.3 g/dL (ref 6.5–8.1)

## 2017-12-14 LAB — CBC WITH DIFFERENTIAL (CANCER CENTER ONLY)
Abs Immature Granulocytes: 0.01 10*3/uL (ref 0.00–0.07)
BASOS ABS: 0 10*3/uL (ref 0.0–0.1)
Basophils Relative: 0 %
EOS ABS: 0 10*3/uL (ref 0.0–0.5)
Eosinophils Relative: 1 %
HCT: 41.8 % (ref 36.0–46.0)
Hemoglobin: 13.6 g/dL (ref 12.0–15.0)
IMMATURE GRANULOCYTES: 0 %
LYMPHS PCT: 31 %
Lymphs Abs: 0.9 10*3/uL (ref 0.7–4.0)
MCH: 31.9 pg (ref 26.0–34.0)
MCHC: 32.5 g/dL (ref 30.0–36.0)
MCV: 98.1 fL (ref 80.0–100.0)
Monocytes Absolute: 0.3 10*3/uL (ref 0.1–1.0)
Monocytes Relative: 9 %
NEUTROS PCT: 59 %
NRBC: 0 % (ref 0.0–0.2)
Neutro Abs: 1.7 10*3/uL (ref 1.7–7.7)
Platelet Count: 49 10*3/uL — ABNORMAL LOW (ref 150–400)
RBC: 4.26 MIL/uL (ref 3.87–5.11)
RDW: 13.7 % (ref 11.5–15.5)
WBC: 2.9 10*3/uL — AB (ref 4.0–10.5)

## 2017-12-17 NOTE — Progress Notes (Signed)
Marie Anderson Date of Birth: 1945-04-23 Medical Record #[id number]  History of Present Illness: Kaydence is seen for  followup SVT. She has a history of supraventricular tachycardia that has been well controlled with metoprolol. She also has a history of breast cancer. She had a left mastectomy with reconstruction. She was treated with Herceptin. Serial Echos have been normal. Previously she had an episode of SVT requiring Adenosine in November 2013. In July 2015 she had an episode that responded to Valsalva maneuver.   She was seen in the ED in [place] on July 31, 2016 with SVT rate 185. Converted with IV adenosine. Labs revealed plts 77K. Otherwise ok. Troponin negative.   Since does have a history of myelodysplasia syndrome. This has been felt to be low grade. Thrombocytopenia and leukopenia.  She also has been diagnosed with Sjogren's syndrome and antiphospholipid syndrome.    On follow up today she is feeling very well. She denies any recurrent SVT. No bleeding. No recurrent breast CA.    Current Outpatient Medications on File Prior to Visit  Medication Sig Dispense Refill  . anastrozole (ARIMIDEX) 1 MG tablet TAKE 1 TABLET BY MOUTH EVERY DAY 90 tablet 3  . Calcium Carbonate-Vit D-Min (CALCIUM 1200 PO) Take 1 tablet by mouth every morning.    . cholecalciferol (VITAMIN D) 1000 UNITS tablet Take by mouth daily.     . cyanocobalamin 100 MCG tablet Take 100 mcg by mouth daily.    Marland Kitchen doxycycline (VIBRA-TABS) 100 MG tablet Take 1 tablet (100 mg total) by mouth 2 (two) times daily. 14 tablet 0  . fluocinonide-emollient (LIDEX-E) 0.05 % cream Apply 1 application topically daily as needed (welps and rash.).     Marland Kitchen gabapentin (NEURONTIN) 600 MG tablet Take 600 mg by mouth 2 (two) times daily.    . non-metallic deodorant Jethro Poling) MISC Apply 1 application topically daily as needed.    . pilocarpine (SALAGEN) 5 MG tablet Take 5 mg by mouth daily.    . Pseudoephedrine-APAP-DM (DAYQUIL  MULTI-SYMPTOM PO) Take 1 tablet by mouth daily as needed (sinus.).    Marland Kitchen vitamin C (ASCORBIC ACID) 500 MG tablet Take 500 mg by mouth every morning.     . vitamin E 200 UNIT capsule Take 200 Units by mouth every morning.     . zolpidem (AMBIEN CR) 12.5 MG CR tablet Take 1 tablet (12.5 mg total) by mouth at bedtime as needed for sleep. 30 tablet 2   No current facility-administered medications on file prior to visit.     Allergies  Allergen Reactions  . Cephalexin   . Cephalosporins     headache  . Codeine Nausea Only  . Tape Other (See Comments)    Sensitivity to bandaids and tape that cause blisters and redness    Past Medical History:  Diagnosis Date  . Anemia   . Antiphospholipid antibody syndrome ([place])   . Breast cancer ([place]) 12/04/11   left- inv ductal ca, DCIS, ER/PR +, HER 2 +  . History of chemotherapy 12/31/11 -03/11/12   s/p 4 cycles  . History of radiation therapy 05/04/12-06/20/12   left breast/  . Myelodysplasia   . Neuropathy   . PONV (postoperative nausea and vomiting)   . SVT (supraventricular tachycardia) ([facility])     Past Surgical History:  Procedure Laterality Date  . AUGMENTATION MAMMAPLASTY Bilateral    2014 with reduction on right  . BREAST RECONSTRUCTION WITH PLACEMENT OF TISSUE EXPANDER AND FLEX HD (ACELLULAR HYDRATED DERMIS)  04/06/2012  Procedure: BREAST RECONSTRUCTION WITH PLACEMENT OF TISSUE EXPANDER AND FLEX HD (ACELLULAR HYDRATED DERMIS);  Surgeon: Theodoro Kos, DO;  Location: [facility];  Service: Plastics;  Laterality: Left;  IMMEDIATE LEFT BREAST RECONSTRUCTION WITH PLACEMENT OF TISSUE EXPANDER AND ALLODERM  . COLONOSCOPY    . DILATION AND CURETTAGE OF UTERUS    . EYE SURGERY     age 41-lt eye growth  . LIPOSUCTION WITH LIPOFILLING N/A 01/19/2013   Procedure: LIPOSUCTION WITH LIPOFILLING;  Surgeon: Theodoro Kos, DO;  Location: [facility];  Service: Plastics;  Laterality: N/A;  . MASTECTOMY Left    2014  . MASTECTOMY  W/ SENTINEL NODE BIOPSY  04/06/2012   Procedure: MASTECTOMY WITH SENTINEL LYMPH NODE BIOPSY;  Surgeon: Rolm Bookbinder, MD;  Location: [facility] [place];  Service: General;  Laterality: Left;  left mastectomy, left axillary sentinel node biopsy  . MASTOPEXY Right 01/19/2013   Procedure: RIGHT BREAST PLACEMENT OF IMPLANT WITH MASTOPEXY;  Surgeon: Theodoro Kos, DO;  Location: [facility];  Service: Plastics;  Laterality: Right;  . PORT-A-CATH REMOVAL Right 01/19/2013   Procedure: REMOVAL PORT-A-CATH;  Surgeon: Rolm Bookbinder, MD;  Location: [place];  Service: General;  Laterality: Right;  . PORTACATH PLACEMENT  12/23/2011   Procedure: INSERTION PORT-A-CATH;  Surgeon: Rolm Bookbinder, MD;  Location: [facility];  Service: General;  Laterality: Right;  . REMOVAL OF TISSUE EXPANDER AND PLACEMENT OF IMPLANT Left 01/19/2013   Procedure: REMOVAL OF LEFT TISSUE EXPANDERS WITH PLACEMENT OF LEFT BREAST IMPLANT;  Surgeon: Theodoro Kos, DO;  Location: [place];  Service: Plastics;  Laterality: Left;    Social History   Tobacco Use  Smoking Status Never Smoker  Smokeless Tobacco Never Used    Social History   Substance and Sexual Activity  Alcohol Use Yes    Family History  Problem Relation Age of Onset  . Stroke Mother   . Cancer Father        prostate    Review of Systems: As noted in history of present illness.  All other systems were reviewed and are negative.  Physical Exam: BP 130/86   Pulse 88   Ht 5\' 5"  (1.651 m)   Wt 208 lb (94.3 kg)   BMI 34.61 kg/m  GENERAL:  Well appearing, obese WF in NAD HEENT:  PERRL, EOMI, sclera are clear. Oropharynx is clear. NECK:  No jugular venous distention, carotid upstroke brisk and symmetric, no bruits, no thyromegaly or adenopathy LUNGS:  Clear to auscultation bilaterally CHEST:  Unremarkable HEART:  RRR,  PMI not displaced or sustained,S1 and S2 within normal  limits, no S3, no S4: no clicks, no rubs, no murmurs ABD:  Soft, nontender. BS +, no masses or bruits. No hepatomegaly, no splenomegaly EXT:  2 + pulses throughout, no edema, no cyanosis no clubbing SKIN:  Warm and dry.  No rashes NEURO:  Alert and oriented x 3. Cranial nerves II through XII intact. PSYCH:  Cognitively intact    LABORATORY DATA:  Lab Results  Component Value Date   WBC 2.9 (L) 12/14/2017   HGB 13.6 12/14/2017   HCT 41.8 12/14/2017   PLT 49 (L) 12/14/2017   GLUCOSE 99 12/14/2017   ALT 20 12/14/2017   AST 23 12/14/2017   NA 143 12/14/2017   K 4.2 12/14/2017   CL 107 12/14/2017   CREATININE 0.83 12/14/2017   BUN 9 12/14/2017   CO2 26 12/14/2017   INR 1.00 10/17/2014  Ecg today: NSR, rate 88. nonspecific ST abnormality. I have personally reviewed and interpreted this study.   Assessment / Plan: 1. Supraventricular tachycardia. Well controlled on metoprolol. We will continue on her current medication. Continue to avoid stimulants. Follow up in one year.  2. Breast cancer status post mastectomy. s/p chemotherapy with Herceptin. Echocardiogram showed normal LV function.   3. Myelodysplasia syndrome- with thrombocytopenia. Followed by oncology.  4. Sjogren's syndrome  5. Antiphospholipid syndrome. Follow by Rheumatology.

## 2017-12-20 ENCOUNTER — Ambulatory Visit (INDEPENDENT_AMBULATORY_CARE_PROVIDER_SITE_OTHER): Payer: Medicare Other | Admitting: Cardiology

## 2017-12-20 ENCOUNTER — Encounter: Payer: Self-pay | Admitting: Cardiology

## 2017-12-20 DIAGNOSIS — I471 Supraventricular tachycardia: Secondary | ICD-10-CM | POA: Diagnosis not present

## 2017-12-20 MED ORDER — METOPROLOL SUCCINATE ER 100 MG PO TB24: 100.0000 mg | ORAL_TABLET | Freq: Every day | ORAL | 3 refills | Status: DC

## 2018-01-20 DIAGNOSIS — G2581 Restless legs syndrome: Secondary | ICD-10-CM | POA: Diagnosis not present

## 2018-01-20 DIAGNOSIS — R202 Paresthesia of skin: Secondary | ICD-10-CM | POA: Diagnosis not present

## 2018-01-20 DIAGNOSIS — G509 Disorder of trigeminal nerve, unspecified: Secondary | ICD-10-CM | POA: Diagnosis not present

## 2018-01-20 DIAGNOSIS — R201 Hypoesthesia of skin: Secondary | ICD-10-CM | POA: Diagnosis not present

## 2018-01-20 DIAGNOSIS — M5417 Radiculopathy, lumbosacral region: Secondary | ICD-10-CM | POA: Diagnosis not present

## 2018-01-20 DIAGNOSIS — G5603 Carpal tunnel syndrome, bilateral upper limbs: Secondary | ICD-10-CM | POA: Diagnosis not present

## 2018-01-20 DIAGNOSIS — G603 Idiopathic progressive neuropathy: Secondary | ICD-10-CM | POA: Diagnosis not present

## 2018-02-02 DIAGNOSIS — Z23 Encounter for immunization: Secondary | ICD-10-CM | POA: Diagnosis not present

## 2018-02-02 DIAGNOSIS — R591 Generalized enlarged lymph nodes: Secondary | ICD-10-CM | POA: Diagnosis not present

## 2018-03-09 DIAGNOSIS — Z6835 Body mass index (BMI) 35.0-35.9, adult: Secondary | ICD-10-CM | POA: Diagnosis not present

## 2018-03-09 DIAGNOSIS — Z124 Encounter for screening for malignant neoplasm of cervix: Secondary | ICD-10-CM | POA: Diagnosis not present

## 2018-03-09 DIAGNOSIS — Z1231 Encounter for screening mammogram for malignant neoplasm of breast: Secondary | ICD-10-CM | POA: Diagnosis not present

## 2018-03-16 ENCOUNTER — Inpatient Hospital Stay: Payer: Medicare Other | Attending: Hematology and Oncology

## 2018-03-16 ENCOUNTER — Telehealth: Payer: Self-pay | Admitting: Adult Health

## 2018-03-16 ENCOUNTER — Inpatient Hospital Stay (HOSPITAL_BASED_OUTPATIENT_CLINIC_OR_DEPARTMENT_OTHER): Payer: Medicare Other | Admitting: Adult Health

## 2018-03-16 ENCOUNTER — Encounter: Payer: Self-pay | Admitting: Adult Health

## 2018-03-16 ENCOUNTER — Ambulatory Visit (HOSPITAL_COMMUNITY)
Admission: RE | Admit: 2018-03-16 | Discharge: 2018-03-16 | Disposition: A | Discharge status: Home / Self Care | Payer: Medicare Other | Source: Ambulatory Visit | Attending: Adult Health | Admitting: Adult Health

## 2018-03-16 VITALS — BP 125/59 | HR 84 | Temp 99.0°F | Resp 18 | Ht 65.0 in | Wt 211.6 lb

## 2018-03-16 DIAGNOSIS — Z9221 Personal history of antineoplastic chemotherapy: Secondary | ICD-10-CM | POA: Insufficient documentation

## 2018-03-16 DIAGNOSIS — Z923 Personal history of irradiation: Secondary | ICD-10-CM | POA: Insufficient documentation

## 2018-03-16 DIAGNOSIS — D469 Myelodysplastic syndrome, unspecified: Secondary | ICD-10-CM

## 2018-03-16 DIAGNOSIS — Z9012 Acquired absence of left breast and nipple: Secondary | ICD-10-CM

## 2018-03-16 DIAGNOSIS — C50212 Malignant neoplasm of upper-inner quadrant of left female breast: Secondary | ICD-10-CM

## 2018-03-16 DIAGNOSIS — Z853 Personal history of malignant neoplasm of breast: Secondary | ICD-10-CM | POA: Diagnosis not present

## 2018-03-16 DIAGNOSIS — M25551 Pain in right hip: Secondary | ICD-10-CM | POA: Diagnosis not present

## 2018-03-16 DIAGNOSIS — D4620 Refractory anemia with excess of blasts, unspecified: Secondary | ICD-10-CM

## 2018-03-16 DIAGNOSIS — Z170 Estrogen receptor positive status [ER+]: Secondary | ICD-10-CM | POA: Diagnosis not present

## 2018-03-16 DIAGNOSIS — Z79899 Other long term (current) drug therapy: Secondary | ICD-10-CM | POA: Insufficient documentation

## 2018-03-16 DIAGNOSIS — Z79811 Long term (current) use of aromatase inhibitors: Secondary | ICD-10-CM | POA: Diagnosis not present

## 2018-03-16 LAB — CMP (CANCER CENTER ONLY)
ALT: 18 U/L (ref 0–44)
AST: 18 U/L (ref 15–41)
Albumin: 3.9 g/dL (ref 3.5–5.0)
Alkaline Phosphatase: 78 U/L (ref 38–126)
Anion gap: 7 (ref 5–15)
BUN: 10 mg/dL (ref 8–23)
CHLORIDE: 110 mmol/L (ref 98–111)
CO2: 26 mmol/L (ref 22–32)
Calcium: 8.5 mg/dL — ABNORMAL LOW (ref 8.9–10.3)
Creatinine: 0.74 mg/dL (ref 0.44–1.00)
GFR, Estimated: >60 mL/min (ref >60)
Glucose, Bld: 97 mg/dL (ref 70–99)
POTASSIUM: 3.9 mmol/L (ref 3.5–5.1)
SODIUM: 143 mmol/L (ref 135–145)
Total Bilirubin: 0.6 mg/dL (ref 0.3–1.2)
Total Protein: 7 g/dL (ref 6.5–8.1)

## 2018-03-16 LAB — CBC WITH DIFFERENTIAL (CANCER CENTER ONLY)
Abs Immature Granulocytes: 0.02 10*3/uL (ref 0.00–0.07)
BASOS ABS: 0 10*3/uL (ref 0.0–0.1)
BASOS PCT: 1 %
EOS ABS: 0 10*3/uL (ref 0.0–0.5)
Eosinophils Relative: 1 %
HCT: 43.2 % (ref 36.0–46.0)
Hemoglobin: 14 g/dL (ref 12.0–15.0)
IMMATURE GRANULOCYTES: 1 %
Lymphocytes Relative: 23 %
Lymphs Abs: 0.8 10*3/uL (ref 0.7–4.0)
MCH: 32 pg (ref 26.0–34.0)
MCHC: 32.4 g/dL (ref 30.0–36.0)
MCV: 98.9 fL (ref 80.0–100.0)
Monocytes Absolute: 0.3 10*3/uL (ref 0.1–1.0)
Monocytes Relative: 8 %
NRBC: 0 % (ref 0.0–0.2)
Neutro Abs: 2.3 10*3/uL (ref 1.7–7.7)
Neutrophils Relative %: 66 %
PLATELETS: 48 10*3/uL — AB (ref 150–400)
RBC: 4.37 MIL/uL (ref 3.87–5.11)
RDW: 14 % (ref 11.5–15.5)
WBC: 3.5 10*3/uL — AB (ref 4.0–10.5)

## 2018-03-16 NOTE — Assessment & Plan Note (Addendum)
Adrianah has no sign of recurrence of her breast cancer.  She is taking the Anastrozole daily and tolerating it well.  We discussed the possibility of doing breast MRI to evaluate her implants, and breasts at this point.  We discussed that typically plastic surgeon will manage this, however Asma would like for [place] to manage this.    She is up to date with bone density and mammogram and has these done with Dr. Radene Knee.

## 2018-03-16 NOTE — Progress Notes (Signed)
Marie Anderson:    Marie Cruel, MD [place] [address]   DIAGNOSIS: Cancer Staging Breast cancer of upper-inner quadrant of left female breast [facility]) Staging form: Breast, AJCC 7th Edition - Clinical stage from 12/16/2011: Stage IIA (T2, N0, cM0) - Unsigned - Pathologic: No stage assigned - Unsigned   SUMMARY OF ONCOLOGIC HISTORY:   Breast cancer of upper-inner quadrant of left female breast ([place])   12/31/2011 - 03/11/2012 Neo-Adjuvant Chemotherapy    Taxotere Herceptin Perjeta 4 followed by Herceptin maintenance completed 01/29/2013    04/06/2012 Surgery    Left breast mastectomy: IDC grade 1; 0.8 cm, low-grade DCIS, LV I identified, 1/2 lymph node Micro met, ER/PR positive HER-2 positive Ki-67 94%    05/04/2012 - 06/20/2012 Radiation Therapy    Adjuvant radiation therapy    07/29/2012 -  Anti-estrogen oral therapy    Arimidex 1 mg daily    10/17/2014 Procedure    Bone marrow biopsy for thrombocytopenia and leukopenia: Hypercellular marrow with trilineage dysplasia most apparent in megakaryocytes with associated myelofibrosis: low-grade MDS with refractory cytopenias and multilineage dysplasia (20q- partial del)     MDS (myelodysplastic syndrome), low grade ([place])   10/17/2014 Initial Diagnosis    MDS (myelodysplastic syndrome), low grade , refractory cytopenia with multi nature dysplasia cytogenetics -20q (associated with good prognosis MDS)     CURRENT THERAPY: Anastrozole  INTERVAL HISTORY: Marie Anderson 73 y.o. female returns for evaluation of her h/o breast cancer and MDS.  She is doing well today.  Her labs are stable.  Her WBC are close to normal today.  She wants to know about getting the Shingrix vaccine.  She also wants to know about when she will need an MRI for her breast implants.  She has annual mammograms and does this with her GYN. She has noted a right hip pain that has been progressively worsening over the  past few weeks and isn't sure what to do.   She denies any other issues today.         Patient Active Problem List   Diagnosis Date Noted  . Breast cancer of upper-inner quadrant of left female breast ([place]) 12/10/2011    Priority: High  . Insomnia 05/08/2015  . MDS (myelodysplastic syndrome), low grade ([place]) 11/07/2014  . History of chemotherapy   . Anemia   . SVT (supraventricular tachycardia) ([place]) 02/04/2011    is allergic to cephalexin; cephalosporins; codeine; and tape.  MEDICAL HISTORY: Past Medical History:  Diagnosis Date  . Anemia   . Antiphospholipid antibody syndrome ([place])   . Breast cancer ([place]) 12/04/11   left- inv ductal ca, DCIS, ER/PR +, HER 2 +  . History of chemotherapy 12/31/11 -03/11/12   s/p 4 cycles  . History of radiation therapy 05/04/12-06/20/12   left breast/  . Myelodysplasia   . Neuropathy   . PONV (postoperative nausea and vomiting)   . SVT (supraventricular tachycardia) ([place])     SURGICAL HISTORY: Past Surgical History:  Procedure Laterality Date  . AUGMENTATION MAMMAPLASTY Bilateral    2014 with reduction on right  . BREAST RECONSTRUCTION WITH PLACEMENT OF TISSUE EXPANDER AND FLEX HD (ACELLULAR HYDRATED DERMIS)  04/06/2012   Procedure: BREAST RECONSTRUCTION WITH PLACEMENT OF TISSUE EXPANDER AND FLEX HD (ACELLULAR HYDRATED DERMIS);  Surgeon: Theodoro Kos, DO;  Location: [place];  Service: Plastics;  Laterality: Left;  IMMEDIATE LEFT BREAST RECONSTRUCTION WITH PLACEMENT OF TISSUE EXPANDER AND ALLODERM  .  COLONOSCOPY    . DILATION AND CURETTAGE OF UTERUS    . EYE SURGERY     age 83-lt eye growth  . LIPOSUCTION WITH LIPOFILLING N/A 01/19/2013   Procedure: LIPOSUCTION WITH LIPOFILLING;  Surgeon: Theodoro Kos, DO;  Location: [place];  Service: Plastics;  Laterality: N/A;  . MASTECTOMY Left    2014  . MASTECTOMY W/ SENTINEL NODE BIOPSY  04/06/2012   Procedure: MASTECTOMY WITH SENTINEL LYMPH NODE BIOPSY;  Surgeon:  Rolm Bookbinder, MD;  Location: [place];  Service: General;  Laterality: Left;  left mastectomy, left axillary sentinel node biopsy  . MASTOPEXY Right 01/19/2013   Procedure: RIGHT BREAST PLACEMENT OF IMPLANT WITH MASTOPEXY;  Surgeon: Theodoro Kos, DO;  Location: [place];  Service: Plastics;  Laterality: Right;  . PORT-A-CATH REMOVAL Right 01/19/2013   Procedure: REMOVAL PORT-A-CATH;  Surgeon: Rolm Bookbinder, MD;  Location: [place];  Service: General;  Laterality: Right;  . PORTACATH PLACEMENT  12/23/2011   Procedure: INSERTION PORT-A-CATH;  Surgeon: Rolm Bookbinder, MD;  Location: [place];  Service: General;  Laterality: Right;  . REMOVAL OF TISSUE EXPANDER AND PLACEMENT OF IMPLANT Left 01/19/2013   Procedure: REMOVAL OF LEFT TISSUE EXPANDERS WITH PLACEMENT OF LEFT BREAST IMPLANT;  Surgeon: Theodoro Kos, DO;  Location: [place];  Service: Plastics;  Laterality: Left;    SOCIAL HISTORY: Social History   Socioeconomic History  . Marital status: Married    Spouse name: Not on file  . Number of children: 2  . Years of education: Not on file  . Highest education level: Not on file  Occupational History  . Occupation: Engineer, manufacturing systems    Comment: retired  . Occupation: front office     Comment: [employer]  Social Needs  . Financial resource strain: Not on file  . Food insecurity:    Worry: Not on file    Inability: Not on file  . Transportation needs:    Medical: Not on file    Non-medical: Not on file  Tobacco Use  . Smoking status: Never Smoker  . Smokeless tobacco: Never Used  Substance and Sexual Activity  . Alcohol use: Yes  . Drug use: No  . Sexual activity: Yes  Lifestyle  . Physical activity:    Days per week: Not on file    Minutes per session: Not on file  . Stress: Not on file  Relationships  . Social connections:    Talks on phone: Not on file    Gets  together: Not on file    Attends religious service: Not on file    Active member of club or organization: Not on file    Attends meetings of clubs or organizations: Not on file    Relationship status: Not on file  . Intimate partner violence:    Fear of current or ex partner: Not on file    Emotionally abused: Not on file    Physically abused: Not on file    Forced sexual activity: Not on file  Other Topics Concern  . Not on file  Social History Narrative  . Not on file    FAMILY HISTORY: Family History  Problem Relation Age of Onset  . Stroke Mother   . Cancer Father        prostate    Review of Systems  Constitutional: Negative for appetite change, chills, fatigue, fever and unexpected weight change.  HENT:  Negative for hearing loss, lump/mass, sore throat and trouble swallowing.   Eyes: Negative for eye problems and icterus.  Respiratory: Negative for chest tightness, cough and shortness of breath.   Cardiovascular: Negative for chest pain, leg swelling and palpitations.  Gastrointestinal: Negative for abdominal distention, abdominal pain, constipation, diarrhea, nausea and vomiting.  Endocrine: Negative for hot flashes.  Musculoskeletal: Negative for arthralgias.  Skin: Negative for itching and rash.  Neurological: Negative for dizziness, extremity weakness, headaches and numbness.  Hematological: Negative for adenopathy. Does not bruise/bleed easily.  Psychiatric/Behavioral: Negative for depression. The patient is not nervous/anxious.       PHYSICAL EXAMINATION  ECOG PERFORMANCE STATUS: 1 - Symptomatic but completely ambulatory  Vitals:   03/16/18 1008  BP: (!) 125/59  Pulse: 84  Resp: 18  Temp: 99 F (37.2 C)  SpO2: 98%    Physical Exam Constitutional:      Appearance: She is well-developed.  HENT:     Head: Normocephalic and atraumatic.  Eyes:     General: No scleral icterus.    Pupils: Pupils are equal, round, and reactive to light.  Neck:      Musculoskeletal: Neck supple.  Cardiovascular:     Rate and Rhythm: Normal rate and regular rhythm.     Heart sounds: Normal heart sounds.  Pulmonary:     Effort: Pulmonary effort is normal.     Breath sounds: Normal breath sounds.     Comments: Left breast s/p mastectomy and reconstruction, no sign of recurrence, right breast s/p reconstruction and lumpectomy, no sign of recurrence Abdominal:     General: Bowel sounds are normal. There is no distension.     Palpations: Abdomen is soft. There is no mass.     Tenderness: There is no abdominal tenderness. There is no guarding.  Lymphadenopathy:     Cervical: No cervical adenopathy.  Skin:    General: Skin is warm and dry.     Capillary Refill: Capillary refill takes less than 2 seconds.     Findings: No rash.  Neurological:     Mental Status: She is alert and oriented to person, place, and time.     LABORATORY DATA:  CBC    Component Value Date/Time   WBC 3.5 (L) 03/16/2018 0956   WBC 3.1 (L) 03/04/2017 1142   WBC 2.8 (L) 10/17/2014 0940   RBC 4.37 03/16/2018 0956   HGB 14.0 03/16/2018 0956   HGB 13.9 03/04/2017 1142   HCT 43.2 03/16/2018 0956   HCT 42.2 03/04/2017 1142   PLT 48 (L) 03/16/2018 0956   PLT 48 (L) 03/04/2017 1142   MCV 98.9 03/16/2018 0956   MCV 99.1 03/04/2017 1142   MCH 32.0 03/16/2018 0956   MCHC 32.4 03/16/2018 0956   RDW 14.0 03/16/2018 0956   RDW 14.3 03/04/2017 1142   LYMPHSABS 0.8 03/16/2018 0956   LYMPHSABS 1.0 03/04/2017 1142   MONOABS 0.3 03/16/2018 0956   MONOABS 0.2 03/04/2017 1142   EOSABS 0.0 03/16/2018 0956   EOSABS 0.0 03/04/2017 1142   BASOSABS 0.0 03/16/2018 0956   BASOSABS 0.0 03/04/2017 1142    CMP     Component Value Date/Time   NA 143 03/16/2018 0956   NA 144 03/04/2017 1142   K 3.9 03/16/2018 0956   K 4.5 03/04/2017 1142   CL 110 03/16/2018 0956   CL 109 (H) 08/19/2012 1021   CO2 26 03/16/2018 0956   CO2 30 (H) 03/04/2017 1142   GLUCOSE 97 03/16/2018 0956  GLUCOSE  92 03/04/2017 1142   GLUCOSE 91 08/19/2012 1021   BUN 10 03/16/2018 0956   BUN 8.2 03/04/2017 1142   CREATININE 0.74 03/16/2018 0956   CREATININE 0.8 03/04/2017 1142   CALCIUM 8.5 (L) 03/16/2018 0956   CALCIUM 9.0 03/04/2017 1142   PROT 7.0 03/16/2018 0956   PROT 7.2 03/04/2017 1142   ALBUMIN 3.9 03/16/2018 0956   ALBUMIN 3.9 03/04/2017 1142   AST 18 03/16/2018 0956   AST 19 03/04/2017 1142   ALT 18 03/16/2018 0956   ALT 19 03/04/2017 1142   ALKPHOS 78 03/16/2018 0956   ALKPHOS 76 03/04/2017 1142   BILITOT 0.6 03/16/2018 0956   BILITOT 0.54 03/04/2017 1142   GFRNONAA >60 03/16/2018 0956   GFRAA >60 03/16/2018 0956            ASSESSMENT and PLAN:   Breast cancer of upper-inner quadrant of left female breast Saramarie has no sign of recurrence of her breast cancer.  She is taking the Anastrozole daily and tolerating it well.  We discussed the possibility of doing breast MRI to evaluate her implants, and breasts at this point.  We discussed that typically plastic surgeon will manage this, however [facility] would like for [place] to manage this.    She is Anderson to date with bone density and mammogram and has these done with Dr. Radene Knee.  MDS (myelodysplastic syndrome), low grade MDS labs are stable.  Will continue to monitor every 3 months with office visit every 6 months.  Patient ok to get Shingrix vaccine.     Orders Placed This Encounter  Procedures  . DG HIP UNILAT WITH PELVIS 2-3 VIEWS RIGHT    Standing Status:   Future    Number of Occurrences:   1    Standing Expiration Date:   05/15/2019    Order Specific Question:   Reason for Exam (SYMPTOM  OR DIAGNOSIS REQUIRED)    Answer:   right sided hip pain    Order Specific Question:   Preferred imaging location?    Answer:   [facility]    Order Specific Question:   Radiology Contrast Protocol - do NOT remove file path    Answer:   [email or web address]    Marie Anderson will return in 3  months for labs and in 6 months for lab and appointment.  All questions were answered. The patient knows to call the clinic with any problems, questions or concerns. We can certainly see the patient much sooner if necessary.  A total of (30) minutes of face-to-face time was spent with this patient with greater than 50% of that time in counseling and care-coordination.  This note was electronically signed. Scot Dock, NP 03/16/2018

## 2018-03-16 NOTE — Assessment & Plan Note (Signed)
MDS labs are stable.  Will continue to monitor every 3 months with office visit every 6 months.  Patient ok to get Shingrix vaccine.

## 2018-03-16 NOTE — Telephone Encounter (Signed)
Scheduled appt per 1/15 los - gave patient AVS and calender per los.   

## 2018-03-17 ENCOUNTER — Other Ambulatory Visit: Payer: Self-pay | Admitting: Adult Health

## 2018-03-17 DIAGNOSIS — R9389 Abnormal findings on diagnostic imaging of other specified body structures: Secondary | ICD-10-CM

## 2018-03-17 DIAGNOSIS — Z170 Estrogen receptor positive status [ER+]: Principal | ICD-10-CM

## 2018-03-17 DIAGNOSIS — C50212 Malignant neoplasm of upper-inner quadrant of left female breast: Secondary | ICD-10-CM

## 2018-03-17 NOTE — Progress Notes (Signed)
Called patient and reviewed hip xray that revealed concern of sclerotic focus, but ultimately indeterminate.  Bone scan ordered to fully evaluate and r/o breast cancer metastases.    Wilber Bihari, NP

## 2018-03-25 ENCOUNTER — Telehealth: Payer: Self-pay

## 2018-03-25 NOTE — Telephone Encounter (Signed)
Spoke with pt to follow up on her bone scan. Pt states that she was supposed to get the scan done this week, but has not heard anything about her appt. Called scheduling to make pt appt for bone scan next week (1st available). Pt would like to get this done asap since she is scheduled for a cruise on 2/5. Pt asking if she needs to cancel this trip. Told pt to wait for bone scan results before making any cancellation plans. Provided pt with central scheduling number to see if they can work her in earlier in the week, next week. Pt very appreciative of assistance. No further needs at this time.

## 2018-03-28 DIAGNOSIS — D696 Thrombocytopenia, unspecified: Secondary | ICD-10-CM | POA: Diagnosis not present

## 2018-03-28 DIAGNOSIS — Z1211 Encounter for screening for malignant neoplasm of colon: Secondary | ICD-10-CM | POA: Diagnosis not present

## 2018-03-29 ENCOUNTER — Other Ambulatory Visit: Payer: Self-pay | Admitting: Gastroenterology

## 2018-03-29 DIAGNOSIS — D696 Thrombocytopenia, unspecified: Secondary | ICD-10-CM

## 2018-03-29 DIAGNOSIS — Z1211 Encounter for screening for malignant neoplasm of colon: Secondary | ICD-10-CM

## 2018-03-30 ENCOUNTER — Encounter (HOSPITAL_COMMUNITY)
Admission: RE | Admit: 2018-03-30 | Discharge: 2018-03-30 | Disposition: A | Discharge status: Home / Self Care | Payer: Medicare Other | Source: Ambulatory Visit | Attending: Adult Health | Admitting: Adult Health

## 2018-03-30 DIAGNOSIS — C50212 Malignant neoplasm of upper-inner quadrant of left female breast: Secondary | ICD-10-CM | POA: Diagnosis not present

## 2018-03-30 DIAGNOSIS — Z170 Estrogen receptor positive status [ER+]: Secondary | ICD-10-CM | POA: Insufficient documentation

## 2018-03-30 DIAGNOSIS — Z853 Personal history of malignant neoplasm of breast: Secondary | ICD-10-CM | POA: Diagnosis not present

## 2018-03-30 DIAGNOSIS — R9389 Abnormal findings on diagnostic imaging of other specified body structures: Secondary | ICD-10-CM

## 2018-03-30 MED ORDER — TECHNETIUM TC 99M MEDRONATE IV KIT
20.0000 | PACK | Freq: Once | INTRAVENOUS | Status: AC | PRN
  Administered 2018-03-30: 20 via INTRAVENOUS

## 2018-03-31 ENCOUNTER — Telehealth: Payer: Self-pay | Admitting: Adult Health

## 2018-03-31 DIAGNOSIS — Z170 Estrogen receptor positive status [ER+]: Principal | ICD-10-CM

## 2018-03-31 DIAGNOSIS — M25551 Pain in right hip: Secondary | ICD-10-CM

## 2018-03-31 DIAGNOSIS — C50212 Malignant neoplasm of upper-inner quadrant of left female breast: Secondary | ICD-10-CM

## 2018-03-31 NOTE — Telephone Encounter (Signed)
Called patient to review bone scan results.  Bone scan shows likely right hip degeneration, but recommends MRI to confirm.   Patient is leaving to go on cruise next Tuesday.  She would like to do the MRI after that.  Reviewed dates with patient.    Wilber Bihari, NP

## 2018-04-01 ENCOUNTER — Ambulatory Visit (HOSPITAL_COMMUNITY): Payer: Medicare Other

## 2018-04-01 ENCOUNTER — Other Ambulatory Visit (HOSPITAL_COMMUNITY): Payer: Medicare Other

## 2018-04-26 DIAGNOSIS — Z85820 Personal history of malignant melanoma of skin: Secondary | ICD-10-CM | POA: Diagnosis not present

## 2018-04-26 DIAGNOSIS — Z8051 Family history of malignant neoplasm of kidney: Secondary | ICD-10-CM | POA: Diagnosis not present

## 2018-04-26 DIAGNOSIS — C50912 Malignant neoplasm of unspecified site of left female breast: Secondary | ICD-10-CM | POA: Diagnosis not present

## 2018-04-26 DIAGNOSIS — Z8041 Family history of malignant neoplasm of ovary: Secondary | ICD-10-CM | POA: Diagnosis not present

## 2018-04-27 ENCOUNTER — Ambulatory Visit
Admission: RE | Admit: 2018-04-27 | Discharge: 2018-04-27 | Disposition: A | Discharge status: Home / Self Care | Payer: Medicare Other | Source: Ambulatory Visit | Attending: Adult Health | Admitting: Adult Health

## 2018-04-27 DIAGNOSIS — Z170 Estrogen receptor positive status [ER+]: Principal | ICD-10-CM

## 2018-04-27 DIAGNOSIS — M25551 Pain in right hip: Secondary | ICD-10-CM | POA: Diagnosis not present

## 2018-04-27 DIAGNOSIS — C50212 Malignant neoplasm of upper-inner quadrant of left female breast: Secondary | ICD-10-CM

## 2018-04-27 MED ORDER — GADOBENATE DIMEGLUMINE 529 MG/ML IV SOLN
20.0000 mL | Freq: Once | INTRAVENOUS | Status: AC | PRN
  Administered 2018-04-27: 20 mL via INTRAVENOUS

## 2018-04-28 ENCOUNTER — Telehealth: Payer: Self-pay | Admitting: Adult Health

## 2018-04-28 NOTE — Telephone Encounter (Signed)
Called and LMOM to give results from MRI.    Wilber Bihari, NP

## 2018-04-29 ENCOUNTER — Telehealth: Payer: Self-pay

## 2018-04-29 NOTE — Telephone Encounter (Signed)
Called pt to go over results of MRI.  No answer.  No VM

## 2018-04-29 NOTE — Telephone Encounter (Signed)
Patient came to center to get results of MRI.  Results given, no cancer at this time.  Patient inquired about the continuing pain in her hip and per NP she should she should see some one in ortho.  Patient voiced understanding.    Patient also requested a refill on zolpidem 12.5 mg prn for sleep.  One refill sent to [facility] for patient per NP.

## 2018-05-02 ENCOUNTER — Ambulatory Visit
Admission: RE | Admit: 2018-05-02 | Discharge: 2018-05-02 | Disposition: A | Discharge status: Home / Self Care | Payer: Medicare Other | Source: Ambulatory Visit | Attending: Gastroenterology | Admitting: Gastroenterology

## 2018-05-02 DIAGNOSIS — D696 Thrombocytopenia, unspecified: Secondary | ICD-10-CM

## 2018-05-02 DIAGNOSIS — Z1211 Encounter for screening for malignant neoplasm of colon: Secondary | ICD-10-CM

## 2018-05-03 ENCOUNTER — Other Ambulatory Visit: Payer: Self-pay | Admitting: Adult Health

## 2018-05-03 DIAGNOSIS — D4620 Refractory anemia with excess of blasts, unspecified: Secondary | ICD-10-CM

## 2018-05-03 DIAGNOSIS — F5101 Primary insomnia: Secondary | ICD-10-CM

## 2018-05-03 MED ORDER — ZOLPIDEM TARTRATE ER 12.5 MG PO TBCR: 12.5000 mg | EXTENDED_RELEASE_TABLET | Freq: Every evening | ORAL | 0 refills | Status: DC | PRN

## 2018-06-15 ENCOUNTER — Inpatient Hospital Stay: Payer: Medicare Other | Attending: Hematology and Oncology

## 2018-06-15 ENCOUNTER — Telehealth: Payer: Self-pay

## 2018-06-15 ENCOUNTER — Other Ambulatory Visit: Payer: Self-pay

## 2018-06-15 DIAGNOSIS — C50212 Malignant neoplasm of upper-inner quadrant of left female breast: Secondary | ICD-10-CM | POA: Diagnosis not present

## 2018-06-15 DIAGNOSIS — Z170 Estrogen receptor positive status [ER+]: Secondary | ICD-10-CM | POA: Insufficient documentation

## 2018-06-15 LAB — CMP (CANCER CENTER ONLY)
ALT: 17 U/L (ref 0–44)
AST: 20 U/L (ref 15–41)
Albumin: 3.8 g/dL (ref 3.5–5.0)
Alkaline Phosphatase: 73 U/L (ref 38–126)
Anion gap: 11 (ref 5–15)
BUN: 11 mg/dL (ref 8–23)
CO2: 27 mmol/L (ref 22–32)
Calcium: 8.6 mg/dL — ABNORMAL LOW (ref 8.9–10.3)
Chloride: 106 mmol/L (ref 98–111)
Creatinine: 0.79 mg/dL (ref 0.44–1.00)
GFR, Est AFR Am: >60 mL/min (ref >60)
GFR, Estimated: >60 mL/min (ref >60)
Glucose, Bld: 89 mg/dL (ref 70–99)
Potassium: 3.8 mmol/L (ref 3.5–5.1)
Sodium: 144 mmol/L (ref 135–145)
Total Bilirubin: 0.5 mg/dL (ref 0.3–1.2)
Total Protein: 7.5 g/dL (ref 6.5–8.1)

## 2018-06-15 LAB — CBC WITH DIFFERENTIAL (CANCER CENTER ONLY)
Abs Immature Granulocytes: 0.01 10*3/uL (ref 0.00–0.07)
Basophils Absolute: 0 10*3/uL (ref 0.0–0.1)
Basophils Relative: 1 %
Eosinophils Absolute: 0.1 10*3/uL (ref 0.0–0.5)
Eosinophils Relative: 2 %
HCT: 43.6 % (ref 36.0–46.0)
Hemoglobin: 14.2 g/dL (ref 12.0–15.0)
Immature Granulocytes: 0 %
Lymphocytes Relative: 28 %
Lymphs Abs: 0.9 10*3/uL (ref 0.7–4.0)
MCH: 31.8 pg (ref 26.0–34.0)
MCHC: 32.6 g/dL (ref 30.0–36.0)
MCV: 97.5 fL (ref 80.0–100.0)
Monocytes Absolute: 0.3 10*3/uL (ref 0.1–1.0)
Monocytes Relative: 9 %
Neutro Abs: 1.9 10*3/uL (ref 1.7–7.7)
Neutrophils Relative %: 60 %
Platelet Count: 48 10*3/uL — ABNORMAL LOW (ref 150–400)
RBC: 4.47 MIL/uL (ref 3.87–5.11)
RDW: 13.8 % (ref 11.5–15.5)
WBC Count: 3.1 10*3/uL — ABNORMAL LOW (ref 4.0–10.5)
nRBC: 0 % (ref 0.0–0.2)

## 2018-06-15 NOTE — Telephone Encounter (Signed)
-----   Message from Gardenia Phlegm, NP sent at 06/15/2018  3:17 PM EDT ----- Please call patient with her results.  They remain stable ----- Message ----- From: Interface, Lab In [place] Sent: 06/15/2018  11:00 AM EDT To: Gardenia Phlegm, NP

## 2018-06-15 NOTE — Telephone Encounter (Signed)
Called and given below message. She verbalized understanding. 

## 2018-07-08 ENCOUNTER — Other Ambulatory Visit: Payer: Self-pay | Admitting: *Deleted

## 2018-07-08 ENCOUNTER — Telehealth: Payer: Self-pay | Admitting: *Deleted

## 2018-07-08 DIAGNOSIS — D4620 Refractory anemia with excess of blasts, unspecified: Secondary | ICD-10-CM

## 2018-07-08 DIAGNOSIS — F5101 Primary insomnia: Secondary | ICD-10-CM

## 2018-07-08 MED ORDER — ZOLPIDEM TARTRATE ER 12.5 MG PO TBCR: 12.5000 mg | EXTENDED_RELEASE_TABLET | Freq: Every evening | ORAL | 0 refills | Status: DC | PRN

## 2018-07-08 NOTE — Telephone Encounter (Signed)
Will you please call in for her?  Thanks,  [place]

## 2018-07-08 NOTE — Telephone Encounter (Signed)
Message left on VM of NP nurse phone- pt is requesting refill on zolpidem.   Last filled by [facility]/NP.  Will forward request.

## 2018-07-26 DIAGNOSIS — M25572 Pain in left ankle and joints of left foot: Secondary | ICD-10-CM | POA: Diagnosis not present

## 2018-08-10 DIAGNOSIS — S8252XA Displaced fracture of medial malleolus of left tibia, initial encounter for closed fracture: Secondary | ICD-10-CM | POA: Diagnosis not present

## 2018-08-10 DIAGNOSIS — M25551 Pain in right hip: Secondary | ICD-10-CM | POA: Diagnosis not present

## 2018-08-10 DIAGNOSIS — M25572 Pain in left ankle and joints of left foot: Secondary | ICD-10-CM | POA: Diagnosis not present

## 2018-08-31 DIAGNOSIS — D2271 Melanocytic nevi of right lower limb, including hip: Secondary | ICD-10-CM | POA: Diagnosis not present

## 2018-08-31 DIAGNOSIS — Z85828 Personal history of other malignant neoplasm of skin: Secondary | ICD-10-CM | POA: Diagnosis not present

## 2018-08-31 DIAGNOSIS — D2230 Melanocytic nevi of unspecified part of face: Secondary | ICD-10-CM | POA: Diagnosis not present

## 2018-08-31 DIAGNOSIS — Z86018 Personal history of other benign neoplasm: Secondary | ICD-10-CM | POA: Diagnosis not present

## 2018-08-31 DIAGNOSIS — L821 Other seborrheic keratosis: Secondary | ICD-10-CM | POA: Diagnosis not present

## 2018-08-31 DIAGNOSIS — D2272 Melanocytic nevi of left lower limb, including hip: Secondary | ICD-10-CM | POA: Diagnosis not present

## 2018-08-31 DIAGNOSIS — D225 Melanocytic nevi of trunk: Secondary | ICD-10-CM | POA: Diagnosis not present

## 2018-09-18 ENCOUNTER — Other Ambulatory Visit: Payer: Self-pay | Admitting: Hematology and Oncology

## 2018-09-22 ENCOUNTER — Telehealth: Payer: Self-pay | Admitting: Hematology and Oncology

## 2018-09-22 ENCOUNTER — Encounter: Payer: Self-pay | Admitting: Adult Health

## 2018-09-22 ENCOUNTER — Inpatient Hospital Stay: Payer: Medicare Other | Attending: Adult Health | Admitting: Adult Health

## 2018-09-22 ENCOUNTER — Other Ambulatory Visit: Payer: Self-pay | Admitting: Hematology and Oncology

## 2018-09-22 ENCOUNTER — Other Ambulatory Visit: Payer: Self-pay

## 2018-09-22 ENCOUNTER — Inpatient Hospital Stay: Payer: Medicare Other

## 2018-09-22 VITALS — BP 130/78 | HR 68 | Temp 98.2°F | Resp 16 | Ht 65.0 in | Wt 212.4 lb

## 2018-09-22 DIAGNOSIS — Z923 Personal history of irradiation: Secondary | ICD-10-CM | POA: Diagnosis not present

## 2018-09-22 DIAGNOSIS — Z823 Family history of stroke: Secondary | ICD-10-CM | POA: Insufficient documentation

## 2018-09-22 DIAGNOSIS — Z9221 Personal history of antineoplastic chemotherapy: Secondary | ICD-10-CM | POA: Diagnosis not present

## 2018-09-22 DIAGNOSIS — M722 Plantar fascial fibromatosis: Secondary | ICD-10-CM | POA: Diagnosis not present

## 2018-09-22 DIAGNOSIS — M79672 Pain in left foot: Secondary | ICD-10-CM | POA: Insufficient documentation

## 2018-09-22 DIAGNOSIS — M19172 Post-traumatic osteoarthritis, left ankle and foot: Secondary | ICD-10-CM | POA: Insufficient documentation

## 2018-09-22 DIAGNOSIS — Z8042 Family history of malignant neoplasm of prostate: Secondary | ICD-10-CM | POA: Diagnosis not present

## 2018-09-22 DIAGNOSIS — Z9012 Acquired absence of left breast and nipple: Secondary | ICD-10-CM | POA: Insufficient documentation

## 2018-09-22 DIAGNOSIS — G4700 Insomnia, unspecified: Secondary | ICD-10-CM | POA: Insufficient documentation

## 2018-09-22 DIAGNOSIS — Z79899 Other long term (current) drug therapy: Secondary | ICD-10-CM | POA: Diagnosis not present

## 2018-09-22 DIAGNOSIS — F5101 Primary insomnia: Secondary | ICD-10-CM

## 2018-09-22 DIAGNOSIS — Z170 Estrogen receptor positive status [ER+]: Secondary | ICD-10-CM | POA: Insufficient documentation

## 2018-09-22 DIAGNOSIS — D4620 Refractory anemia with excess of blasts, unspecified: Secondary | ICD-10-CM

## 2018-09-22 DIAGNOSIS — C50212 Malignant neoplasm of upper-inner quadrant of left female breast: Secondary | ICD-10-CM | POA: Diagnosis not present

## 2018-09-22 DIAGNOSIS — D469 Myelodysplastic syndrome, unspecified: Secondary | ICD-10-CM | POA: Insufficient documentation

## 2018-09-22 DIAGNOSIS — M779 Enthesopathy, unspecified: Secondary | ICD-10-CM | POA: Diagnosis not present

## 2018-09-22 LAB — CBC WITH DIFFERENTIAL (CANCER CENTER ONLY)
Abs Immature Granulocytes: 0 10*3/uL (ref 0.00–0.07)
Basophils Absolute: 0 10*3/uL (ref 0.0–0.1)
Basophils Relative: 1 %
Eosinophils Absolute: 0 10*3/uL (ref 0.0–0.5)
Eosinophils Relative: 1 %
HCT: 41.9 % (ref 36.0–46.0)
Hemoglobin: 13.8 g/dL (ref 12.0–15.0)
Immature Granulocytes: 0 %
Lymphocytes Relative: 36 %
Lymphs Abs: 1.1 10*3/uL (ref 0.7–4.0)
MCH: 31.9 pg (ref 26.0–34.0)
MCHC: 32.9 g/dL (ref 30.0–36.0)
MCV: 97 fL (ref 80.0–100.0)
Monocytes Absolute: 0.3 10*3/uL (ref 0.1–1.0)
Monocytes Relative: 9 %
Neutro Abs: 1.6 10*3/uL — ABNORMAL LOW (ref 1.7–7.7)
Neutrophils Relative %: 53 %
Platelet Count: 51 10*3/uL — ABNORMAL LOW (ref 150–400)
RBC: 4.32 MIL/uL (ref 3.87–5.11)
RDW: 13.5 % (ref 11.5–15.5)
WBC Count: 3 10*3/uL — ABNORMAL LOW (ref 4.0–10.5)
nRBC: 0 % (ref 0.0–0.2)

## 2018-09-22 LAB — CMP (CANCER CENTER ONLY)
ALT: 15 U/L (ref 0–44)
AST: 19 U/L (ref 15–41)
Albumin: 3.8 g/dL (ref 3.5–5.0)
Alkaline Phosphatase: 77 U/L (ref 38–126)
Anion gap: 8 (ref 5–15)
BUN: 15 mg/dL (ref 8–23)
CO2: 27 mmol/L (ref 22–32)
Calcium: 8.9 mg/dL (ref 8.9–10.3)
Chloride: 108 mmol/L (ref 98–111)
Creatinine: 0.77 mg/dL (ref 0.44–1.00)
GFR, Est AFR Am: >60 mL/min (ref >60)
GFR, Estimated: >60 mL/min (ref >60)
Glucose, Bld: 94 mg/dL (ref 70–99)
Potassium: 4.1 mmol/L (ref 3.5–5.1)
Sodium: 143 mmol/L (ref 135–145)
Total Bilirubin: 0.6 mg/dL (ref 0.3–1.2)
Total Protein: 7.1 g/dL (ref 6.5–8.1)

## 2018-09-22 MED ORDER — ZOLPIDEM TARTRATE ER 12.5 MG PO TBCR: 12.5000 mg | EXTENDED_RELEASE_TABLET | Freq: Every evening | ORAL | 0 refills | Status: DC | PRN

## 2018-09-22 NOTE — Assessment & Plan Note (Addendum)
Marie Anderson has no sign of recurrence of her breast cancer.  She is taking the Anastrozole daily and tolerating it well.  She will continue this.  I refilled her Lorrin Mais for her sleep pattern disturbance.   She is up to date with bone density and right breast mammogram and has these done with Dr. Radene Knee.  She will continue to f/u with his office for these.    We reviewed healthy diet and exercise in detail.  She is doing as much as she can right now with her left foot pain.

## 2018-09-22 NOTE — Assessment & Plan Note (Addendum)
MDS remains stable.  We will continue to monitor her labs every 3 months and will see her every 6 months.  With her bone marrow being swollen, we will monitor her upcoming appointment and xray results from Dr. Marcelino Scot.

## 2018-09-22 NOTE — Progress Notes (Signed)
[facility] Follow up:    Lawerance Cruel, MD [place] [address]   DIAGNOSIS: Cancer Staging Breast cancer of upper-inner quadrant of left female breast [facility]) Staging form: Breast, AJCC 7th Edition - Clinical stage from 12/16/2011: Stage IIA (T2, N0, cM0) - Unsigned - Pathologic: No stage assigned - Unsigned   SUMMARY OF ONCOLOGIC HISTORY: Oncology History  Breast cancer of upper-inner quadrant of left female breast ([place])  12/31/2011 - 03/11/2012 Neo-Adjuvant Chemotherapy   Taxotere Herceptin Perjeta 4 followed by Herceptin maintenance completed 01/29/2013   04/06/2012 Surgery   Left breast mastectomy: IDC grade 1; 0.8 cm, low-grade DCIS, LV I identified, 1/2 lymph node Micro met, ER/PR positive HER-2 positive Ki-67 94%   05/04/2012 - 06/20/2012 Radiation Therapy   Adjuvant radiation therapy   07/29/2012 -  Anti-estrogen oral therapy   Arimidex 1 mg daily   10/17/2014 Procedure   Bone marrow biopsy for thrombocytopenia and leukopenia: Hypercellular marrow with trilineage dysplasia most apparent in megakaryocytes with associated myelofibrosis: low-grade MDS with refractory cytopenias and multilineage dysplasia (20q- partial del)   MDS (myelodysplastic syndrome), low grade ([place])  10/17/2014 Initial Diagnosis   MDS (myelodysplastic syndrome), low grade , refractory cytopenia with multi nature dysplasia cytogenetics -20q (associated with good prognosis MDS)     CURRENT THERAPY: Anastrozole  INTERVAL HISTORY: TERRIKA ZUVER 73 y.o. female returns for evaluation of her h/o breast cancer and MDS.  She is taking Anastrozole daily.  She is planning on taking this for 7 years.  She has regular bone density testing with Dr. Ophelia Charter.  She still has her right breast with implants.  She notes she is not due for right breast mammogram.  She says she had one with Dr. Ophelia Charter at [facility].    Kordelia saw Dr. Marcelino Scot at [facility] ortho.  He let  her know that her bone marrow is swollen in her right upper femur.  He saw no evidence of a stress fracture.  She will have a repeat xray in 01/2019 to see if there is any change.  She notes that she broke her left ankle several years ago, and she has spurs, arthritis, and plantar fasciitis.  She is wearing supportive shoes at this point to help her foot and the pain she is experiencing.    She needs a refill on her [place].  It helps her sleep.  She commits 8 hours to sleep when she takes it, and notes no significant side effects.     Patient Active Problem List   Diagnosis Date Noted  . Breast cancer of upper-inner quadrant of left female breast ([place]) 12/10/2011    Priority: High  . Insomnia 05/08/2015  . MDS (myelodysplastic syndrome), low grade ([place]) 11/07/2014  . History of chemotherapy   . Anemia   . SVT (supraventricular tachycardia) ([place]) 02/04/2011    is allergic to cephalexin; cephalosporins; codeine; and tape.  MEDICAL HISTORY: Past Medical History:  Diagnosis Date  . Anemia   . Antiphospholipid antibody syndrome ([place])   . Breast cancer ([place]) 12/04/11   left- inv ductal ca, DCIS, ER/PR +, HER 2 +  . History of chemotherapy 12/31/11 -03/11/12   s/p 4 cycles  . History of radiation therapy 05/04/12-06/20/12   left breast/  . Myelodysplasia   . Neuropathy   . PONV (postoperative nausea and vomiting)   . SVT (supraventricular tachycardia) ([place])     SURGICAL HISTORY: Past Surgical History:  Procedure Laterality Date  .  AUGMENTATION MAMMAPLASTY Bilateral    2014 with reduction on right  . BREAST RECONSTRUCTION WITH PLACEMENT OF TISSUE EXPANDER AND FLEX HD (ACELLULAR HYDRATED DERMIS)  04/06/2012   Procedure: BREAST RECONSTRUCTION WITH PLACEMENT OF TISSUE EXPANDER AND FLEX HD (ACELLULAR HYDRATED DERMIS);  Surgeon: Theodoro Kos, DO;  Location: [facility];  Service: Plastics;  Laterality: Left;  IMMEDIATE LEFT BREAST RECONSTRUCTION WITH PLACEMENT OF TISSUE EXPANDER  AND ALLODERM  . COLONOSCOPY    . DILATION AND CURETTAGE OF UTERUS    . EYE SURGERY     age 75-lt eye growth  . LIPOSUCTION WITH LIPOFILLING N/A 01/19/2013   Procedure: LIPOSUCTION WITH LIPOFILLING;  Surgeon: Theodoro Kos, DO;  Location: [facility];  Service: Plastics;  Laterality: N/A;  . MASTECTOMY Left    2014  . MASTECTOMY W/ SENTINEL NODE BIOPSY  04/06/2012   Procedure: MASTECTOMY WITH SENTINEL LYMPH NODE BIOPSY;  Surgeon: Rolm Bookbinder, MD;  Location: [facility];  Service: General;  Laterality: Left;  left mastectomy, left axillary sentinel node biopsy  . MASTOPEXY Right 01/19/2013   Procedure: RIGHT BREAST PLACEMENT OF IMPLANT WITH MASTOPEXY;  Surgeon: Theodoro Kos, DO;  Location: [facility];  Service: Plastics;  Laterality: Right;  . PORT-A-CATH REMOVAL Right 01/19/2013   Procedure: REMOVAL PORT-A-CATH;  Surgeon: Rolm Bookbinder, MD;  Location: [facility];  Service: General;  Laterality: Right;  . PORTACATH PLACEMENT  12/23/2011   Procedure: INSERTION PORT-A-CATH;  Surgeon: Rolm Bookbinder, MD;  Location: [facility];  Service: General;  Laterality: Right;  . REMOVAL OF TISSUE EXPANDER AND PLACEMENT OF IMPLANT Left 01/19/2013   Procedure: REMOVAL OF LEFT TISSUE EXPANDERS WITH PLACEMENT OF LEFT BREAST IMPLANT;  Surgeon: Theodoro Kos, DO;  Location: [facility];  Service: Plastics;  Laterality: Left;    SOCIAL HISTORY: Social History   Socioeconomic History  . Marital status: Married    Spouse name: Not on file  . Number of children: 2  . Years of education: Not on file  . Highest education level: Not on file  Occupational History  . Occupation: Engineer, manufacturing systems    Comment: retired  . Occupation: front office     Comment: [employer]  Social Needs  . Financial resource strain: Not on file  . Food insecurity    Worry: Not on file    Inability: Not on file  .  Transportation needs    Medical: Not on file    Non-medical: Not on file  Tobacco Use  . Smoking status: Never Smoker  . Smokeless tobacco: Never Used  Substance and Sexual Activity  . Alcohol use: Yes  . Drug use: No  . Sexual activity: Yes  Lifestyle  . Physical activity    Days per week: Not on file    Minutes per session: Not on file  . Stress: Not on file  Relationships  . Social Herbalist on phone: Not on file    Gets together: Not on file    Attends religious service: Not on file    Active member of club or organization: Not on file    Attends meetings of clubs or organizations: Not on file    Relationship status: Not on file  . Intimate partner violence    Fear of current or ex partner: Not on file    Emotionally abused: Not on file    Physically abused: Not on file    Forced sexual  activity: Not on file  Other Topics Concern  . Not on file  Social History Narrative  . Not on file    FAMILY HISTORY: Family History  Problem Relation Age of Onset  . Stroke Mother   . Cancer Father        prostate    Review of Systems  Constitutional: Negative for appetite change, chills, fatigue, fever and unexpected weight change.  HENT:   Negative for hearing loss, lump/mass, sore throat and trouble swallowing.   Eyes: Negative for eye problems and icterus.  Respiratory: Negative for chest tightness, cough and shortness of breath.   Cardiovascular: Negative for chest pain, leg swelling and palpitations.  Gastrointestinal: Negative for abdominal distention, abdominal pain, constipation, diarrhea, nausea and vomiting.  Endocrine: Negative for hot flashes.  Musculoskeletal: Negative for arthralgias.  Skin: Negative for itching and rash.  Neurological: Negative for dizziness, extremity weakness, headaches and numbness.  Hematological: Negative for adenopathy. Does not bruise/bleed easily.  Psychiatric/Behavioral: Negative for depression. The patient is not  nervous/anxious.       PHYSICAL EXAMINATION  ECOG PERFORMANCE STATUS: 1 - Symptomatic but completely ambulatory  Vitals:   09/22/18 1045  BP: 130/78  Pulse: 68  Resp: 16  Temp: 98.2 F (36.8 C)  SpO2: 97%    Physical Exam Constitutional:      Appearance: She is well-developed.  HENT:     Head: Normocephalic and atraumatic.  Eyes:     General: No scleral icterus.    Pupils: Pupils are equal, round, and reactive to light.  Neck:     Musculoskeletal: Neck supple.  Cardiovascular:     Rate and Rhythm: Normal rate and regular rhythm.     Heart sounds: Normal heart sounds.  Pulmonary:     Effort: Pulmonary effort is normal.     Breath sounds: Normal breath sounds.     Comments: Left breast s/p mastectomy and reconstruction, no sign of recurrence, right breast s/p reconstruction and lumpectomy, no sign of recurrence Abdominal:     General: Bowel sounds are normal. There is no distension.     Palpations: Abdomen is soft. There is no mass.     Tenderness: There is no abdominal tenderness. There is no guarding.  Lymphadenopathy:     Cervical: No cervical adenopathy.  Skin:    General: Skin is warm and dry.     Capillary Refill: Capillary refill takes less than 2 seconds.     Findings: No rash.  Neurological:     Mental Status: She is alert and oriented to person, place, and time.     LABORATORY DATA:  CBC    Component Value Date/Time   WBC 3.0 (L) 09/22/2018 1029   WBC 3.1 (L) 03/04/2017 1142   WBC 2.8 (L) 10/17/2014 0940   RBC 4.32 09/22/2018 1029   HGB 13.8 09/22/2018 1029   HGB 13.9 03/04/2017 1142   HCT 41.9 09/22/2018 1029   HCT 42.2 03/04/2017 1142   PLT 51 (L) 09/22/2018 1029   PLT 48 (L) 03/04/2017 1142   MCV 97.0 09/22/2018 1029   MCV 99.1 03/04/2017 1142   MCH 31.9 09/22/2018 1029   MCHC 32.9 09/22/2018 1029   RDW 13.5 09/22/2018 1029   RDW 14.3 03/04/2017 1142   LYMPHSABS 1.1 09/22/2018 1029   LYMPHSABS 1.0 03/04/2017 1142   MONOABS 0.3  09/22/2018 1029   MONOABS 0.2 03/04/2017 1142   EOSABS 0.0 09/22/2018 1029   EOSABS 0.0 03/04/2017 1142   BASOSABS 0.0 09/22/2018 1029  BASOSABS 0.0 03/04/2017 1142    CMP     Component Value Date/Time   NA 143 09/22/2018 1029   NA 144 03/04/2017 1142   K 4.1 09/22/2018 1029   K 4.5 03/04/2017 1142   CL 108 09/22/2018 1029   CL 109 (H) 08/19/2012 1021   CO2 27 09/22/2018 1029   CO2 30 (H) 03/04/2017 1142   GLUCOSE 94 09/22/2018 1029   GLUCOSE 92 03/04/2017 1142   GLUCOSE 91 08/19/2012 1021   BUN 15 09/22/2018 1029   BUN 8.2 03/04/2017 1142   CREATININE 0.77 09/22/2018 1029   CREATININE 0.8 03/04/2017 1142   CALCIUM 8.9 09/22/2018 1029   CALCIUM 9.0 03/04/2017 1142   PROT 7.1 09/22/2018 1029   PROT 7.2 03/04/2017 1142   ALBUMIN 3.8 09/22/2018 1029   ALBUMIN 3.9 03/04/2017 1142   AST 19 09/22/2018 1029   AST 19 03/04/2017 1142   ALT 15 09/22/2018 1029   ALT 19 03/04/2017 1142   ALKPHOS 77 09/22/2018 1029   ALKPHOS 76 03/04/2017 1142   BILITOT 0.6 09/22/2018 1029   BILITOT 0.54 03/04/2017 1142   GFRNONAA >60 09/22/2018 1029   GFRAA >60 09/22/2018 1029     ASSESSMENT and PLAN:   Breast cancer of upper-inner quadrant of left female breast [place] has no sign of recurrence of her breast cancer.  She is taking the Anastrozole daily and tolerating it well.  She will continue this.  I refilled her Lorrin Mais for her sleep pattern disturbance.   She is up to date with bone density and right breast mammogram and has these done with Dr. Radene Knee.  She will continue to f/u with his office for these.    We reviewed healthy diet and exercise in detail.  She is doing as much as she can right now with her left foot pain.   MDS (myelodysplastic syndrome), low grade MDS remains stable.  We will continue to monitor her labs every 3 months and will see her every 6 months.  With her bone marrow being swollen, we will monitor her upcoming appointment and xray results from Dr. Marcelino Scot.     Bernadette will return in 3 months for labs and in 6 months for lab and appointment.  All questions were answered. The patient knows to call the clinic with any problems, questions or concerns. We can certainly see the patient much sooner if necessary.  She was recommended to continue with the appropriate pandemic precautions.   A total of (30) minutes of face-to-face time was spent with this patient with greater than 50% of that time in counseling and care-coordination.  This note was electronically signed. Scot Dock, NP 09/22/2018

## 2018-09-22 NOTE — Telephone Encounter (Signed)
I talk with patient regarding schedule  

## 2018-12-14 ENCOUNTER — Other Ambulatory Visit: Payer: Self-pay | Admitting: Hematology and Oncology

## 2018-12-21 ENCOUNTER — Other Ambulatory Visit: Payer: Self-pay

## 2018-12-21 DIAGNOSIS — I471 Supraventricular tachycardia: Secondary | ICD-10-CM

## 2018-12-21 MED ORDER — METOPROLOL SUCCINATE ER 100 MG PO TB24: 100.0000 mg | ORAL_TABLET | Freq: Every day | ORAL | 0 refills | Status: DC

## 2018-12-23 ENCOUNTER — Other Ambulatory Visit: Payer: Self-pay

## 2018-12-23 ENCOUNTER — Inpatient Hospital Stay: Payer: Medicare Other | Attending: Hematology and Oncology

## 2018-12-23 DIAGNOSIS — Z170 Estrogen receptor positive status [ER+]: Secondary | ICD-10-CM | POA: Diagnosis not present

## 2018-12-23 DIAGNOSIS — C50212 Malignant neoplasm of upper-inner quadrant of left female breast: Secondary | ICD-10-CM | POA: Insufficient documentation

## 2018-12-23 LAB — CMP (CANCER CENTER ONLY)
ALT: 16 U/L (ref 0–44)
AST: 19 U/L (ref 15–41)
Albumin: 3.9 g/dL (ref 3.5–5.0)
Alkaline Phosphatase: 69 U/L (ref 38–126)
Anion gap: 5 (ref 5–15)
BUN: 13 mg/dL (ref 8–23)
CO2: 29 mmol/L (ref 22–32)
Calcium: 8.5 mg/dL — ABNORMAL LOW (ref 8.9–10.3)
Chloride: 108 mmol/L (ref 98–111)
Creatinine: 0.8 mg/dL (ref 0.44–1.00)
GFR, Est AFR Am: >60 mL/min (ref >60)
GFR, Estimated: >60 mL/min (ref >60)
Glucose, Bld: 95 mg/dL (ref 70–99)
Potassium: 4.2 mmol/L (ref 3.5–5.1)
Sodium: 142 mmol/L (ref 135–145)
Total Bilirubin: 0.6 mg/dL (ref 0.3–1.2)
Total Protein: 7.1 g/dL (ref 6.5–8.1)

## 2018-12-23 LAB — CBC WITH DIFFERENTIAL (CANCER CENTER ONLY)
Abs Immature Granulocytes: 0.01 10*3/uL (ref 0.00–0.07)
Basophils Absolute: 0 10*3/uL (ref 0.0–0.1)
Basophils Relative: 1 %
Eosinophils Absolute: 0 10*3/uL (ref 0.0–0.5)
Eosinophils Relative: 1 %
HCT: 42.9 % (ref 36.0–46.0)
Hemoglobin: 14.1 g/dL (ref 12.0–15.0)
Immature Granulocytes: 0 %
Lymphocytes Relative: 34 %
Lymphs Abs: 1.1 10*3/uL (ref 0.7–4.0)
MCH: 32.8 pg (ref 26.0–34.0)
MCHC: 32.9 g/dL (ref 30.0–36.0)
MCV: 99.8 fL (ref 80.0–100.0)
Monocytes Absolute: 0.3 10*3/uL (ref 0.1–1.0)
Monocytes Relative: 9 %
Neutro Abs: 1.7 10*3/uL (ref 1.7–7.7)
Neutrophils Relative %: 55 %
Platelet Count: 57 10*3/uL — ABNORMAL LOW (ref 150–400)
RBC: 4.3 MIL/uL (ref 3.87–5.11)
RDW: 13.8 % (ref 11.5–15.5)
WBC Count: 3.2 10*3/uL — ABNORMAL LOW (ref 4.0–10.5)
nRBC: 0 % (ref 0.0–0.2)

## 2018-12-30 DIAGNOSIS — Z23 Encounter for immunization: Secondary | ICD-10-CM | POA: Diagnosis not present

## 2019-01-08 ENCOUNTER — Other Ambulatory Visit: Payer: Self-pay | Admitting: Adult Health

## 2019-01-08 DIAGNOSIS — D4620 Refractory anemia with excess of blasts, unspecified: Secondary | ICD-10-CM

## 2019-01-08 DIAGNOSIS — F5101 Primary insomnia: Secondary | ICD-10-CM

## 2019-01-17 ENCOUNTER — Other Ambulatory Visit: Payer: Self-pay | Admitting: Cardiology

## 2019-01-17 DIAGNOSIS — I471 Supraventricular tachycardia: Secondary | ICD-10-CM

## 2019-01-18 IMAGING — CR DG HIP (WITH OR WITHOUT PELVIS) 2-3V*R*
3 series · 3 of 3 positions shown · non-contrast
Comparison: None

CLINICAL DATA: RIGHT hip and groin pain for 1 month, history breast
cancer

EXAM:
DG HIP (WITH OR WITHOUT PELVIS) 2-3V RIGHT

[t pelvis a.p.]
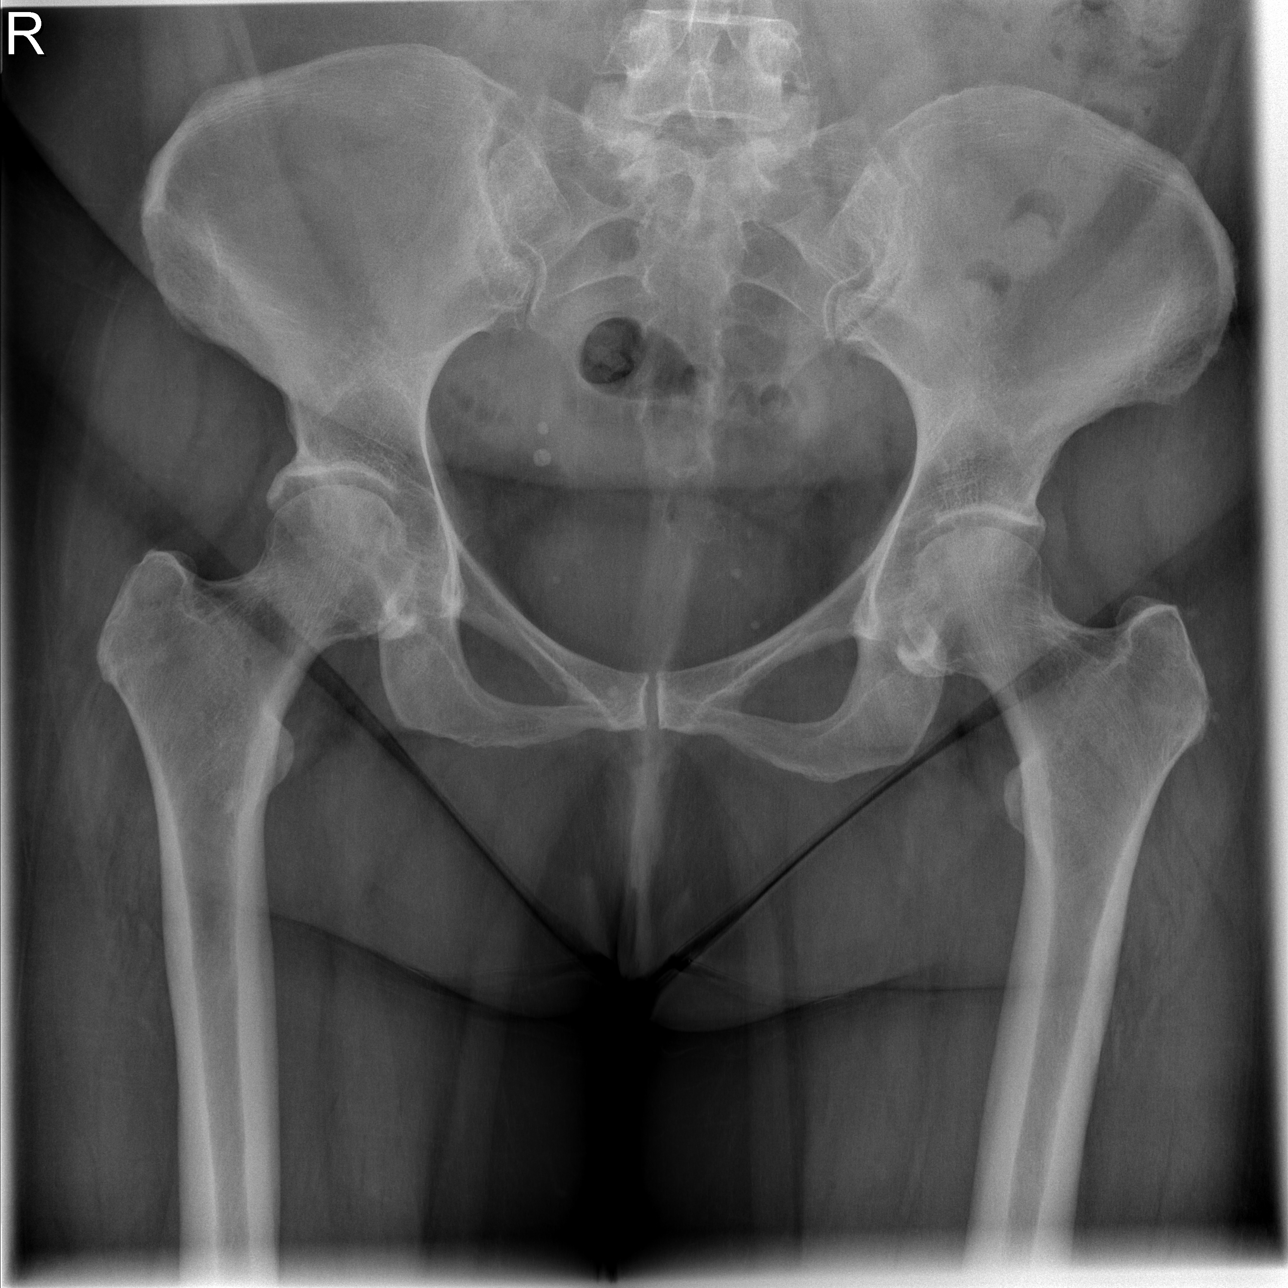

[t hip ap right *]
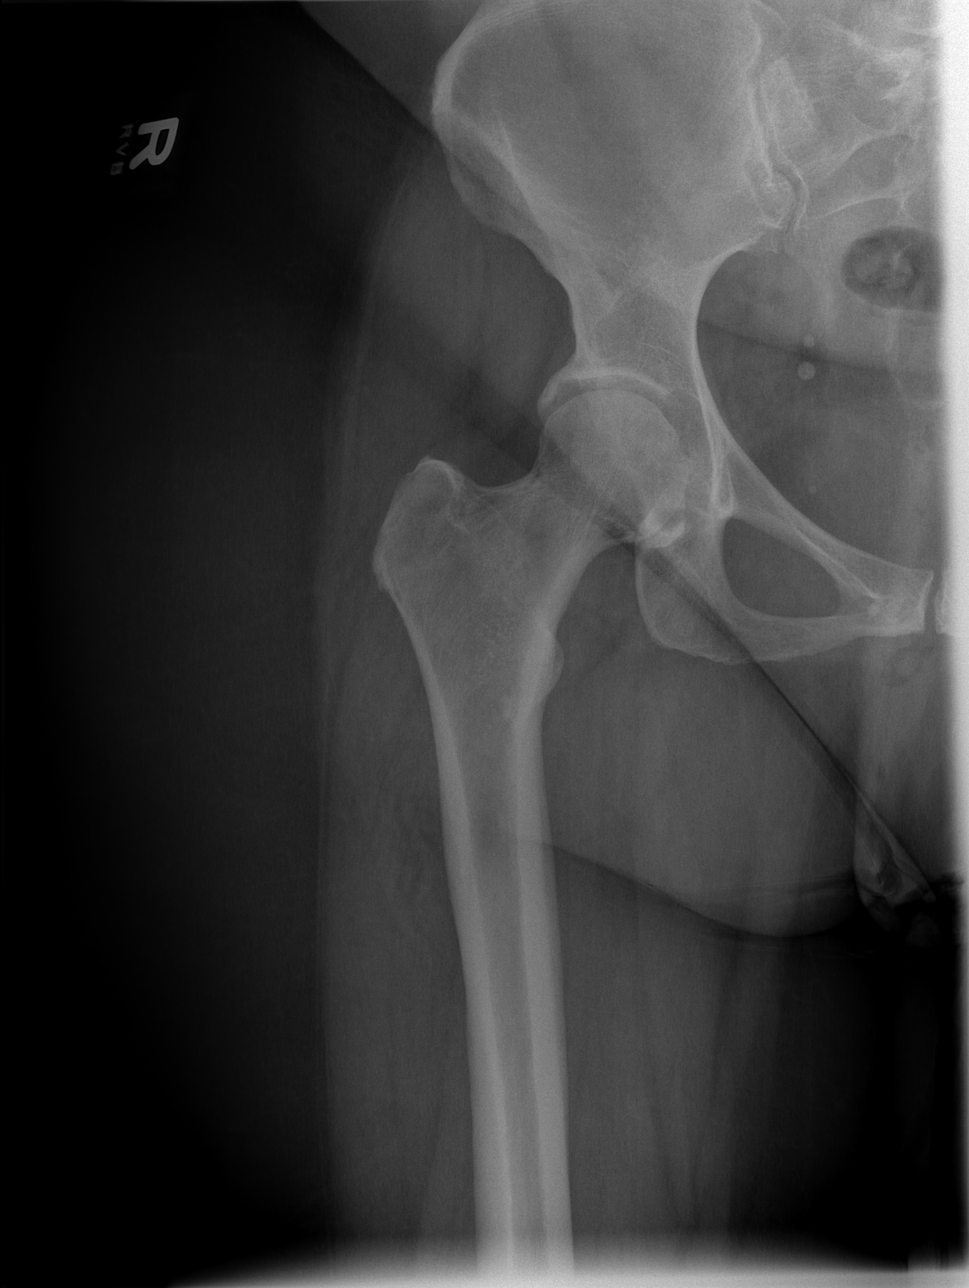

[t hip frog leg right *]
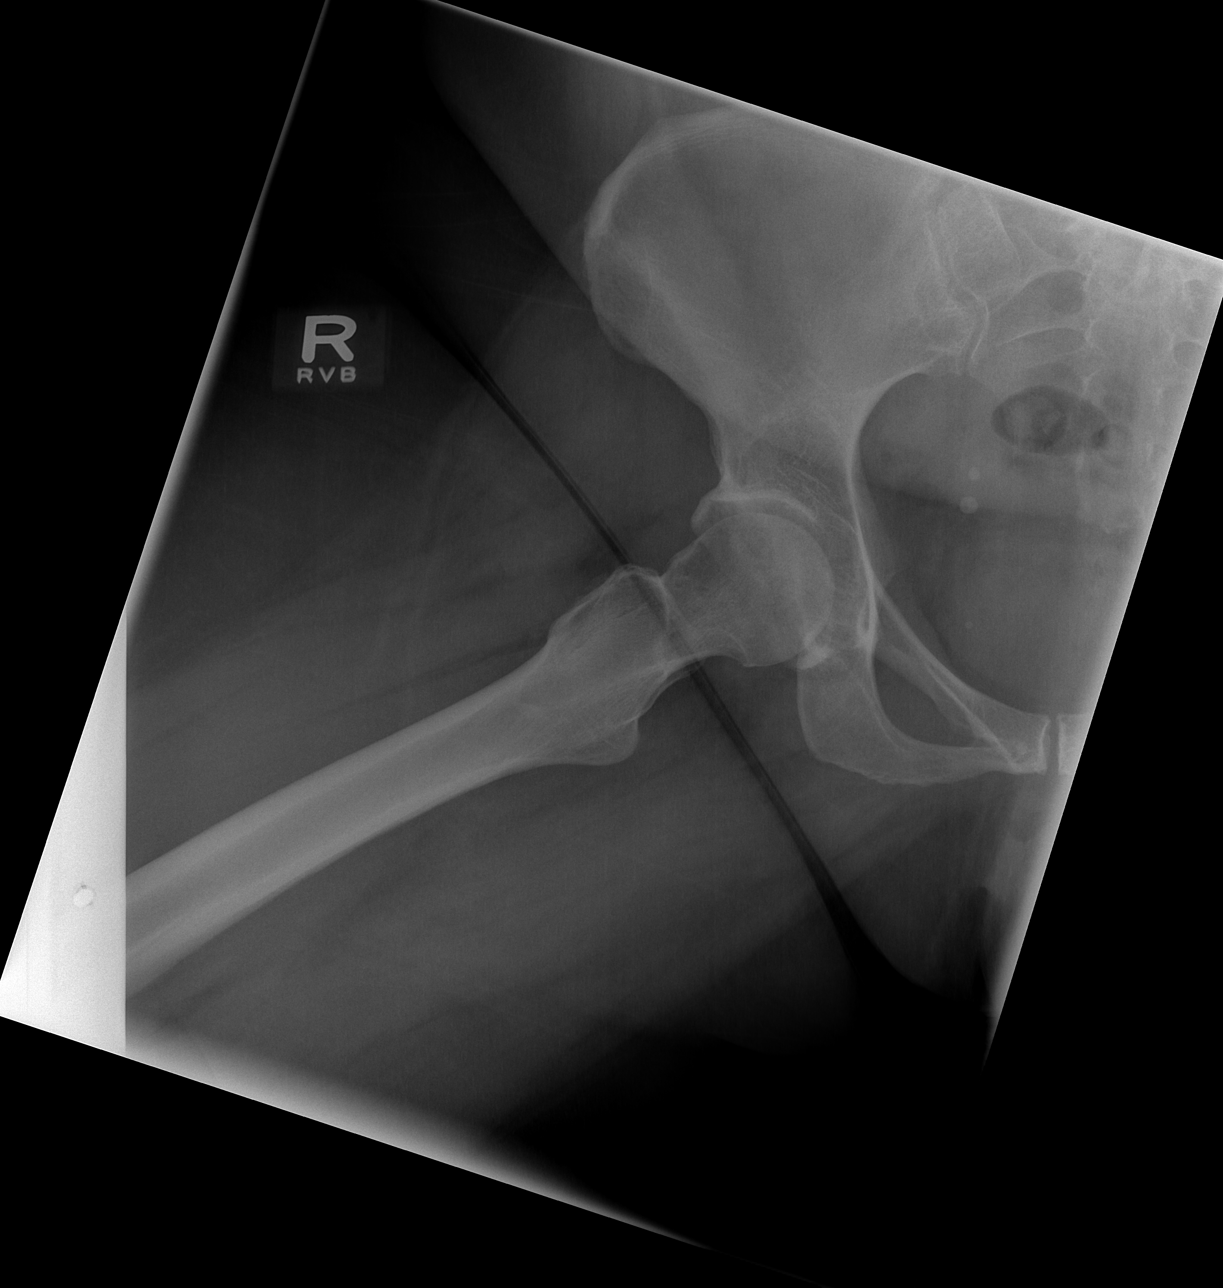

[3 of 3 positions shown; findings below may reference images not displayed]

FINDINGS: Osseous mineralization normal.

Hip and SI joint spaces preserved.

Questionable tiny focus of sclerosis at the proximal RIGHT femoral
metaphysis at the level of the inferior margin of the lesser
trochanter, asymmetric appearance versus proximal LEFT femur.

No acute fracture, dislocation or bone destruction.

Scattered pelvic phleboliths.
IMPRESSION: Questionable subtle area of sclerosis at the proximal RIGHT femur
versus artifact; subtle sclerotic osseous metastasis is not excluded
and further assessment by radionuclide bone scintigraphy
recommended.

## 2019-01-27 ENCOUNTER — Other Ambulatory Visit: Payer: Self-pay | Admitting: Cardiology

## 2019-01-27 DIAGNOSIS — I471 Supraventricular tachycardia: Secondary | ICD-10-CM

## 2019-02-07 ENCOUNTER — Other Ambulatory Visit: Payer: Self-pay | Admitting: Cardiology

## 2019-02-07 DIAGNOSIS — I471 Supraventricular tachycardia: Secondary | ICD-10-CM

## 2019-02-08 DIAGNOSIS — M25551 Pain in right hip: Secondary | ICD-10-CM | POA: Diagnosis not present

## 2019-03-07 ENCOUNTER — Other Ambulatory Visit: Payer: Self-pay | Admitting: Hematology and Oncology

## 2019-03-26 NOTE — Progress Notes (Signed)
Patient Care Team: Lawerance Cruel, MD as PCP - General (Family Medicine) Bo Merino, MD as Consulting Physician (Rheumatology)  DIAGNOSIS:    ICD-10-CM   1. Malignant neoplasm of upper-inner quadrant of left breast in female, estrogen receptor positive ([place])  C50.212 MR HIP RIGHT W WO CONTRAST   Z17.0   2. MDS (myelodysplastic syndrome), low grade ([facility])  D46.20   3. Primary insomnia  F51.01 zolpidem (AMBIEN CR) 12.5 MG CR tablet  4. Myelodysplastic syndrome, low grade ([facility])  D46.20 zolpidem (AMBIEN CR) 12.5 MG CR tablet    MR HIP RIGHT W WO CONTRAST    SUMMARY OF ONCOLOGIC HISTORY: Oncology History  Breast cancer of upper-inner quadrant of left female breast ([place])  12/31/2011 - 03/11/2012 Neo-Adjuvant Chemotherapy   Taxotere Herceptin Perjeta 4 followed by Herceptin maintenance completed 01/29/2013   04/06/2012 Surgery   Left breast mastectomy: IDC grade 1; 0.8 cm, low-grade DCIS, LV I identified, 1/2 lymph node Micro met, ER/PR positive HER-2 positive Ki-67 94%   05/04/2012 - 06/20/2012 Radiation Therapy   Adjuvant radiation therapy   07/29/2012 -  Anti-estrogen oral therapy   Arimidex 1 mg daily   10/17/2014 Procedure   Bone marrow biopsy for thrombocytopenia and leukopenia: Hypercellular marrow with trilineage dysplasia most apparent in megakaryocytes with associated myelofibrosis: low-grade MDS with refractory cytopenias and multilineage dysplasia (20q- partial del)   MDS (myelodysplastic syndrome), low grade ([place])  10/17/2014 Initial Diagnosis   MDS (myelodysplastic syndrome), low grade , refractory cytopenia with multi nature dysplasia cytogenetics -20q (associated with good prognosis MDS)     CHIEF COMPLIANT: Follow-up of left breast cancer on anastrozole and MDS  INTERVAL HISTORY: Marie Anderson is a 74 y.o. with above-mentioned history of left breast cancer who underwent neoadjuvant chemotherapy, left mastectomy, radiation, and is currently on anastrozole.  She also has a history of MDS. She presents to the clinic today for follow-up.  She does not have any infections or any other concerns.  Denies any side effects to anastrozole.  ALLERGIES:  is allergic to cephalexin; cephalosporins; codeine; and tape.  MEDICATIONS:  Current Outpatient Medications  Medication Sig Dispense Refill  . anastrozole (ARIMIDEX) 1 MG tablet Take 1 tablet (1 mg total) by mouth daily. 90 tablet 3  . Calcium Carbonate-Vit D-Min (CALCIUM 1200 PO) Take 1 tablet by mouth every morning.    . cholecalciferol (VITAMIN D) 1000 UNITS tablet Take by mouth daily.     . cyanocobalamin 100 MCG tablet Take 100 mcg by mouth daily.    . fluocinonide-emollient (LIDEX-E) 0.05 % cream Apply 1 application topically daily as needed (welps and rash.).     Marland Kitchen gabapentin (NEURONTIN) 600 MG tablet Take 600 mg by mouth 2 (two) times daily.    . metoprolol succinate (TOPROL-XL) 100 MG 24 hr tablet Take 1 tablet (100 mg total) by mouth daily. 90 tablet 0  . Pseudoephedrine-APAP-DM (DAYQUIL MULTI-SYMPTOM PO) Take 1 tablet by mouth daily as needed (sinus.).    Marland Kitchen vitamin C (ASCORBIC ACID) 500 MG tablet Take 500 mg by mouth every morning.     . vitamin E 200 UNIT capsule Take 200 Units by mouth every morning.     . zolpidem (AMBIEN CR) 12.5 MG CR tablet Take 1 tablet (12.5 mg total) by mouth at bedtime as needed. for sleep 30 tablet 3   No current facility-administered medications for this visit.    PHYSICAL EXAMINATION: ECOG PERFORMANCE STATUS: 1 - Symptomatic but completely ambulatory  Vitals:   03/27/19  0852  BP: (!) 161/81  Pulse: 77  Resp: 17  Temp: 98.3 F (36.8 C)  SpO2: 97%   Filed Weights   03/27/19 0852  Weight: 210 lb 6.4 oz (95.4 kg)    BREAST: No palpable masses or nodules in either right or left breasts. No palpable axillary supraclavicular or infraclavicular adenopathy no breast tenderness or nipple discharge. (exam performed in the presence of a chaperone)  LABORATORY  DATA:  I have reviewed the data as listed CMP Latest Ref Rng & Units 03/27/2019 12/23/2018 09/22/2018  Glucose 70 - 99 mg/dL 101(H) 95 94  BUN 8 - 23 mg/dL _0 Creatinine 0.44 - 1.00 mg/dL 0.78 0.80 0.77  Sodium 135 - 145 mmol/L 143 142 143  Potassium 3.5 - 5.1 mmol/L 3.8 4.2 4.1  Chloride 98 - 111 mmol/L 107 108 108  CO2 22 - 32 mmol/L _1 Calcium 8.9 - 10.3 mg/dL 8.7(L) 8.5(L) 8.9  Total Protein 6.5 - 8.1 g/dL 6.6 7.1 7.1  Total Bilirubin 0.3 - 1.2 mg/dL 0.6 0.6 0.6  Alkaline Phos 38 - 126 U/L 71 69 77  AST 15 - 41 U/L _2 ALT 0 - 44 U/L _3 Lab Results  Component Value Date   WBC 2.6 (L) 03/27/2019   HGB 14.1 03/27/2019   HCT 42.3 03/27/2019   MCV 97.5 03/27/2019   PLT 50 (L) 03/27/2019   NEUTROABS 1.5 (L) 03/27/2019    ASSESSMENT & PLAN:  Breast cancer of upper-inner quadrant of left female breast Left breast invasive ductal carcinoma ER/PR positive HER-2 negative Ki-67 94% diagnosed October 2013 status post 4 cycles of [place] followed by left mastectomy which revealed a 0.8 cm grade 1 IDC, 1 sentinel node micrometastatic disease ER/PR 100% positive status post radiation therapy and completed adjuvant trastuzumab 01/12/2013 currently on Arimidex 1 mg daily from 07/29/2012  Arimidex toxicities: 1.  Mild hot flashes 2.  Mild myalgias arthralgias She will complete 7 years of therapy by May 2021.  I discussed with her that at that time she can stop the antiestrogen therapy.  Breast cancer surveillance:  Right breast mammograms: Being done by Dr. Bennye Alm  MDS (myelodysplastic syndrome), low grade Lab review: WBC 2.6, platelets 50, ANC 1.5 We discussed that the white count can fluctuate up and down and that does not indicate any change in her prognosis. Monitoring labs every 3 months and then follow-ups every 6 months    Orders Placed This Encounter  Procedures  . MR HIP RIGHT W WO CONTRAST    Standing Status:   Future    Standing  Expiration Date:   05/24/2020    Order Specific Question:   ** REASON FOR EXAM (FREE TEXT)    Answer:   Bone marrow uptake seen a year ago. Follow up on that    Order Specific Question:   If indicated for the ordered procedure, I authorize the administration of contrast media per Radiology protocol    Answer:   Yes    Order Specific Question:   What is the patient's sedation requirement?    Answer:   No Sedation    Order Specific Question:   Does the patient have a pacemaker or implanted devices?    Answer:   Yes    Order Specific Question:   Radiology Contrast Protocol - do NOT remove file path    Answer:   _4 [email or web address]    Order Specific Question:  Preferred imaging location?    Answer:   [facility] (table limit-550lbs)   The patient has a good understanding of the overall plan. she agrees with it. she will call with any problems that may develop before the next visit here.  Total time spent: 15 mins including face to face time and time spent for planning, charting and coordination of care  Gudena, Vinay, MD 03/27/2019  I, Molly Dorshimer, am acting as scribe for Dr. Vinay Gudena.  I have reviewed the above documentation for accuracy and completeness, and I agree with the above.       

## 2019-03-27 ENCOUNTER — Other Ambulatory Visit: Payer: Self-pay

## 2019-03-27 ENCOUNTER — Inpatient Hospital Stay: Payer: Medicare Other | Attending: Hematology and Oncology

## 2019-03-27 ENCOUNTER — Inpatient Hospital Stay (HOSPITAL_BASED_OUTPATIENT_CLINIC_OR_DEPARTMENT_OTHER): Payer: Medicare Other | Admitting: Hematology and Oncology

## 2019-03-27 DIAGNOSIS — Z923 Personal history of irradiation: Secondary | ICD-10-CM | POA: Diagnosis not present

## 2019-03-27 DIAGNOSIS — C50212 Malignant neoplasm of upper-inner quadrant of left female breast: Secondary | ICD-10-CM

## 2019-03-27 DIAGNOSIS — Z170 Estrogen receptor positive status [ER+]: Secondary | ICD-10-CM

## 2019-03-27 DIAGNOSIS — D4620 Refractory anemia with excess of blasts, unspecified: Secondary | ICD-10-CM

## 2019-03-27 DIAGNOSIS — F5101 Primary insomnia: Secondary | ICD-10-CM

## 2019-03-27 DIAGNOSIS — Z79899 Other long term (current) drug therapy: Secondary | ICD-10-CM | POA: Diagnosis not present

## 2019-03-27 DIAGNOSIS — Z79811 Long term (current) use of aromatase inhibitors: Secondary | ICD-10-CM | POA: Diagnosis not present

## 2019-03-27 DIAGNOSIS — D469 Myelodysplastic syndrome, unspecified: Secondary | ICD-10-CM | POA: Diagnosis not present

## 2019-03-27 DIAGNOSIS — N951 Menopausal and female climacteric states: Secondary | ICD-10-CM | POA: Diagnosis not present

## 2019-03-27 DIAGNOSIS — Z9221 Personal history of antineoplastic chemotherapy: Secondary | ICD-10-CM | POA: Insufficient documentation

## 2019-03-27 LAB — CBC WITH DIFFERENTIAL (CANCER CENTER ONLY)
Abs Immature Granulocytes: 0.01 10*3/uL (ref 0.00–0.07)
Basophils Absolute: 0 10*3/uL (ref 0.0–0.1)
Basophils Relative: 0 %
Eosinophils Absolute: 0 10*3/uL (ref 0.0–0.5)
Eosinophils Relative: 1 %
HCT: 42.3 % (ref 36.0–46.0)
Hemoglobin: 14.1 g/dL (ref 12.0–15.0)
Immature Granulocytes: 0 %
Lymphocytes Relative: 32 %
Lymphs Abs: 0.8 10*3/uL (ref 0.7–4.0)
MCH: 32.5 pg (ref 26.0–34.0)
MCHC: 33.3 g/dL (ref 30.0–36.0)
MCV: 97.5 fL (ref 80.0–100.0)
Monocytes Absolute: 0.2 10*3/uL (ref 0.1–1.0)
Monocytes Relative: 8 %
Neutro Abs: 1.5 10*3/uL — ABNORMAL LOW (ref 1.7–7.7)
Neutrophils Relative %: 59 %
Platelet Count: 50 10*3/uL — ABNORMAL LOW (ref 150–400)
RBC: 4.34 MIL/uL (ref 3.87–5.11)
RDW: 13.9 % (ref 11.5–15.5)
WBC Count: 2.6 10*3/uL — ABNORMAL LOW (ref 4.0–10.5)
nRBC: 0 % (ref 0.0–0.2)

## 2019-03-27 LAB — CMP (CANCER CENTER ONLY)
ALT: 15 U/L (ref 0–44)
AST: 18 U/L (ref 15–41)
Albumin: 3.8 g/dL (ref 3.5–5.0)
Alkaline Phosphatase: 71 U/L (ref 38–126)
Anion gap: 9 (ref 5–15)
BUN: 13 mg/dL (ref 8–23)
CO2: 27 mmol/L (ref 22–32)
Calcium: 8.7 mg/dL — ABNORMAL LOW (ref 8.9–10.3)
Chloride: 107 mmol/L (ref 98–111)
Creatinine: 0.78 mg/dL (ref 0.44–1.00)
GFR, Est AFR Am: >60 mL/min (ref >60)
GFR, Estimated: >60 mL/min (ref >60)
Glucose, Bld: 101 mg/dL — ABNORMAL HIGH (ref 70–99)
Potassium: 3.8 mmol/L (ref 3.5–5.1)
Sodium: 143 mmol/L (ref 135–145)
Total Bilirubin: 0.6 mg/dL (ref 0.3–1.2)
Total Protein: 6.6 g/dL (ref 6.5–8.1)

## 2019-03-27 MED ORDER — ZOLPIDEM TARTRATE ER 12.5 MG PO TBCR: 12.5000 mg | EXTENDED_RELEASE_TABLET | Freq: Every evening | ORAL | 3 refills | Status: DC | PRN

## 2019-03-27 MED ORDER — ANASTROZOLE 1 MG PO TABS: 1.0000 mg | ORAL_TABLET | Freq: Every day | ORAL | 3 refills | Status: DC

## 2019-03-27 NOTE — Assessment & Plan Note (Signed)
Lab review: Monitoring labs every 3 months and then follow-ups every 6 months

## 2019-03-27 NOTE — Assessment & Plan Note (Signed)
Left breast invasive ductal carcinoma ER/PR positive HER-2 negative Ki-67 94% diagnosed October 2013 status post 4 cycles of [place] followed by left mastectomy which revealed a 0.8 cm grade 1 IDC, 1 sentinel node micrometastatic disease ER/PR 100% positive status post radiation therapy and completed adjuvant trastuzumab 01/12/2013 currently on Arimidex 1 mg daily from 07/29/2012  Arimidex toxicities: 1.  Mild hot flashes 2.  Mild myalgias arthralgias She will complete 7 years of therapy by May 2021.  I discussed with her that at that time she can stop the antiestrogen therapy.  Breast cancer surveillance:  ight breast mammograms: Being done by Dr. Bennye Alm  Return to clinic in 3 months for follow-up (patient wishes to be seen every 3 months)

## 2019-03-28 ENCOUNTER — Telehealth: Payer: Self-pay | Admitting: Hematology and Oncology

## 2019-03-28 NOTE — Telephone Encounter (Signed)
Scheduled per 1/25 los. Called and spoke with pt, confirmed 2/1, 4/26, and 7/26 appt

## 2019-03-29 NOTE — Progress Notes (Signed)
Marie Anderson Date of Birth: Oct 03, 1945 Medical Record [id number]  History of Present Illness: Marie Anderson is seen for  followup SVT. She has a history of supraventricular tachycardia that has been well controlled with metoprolol. She also has a history of breast cancer. She had a left mastectomy with reconstruction. She was treated with Herceptin. Serial Echos have been normal. Previously she had an episode of SVT requiring Adenosine in November 2013. In July 2015 she had an episode that responded to Valsalva maneuver.   She was seen in the ED in [place] on July 31, 2016 with SVT rate 185. Converted with IV adenosine. Labs revealed plts 77K. Otherwise ok. Troponin negative.   Since does have a history of myelodysplasia syndrome.  Thrombocytopenia and leukopenia.  She also has been diagnosed with Sjogren's syndrome and antiphospholipid syndrome.    On follow up today she is feeling very well. She denies any recurrent SVT. States the last episode of SVT she had was over 2 years ago. Rarely uses Dayquil with pseudofed. No bleeding. No recurrent breast CA.    Current Outpatient Medications on File Prior to Visit  Medication Sig Dispense Refill  . anastrozole (ARIMIDEX) 1 MG tablet Take 1 tablet (1 mg total) by mouth daily. 90 tablet 3  . Calcium Carbonate-Vit D-Min (CALCIUM 1200 PO) Take 1 tablet by mouth every morning.    . cholecalciferol (VITAMIN D) 1000 UNITS tablet Take by mouth daily.     . cyanocobalamin 100 MCG tablet Take 100 mcg by mouth daily.    . fluocinonide-emollient (LIDEX-E) 0.05 % cream Apply 1 application topically daily as needed (welps and rash.).     Marland Kitchen gabapentin (NEURONTIN) 600 MG tablet Take 600 mg by mouth 2 (two) times daily.    . vitamin C (ASCORBIC ACID) 500 MG tablet Take 500 mg by mouth every morning.     . vitamin E 200 UNIT capsule Take 200 Units by mouth every morning.     . zolpidem (AMBIEN CR) 12.5 MG CR tablet Take 1 tablet (12.5 mg total) by mouth at  bedtime as needed. for sleep 30 tablet 3   No current facility-administered medications on file prior to visit.    Allergies  Allergen Reactions  . Cephalexin   . Cephalosporins     headache  . Codeine Nausea Only  . Tape Other (See Comments)    Sensitivity to bandaids and tape that cause blisters and redness    Past Medical History:  Diagnosis Date  . Anemia   . Antiphospholipid antibody syndrome ([place])   . Breast cancer ([facility]) 12/04/11   left- inv ductal ca, DCIS, ER/PR +, HER 2 +  . Family history of breast cancer   . Family history of ovarian cancer   . Family history of pancreatic cancer   . History of chemotherapy 12/31/11 -03/11/12   s/p 4 cycles  . History of radiation therapy 05/04/12-06/20/12   left breast/  . Myelodysplasia   . Neuropathy   . PONV (postoperative nausea and vomiting)   . Skin cancer    Melanoma at 15  . SVT (supraventricular tachycardia) (HCC)     Past Surgical History:  Procedure Laterality Date  . AUGMENTATION MAMMAPLASTY Bilateral    2014 with reduction on right  . BREAST RECONSTRUCTION WITH PLACEMENT OF TISSUE EXPANDER AND FLEX HD (ACELLULAR HYDRATED DERMIS)  04/06/2012   Procedure: BREAST RECONSTRUCTION WITH PLACEMENT OF TISSUE EXPANDER AND FLEX HD (ACELLULAR HYDRATED DERMIS);  Surgeon: Theodoro Kos, DO;  Location:  [facility];  Service: Plastics;  Laterality: Left;  IMMEDIATE LEFT BREAST RECONSTRUCTION WITH PLACEMENT OF TISSUE EXPANDER AND ALLODERM  . COLONOSCOPY    . DILATION AND CURETTAGE OF UTERUS    . EYE SURGERY     age 64-lt eye growth  . LIPOSUCTION WITH LIPOFILLING N/A 01/19/2013   Procedure: LIPOSUCTION WITH LIPOFILLING;  Surgeon: Theodoro Kos, DO;  Location: [facility];  Service: Plastics;  Laterality: N/A;  . MASTECTOMY Left    2014  . MASTECTOMY W/ SENTINEL NODE BIOPSY  04/06/2012   Procedure: MASTECTOMY WITH SENTINEL LYMPH NODE BIOPSY;  Surgeon: Rolm Bookbinder, MD;  Location: [facility];  Service: General;  Laterality: Left;  left mastectomy, left axillary sentinel node biopsy  . MASTOPEXY Right 01/19/2013   Procedure: RIGHT BREAST PLACEMENT OF IMPLANT WITH MASTOPEXY;  Surgeon: Theodoro Kos, DO;  Location: [facility];  Service: Plastics;  Laterality: Right;  . PORT-A-CATH REMOVAL Right 01/19/2013   Procedure: REMOVAL PORT-A-CATH;  Surgeon: Rolm Bookbinder, MD;  Location: [facility];  Service: General;  Laterality: Right;  . PORTACATH PLACEMENT  12/23/2011   Procedure: INSERTION PORT-A-CATH;  Surgeon: Rolm Bookbinder, MD;  Location: [facility];  Service: General;  Laterality: Right;  . REMOVAL OF TISSUE EXPANDER AND PLACEMENT OF IMPLANT Left 01/19/2013   Procedure: REMOVAL OF LEFT TISSUE EXPANDERS WITH PLACEMENT OF LEFT BREAST IMPLANT;  Surgeon: Theodoro Kos, DO;  Location: [facility];  Service: Plastics;  Laterality: Left;    Social History   Tobacco Use  Smoking Status Never Smoker  Smokeless Tobacco Never Used    Social History   Substance and Sexual Activity  Alcohol Use Yes    Family History  Problem Relation Age of Onset  . Stroke Mother   . Cancer Father        prostate  . Kidney cancer Maternal Uncle   . Cancer Paternal Uncle        ? bone cancer  . Stroke Maternal Grandmother   . Heart attack Maternal Grandfather   . Pancreatic cancer Cousin 19       maternal first cousin - mother is identical twin to pateint's mother  . Cancer Cousin        unknown cancer in maternal first cousin  . Ovarian cancer Cousin   . Pancreatic cancer Cousin 63    Review of Systems: As noted in history of present illness.  All other systems were reviewed and are negative.  Physical Exam: BP 136/79   Pulse 83   Temp (!) 97 F (36.1 C)   Ht 5\' 5"  (1.651 m)   Wt 210 lb (95.3 kg)   SpO2 98%   BMI 34.95 kg/m  GENERAL:  Well appearing, obese WF in NAD HEENT:  PERRL, EOMI, sclera are clear.  Oropharynx is clear. NECK:  No jugular venous distention, carotid upstroke brisk and symmetric, no bruits, no thyromegaly or adenopathy LUNGS:  Clear to auscultation bilaterally CHEST:  Unremarkable HEART:  RRR,  PMI not displaced or sustained,S1 and S2 within normal limits, no S3, no S4: no clicks, no rubs, no murmurs ABD:  Soft, nontender. BS +, no masses or bruits. No hepatomegaly, no splenomegaly EXT:  2 + pulses throughout, no edema, no cyanosis no clubbing SKIN:  Warm and dry.  No rashes NEURO:  Alert and oriented x 3. Cranial nerves II through XII intact. PSYCH:  Cognitively intact    LABORATORY DATA:  Lab  Results  Component Value Date   WBC 2.6 (L) 03/27/2019   HGB 14.1 03/27/2019   HCT 42.3 03/27/2019   PLT 50 (L) 03/27/2019   GLUCOSE 101 (H) 03/27/2019   ALT 15 03/27/2019   AST 18 03/27/2019   NA 143 03/27/2019   K 3.8 03/27/2019   CL 107 03/27/2019   CREATININE 0.78 03/27/2019   BUN 13 03/27/2019   CO2 27 03/27/2019   INR 1.00 10/17/2014    Ecg today: NSR, rate 83. nonspecific ST abnormality. I have personally reviewed and interpreted this study.   Assessment / Plan: 1. Supraventricular tachycardia. Well controlled on metoprolol. Toprol refilled.  avoid stimulants use antihistamine only if needed. Follow up in one year.  2. Breast cancer status post mastectomy. s/p chemotherapy with Herceptin remotely. Echocardiogram showed normal LV function.   3. Myelodysplasia syndrome- with thrombocytopenia and neutropenia. Followed by oncology.  4. Sjogren's syndrome  5. Antiphospholipid syndrome. Follow by Rheumatology.  Follow up in one year

## 2019-03-30 DIAGNOSIS — Z23 Encounter for immunization: Secondary | ICD-10-CM | POA: Diagnosis not present

## 2019-04-03 ENCOUNTER — Inpatient Hospital Stay: Payer: Medicare Other

## 2019-04-03 ENCOUNTER — Encounter: Payer: Self-pay | Admitting: Genetic Counselor

## 2019-04-03 ENCOUNTER — Other Ambulatory Visit: Payer: Self-pay

## 2019-04-03 ENCOUNTER — Inpatient Hospital Stay: Payer: Medicare Other | Attending: Genetic Counselor | Admitting: Genetic Counselor

## 2019-04-03 DIAGNOSIS — D4620 Refractory anemia with excess of blasts, unspecified: Secondary | ICD-10-CM

## 2019-04-03 DIAGNOSIS — Z803 Family history of malignant neoplasm of breast: Secondary | ICD-10-CM

## 2019-04-03 DIAGNOSIS — C439 Malignant melanoma of skin, unspecified: Secondary | ICD-10-CM

## 2019-04-03 DIAGNOSIS — Z800 Family history of malignant neoplasm of digestive organs: Secondary | ICD-10-CM | POA: Insufficient documentation

## 2019-04-03 DIAGNOSIS — Z8041 Family history of malignant neoplasm of ovary: Secondary | ICD-10-CM

## 2019-04-03 NOTE — Progress Notes (Signed)
REFERRING PROVIDER: Nicholas Lose, MD [address] [place],  [place] [id number]  PRIMARY PROVIDER:  Lawerance Cruel, MD  PRIMARY REASON FOR VISIT:  1. MDS (myelodysplastic syndrome), low grade ([place])   2. Family history of pancreatic cancer   3. Family history of ovarian cancer   4. Family history of breast cancer   5. Melanoma of skin ([place])      HISTORY OF PRESENT ILLNESS:   Marie Anderson, a 74 y.o. female, was seen for a [place] [facility] cancer genetics consultation at the request of Dr. Lindi Adie due to a personal and family history of cancer.  Marie Anderson presents to clinic today to discuss the possibility of a hereditary predisposition to cancer, genetic testing, and to further clarify her future cancer risks, as well as potential cancer risks for family members.   In 2007, at the age of 25, Marie Anderson was diagnosed with melanoma of the lower right leg. The treatment plan [facility] surgery.  In 2013, at the age of 34, Marie Anderson was diagnosed with cancer of the left breast.  The tumor was ER+/PR+/Her2-.  This was treated by chemotherapy, left mastectomy and radiation.  Lastly, in 2016, at the age of 89, Marie Anderson was diagnosed with MDS.     CANCER HISTORY:  Oncology History  Breast cancer of upper-inner quadrant of left female breast ([facility])  12/31/2011 - 03/11/2012 Neo-Adjuvant Chemotherapy   Taxotere Herceptin Perjeta 4 followed by Herceptin maintenance completed 01/29/2013   04/06/2012 Surgery   Left breast mastectomy: IDC grade 1; 0.8 cm, low-grade DCIS, LV I identified, 1/2 lymph node Micro met, ER/PR positive HER-2 positive Ki-67 94%   05/04/2012 - 06/20/2012 Radiation Therapy   Adjuvant radiation therapy   07/29/2012 -  Anti-estrogen oral therapy   Arimidex 1 mg daily   10/17/2014 Procedure   Bone marrow biopsy for thrombocytopenia and leukopenia: Hypercellular marrow with trilineage dysplasia most apparent in megakaryocytes with associated myelofibrosis: low-grade MDS  with refractory cytopenias and multilineage dysplasia (20q- partial Anderson)   MDS (myelodysplastic syndrome), low grade ([facility])  10/17/2014 Initial Diagnosis   MDS (myelodysplastic syndrome), low grade , refractory cytopenia with multi nature dysplasia cytogenetics -20q (associated with good prognosis MDS)      RISK FACTORS:  Menarche was at age 46-13.  First live birth at age 85.  OCP use for approximately 10-12 years.  Ovaries intact: yes.  Hysterectomy: no.  Menopausal status: postmenopausal.  HRT use: 0 years. Colonoscopy: yes; normal. Mammogram within the last year: yes. Number of breast biopsies: 1. Up to date with pelvic exams: yes. Any excessive radiation exposure in the past: no  Past Medical History:  Diagnosis Date  . Anemia   . Antiphospholipid antibody syndrome ([facility])   . Breast cancer ([facility]) 12/04/11   left- inv ductal ca, DCIS, ER/PR +, HER 2 +  . Family history of breast cancer   . Family history of ovarian cancer   . Family history of pancreatic cancer   . History of chemotherapy 12/31/11 -03/11/12   s/p 4 cycles  . History of radiation therapy 05/04/12-06/20/12   left breast/  . Myelodysplasia   . Neuropathy   . PONV (postoperative nausea and vomiting)   . Skin cancer    Melanoma at 70  . SVT (supraventricular tachycardia) ([facility])     Past Surgical History:  Procedure Laterality Date  . AUGMENTATION MAMMAPLASTY Bilateral    2014 with reduction on right  . BREAST RECONSTRUCTION WITH PLACEMENT OF TISSUE EXPANDER AND  FLEX HD (ACELLULAR HYDRATED DERMIS)  04/06/2012   Procedure: BREAST RECONSTRUCTION WITH PLACEMENT OF TISSUE EXPANDER AND FLEX HD (ACELLULAR HYDRATED DERMIS);  Surgeon: Theodoro Kos, DO;  Location: [facility];  Service: Plastics;  Laterality: Left;  IMMEDIATE LEFT BREAST RECONSTRUCTION WITH PLACEMENT OF TISSUE EXPANDER AND ALLODERM  . COLONOSCOPY    . DILATION AND CURETTAGE OF UTERUS    . EYE SURGERY     age 56-lt eye growth  .  LIPOSUCTION WITH LIPOFILLING N/A 01/19/2013   Procedure: LIPOSUCTION WITH LIPOFILLING;  Surgeon: Theodoro Kos, DO;  Location: [facility];  Service: Plastics;  Laterality: N/A;  . MASTECTOMY Left    2014  . MASTECTOMY W/ SENTINEL NODE BIOPSY  04/06/2012   Procedure: MASTECTOMY WITH SENTINEL LYMPH NODE BIOPSY;  Surgeon: Rolm Bookbinder, MD;  Location: [facility];  Service: General;  Laterality: Left;  left mastectomy, left axillary sentinel node biopsy  . MASTOPEXY Right 01/19/2013   Procedure: RIGHT BREAST PLACEMENT OF IMPLANT WITH MASTOPEXY;  Surgeon: Theodoro Kos, DO;  Location: [facility];  Service: Plastics;  Laterality: Right;  . PORT-A-CATH REMOVAL Right 01/19/2013   Procedure: REMOVAL PORT-A-CATH;  Surgeon: Rolm Bookbinder, MD;  Location: [facility];  Service: General;  Laterality: Right;  . PORTACATH PLACEMENT  12/23/2011   Procedure: INSERTION PORT-A-CATH;  Surgeon: Rolm Bookbinder, MD;  Location: [facility];  Service: General;  Laterality: Right;  . REMOVAL OF TISSUE EXPANDER AND PLACEMENT OF IMPLANT Left 01/19/2013   Procedure: REMOVAL OF LEFT TISSUE EXPANDERS WITH PLACEMENT OF LEFT BREAST IMPLANT;  Surgeon: Theodoro Kos, DO;  Location: [facility];  Service: Plastics;  Laterality: Left;    Social History   Socioeconomic History  . Marital status: Married    Spouse name: Not on file  . Number of children: 2  . Years of education: Not on file  . Highest education level: Not on file  Occupational History  . Occupation: Engineer, manufacturing systems    Comment: retired  . Occupation: front office     Comment: [employer]  Tobacco Use  . Smoking status: Never Smoker  . Smokeless tobacco: Never Used  Substance and Sexual Activity  . Alcohol use: Yes  . Drug use: No  . Sexual activity: Yes  Other Topics Concern  . Not on file  Social History Narrative  . Not on file   Social  Determinants of Health   Financial Resource Strain:   . Difficulty of Paying Living Expenses: Not on file  Food Insecurity:   . Worried About Charity fundraiser in the Last Year: Not on file  . Ran Out of Food in the Last Year: Not on file  Transportation Needs:   . Lack of Transportation (Medical): Not on file  . Lack of Transportation (Non-Medical): Not on file  Physical Activity:   . Days of Exercise per Week: Not on file  . Minutes of Exercise per Session: Not on file  Stress:   . Feeling of Stress : Not on file  Social Connections:   . Frequency of Communication with Friends and Family: Not on file  . Frequency of Social Gatherings with Friends and Family: Not on file  . Attends Religious Services: Not on file  . Active Member of Clubs or Organizations: Not on file  . Attends Archivist Meetings: Not on file  . Marital Status: Not on file     FAMILY HISTORY:  We obtained a detailed, 4-generation family history.  Significant diagnoses are listed below: Family History  Problem Relation Age of Onset  . Stroke Mother   . Cancer Father        prostate  . Kidney cancer Maternal Uncle   . Cancer Paternal Uncle        ? bone cancer  . Stroke Maternal Grandmother   . Heart attack Maternal Grandfather   . Pancreatic cancer Cousin 37       maternal first cousin - mother is identical twin to pateint's mother  . Cancer Cousin        unknown cancer in maternal first cousin  . Ovarian cancer Cousin   . Pancreatic cancer Cousin 81    The patient had three children.  Her first daughter died soon after birth from a diaphragmatic hernia.  She is an only child.  Both parents are deceased.  The patient's mother died of a stroke at 50.  She had an identical twin sister who died suddenly at 13.  This sister had one son who was diagnosed with pancreatic cancer and was found to have a pathogenic variant in RAD50.  The patient's mother has another sister who had a son with an  unknown cancer.  There were four brothers. One brother had kidney cancer and another brother had two daughters - one had ovarian cancer and this daughter had a son with prostate cancer, and the other daughter had pancreatic cancer.  The maternal grandparents are deceased from stroke and heart attack.  The patient's father died of prostate cancer.  He had 3-4 brothers, one who had bone cancer and a sister who did not have cancer.  There is no other reported family history of cancer.  Marie Anderson is unaware of previous family history of genetic testing for hereditary cancer risks. Patient's maternal ancestors are of Caucasian descent, and paternal ancestors are of Caucasian descent. There is no reported Ashkenazi Jewish ancestry. There is no known consanguinity.  GENETIC COUNSELING ASSESSMENT: Marie Anderson is a 74 y.o. female with a personal and family history of cancer which is somewhat suggestive of a hereditary cancer syndrome and predisposition to cancer given the combination of cancer in the family and a known RAD50 familial variant. We, therefore, discussed and recommended the following at today's visit.   DISCUSSION: We discussed that 5 - 10% of breast cancer is hereditary, with most cases associated with BRCA mutations.  There are other genes that can be associated with hereditary breast cancer syndromes.  Due to this family history, Marie Anderson underwent genetic testing at her OB/GYN office in February 2020 through [facility] that was negative. The [facility] gene panel offered by [employer] includes sequencing and deletion/duplication testing of the following 35 genes: APC, ATM, AXIN2, BARD1, BMPR1A, BRCA1, BRCA2, BRIP1, CHD1, CDK4, CDKN2A, CHEK2, EPCAM (large rearrangement only), HOXB13, (sequencing only), GALNT12, MLH1, MSH2, MSH3 (excluding repetitive portions of exon 1), MSH6, MUTYH, NBN, NTHL1, PALB2, PMS2, PTEN, RAD51C, RAD51D, RNF43, RPS20, SMAD4, STK11, and TP53. Sequencing  was performed for select regions of POLE and POLD1, and large rearrangement analysis was performed for select regions of GREM1.     In October, after a diagnosis of pancreatic cancer, the patient's maternal cousin underwent genetic testing.  He had an 85 gene test performed through [facility], and was found to have a RAD50 pathogenic variant and a SMARCA4 VUS.  Neither gene was on the [facility] panel that was performed on Marie Anderson earlier  in the year.    We discussed that RAD50 is a gene that has been associated with an increased risk for breast cancer.  Despite having an increased risk for breast cancer, it is not well established on how high that risk is.  At this time, we would not change her medical management if she were positive for the RAD50 pathogenic variant. However, knowing if the variant is present is important.  In the next few years we anticipate that we could learn a lot more about many of the genes that we are currently testing.  If there are medical management changes that come in the future due to carrying a pathogenic variant, knowing who has a pathogenic variant allows [place] to contact her to let her know.    We discussed her risk for having this variant is higher than would typically be considered for first cousins since her mother and her cousin's mother were identical twins.  Biologically, this makes she and her cousin more like half siblings.  Therefore, Marie Anderson has a 25% chance of having the RAD50 pathogenic variant found in her cousin  We can offer genetic testing to Marie Anderson to determine if she has this pathogenic variant, however, we would need to do a skin biopsy in order to do testing.  We would need to do this due to her diagnosis of MDS and the fact that genetic testing is not as accurate using blood/saliva in patients with MDS.  She reports that she had a skin biopsy for her [facility] testing as well.    We also discussed that [facility] does not test for all of  the melanoma or kidney cancer genes.  There is a gene, MITF that is associated with an increased risk for both melanoma and kidney cancer that had not been tested for, in addition to other melanoma and kidney cancer genes. If she wanted to undergo genetic testing for these genes, we could order additional testing.  Lastly, we discussed the cost of testing.  Her cousin underwent genetic testing through a laboratory that offers free testing for 90 days after genetic testing (although during COVID that time frame has been extended to 150 days).  Due to her MDS, we would need to do a skin biopsy and therefore there would be a cost to growing skin fibroblasts.  If she decides to undergo genetic testing for the RAD50 gene and undergo a second skin biopsy, we should probably consider doing additional testing for melanoma and kidney cancer genes.  However, doing additional testing would not be covered by insurance because she has used up her 'one' genetic test by the testing in February. The cost of testing out of pocket is $250.  We discussed that we could offer testing to her daughter.  The complementary testing earned by her cousin's testing can extend to her children.  Her children can undergo genetic testing through May 02, 2019.  PLAN: Marie Anderson did not wish to pursue genetic testing at today's visit. We understand this decision and remain available to coordinate genetic testing at any time in the future. We, therefore, recommend Marie Anderson continue to follow the cancer screening guidelines given by her primary healthcare provider.   Based on Marie Anderson's family history, we recommended her children have genetic counseling and testing. Marie Anderson will let us know if we can be of any assistance in coordinating genetic counseling and/or testing for this family member.   Lastly, we encouraged Marie Anderson to remain  in contact with cancer genetics annually so that we can continuously update the family  history and inform her of any changes in cancer genetics and testing that may be of benefit for this family.   Marie Anderson questions were answered to her satisfaction today. Our contact information was provided should additional questions or concerns arise. Thank you for the referral and allowing [place] to share in the care of your patient.   Jamaree Hosier P. [place], [place], [facility] Licensed, Insurance risk surveyor [email or web address] phone: [phone]  The patient was seen for a total of 55 minutes in face-to-face genetic counseling.  This patient was discussed with Drs. Anderson, Lindi Adie and/or Burr Medico who agrees with the above.    _______________________________________________________________________ For Office Staff:  Number of people involved in session: 1 Was an Intern/ student involved with case: no

## 2019-04-07 ENCOUNTER — Other Ambulatory Visit: Payer: Self-pay

## 2019-04-07 ENCOUNTER — Ambulatory Visit (INDEPENDENT_AMBULATORY_CARE_PROVIDER_SITE_OTHER): Payer: Medicare Other | Admitting: Cardiology

## 2019-04-07 ENCOUNTER — Encounter: Payer: Self-pay | Admitting: Cardiology

## 2019-04-07 VITALS — BP 136/79 | HR 83 | Temp 97.0°F | Ht 65.0 in | Wt 210.0 lb

## 2019-04-07 DIAGNOSIS — I471 Supraventricular tachycardia: Secondary | ICD-10-CM | POA: Diagnosis not present

## 2019-04-07 MED ORDER — METOPROLOL SUCCINATE ER 100 MG PO TB24: 100.0000 mg | ORAL_TABLET | Freq: Every day | ORAL | 3 refills | Status: DC

## 2019-04-25 DIAGNOSIS — Z23 Encounter for immunization: Secondary | ICD-10-CM | POA: Diagnosis not present

## 2019-05-02 ENCOUNTER — Other Ambulatory Visit: Payer: Medicare Other

## 2019-05-25 ENCOUNTER — Other Ambulatory Visit: Payer: Self-pay

## 2019-05-25 ENCOUNTER — Ambulatory Visit
Admission: RE | Admit: 2019-05-25 | Discharge: 2019-05-25 | Disposition: A | Discharge status: Home / Self Care | Payer: Medicare Other | Source: Ambulatory Visit | Attending: Hematology and Oncology | Admitting: Hematology and Oncology

## 2019-05-25 DIAGNOSIS — C50212 Malignant neoplasm of upper-inner quadrant of left female breast: Secondary | ICD-10-CM

## 2019-05-25 DIAGNOSIS — M25451 Effusion, right hip: Secondary | ICD-10-CM | POA: Diagnosis not present

## 2019-05-25 DIAGNOSIS — Z170 Estrogen receptor positive status [ER+]: Secondary | ICD-10-CM

## 2019-05-25 DIAGNOSIS — D4620 Refractory anemia with excess of blasts, unspecified: Secondary | ICD-10-CM

## 2019-05-25 MED ORDER — GADOBENATE DIMEGLUMINE 529 MG/ML IV SOLN
20.0000 mL | Freq: Once | INTRAVENOUS | Status: AC | PRN
  Administered 2019-05-25: 20 mL via INTRAVENOUS

## 2019-06-23 ENCOUNTER — Inpatient Hospital Stay: Payer: Medicare Other | Attending: Genetic Counselor

## 2019-06-23 ENCOUNTER — Other Ambulatory Visit: Payer: Self-pay

## 2019-06-23 ENCOUNTER — Telehealth: Payer: Self-pay

## 2019-06-23 DIAGNOSIS — C50212 Malignant neoplasm of upper-inner quadrant of left female breast: Secondary | ICD-10-CM

## 2019-06-23 DIAGNOSIS — Z170 Estrogen receptor positive status [ER+]: Secondary | ICD-10-CM | POA: Insufficient documentation

## 2019-06-23 LAB — CBC WITH DIFFERENTIAL (CANCER CENTER ONLY)
Abs Immature Granulocytes: 0.01 10*3/uL (ref 0.00–0.07)
Basophils Absolute: 0 10*3/uL (ref 0.0–0.1)
Basophils Relative: 1 %
Eosinophils Absolute: 0 10*3/uL (ref 0.0–0.5)
Eosinophils Relative: 1 %
HCT: 43 % (ref 36.0–46.0)
Hemoglobin: 14 g/dL (ref 12.0–15.0)
Immature Granulocytes: 0 %
Lymphocytes Relative: 28 %
Lymphs Abs: 0.8 10*3/uL (ref 0.7–4.0)
MCH: 32.3 pg (ref 26.0–34.0)
MCHC: 32.6 g/dL (ref 30.0–36.0)
MCV: 99.3 fL (ref 80.0–100.0)
Monocytes Absolute: 0.3 10*3/uL (ref 0.1–1.0)
Monocytes Relative: 9 %
Neutro Abs: 1.7 10*3/uL (ref 1.7–7.7)
Neutrophils Relative %: 61 %
Platelet Count: 45 10*3/uL — ABNORMAL LOW (ref 150–400)
RBC: 4.33 MIL/uL (ref 3.87–5.11)
RDW: 13.7 % (ref 11.5–15.5)
WBC Count: 2.9 10*3/uL — ABNORMAL LOW (ref 4.0–10.5)
nRBC: 0 % (ref 0.0–0.2)

## 2019-06-23 LAB — CMP (CANCER CENTER ONLY)
ALT: 17 U/L (ref 0–44)
AST: 20 U/L (ref 15–41)
Albumin: 3.9 g/dL (ref 3.5–5.0)
Alkaline Phosphatase: 74 U/L (ref 38–126)
Anion gap: 10 (ref 5–15)
BUN: 15 mg/dL (ref 8–23)
CO2: 28 mmol/L (ref 22–32)
Calcium: 8.7 mg/dL — ABNORMAL LOW (ref 8.9–10.3)
Chloride: 107 mmol/L (ref 98–111)
Creatinine: 0.81 mg/dL (ref 0.44–1.00)
GFR, Est AFR Am: >60 mL/min (ref >60)
GFR, Estimated: >60 mL/min (ref >60)
Glucose, Bld: 98 mg/dL (ref 70–99)
Potassium: 4 mmol/L (ref 3.5–5.1)
Sodium: 145 mmol/L (ref 135–145)
Total Bilirubin: 0.6 mg/dL (ref 0.3–1.2)
Total Protein: 7.1 g/dL (ref 6.5–8.1)

## 2019-06-23 NOTE — Telephone Encounter (Signed)
-----   Message from Gardenia Phlegm, NP sent at 06/23/2019 11:58 AM EDT ----- Please call patient.  Platelets are stable, and likely a variation.  They have been in the 40s in the past.  We will see what they are next time.  If she would feel better rechecking a cbc in the month, we can do that also. ----- Message ----- From: Interface, Lab In [place] Sent: 06/23/2019  10:34 AM EDT To: Gardenia Phlegm, NP

## 2019-06-23 NOTE — Telephone Encounter (Signed)
Patient notified of lab results, verbalized understanding.   Ok to continue with lab recheck in 3 months.

## 2019-06-26 ENCOUNTER — Other Ambulatory Visit: Payer: Medicare Other

## 2019-07-04 DIAGNOSIS — Z124 Encounter for screening for malignant neoplasm of cervix: Secondary | ICD-10-CM | POA: Diagnosis not present

## 2019-07-04 DIAGNOSIS — Z6835 Body mass index (BMI) 35.0-35.9, adult: Secondary | ICD-10-CM | POA: Diagnosis not present

## 2019-07-04 DIAGNOSIS — Z1231 Encounter for screening mammogram for malignant neoplasm of breast: Secondary | ICD-10-CM | POA: Diagnosis not present

## 2019-07-04 DIAGNOSIS — Z01818 Encounter for other preprocedural examination: Secondary | ICD-10-CM | POA: Diagnosis not present

## 2019-07-04 DIAGNOSIS — Z779 Other contact with and (suspected) exposures hazardous to health: Secondary | ICD-10-CM | POA: Diagnosis not present

## 2019-07-05 DIAGNOSIS — S30861A Insect bite (nonvenomous) of abdominal wall, initial encounter: Secondary | ICD-10-CM | POA: Diagnosis not present

## 2019-07-05 DIAGNOSIS — W57XXXA Bitten or stung by nonvenomous insect and other nonvenomous arthropods, initial encounter: Secondary | ICD-10-CM | POA: Diagnosis not present

## 2019-07-12 DIAGNOSIS — N950 Postmenopausal bleeding: Secondary | ICD-10-CM | POA: Diagnosis not present

## 2019-07-13 DIAGNOSIS — N950 Postmenopausal bleeding: Secondary | ICD-10-CM | POA: Diagnosis not present

## 2019-08-03 DIAGNOSIS — D2230 Melanocytic nevi of unspecified part of face: Secondary | ICD-10-CM | POA: Diagnosis not present

## 2019-08-03 DIAGNOSIS — D225 Melanocytic nevi of trunk: Secondary | ICD-10-CM | POA: Diagnosis not present

## 2019-08-03 DIAGNOSIS — Z85828 Personal history of other malignant neoplasm of skin: Secondary | ICD-10-CM | POA: Diagnosis not present

## 2019-08-03 DIAGNOSIS — D2272 Melanocytic nevi of left lower limb, including hip: Secondary | ICD-10-CM | POA: Diagnosis not present

## 2019-08-03 DIAGNOSIS — L821 Other seborrheic keratosis: Secondary | ICD-10-CM | POA: Diagnosis not present

## 2019-08-03 DIAGNOSIS — D2261 Melanocytic nevi of right upper limb, including shoulder: Secondary | ICD-10-CM | POA: Diagnosis not present

## 2019-08-03 DIAGNOSIS — S40861A Insect bite (nonvenomous) of right upper arm, initial encounter: Secondary | ICD-10-CM | POA: Diagnosis not present

## 2019-08-03 DIAGNOSIS — D2271 Melanocytic nevi of right lower limb, including hip: Secondary | ICD-10-CM | POA: Diagnosis not present

## 2019-08-03 DIAGNOSIS — Z86018 Personal history of other benign neoplasm: Secondary | ICD-10-CM | POA: Diagnosis not present

## 2019-08-03 DIAGNOSIS — W57XXXA Bitten or stung by nonvenomous insect and other nonvenomous arthropods, initial encounter: Secondary | ICD-10-CM | POA: Diagnosis not present

## 2019-08-03 DIAGNOSIS — L578 Other skin changes due to chronic exposure to nonionizing radiation: Secondary | ICD-10-CM | POA: Diagnosis not present

## 2019-09-24 NOTE — Progress Notes (Signed)
Patient Care Team: Lawerance Cruel, MD as PCP - General (Family Medicine) Bo Merino, MD as Consulting Physician (Rheumatology)  DIAGNOSIS:    ICD-10-CM   1. Malignant neoplasm of upper-inner quadrant of left breast in female, estrogen receptor positive ([place])  C50.212    Z17.0     SUMMARY OF ONCOLOGIC HISTORY: Oncology History  Breast cancer of upper-inner quadrant of left female breast ([place])  12/31/2011 - 03/11/2012 Neo-Adjuvant Chemotherapy   Taxotere Herceptin Perjeta 4 followed by Herceptin maintenance completed 01/29/2013   04/06/2012 Surgery   Left breast mastectomy: IDC grade 1; 0.8 cm, low-grade DCIS, LV I identified, 1/2 lymph node Micro met, ER/PR positive HER-2 positive Ki-67 94%   05/04/2012 - 06/20/2012 Radiation Therapy   Adjuvant radiation therapy   07/29/2012 -  Anti-estrogen oral therapy   Arimidex 1 mg daily   10/17/2014 Procedure   Bone marrow biopsy for thrombocytopenia and leukopenia: Hypercellular marrow with trilineage dysplasia most apparent in megakaryocytes with associated myelofibrosis: low-grade MDS with refractory cytopenias and multilineage dysplasia (20q- partial del)   MDS (myelodysplastic syndrome), low grade ([place])  10/17/2014 Initial Diagnosis   MDS (myelodysplastic syndrome), low grade , refractory cytopenia with multi nature dysplasia cytogenetics -20q (associated with good prognosis MDS)     CHIEF COMPLIANT: Follow-up of left breast cancer on anastrozole and MDS  INTERVAL HISTORY: Marie Anderson is a 74 y.o. with above-mentioned history of left breast cancer who underwent neoadjuvant chemotherapy, left mastectomy, radiation, and is currently on anastrozole. She also has a history of MDS. She presents to the clinic today for follow-up.   Recently she had multiple tick bites and she has been fatigued ever since and wonders if she has Lyme disease.  She had a hip MRI for pain and it showed no major abnormalities.    ALLERGIES:  is  allergic to cephalexin, cephalosporins, codeine, and tape.  MEDICATIONS:  Current Outpatient Medications  Medication Sig Dispense Refill  . anastrozole (ARIMIDEX) 1 MG tablet Take 1 tablet (1 mg total) by mouth daily. 90 tablet 3  . Calcium Carbonate-Vit D-Min (CALCIUM 1200 PO) Take 1 tablet by mouth every morning.    . cholecalciferol (VITAMIN D) 1000 UNITS tablet Take by mouth daily.     . cyanocobalamin 100 MCG tablet Take 100 mcg by mouth daily.    . fluocinonide-emollient (LIDEX-E) 0.05 % cream Apply 1 application topically daily as needed (welps and rash.).     Marland Kitchen gabapentin (NEURONTIN) 600 MG tablet Take 600 mg by mouth 2 (two) times daily.    . metoprolol succinate (TOPROL-XL) 100 MG 24 hr tablet Take 1 tablet (100 mg total) by mouth daily. 90 tablet 3  . vitamin C (ASCORBIC ACID) 500 MG tablet Take 500 mg by mouth every morning.     . vitamin E 200 UNIT capsule Take 200 Units by mouth every morning.     . zolpidem (AMBIEN CR) 12.5 MG CR tablet Take 1 tablet (12.5 mg total) by mouth at bedtime as needed. for sleep 30 tablet 3   No current facility-administered medications for this visit.    PHYSICAL EXAMINATION: ECOG PERFORMANCE STATUS: 1 - Symptomatic but completely ambulatory  Vitals:   09/25/19 0916  BP: (!) 148/86  Pulse: 79  Resp: 17  Temp: 98.7 F (37.1 C)  SpO2: 98%   Filed Weights   09/25/19 0916  Weight: 200 lb 12.8 oz (91.1 kg)    LABORATORY DATA:  I have reviewed the data as listed CMP Latest  Ref Rng & Units 06/23/2019 03/27/2019 12/23/2018  Glucose 70 - 99 mg/dL 98 101(H) 95  BUN 8 - 23 mg/dL 15 13 13   Creatinine 0.44 - 1.00 mg/dL 0.81 0.78 0.80  Sodium 135 - 145 mmol/L 145 143 142  Potassium 3.5 - 5.1 mmol/L 4.0 3.8 4.2  Chloride 98 - 111 mmol/L 107 107 108  CO2 22 - 32 mmol/L 28 27 29   Calcium 8.9 - 10.3 mg/dL 8.7(L) 8.7(L) 8.5(L)  Total Protein 6.5 - 8.1 g/dL 7.1 6.6 7.1  Total Bilirubin 0.3 - 1.2 mg/dL 0.6 0.6 0.6  Alkaline Phos 38 - 126 U/L 74 71  69  AST 15 - 41 U/L 20 18 19   ALT 0 - 44 U/L 17 15 16     Lab Results  Component Value Date   WBC 3.2 (L) 09/25/2019   HGB 14.0 09/25/2019   HCT 43.3 09/25/2019   MCV 97.5 09/25/2019   PLT 45 (L) 09/25/2019   NEUTROABS 1.7 09/25/2019    ASSESSMENT & PLAN:  Breast cancer of upper-inner quadrant of left female breast Left breast invasive ductal carcinoma ER/PR positive HER-2 negative Ki-67 94% diagnosed October 2013 status post 4 cycles of [place] followed by left mastectomy which revealed a 0.8 cm grade 1 IDC, 1 sentinel node micrometastatic disease ER/PR 100% positive status post radiation therapy and completed adjuvant trastuzumab 01/12/2013 currently on Arimidex 1 mg daily from 07/29/2012 completed May 2021  Patient completed 7 years of antiestrogen therapy and she will discontinue after the current supply is over.  Breast cancer surveillance:  Right breast mammograms: [facility].  We will obtain a copy of her mammogram. 09/25/2019: Breast exam: Benign  MDS: Labs reviewed Platelet count 45 and white count 3.2.  Severe fatigue after recent tick bites: We will obtain Lyme titers today. Return to clinic every 3 months with labs and once a year for follow-up with me.  No orders of the defined types were placed in this encounter.  The patient has a good understanding of the overall plan. she agrees with it. she will call with any problems that may develop before the next visit here.  Total time spent: 30 mins including face to face time and time spent for planning, charting and coordination of care  Nicholas Lose, MD 09/25/2019  I, Cloyde Reams Dorshimer, am acting as scribe for Dr. Nicholas Lose.  I have reviewed the above documentation for accuracy and completeness, and I agree with the above.

## 2019-09-25 ENCOUNTER — Inpatient Hospital Stay (HOSPITAL_BASED_OUTPATIENT_CLINIC_OR_DEPARTMENT_OTHER): Payer: Medicare Other | Admitting: Hematology and Oncology

## 2019-09-25 ENCOUNTER — Telehealth: Payer: Self-pay | Admitting: Hematology and Oncology

## 2019-09-25 ENCOUNTER — Other Ambulatory Visit: Payer: Self-pay | Admitting: *Deleted

## 2019-09-25 ENCOUNTER — Other Ambulatory Visit: Payer: Self-pay

## 2019-09-25 ENCOUNTER — Inpatient Hospital Stay: Payer: Medicare Other | Attending: Hematology and Oncology

## 2019-09-25 ENCOUNTER — Inpatient Hospital Stay: Payer: Medicare Other

## 2019-09-25 VITALS — BP 148/86 | HR 79 | Temp 98.7°F | Resp 17 | Ht 65.0 in | Wt 200.8 lb

## 2019-09-25 DIAGNOSIS — Z9012 Acquired absence of left breast and nipple: Secondary | ICD-10-CM | POA: Diagnosis not present

## 2019-09-25 DIAGNOSIS — R5383 Other fatigue: Secondary | ICD-10-CM | POA: Insufficient documentation

## 2019-09-25 DIAGNOSIS — C50212 Malignant neoplasm of upper-inner quadrant of left female breast: Secondary | ICD-10-CM

## 2019-09-25 DIAGNOSIS — Z79811 Long term (current) use of aromatase inhibitors: Secondary | ICD-10-CM | POA: Diagnosis not present

## 2019-09-25 DIAGNOSIS — Z170 Estrogen receptor positive status [ER+]: Secondary | ICD-10-CM

## 2019-09-25 DIAGNOSIS — W57XXXA Bitten or stung by nonvenomous insect and other nonvenomous arthropods, initial encounter: Secondary | ICD-10-CM

## 2019-09-25 DIAGNOSIS — Z9221 Personal history of antineoplastic chemotherapy: Secondary | ICD-10-CM | POA: Insufficient documentation

## 2019-09-25 DIAGNOSIS — Z923 Personal history of irradiation: Secondary | ICD-10-CM | POA: Diagnosis not present

## 2019-09-25 LAB — CBC WITH DIFFERENTIAL (CANCER CENTER ONLY)
Abs Immature Granulocytes: 0.01 10*3/uL (ref 0.00–0.07)
Basophils Absolute: 0 10*3/uL (ref 0.0–0.1)
Basophils Relative: 1 %
Eosinophils Absolute: 0 10*3/uL (ref 0.0–0.5)
Eosinophils Relative: 1 %
HCT: 43.3 % (ref 36.0–46.0)
Hemoglobin: 14 g/dL (ref 12.0–15.0)
Immature Granulocytes: 0 %
Lymphocytes Relative: 34 %
Lymphs Abs: 1.1 10*3/uL (ref 0.7–4.0)
MCH: 31.5 pg (ref 26.0–34.0)
MCHC: 32.3 g/dL (ref 30.0–36.0)
MCV: 97.5 fL (ref 80.0–100.0)
Monocytes Absolute: 0.3 10*3/uL (ref 0.1–1.0)
Monocytes Relative: 11 %
Neutro Abs: 1.7 10*3/uL (ref 1.7–7.7)
Neutrophils Relative %: 53 %
Platelet Count: 45 10*3/uL — ABNORMAL LOW (ref 150–400)
RBC: 4.44 MIL/uL (ref 3.87–5.11)
RDW: 14.1 % (ref 11.5–15.5)
WBC Count: 3.2 10*3/uL — ABNORMAL LOW (ref 4.0–10.5)
nRBC: 0 % (ref 0.0–0.2)

## 2019-09-25 LAB — CMP (CANCER CENTER ONLY)
ALT: 16 U/L (ref 0–44)
AST: 23 U/L (ref 15–41)
Albumin: 4 g/dL (ref 3.5–5.0)
Alkaline Phosphatase: 70 U/L (ref 38–126)
Anion gap: 8 (ref 5–15)
BUN: 13 mg/dL (ref 8–23)
CO2: 27 mmol/L (ref 22–32)
Calcium: 9.4 mg/dL (ref 8.9–10.3)
Chloride: 107 mmol/L (ref 98–111)
Creatinine: 0.85 mg/dL (ref 0.44–1.00)
GFR, Est AFR Am: >60 mL/min (ref >60)
GFR, Estimated: >60 mL/min (ref >60)
Glucose, Bld: 95 mg/dL (ref 70–99)
Potassium: 4.4 mmol/L (ref 3.5–5.1)
Sodium: 142 mmol/L (ref 135–145)
Total Bilirubin: 0.7 mg/dL (ref 0.3–1.2)
Total Protein: 7.3 g/dL (ref 6.5–8.1)

## 2019-09-25 LAB — IRON AND TIBC
Iron: 86 ug/dL (ref 41–142)
Saturation Ratios: 29 % (ref 21–57)
TIBC: 293 ug/dL (ref 236–444)
UIBC: 207 ug/dL (ref 120–384)

## 2019-09-25 LAB — FERRITIN: Ferritin: 738 ng/mL — ABNORMAL HIGH (ref 11–307)

## 2019-09-25 NOTE — Assessment & Plan Note (Signed)
Left breast invasive ductal carcinoma ER/PR positive HER-2 negative Ki-67 94% diagnosed October 2013 status post 4 cycles of [place] followed by left mastectomy which revealed a 0.8 cm grade 1 IDC, 1 sentinel node micrometastatic disease ER/PR 100% positive status post radiation therapy and completed adjuvant trastuzumab 01/12/2013 currently on Arimidex 1 mg daily from 07/29/2012 completed May 2021  Patient completed 7 years of antiestrogen therapy and she has discontinued it in May.  Breast cancer surveillance:  Right breast mammograms 09/25/2019: Breast exam: Benign

## 2019-09-25 NOTE — Telephone Encounter (Signed)
Scheduled appts per 7/26 los. Gave pt a print out of AVS.

## 2019-09-26 LAB — LYME AB/WESTERN BLOT REFLEX
B burgdorferi Ab IgG+IgM: <0.91 {ISR} (ref 0.00–0.90)
LYME DISEASE AB, QUANT, IGM: <0.8 index (ref 0.00–0.79)

## 2019-09-27 ENCOUNTER — Other Ambulatory Visit: Payer: Self-pay | Admitting: Hematology and Oncology

## 2019-09-27 DIAGNOSIS — F5101 Primary insomnia: Secondary | ICD-10-CM

## 2019-09-27 DIAGNOSIS — D4620 Refractory anemia with excess of blasts, unspecified: Secondary | ICD-10-CM

## 2019-09-28 ENCOUNTER — Telehealth: Payer: Self-pay

## 2019-09-28 NOTE — Telephone Encounter (Signed)
Pt called stating she is concerned about Ferritin results and is requesting call/advice from Dr Lindi Adie. Please advise

## 2019-09-29 ENCOUNTER — Telehealth: Payer: Self-pay | Admitting: *Deleted

## 2019-09-29 NOTE — Telephone Encounter (Signed)
Pt called again today ( see call note from yesterday ) concerned due to " ferritin level being off the chart "  Shanvi states since seeing elevated iron she " has been looking on the internet - and probably shouldn't because I see all kinds of diagnosis about high ferritin levels"  This RN discussed noted ferritin of 763 as well as reviewed other labs which are in the normal range.  This RN informed the pt the MD is out of the office but RN could discuss results to help answer concerns.  Sharnay stated she has been taking a multiple vitamin with iron and vitamin C to help absorption.  Due to level of ferritin she viewed on My Chart she stopped the supplements on 09/27/2019.  Rashawna states she was taking the iron due to " I was anemic as a child so I thought I needed it "  This RN discussed how iron is stored in the body and she presently does not need supplemental iron.  Per discussion she does not cook with cast iron and does not have well water.  She denies any family members with high iron issues.  Breona states she would like to have her iron level recheck " in a month and make sure my level is dropping "  This RN informed her she is presently scheduled for lab in 3 months- her inquiry will be given to MD for further review and recommendations.

## 2019-10-02 ENCOUNTER — Telehealth: Payer: Self-pay | Admitting: Hematology and Oncology

## 2019-10-02 NOTE — Telephone Encounter (Signed)
I left a voicemail for the patient that the elevated ferritin is likely to be an acute phase reactant secondary to underlying inflammation.  Her iron saturation is normal suggesting that there is no evidence of iron overload. She also does not need to take any iron supplementation. We will recheck her labs in January when she comes back for follow-up.

## 2019-12-25 DIAGNOSIS — Z23 Encounter for immunization: Secondary | ICD-10-CM | POA: Diagnosis not present

## 2019-12-27 ENCOUNTER — Other Ambulatory Visit: Payer: Self-pay

## 2019-12-27 ENCOUNTER — Inpatient Hospital Stay: Payer: Medicare Other | Attending: Hematology and Oncology

## 2019-12-27 DIAGNOSIS — R5383 Other fatigue: Secondary | ICD-10-CM | POA: Insufficient documentation

## 2019-12-27 DIAGNOSIS — C50212 Malignant neoplasm of upper-inner quadrant of left female breast: Secondary | ICD-10-CM | POA: Insufficient documentation

## 2019-12-27 DIAGNOSIS — Z9221 Personal history of antineoplastic chemotherapy: Secondary | ICD-10-CM | POA: Diagnosis not present

## 2019-12-27 DIAGNOSIS — D4620 Refractory anemia with excess of blasts, unspecified: Secondary | ICD-10-CM | POA: Insufficient documentation

## 2019-12-27 DIAGNOSIS — Z923 Personal history of irradiation: Secondary | ICD-10-CM | POA: Insufficient documentation

## 2019-12-27 DIAGNOSIS — Z170 Estrogen receptor positive status [ER+]: Secondary | ICD-10-CM | POA: Insufficient documentation

## 2019-12-27 LAB — CMP (CANCER CENTER ONLY)
ALT: 14 U/L (ref 0–44)
AST: 19 U/L (ref 15–41)
Albumin: 3.7 g/dL (ref 3.5–5.0)
Alkaline Phosphatase: 75 U/L (ref 38–126)
Anion gap: 6 (ref 5–15)
BUN: 9 mg/dL (ref 8–23)
CO2: 29 mmol/L (ref 22–32)
Calcium: 8.7 mg/dL — ABNORMAL LOW (ref 8.9–10.3)
Chloride: 106 mmol/L (ref 98–111)
Creatinine: 0.81 mg/dL (ref 0.44–1.00)
GFR, Estimated: >60 mL/min (ref >60)
Glucose, Bld: 96 mg/dL (ref 70–99)
Potassium: 3.7 mmol/L (ref 3.5–5.1)
Sodium: 141 mmol/L (ref 135–145)
Total Bilirubin: 0.7 mg/dL (ref 0.3–1.2)
Total Protein: 7 g/dL (ref 6.5–8.1)

## 2019-12-27 LAB — CBC WITH DIFFERENTIAL (CANCER CENTER ONLY)
Abs Immature Granulocytes: 0.01 10*3/uL (ref 0.00–0.07)
Basophils Absolute: 0 10*3/uL (ref 0.0–0.1)
Basophils Relative: 0 %
Eosinophils Absolute: 0 10*3/uL (ref 0.0–0.5)
Eosinophils Relative: 1 %
HCT: 41.9 % (ref 36.0–46.0)
Hemoglobin: 13.7 g/dL (ref 12.0–15.0)
Immature Granulocytes: 0 %
Lymphocytes Relative: 25 %
Lymphs Abs: 0.8 10*3/uL (ref 0.7–4.0)
MCH: 31.9 pg (ref 26.0–34.0)
MCHC: 32.7 g/dL (ref 30.0–36.0)
MCV: 97.4 fL (ref 80.0–100.0)
Monocytes Absolute: 0.2 10*3/uL (ref 0.1–1.0)
Monocytes Relative: 7 %
Neutro Abs: 2.1 10*3/uL (ref 1.7–7.7)
Neutrophils Relative %: 67 %
Platelet Count: 47 10*3/uL — ABNORMAL LOW (ref 150–400)
RBC: 4.3 MIL/uL (ref 3.87–5.11)
RDW: 13.8 % (ref 11.5–15.5)
WBC Count: 3.2 10*3/uL — ABNORMAL LOW (ref 4.0–10.5)
nRBC: 0 % (ref 0.0–0.2)

## 2020-03-11 ENCOUNTER — Other Ambulatory Visit: Payer: Self-pay | Admitting: Cardiology

## 2020-03-11 DIAGNOSIS — I471 Supraventricular tachycardia: Secondary | ICD-10-CM

## 2020-03-12 ENCOUNTER — Telehealth: Payer: Self-pay | Admitting: Cardiology

## 2020-03-12 NOTE — Telephone Encounter (Signed)
*  STAT* If patient is at the pharmacy, call can be transferred to refill team.   1. Which medications need to be refilled? (please list name of each medication and dose if known) metoprolol succinate (TOPROL-XL) 100 MG 24 hr tablet  2. Which pharmacy/location (including street and city if local pharmacy) is medication to be sent to? [id number] - [place], [place] - [address]  3. Do they need a 30 day or 90 day supply? 90 day   Patient is out of medication.

## 2020-03-26 ENCOUNTER — Other Ambulatory Visit: Payer: Self-pay

## 2020-03-26 DIAGNOSIS — C50212 Malignant neoplasm of upper-inner quadrant of left female breast: Secondary | ICD-10-CM

## 2020-03-26 DIAGNOSIS — Z170 Estrogen receptor positive status [ER+]: Secondary | ICD-10-CM

## 2020-03-26 DIAGNOSIS — D4620 Refractory anemia with excess of blasts, unspecified: Secondary | ICD-10-CM

## 2020-03-27 ENCOUNTER — Other Ambulatory Visit: Payer: Self-pay

## 2020-03-27 ENCOUNTER — Inpatient Hospital Stay: Payer: Medicare Other | Attending: Hematology and Oncology

## 2020-03-27 DIAGNOSIS — C50212 Malignant neoplasm of upper-inner quadrant of left female breast: Secondary | ICD-10-CM | POA: Diagnosis not present

## 2020-03-27 DIAGNOSIS — Z9221 Personal history of antineoplastic chemotherapy: Secondary | ICD-10-CM | POA: Diagnosis not present

## 2020-03-27 DIAGNOSIS — Z170 Estrogen receptor positive status [ER+]: Secondary | ICD-10-CM | POA: Diagnosis not present

## 2020-03-27 DIAGNOSIS — D4620 Refractory anemia with excess of blasts, unspecified: Secondary | ICD-10-CM | POA: Diagnosis not present

## 2020-03-27 DIAGNOSIS — R5383 Other fatigue: Secondary | ICD-10-CM | POA: Insufficient documentation

## 2020-03-27 DIAGNOSIS — Z923 Personal history of irradiation: Secondary | ICD-10-CM | POA: Insufficient documentation

## 2020-03-27 LAB — CBC WITH DIFFERENTIAL (CANCER CENTER ONLY)
Abs Immature Granulocytes: 0.01 10*3/uL (ref 0.00–0.07)
Basophils Absolute: 0 10*3/uL (ref 0.0–0.1)
Basophils Relative: 0 %
Eosinophils Absolute: 0.1 10*3/uL (ref 0.0–0.5)
Eosinophils Relative: 2 %
HCT: 42.8 % (ref 36.0–46.0)
Hemoglobin: 14 g/dL (ref 12.0–15.0)
Immature Granulocytes: 0 %
Lymphocytes Relative: 29 %
Lymphs Abs: 0.9 10*3/uL (ref 0.7–4.0)
MCH: 31.6 pg (ref 26.0–34.0)
MCHC: 32.7 g/dL (ref 30.0–36.0)
MCV: 96.6 fL (ref 80.0–100.0)
Monocytes Absolute: 0.2 10*3/uL (ref 0.1–1.0)
Monocytes Relative: 7 %
Neutro Abs: 1.9 10*3/uL (ref 1.7–7.7)
Neutrophils Relative %: 62 %
Platelet Count: 44 10*3/uL — ABNORMAL LOW (ref 150–400)
RBC: 4.43 MIL/uL (ref 3.87–5.11)
RDW: 14.3 % (ref 11.5–15.5)
WBC Count: 3.1 10*3/uL — ABNORMAL LOW (ref 4.0–10.5)
nRBC: 0 % (ref 0.0–0.2)

## 2020-03-27 LAB — CMP (CANCER CENTER ONLY)
ALT: 19 U/L (ref 0–44)
AST: 23 U/L (ref 15–41)
Albumin: 4 g/dL (ref 3.5–5.0)
Alkaline Phosphatase: 72 U/L (ref 38–126)
Anion gap: 9 (ref 5–15)
BUN: 13 mg/dL (ref 8–23)
CO2: 27 mmol/L (ref 22–32)
Calcium: 8.7 mg/dL — ABNORMAL LOW (ref 8.9–10.3)
Chloride: 107 mmol/L (ref 98–111)
Creatinine: 0.8 mg/dL (ref 0.44–1.00)
GFR, Estimated: >60 mL/min (ref >60)
Glucose, Bld: 94 mg/dL (ref 70–99)
Potassium: 3.8 mmol/L (ref 3.5–5.1)
Sodium: 143 mmol/L (ref 135–145)
Total Bilirubin: 0.7 mg/dL (ref 0.3–1.2)
Total Protein: 7.4 g/dL (ref 6.5–8.1)

## 2020-03-27 LAB — FERRITIN: Ferritin: 758 ng/mL — ABNORMAL HIGH (ref 11–307)

## 2020-03-27 LAB — IRON AND TIBC
Iron: 106 ug/dL (ref 41–142)
Saturation Ratios: 37 % (ref 21–57)
TIBC: 284 ug/dL (ref 236–444)
UIBC: 178 ug/dL (ref 120–384)

## 2020-04-06 NOTE — Progress Notes (Unsigned)
Marie Anderson Date of Birth: 1945-03-04 Medical Record #[id number]  History of Present Illness: Marie Anderson is seen for  followup SVT. She has a history of supraventricular tachycardia that has been well controlled with metoprolol. She also has a history of breast cancer. She had a left mastectomy with reconstruction. She was treated with Herceptin. Serial Echos have been normal. Previously she had an episode of SVT requiring Adenosine in November 2013. In July 2015 she had an episode that responded to Valsalva maneuver.   She was seen in the ED in [place] on July 31, 2016 with SVT rate 185. Converted with IV adenosine. Labs revealed plts 77K. Otherwise ok. Troponin negative.   Since does have a history of myelodysplasia syndrome.  Thrombocytopenia and leukopenia.  She also has been diagnosed with Sjogren's syndrome and antiphospholipid syndrome.  d  On follow up today she is feeling very well. She does note some pain/arthritis in her legs. She has rare brief flutters that responds to a quick cough. No sustained episodes.     Current Outpatient Medications on File Prior to Visit  Medication Sig Dispense Refill  . aspirin EC 81 MG tablet Take 81 mg by mouth daily. Swallow whole.    . Calcium Carbonate-Vit D-Min (CALCIUM 1200 PO) Take 1 tablet by mouth every morning.    . cholecalciferol (VITAMIN D) 1000 UNITS tablet Take by mouth daily.     . cyanocobalamin 100 MCG tablet Take 100 mcg by mouth daily.    . fluocinonide-emollient (LIDEX-E) 0.05 % cream Apply 1 application topically daily as needed (welps and rash.).     Marland Kitchen gabapentin (NEURONTIN) 600 MG tablet Take 600 mg by mouth 2 (two) times daily.    . metoprolol succinate (TOPROL-XL) 100 MG 24 hr tablet TAKE 1 TABLET BY MOUTH EVERY DAY 90 tablet 3  . vitamin E 200 UNIT capsule Take 200 Units by mouth every morning.     . zolpidem (AMBIEN CR) 12.5 MG CR tablet TAKE 1 TABLET (12.5 MG TOTAL) BY MOUTH AT BEDTIME AS NEEDED. FOR SLEEP 30  tablet 3   No current facility-administered medications on file prior to visit.    Allergies  Allergen Reactions  . Cephalexin   . Cephalosporins     headache  . Codeine Nausea Only  . Tape Other (See Comments)    Sensitivity to bandaids and tape that cause blisters and redness    Past Medical History:  Diagnosis Date  . Anemia   . Antiphospholipid antibody syndrome ([place])   . Breast cancer ([place]) 12/04/11   left- inv ductal ca, DCIS, ER/PR +, HER 2 +  . Family history of breast cancer   . Family history of ovarian cancer   . Family history of pancreatic cancer   . History of chemotherapy 12/31/11 -03/11/12   s/p 4 cycles  . History of radiation therapy 05/04/12-06/20/12   left breast/  . Myelodysplasia   . Neuropathy   . PONV (postoperative nausea and vomiting)   . Skin cancer    Melanoma at 45  . SVT (supraventricular tachycardia) ([facility])     Past Surgical History:  Procedure Laterality Date  . AUGMENTATION MAMMAPLASTY Bilateral    2014 with reduction on right  . BREAST RECONSTRUCTION WITH PLACEMENT OF TISSUE EXPANDER AND FLEX HD (ACELLULAR HYDRATED DERMIS)  04/06/2012   Procedure: BREAST RECONSTRUCTION WITH PLACEMENT OF TISSUE EXPANDER AND FLEX HD (ACELLULAR HYDRATED DERMIS);  Surgeon: Theodoro Kos, DO;  Location: [facility];  Service: Plastics;  Laterality: Left;  IMMEDIATE LEFT BREAST RECONSTRUCTION WITH PLACEMENT OF TISSUE EXPANDER AND ALLODERM  . COLONOSCOPY    . DILATION AND CURETTAGE OF UTERUS    . EYE SURGERY     age 23-lt eye growth  . LIPOSUCTION WITH LIPOFILLING N/A 01/19/2013   Procedure: LIPOSUCTION WITH LIPOFILLING;  Surgeon: Theodoro Kos, DO;  Location: [facility];  Service: Plastics;  Laterality: N/A;  . MASTECTOMY Left    2014  . MASTECTOMY W/ SENTINEL NODE BIOPSY  04/06/2012   Procedure: MASTECTOMY WITH SENTINEL LYMPH NODE BIOPSY;  Surgeon: Rolm Bookbinder, MD;  Location: [facility];  Service: General;   Laterality: Left;  left mastectomy, left axillary sentinel node biopsy  . MASTOPEXY Right 01/19/2013   Procedure: RIGHT BREAST PLACEMENT OF IMPLANT WITH MASTOPEXY;  Surgeon: Theodoro Kos, DO;  Location: [facility];  Service: Plastics;  Laterality: Right;  . PORT-A-CATH REMOVAL Right 01/19/2013   Procedure: REMOVAL PORT-A-CATH;  Surgeon: Rolm Bookbinder, MD;  Location: [facility];  Service: General;  Laterality: Right;  . PORTACATH PLACEMENT  12/23/2011   Procedure: INSERTION PORT-A-CATH;  Surgeon: Rolm Bookbinder, MD;  Location: [facility];  Service: General;  Laterality: Right;  . REMOVAL OF TISSUE EXPANDER AND PLACEMENT OF IMPLANT Left 01/19/2013   Procedure: REMOVAL OF LEFT TISSUE EXPANDERS WITH PLACEMENT OF LEFT BREAST IMPLANT;  Surgeon: Theodoro Kos, DO;  Location: [facility];  Service: Plastics;  Laterality: Left;    Social History   Tobacco Use  Smoking Status Never Smoker  Smokeless Tobacco Never Used    Social History   Substance and Sexual Activity  Alcohol Use Yes    Family History  Problem Relation Age of Onset  . Stroke Mother   . Cancer Father        prostate  . Kidney cancer Maternal Uncle   . Cancer Paternal Uncle        ? bone cancer  . Stroke Maternal Grandmother   . Heart attack Maternal Grandfather   . Pancreatic cancer Cousin 90       maternal first cousin - mother is identical twin to pateint's mother  . Cancer Cousin        unknown cancer in maternal first cousin  . Ovarian cancer Cousin   . Pancreatic cancer Cousin 53    Review of Systems: As noted in history of present illness.  All other systems were reviewed and are negative.  Physical Exam: BP (!) 136/100   Pulse 78   Ht 5\' 5"  (1.651 m)   Wt 198 lb 3.2 oz (89.9 kg)   BMI 32.98 kg/m  GENERAL:  Well appearing, obese WF in NAD HEENT:  PERRL, EOMI, sclera are clear. Oropharynx is clear. NECK:  No jugular venous distention,  carotid upstroke brisk and symmetric, no bruits, no thyromegaly or adenopathy LUNGS:  Clear to auscultation bilaterally CHEST:  Unremarkable HEART:  RRR,  PMI not displaced or sustained,S1 and S2 within normal limits, no S3, no S4: no clicks, no rubs, no murmurs ABD:  Soft, nontender. BS +, no masses or bruits. No hepatomegaly, no splenomegaly EXT:  2 + pulses throughout, no edema, no cyanosis no clubbing SKIN:  Warm and dry.  No rashes NEURO:  Alert and oriented x 3. Cranial nerves II through XII intact. PSYCH:  Cognitively intact    LABORATORY DATA:  Lab Results  Component Value Date   WBC 3.1 (L) 03/27/2020   HGB 14.0 03/27/2020  HCT 42.8 03/27/2020   PLT 44 (L) 03/27/2020   GLUCOSE 94 03/27/2020   ALT 19 03/27/2020   AST 23 03/27/2020   NA 143 03/27/2020   K 3.8 03/27/2020   CL 107 03/27/2020   CREATININE 0.80 03/27/2020   BUN 13 03/27/2020   CO2 27 03/27/2020   INR 1.00 10/17/2014    Ecg today: NSR, rate 78. No acute change.  I have personally reviewed and interpreted this study.   Assessment / Plan: 1. Supraventricular tachycardia. Well controlled on metoprolol. Toprol refilled.  avoid stimulants use antihistamine only if needed. Follow up in one year.  2. Breast cancer status post mastectomy. s/p chemotherapy with Herceptin remotely. Echocardiogram showed normal LV function.   3. Myelodysplasia syndrome- with thrombocytopenia and neutropenia. Followed by oncology.  4. Sjogren's syndrome  5. Antiphospholipid syndrome. Follow by Rheumatology.  Follow up in one year

## 2020-04-10 ENCOUNTER — Other Ambulatory Visit: Payer: Self-pay

## 2020-04-10 ENCOUNTER — Ambulatory Visit (INDEPENDENT_AMBULATORY_CARE_PROVIDER_SITE_OTHER): Payer: Medicare Other | Admitting: Cardiology

## 2020-04-10 ENCOUNTER — Encounter: Payer: Self-pay | Admitting: Cardiology

## 2020-04-10 VITALS — BP 136/100 | HR 78 | Ht 65.0 in | Wt 198.2 lb

## 2020-04-10 DIAGNOSIS — I471 Supraventricular tachycardia: Secondary | ICD-10-CM

## 2020-04-28 DIAGNOSIS — R300 Dysuria: Secondary | ICD-10-CM | POA: Diagnosis not present

## 2020-04-28 DIAGNOSIS — N390 Urinary tract infection, site not specified: Secondary | ICD-10-CM | POA: Diagnosis not present

## 2020-05-02 ENCOUNTER — Other Ambulatory Visit: Payer: Self-pay | Admitting: Hematology and Oncology

## 2020-06-03 ENCOUNTER — Other Ambulatory Visit: Payer: Self-pay | Admitting: Hematology and Oncology

## 2020-06-03 DIAGNOSIS — F5101 Primary insomnia: Secondary | ICD-10-CM

## 2020-06-03 DIAGNOSIS — D4620 Refractory anemia with excess of blasts, unspecified: Secondary | ICD-10-CM

## 2020-06-25 ENCOUNTER — Other Ambulatory Visit: Payer: Self-pay | Admitting: *Deleted

## 2020-06-25 DIAGNOSIS — C50212 Malignant neoplasm of upper-inner quadrant of left female breast: Secondary | ICD-10-CM

## 2020-06-26 ENCOUNTER — Other Ambulatory Visit: Payer: Self-pay

## 2020-06-26 ENCOUNTER — Inpatient Hospital Stay: Payer: Medicare Other | Attending: Hematology and Oncology

## 2020-06-26 DIAGNOSIS — Z9221 Personal history of antineoplastic chemotherapy: Secondary | ICD-10-CM | POA: Diagnosis not present

## 2020-06-26 DIAGNOSIS — C50212 Malignant neoplasm of upper-inner quadrant of left female breast: Secondary | ICD-10-CM | POA: Insufficient documentation

## 2020-06-26 DIAGNOSIS — D4620 Refractory anemia with excess of blasts, unspecified: Secondary | ICD-10-CM | POA: Diagnosis not present

## 2020-06-26 DIAGNOSIS — R5383 Other fatigue: Secondary | ICD-10-CM | POA: Diagnosis not present

## 2020-06-26 DIAGNOSIS — Z923 Personal history of irradiation: Secondary | ICD-10-CM | POA: Insufficient documentation

## 2020-06-26 DIAGNOSIS — Z170 Estrogen receptor positive status [ER+]: Secondary | ICD-10-CM | POA: Insufficient documentation

## 2020-06-26 LAB — CBC WITH DIFFERENTIAL (CANCER CENTER ONLY)
Abs Immature Granulocytes: 0.01 10*3/uL (ref 0.00–0.07)
Basophils Absolute: 0 10*3/uL (ref 0.0–0.1)
Basophils Relative: 1 %
Eosinophils Absolute: 0.1 10*3/uL (ref 0.0–0.5)
Eosinophils Relative: 2 %
HCT: 41.8 % (ref 36.0–46.0)
Hemoglobin: 13.7 g/dL (ref 12.0–15.0)
Immature Granulocytes: 0 %
Lymphocytes Relative: 31 %
Lymphs Abs: 0.9 10*3/uL (ref 0.7–4.0)
MCH: 31.6 pg (ref 26.0–34.0)
MCHC: 32.8 g/dL (ref 30.0–36.0)
MCV: 96.3 fL (ref 80.0–100.0)
Monocytes Absolute: 0.3 10*3/uL (ref 0.1–1.0)
Monocytes Relative: 9 %
Neutro Abs: 1.8 10*3/uL (ref 1.7–7.7)
Neutrophils Relative %: 57 %
Platelet Count: 44 10*3/uL — ABNORMAL LOW (ref 150–400)
RBC: 4.34 MIL/uL (ref 3.87–5.11)
RDW: 14.2 % (ref 11.5–15.5)
WBC Count: 3 10*3/uL — ABNORMAL LOW (ref 4.0–10.5)
nRBC: 0 % (ref 0.0–0.2)

## 2020-06-26 LAB — CMP (CANCER CENTER ONLY)
ALT: 13 U/L (ref 0–44)
AST: 20 U/L (ref 15–41)
Albumin: 3.7 g/dL (ref 3.5–5.0)
Alkaline Phosphatase: 65 U/L (ref 38–126)
Anion gap: 8 (ref 5–15)
BUN: 11 mg/dL (ref 8–23)
CO2: 27 mmol/L (ref 22–32)
Calcium: 8.6 mg/dL — ABNORMAL LOW (ref 8.9–10.3)
Chloride: 108 mmol/L (ref 98–111)
Creatinine: 0.77 mg/dL (ref 0.44–1.00)
GFR, Estimated: >60 mL/min (ref >60)
Glucose, Bld: 97 mg/dL (ref 70–99)
Potassium: 3.9 mmol/L (ref 3.5–5.1)
Sodium: 143 mmol/L (ref 135–145)
Total Bilirubin: 0.6 mg/dL (ref 0.3–1.2)
Total Protein: 6.9 g/dL (ref 6.5–8.1)

## 2020-06-26 LAB — IRON AND TIBC
Iron: 78 ug/dL (ref 41–142)
Saturation Ratios: 30 % (ref 21–57)
TIBC: 263 ug/dL (ref 236–444)
UIBC: 185 ug/dL (ref 120–384)

## 2020-06-26 LAB — FERRITIN: Ferritin: 597 ng/mL — ABNORMAL HIGH (ref 11–307)

## 2020-07-30 DIAGNOSIS — Z779 Other contact with and (suspected) exposures hazardous to health: Secondary | ICD-10-CM | POA: Diagnosis not present

## 2020-07-30 DIAGNOSIS — Z6834 Body mass index (BMI) 34.0-34.9, adult: Secondary | ICD-10-CM | POA: Diagnosis not present

## 2020-07-30 DIAGNOSIS — B373 Candidiasis of vulva and vagina: Secondary | ICD-10-CM | POA: Diagnosis not present

## 2020-07-30 DIAGNOSIS — Z01419 Encounter for gynecological examination (general) (routine) without abnormal findings: Secondary | ICD-10-CM | POA: Diagnosis not present

## 2020-07-30 DIAGNOSIS — Z1231 Encounter for screening mammogram for malignant neoplasm of breast: Secondary | ICD-10-CM | POA: Diagnosis not present

## 2020-08-05 ENCOUNTER — Other Ambulatory Visit: Payer: Self-pay | Admitting: Hematology and Oncology

## 2020-08-05 DIAGNOSIS — F5101 Primary insomnia: Secondary | ICD-10-CM

## 2020-08-05 DIAGNOSIS — D4620 Refractory anemia with excess of blasts, unspecified: Secondary | ICD-10-CM

## 2020-08-07 DIAGNOSIS — L821 Other seborrheic keratosis: Secondary | ICD-10-CM | POA: Diagnosis not present

## 2020-08-07 DIAGNOSIS — D2272 Melanocytic nevi of left lower limb, including hip: Secondary | ICD-10-CM | POA: Diagnosis not present

## 2020-08-07 DIAGNOSIS — L820 Inflamed seborrheic keratosis: Secondary | ICD-10-CM | POA: Diagnosis not present

## 2020-08-07 DIAGNOSIS — Z85828 Personal history of other malignant neoplasm of skin: Secondary | ICD-10-CM | POA: Diagnosis not present

## 2020-08-07 DIAGNOSIS — L578 Other skin changes due to chronic exposure to nonionizing radiation: Secondary | ICD-10-CM | POA: Diagnosis not present

## 2020-08-07 DIAGNOSIS — S80862A Insect bite (nonvenomous), left lower leg, initial encounter: Secondary | ICD-10-CM | POA: Diagnosis not present

## 2020-08-07 DIAGNOSIS — C44319 Basal cell carcinoma of skin of other parts of face: Secondary | ICD-10-CM | POA: Diagnosis not present

## 2020-08-07 DIAGNOSIS — D225 Melanocytic nevi of trunk: Secondary | ICD-10-CM | POA: Diagnosis not present

## 2020-08-07 DIAGNOSIS — Z86018 Personal history of other benign neoplasm: Secondary | ICD-10-CM | POA: Diagnosis not present

## 2020-08-07 DIAGNOSIS — D2271 Melanocytic nevi of right lower limb, including hip: Secondary | ICD-10-CM | POA: Diagnosis not present

## 2020-08-07 DIAGNOSIS — D485 Neoplasm of uncertain behavior of skin: Secondary | ICD-10-CM | POA: Diagnosis not present

## 2020-08-14 DIAGNOSIS — N762 Acute vulvitis: Secondary | ICD-10-CM | POA: Diagnosis not present

## 2020-09-17 ENCOUNTER — Other Ambulatory Visit: Payer: Self-pay

## 2020-09-17 DIAGNOSIS — D4620 Refractory anemia with excess of blasts, unspecified: Secondary | ICD-10-CM

## 2020-09-17 DIAGNOSIS — C50212 Malignant neoplasm of upper-inner quadrant of left female breast: Secondary | ICD-10-CM

## 2020-09-17 NOTE — Progress Notes (Signed)
Patient Care Team: Lawerance Cruel, MD as PCP - General (Family Medicine) Bo Merino, MD as Consulting Physician (Rheumatology)  DIAGNOSIS:    ICD-10-CM   1. Malignant neoplasm of upper-inner quadrant of left breast in female, estrogen receptor positive ([place])  C50.212    Z17.0       SUMMARY OF ONCOLOGIC HISTORY: Oncology History  Breast cancer of upper-inner quadrant of left female breast ([place])  12/31/2011 - 03/11/2012 Neo-Adjuvant Chemotherapy   Taxotere Herceptin Perjeta 4 followed by Herceptin maintenance completed 01/29/2013    04/06/2012 Surgery   Left breast mastectomy: IDC grade 1; 0.8 cm, low-grade DCIS, LV I identified, 1/2 lymph node Micro met, ER/PR positive HER-2 positive Ki-67 94%    05/04/2012 - 06/20/2012 Radiation Therapy   Adjuvant radiation therapy    07/29/2012 -  Anti-estrogen oral therapy   Arimidex 1 mg daily    10/17/2014 Procedure   Bone marrow biopsy for thrombocytopenia and leukopenia: Hypercellular marrow with trilineage dysplasia most apparent in megakaryocytes with associated myelofibrosis: low-grade MDS with refractory cytopenias and multilineage dysplasia (20q- partial del)    MDS (myelodysplastic syndrome), low grade ([place])  10/17/2014 Initial Diagnosis   MDS (myelodysplastic syndrome), low grade , refractory cytopenia with multi nature dysplasia cytogenetics -20q (associated with good prognosis MDS)      CHIEF COMPLIANT: Follow-up of left breast cancer on anastrozole and MDS  INTERVAL HISTORY: Marie Anderson is a 75 y.o. with above-mentioned history of left breast cancer who underwent neoadjuvant chemotherapy, left mastectomy, radiation, and is currently on anastrozole. She also has a history of MDS. She presents to the clinic today for follow-up.  She has multiple tick bites which do not appear to be infected.  She has basal cell carcinoma diagnosis on the medial aspect of the left eye area and nose.  This needs to be removed with  surgery today.  ALLERGIES:  is allergic to cephalexin, cephalosporins, codeine, and tape.  MEDICATIONS:  Current Outpatient Medications  Medication Sig Dispense Refill   aspirin EC 81 MG tablet Take 81 mg by mouth daily. Swallow whole.     Calcium Carbonate-Vit D-Min (CALCIUM 1200 PO) Take 1 tablet by mouth every morning.     cholecalciferol (VITAMIN D) 1000 UNITS tablet Take by mouth daily.      cyanocobalamin 100 MCG tablet Take 100 mcg by mouth daily.     fluocinonide-emollient (LIDEX-E) 0.05 % cream Apply 1 application topically daily as needed (welps and rash.).      gabapentin (NEURONTIN) 600 MG tablet Take 600 mg by mouth 2 (two) times daily.     metoprolol succinate (TOPROL-XL) 100 MG 24 hr tablet TAKE 1 TABLET BY MOUTH EVERY DAY 90 tablet 3   vitamin E 200 UNIT capsule Take 200 Units by mouth every morning.      zolpidem (AMBIEN CR) 12.5 MG CR tablet TAKE 1 TABLET (12.5 MG TOTAL) BY MOUTH AT BEDTIME AS NEEDED. FOR SLEEP 15 tablet 1   No current facility-administered medications for this visit.    PHYSICAL EXAMINATION: ECOG PERFORMANCE STATUS: 1 - Symptomatic but completely ambulatory  Vitals:   09/18/20 0839  BP: (!) 165/83  Pulse: 84  Resp: 18  Temp: (!) 97.3 F (36.3 C)  SpO2: 97%   Filed Weights   09/18/20 0839  Weight: 201 lb (91.2 kg)      LABORATORY DATA:  I have reviewed the data as listed CMP Latest Ref Rng & Units 06/26/2020 03/27/2020 12/27/2019  Glucose 70 - 99  mg/dL 97 94 96  BUN 8 - 23 mg/dL 11 13 9   Creatinine 0.44 - 1.00 mg/dL 0.77 0.80 0.81  Sodium 135 - 145 mmol/L 143 143 141  Potassium 3.5 - 5.1 mmol/L 3.9 3.8 3.7  Chloride 98 - 111 mmol/L 108 107 106  CO2 22 - 32 mmol/L 27 27 29   Calcium 8.9 - 10.3 mg/dL 8.6(L) 8.7(L) 8.7(L)  Total Protein 6.5 - 8.1 g/dL 6.9 7.4 7.0  Total Bilirubin 0.3 - 1.2 mg/dL 0.6 0.7 0.7  Alkaline Phos 38 - 126 U/L 65 72 75  AST 15 - 41 U/L 20 23 19   ALT 0 - 44 U/L 13 19 14     Lab Results  Component Value Date    WBC 3.0 (L) 09/18/2020   HGB 14.2 09/18/2020   HCT 43.1 09/18/2020   MCV 98.0 09/18/2020   PLT 44 (L) 09/18/2020   NEUTROABS 1.7 09/18/2020    ASSESSMENT & PLAN:  Breast cancer of upper-inner quadrant of left female breast ([place]) Left breast invasive ductal carcinoma ER/PR positive HER-2 negative Ki-67 94% diagnosed October 2013 status post 4 cycles of [place] followed by left mastectomy which revealed a 0.8 cm grade 1 IDC, 1 sentinel node micrometastatic disease ER/PR 100% positive status post radiation therapy and completed adjuvant trastuzumab 01/12/2013 currently on Arimidex 1 mg daily from 07/29/2012 completed May 2021   Breast cancer surveillance:  Right breast mammograms: [facility].  She had a recent mammogram we will try to get the results.   Basal cell carcinoma on the face: To be removed today 09/18/2020 MDS: Labs reviewed 06/26/2020: Platelet count 44 and white count 3, ANC 1.8 . 09/18/2020: Platelets 44 WBC 3 Her labs remain unchanged.   Return to clinic every 6 months with labs and once a year for follow-up with me.    No orders of the defined types were placed in this encounter.  The patient has a good understanding of the overall plan. she agrees with it. she will call with any problems that may develop before the next visit here.  Total time spent: 20 mins including face to face time and time spent for planning, charting and coordination of care  Rulon Eisenmenger, MD, MPH 09/18/2020  I, Thana Ates, am acting as scribe for Dr. Nicholas Lose.  I have reviewed the above documentation for accuracy and completeness, and I agree with the above.

## 2020-09-18 ENCOUNTER — Other Ambulatory Visit: Payer: Self-pay

## 2020-09-18 ENCOUNTER — Inpatient Hospital Stay (HOSPITAL_BASED_OUTPATIENT_CLINIC_OR_DEPARTMENT_OTHER): Payer: Medicare Other | Admitting: Hematology and Oncology

## 2020-09-18 ENCOUNTER — Inpatient Hospital Stay: Payer: Medicare Other | Attending: Hematology and Oncology

## 2020-09-18 DIAGNOSIS — C50212 Malignant neoplasm of upper-inner quadrant of left female breast: Secondary | ICD-10-CM | POA: Insufficient documentation

## 2020-09-18 DIAGNOSIS — Z7982 Long term (current) use of aspirin: Secondary | ICD-10-CM | POA: Insufficient documentation

## 2020-09-18 DIAGNOSIS — D469 Myelodysplastic syndrome, unspecified: Secondary | ICD-10-CM | POA: Diagnosis not present

## 2020-09-18 DIAGNOSIS — F5101 Primary insomnia: Secondary | ICD-10-CM

## 2020-09-18 DIAGNOSIS — Z79899 Other long term (current) drug therapy: Secondary | ICD-10-CM | POA: Insufficient documentation

## 2020-09-18 DIAGNOSIS — Z79811 Long term (current) use of aromatase inhibitors: Secondary | ICD-10-CM | POA: Diagnosis not present

## 2020-09-18 DIAGNOSIS — Z170 Estrogen receptor positive status [ER+]: Secondary | ICD-10-CM | POA: Diagnosis not present

## 2020-09-18 DIAGNOSIS — D485 Neoplasm of uncertain behavior of skin: Secondary | ICD-10-CM | POA: Diagnosis not present

## 2020-09-18 DIAGNOSIS — D4620 Refractory anemia with excess of blasts, unspecified: Secondary | ICD-10-CM | POA: Diagnosis not present

## 2020-09-18 DIAGNOSIS — Z923 Personal history of irradiation: Secondary | ICD-10-CM | POA: Insufficient documentation

## 2020-09-18 DIAGNOSIS — Z9012 Acquired absence of left breast and nipple: Secondary | ICD-10-CM | POA: Diagnosis not present

## 2020-09-18 DIAGNOSIS — L821 Other seborrheic keratosis: Secondary | ICD-10-CM | POA: Diagnosis not present

## 2020-09-18 DIAGNOSIS — C44319 Basal cell carcinoma of skin of other parts of face: Secondary | ICD-10-CM | POA: Diagnosis not present

## 2020-09-18 LAB — FERRITIN: Ferritin: 663 ng/mL — ABNORMAL HIGH (ref 11–307)

## 2020-09-18 LAB — CBC WITH DIFFERENTIAL (CANCER CENTER ONLY)
Abs Immature Granulocytes: 0.01 10*3/uL (ref 0.00–0.07)
Basophils Absolute: 0 10*3/uL (ref 0.0–0.1)
Basophils Relative: 1 %
Eosinophils Absolute: 0 10*3/uL (ref 0.0–0.5)
Eosinophils Relative: 1 %
HCT: 43.1 % (ref 36.0–46.0)
Hemoglobin: 14.2 g/dL (ref 12.0–15.0)
Immature Granulocytes: 0 %
Lymphocytes Relative: 33 %
Lymphs Abs: 1 10*3/uL (ref 0.7–4.0)
MCH: 32.3 pg (ref 26.0–34.0)
MCHC: 32.9 g/dL (ref 30.0–36.0)
MCV: 98 fL (ref 80.0–100.0)
Monocytes Absolute: 0.2 10*3/uL (ref 0.1–1.0)
Monocytes Relative: 7 %
Neutro Abs: 1.7 10*3/uL (ref 1.7–7.7)
Neutrophils Relative %: 58 %
Platelet Count: 44 10*3/uL — ABNORMAL LOW (ref 150–400)
RBC: 4.4 MIL/uL (ref 3.87–5.11)
RDW: 14.4 % (ref 11.5–15.5)
WBC Count: 3 10*3/uL — ABNORMAL LOW (ref 4.0–10.5)
nRBC: 0 % (ref 0.0–0.2)

## 2020-09-18 LAB — CMP (CANCER CENTER ONLY)
ALT: 16 U/L (ref 0–44)
AST: 23 U/L (ref 15–41)
Albumin: 3.7 g/dL (ref 3.5–5.0)
Alkaline Phosphatase: 70 U/L (ref 38–126)
Anion gap: 6 (ref 5–15)
BUN: 11 mg/dL (ref 8–23)
CO2: 30 mmol/L (ref 22–32)
Calcium: 8.8 mg/dL — ABNORMAL LOW (ref 8.9–10.3)
Chloride: 106 mmol/L (ref 98–111)
Creatinine: 0.77 mg/dL (ref 0.44–1.00)
GFR, Estimated: >60 mL/min (ref >60)
Glucose, Bld: 110 mg/dL — ABNORMAL HIGH (ref 70–99)
Potassium: 4.3 mmol/L (ref 3.5–5.1)
Sodium: 142 mmol/L (ref 135–145)
Total Bilirubin: 0.8 mg/dL (ref 0.3–1.2)
Total Protein: 7.2 g/dL (ref 6.5–8.1)

## 2020-09-18 LAB — IRON AND TIBC
Iron: 84 ug/dL (ref 41–142)
Saturation Ratios: 31 % (ref 21–57)
TIBC: 270 ug/dL (ref 236–444)
UIBC: 186 ug/dL (ref 120–384)

## 2020-09-18 MED ORDER — ZOLPIDEM TARTRATE ER 12.5 MG PO TBCR: 12.5000 mg | EXTENDED_RELEASE_TABLET | Freq: Every evening | ORAL | 3 refills | Status: DC | PRN

## 2020-09-18 NOTE — Assessment & Plan Note (Signed)
Left breast invasive ductal carcinoma ER/PR positive HER-2 negative Ki-67 94% diagnosed October 2013 status post 4 cycles of [place] followed by left mastectomy which revealed a 0.8 cm grade 1 IDC, 1 sentinel node micrometastatic disease ER/PR 100% positive status post radiation therapy and completed adjuvant trastuzumab 01/12/2013 currently on Arimidex 1 mg daily from 07/29/2012 completed May 2021  Breast cancer surveillance: Right breast mammograms: [facility].  We will obtain a copy of her mammogram. 09/18/2020: Breast exam: Benign  MDS: Labs reviewed 06/26/2020: Platelet count 44 and white count 3, ANC 1.8 .  Return to clinic every 6 months with labs and once a year for follow-up with me.

## 2020-09-25 ENCOUNTER — Ambulatory Visit: Payer: Medicare Other | Admitting: Hematology and Oncology

## 2020-09-25 ENCOUNTER — Other Ambulatory Visit: Payer: Medicare Other

## 2020-10-21 DIAGNOSIS — Z20822 Contact with and (suspected) exposure to covid-19: Secondary | ICD-10-CM | POA: Diagnosis not present

## 2020-11-03 DIAGNOSIS — Z20822 Contact with and (suspected) exposure to covid-19: Secondary | ICD-10-CM | POA: Diagnosis not present

## 2020-11-25 DIAGNOSIS — J069 Acute upper respiratory infection, unspecified: Secondary | ICD-10-CM | POA: Diagnosis not present

## 2020-11-25 DIAGNOSIS — J0100 Acute maxillary sinusitis, unspecified: Secondary | ICD-10-CM | POA: Diagnosis not present

## 2020-11-25 DIAGNOSIS — R0981 Nasal congestion: Secondary | ICD-10-CM | POA: Diagnosis not present

## 2021-01-15 ENCOUNTER — Other Ambulatory Visit: Payer: Self-pay | Admitting: *Deleted

## 2021-01-15 DIAGNOSIS — F5101 Primary insomnia: Secondary | ICD-10-CM

## 2021-01-15 DIAGNOSIS — D4620 Refractory anemia with excess of blasts, unspecified: Secondary | ICD-10-CM

## 2021-01-15 MED ORDER — ZOLPIDEM TARTRATE ER 12.5 MG PO TBCR: 12.5000 mg | EXTENDED_RELEASE_TABLET | Freq: Every evening | ORAL | 3 refills | Status: DC | PRN

## 2021-03-07 ENCOUNTER — Other Ambulatory Visit: Payer: Self-pay | Admitting: Cardiology

## 2021-03-07 DIAGNOSIS — I471 Supraventricular tachycardia: Secondary | ICD-10-CM

## 2021-03-13 ENCOUNTER — Other Ambulatory Visit: Payer: Self-pay | Admitting: Hematology and Oncology

## 2021-03-13 DIAGNOSIS — F5101 Primary insomnia: Secondary | ICD-10-CM

## 2021-03-13 DIAGNOSIS — D4620 Refractory anemia with excess of blasts, unspecified: Secondary | ICD-10-CM

## 2021-03-19 ENCOUNTER — Other Ambulatory Visit: Payer: Self-pay

## 2021-03-19 ENCOUNTER — Inpatient Hospital Stay: Payer: Medicare Other | Attending: Hematology and Oncology

## 2021-03-19 DIAGNOSIS — Z7982 Long term (current) use of aspirin: Secondary | ICD-10-CM | POA: Insufficient documentation

## 2021-03-19 DIAGNOSIS — Z9012 Acquired absence of left breast and nipple: Secondary | ICD-10-CM | POA: Diagnosis not present

## 2021-03-19 DIAGNOSIS — C50212 Malignant neoplasm of upper-inner quadrant of left female breast: Secondary | ICD-10-CM | POA: Diagnosis not present

## 2021-03-19 DIAGNOSIS — Z170 Estrogen receptor positive status [ER+]: Secondary | ICD-10-CM | POA: Diagnosis not present

## 2021-03-19 DIAGNOSIS — Z79899 Other long term (current) drug therapy: Secondary | ICD-10-CM | POA: Diagnosis not present

## 2021-03-19 DIAGNOSIS — Z923 Personal history of irradiation: Secondary | ICD-10-CM | POA: Insufficient documentation

## 2021-03-19 DIAGNOSIS — D469 Myelodysplastic syndrome, unspecified: Secondary | ICD-10-CM | POA: Insufficient documentation

## 2021-03-19 DIAGNOSIS — Z79811 Long term (current) use of aromatase inhibitors: Secondary | ICD-10-CM | POA: Insufficient documentation

## 2021-03-19 DIAGNOSIS — D4620 Refractory anemia with excess of blasts, unspecified: Secondary | ICD-10-CM

## 2021-03-19 LAB — CBC WITH DIFFERENTIAL (CANCER CENTER ONLY)
Abs Immature Granulocytes: 0.01 10*3/uL (ref 0.00–0.07)
Basophils Absolute: 0 10*3/uL (ref 0.0–0.1)
Basophils Relative: 1 %
Eosinophils Absolute: 0 10*3/uL (ref 0.0–0.5)
Eosinophils Relative: 1 %
HCT: 40.8 % (ref 36.0–46.0)
Hemoglobin: 13.5 g/dL (ref 12.0–15.0)
Immature Granulocytes: 0 %
Lymphocytes Relative: 35 %
Lymphs Abs: 1.1 10*3/uL (ref 0.7–4.0)
MCH: 32.5 pg (ref 26.0–34.0)
MCHC: 33.1 g/dL (ref 30.0–36.0)
MCV: 98.1 fL (ref 80.0–100.0)
Monocytes Absolute: 0.2 10*3/uL (ref 0.1–1.0)
Monocytes Relative: 7 %
Neutro Abs: 1.7 10*3/uL (ref 1.7–7.7)
Neutrophils Relative %: 56 %
Platelet Count: 42 10*3/uL — ABNORMAL LOW (ref 150–400)
RBC: 4.16 MIL/uL (ref 3.87–5.11)
RDW: 14.3 % (ref 11.5–15.5)
WBC Count: 3.1 10*3/uL — ABNORMAL LOW (ref 4.0–10.5)
nRBC: 0 % (ref 0.0–0.2)

## 2021-04-10 ENCOUNTER — Other Ambulatory Visit: Payer: Self-pay | Admitting: Hematology and Oncology

## 2021-04-10 DIAGNOSIS — F5101 Primary insomnia: Secondary | ICD-10-CM

## 2021-04-10 DIAGNOSIS — D4620 Refractory anemia with excess of blasts, unspecified: Secondary | ICD-10-CM

## 2021-04-10 MED ORDER — ZOLPIDEM TARTRATE ER 12.5 MG PO TBCR: 12.5000 mg | EXTENDED_RELEASE_TABLET | Freq: Every evening | ORAL | 3 refills | Status: DC | PRN

## 2021-05-27 NOTE — Progress Notes (Signed)
? ?Marie Anderson ?Date of Birth: 12/18/1945 ?Medical Record #[id number] ? ?History of Present Illness: ?Marie Anderson is seen for  followup SVT. She has a history of supraventricular tachycardia that has been well controlled with metoprolol. She also has a history of breast cancer. She had a left mastectomy with reconstruction. She was treated with Herceptin. Serial Echos have been normal. Previously she had an episode of SVT requiring Adenosine in November 2013. In July 2015 she had an episode that responded to Valsalva maneuver.  ? ?She was seen in the ED in [place] on July 31, 2016 with SVT rate 185. Converted with IV adenosine. Labs revealed plts 77K. Otherwise ok. Troponin negative.  ? ?Since does have a history of myelodysplasia syndrome.  Thrombocytopenia and leukopenia.  She also has been diagnosed with Sjogren's syndrome and antiphospholipid syndrome.   ? ?On follow up today she is feeling very well. She describes a few episodes of SVT limited by coughing. She had one more prolonged episode in Jan at night. She took an extra metoprolol and by the am it had resolved.  ? ? ?Current Outpatient Medications on File Prior to Visit  ?Medication Sig Dispense Refill  ? Calcium Carbonate-Vit D-Min (CALCIUM 1200 PO) Take 1 tablet by mouth every morning.    ? cholecalciferol (VITAMIN D) 1000 UNITS tablet Take by mouth daily.     ? cyanocobalamin 100 MCG tablet Take 100 mcg by mouth daily.    ? gabapentin (NEURONTIN) 300 MG capsule Take 300 mg by mouth at bedtime.    ? metoprolol succinate (TOPROL-XL) 100 MG 24 hr tablet TAKE 1 TABLET BY MOUTH EVERY DAY 90 tablet 3  ? vitamin E 200 UNIT capsule Take 200 Units by mouth every morning.     ? zolpidem (AMBIEN CR) 12.5 MG CR tablet Take 1 tablet (12.5 mg total) by mouth at bedtime as needed. for sleep 15 tablet 3  ? fluocinonide-emollient (LIDEX-E) 0.05 % cream Apply 1 application topically daily as needed (welps and rash.).  (Patient not taking: Reported on 06/02/2021)     ? ?No current facility-administered medications on file prior to visit.  ? ? ?Allergies  ?Allergen Reactions  ? Cephalexin   ? Cephalosporins   ?  headache  ? Codeine Nausea Only  ? Tape Other (See Comments)  ?  Sensitivity to bandaids and tape that cause blisters and redness  ? ? ?Past Medical History:  ?Diagnosis Date  ? Anemia   ? Antiphospholipid antibody syndrome (HCC)   ? Breast cancer ([place]) 12/04/11  ? left- inv ductal ca, DCIS, ER/PR +, HER 2 +  ? Family history of breast cancer   ? Family history of ovarian cancer   ? Family history of pancreatic cancer   ? History of chemotherapy 12/31/11 -03/11/12  ? s/p 4 cycles  ? History of radiation therapy 05/04/12-06/20/12  ? left breast/  ? Myelodysplasia   ? Neuropathy   ? PONV (postoperative nausea and vomiting)   ? Skin cancer   ? Melanoma at 6  ? SVT (supraventricular tachycardia) ([place])   ? ? ?Past Surgical History:  ?Procedure Laterality Date  ? AUGMENTATION MAMMAPLASTY Bilateral   ? 2014 with reduction on right  ? BREAST RECONSTRUCTION WITH PLACEMENT OF TISSUE EXPANDER AND FLEX HD (ACELLULAR HYDRATED DERMIS)  04/06/2012  ? Procedure: BREAST RECONSTRUCTION WITH PLACEMENT OF TISSUE EXPANDER AND FLEX HD (ACELLULAR HYDRATED DERMIS);  Surgeon: Theodoro Kos, DO;  Location: [facility];  Service: Plastics;  Laterality: Left;  IMMEDIATE LEFT BREAST RECONSTRUCTION WITH PLACEMENT OF TISSUE EXPANDER AND ALLODERM  ? COLONOSCOPY    ? DILATION AND CURETTAGE OF UTERUS    ? EYE SURGERY    ? age 24-lt eye growth  ? LIPOSUCTION WITH LIPOFILLING N/A 01/19/2013  ? Procedure: LIPOSUCTION WITH LIPOFILLING;  Surgeon: Theodoro Kos, DO;  Location: [facility];  Service: Plastics;  Laterality: N/A;  ? MASTECTOMY Left   ? 2014  ? MASTECTOMY W/ SENTINEL NODE BIOPSY  04/06/2012  ? Procedure: MASTECTOMY WITH SENTINEL LYMPH NODE BIOPSY;  Surgeon: Rolm Bookbinder, MD;  Location: [facility];  Service: General;  Laterality: Left;  left mastectomy, left  axillary sentinel node biopsy  ? MASTOPEXY Right 01/19/2013  ? Procedure: RIGHT BREAST PLACEMENT OF IMPLANT WITH MASTOPEXY;  Surgeon: Theodoro Kos, DO;  Location: [facility];  Service: Plastics;  Laterality: Right;  ? PORT-A-CATH REMOVAL Right 01/19/2013  ? Procedure: REMOVAL PORT-A-CATH;  Surgeon: Rolm Bookbinder, MD;  Location: [facility];  Service: General;  Laterality: Right;  ? PORTACATH PLACEMENT  12/23/2011  ? Procedure: INSERTION PORT-A-CATH;  Surgeon: Rolm Bookbinder, MD;  Location: [facility];  Service: General;  Laterality: Right;  ? REMOVAL OF TISSUE EXPANDER AND PLACEMENT OF IMPLANT Left 01/19/2013  ? Procedure: REMOVAL OF LEFT TISSUE EXPANDERS WITH PLACEMENT OF LEFT BREAST IMPLANT;  Surgeon: Theodoro Kos, DO;  Location: [facility];  Service: Plastics;  Laterality: Left;  ? ? ?Social History  ? ?Tobacco Use  ?Smoking Status Never  ?Smokeless Tobacco Never  ? ? ?Social History  ? ?Substance and Sexual Activity  ?Alcohol Use Yes  ? ? ?Family History  ?Problem Relation Age of Onset  ? Stroke Mother   ? Cancer Father   ?     prostate  ? Kidney cancer Maternal Uncle   ? Cancer Paternal Uncle   ?     ? bone cancer  ? Stroke Maternal Grandmother   ? Heart attack Maternal Grandfather   ? Pancreatic cancer Cousin 41  ?     maternal first cousin - mother is identical twin to pateint's mother  ? Cancer Cousin   ?     unknown cancer in maternal first cousin  ? Ovarian cancer Cousin   ? Pancreatic cancer Cousin 20  ? ? ?Review of Systems: ?As noted in history of present illness.  All other systems were reviewed and are negative. ? ?Physical Exam: ?BP (!) 180/96   Pulse 79   Ht '5\' 5"'$  (1.651 m)   Wt 198 lb 6.4 oz (90 kg)   SpO2 97%   BMI 33.02 kg/m?  ?GENERAL:  Well appearing, obese WF in NAD ?HEENT:  PERRL, EOMI, sclera are clear. Oropharynx is clear. ?NECK:  No jugular venous distention, carotid upstroke brisk and symmetric, no bruits, no  thyromegaly or adenopathy ?LUNGS:  Clear to auscultation bilaterally ?CHEST:  Unremarkable ?HEART:  RRR,  PMI not displaced or sustained,S1 and S2 within normal limits, no S3, no S4: no clicks, no rubs, no murmurs ?ABD:  Soft, nontender. BS +, no masses or bruits. No hepatomegaly, no splenomegaly ?EXT:  2 + pulses throughout, no edema, no cyanosis no clubbing ?SKIN:  Warm and dry.  No rashes ?NEURO:  Alert and oriented x 3. Cranial nerves II through XII intact. ?PSYCH:  Cognitively intact ? ? ? ?LABORATORY DATA: ? ?Lab Results  ?Component Value Date  ? WBC 3.1 (L) 03/19/2021  ? HGB 13.5 03/19/2021  ?  HCT 40.8 03/19/2021  ? PLT 42 (L) 03/19/2021  ? GLUCOSE 110 (H) 09/18/2020  ? ALT 16 09/18/2020  ? AST 23 09/18/2020  ? NA 142 09/18/2020  ? K 4.3 09/18/2020  ? CL 106 09/18/2020  ? CREATININE 0.77 09/18/2020  ? BUN 11 09/18/2020  ? CO2 30 09/18/2020  ? INR 1.00 10/17/2014  ? ? ?Ecg today: NSR, rate 79. LVH, nonspecific TWA. No acute change.  I have personally reviewed and interpreted this study. ? ? ?Assessment / Plan: ?1. Supraventricular tachycardia. Well controlled on metoprolol. Toprol refilled.  avoid stimulants use antihistamine only if needed. Follow up in one year. ? ?2. Breast cancer status post mastectomy. s/p chemotherapy with Herceptin remotely. Echocardiogram showed normal LV function.  ? ?3. Myelodysplasia syndrome- with thrombocytopenia and neutropenia. Followed by oncology. Given low platelet count I have recommended she stop taking ASA  ? ?4. Sjogren's syndrome ? ?5. Antiphospholipid syndrome. Follow by Rheumatology. ? ?Follow up in one year ? ?

## 2021-06-02 ENCOUNTER — Ambulatory Visit (INDEPENDENT_AMBULATORY_CARE_PROVIDER_SITE_OTHER): Payer: Medicare Other | Admitting: Cardiology

## 2021-06-02 ENCOUNTER — Encounter: Payer: Self-pay | Admitting: Cardiology

## 2021-06-02 DIAGNOSIS — I471 Supraventricular tachycardia: Secondary | ICD-10-CM

## 2021-06-02 MED ORDER — METOPROLOL SUCCINATE ER 100 MG PO TB24: 100.0000 mg | ORAL_TABLET | Freq: Every day | ORAL | 3 refills | Status: DC

## 2021-06-16 DIAGNOSIS — Z20822 Contact with and (suspected) exposure to covid-19: Secondary | ICD-10-CM | POA: Diagnosis not present

## 2021-06-21 DIAGNOSIS — J189 Pneumonia, unspecified organism: Secondary | ICD-10-CM | POA: Diagnosis not present

## 2021-06-21 DIAGNOSIS — H6691 Otitis media, unspecified, right ear: Secondary | ICD-10-CM | POA: Diagnosis not present

## 2021-08-18 ENCOUNTER — Other Ambulatory Visit: Payer: Self-pay | Admitting: Hematology and Oncology

## 2021-08-18 DIAGNOSIS — D46Z Other myelodysplastic syndromes: Secondary | ICD-10-CM

## 2021-08-18 DIAGNOSIS — F5101 Primary insomnia: Secondary | ICD-10-CM

## 2021-08-18 MED ORDER — ZOLPIDEM TARTRATE ER 12.5 MG PO TBCR: 12.5000 mg | EXTENDED_RELEASE_TABLET | Freq: Every evening | ORAL | 3 refills | Status: DC | PRN

## 2021-09-10 ENCOUNTER — Telehealth: Payer: Self-pay | Admitting: Hematology and Oncology

## 2021-09-10 NOTE — Telephone Encounter (Signed)
Rescheduled appointment per provider covering [facility]. Left voicemail.

## 2021-09-17 ENCOUNTER — Ambulatory Visit: Payer: Medicare Other | Admitting: Hematology and Oncology

## 2021-09-17 ENCOUNTER — Other Ambulatory Visit: Payer: Medicare Other

## 2021-09-25 ENCOUNTER — Inpatient Hospital Stay: Payer: Medicare Other

## 2021-09-25 ENCOUNTER — Inpatient Hospital Stay: Payer: Medicare Other | Admitting: Hematology and Oncology

## 2021-10-08 DIAGNOSIS — Z85828 Personal history of other malignant neoplasm of skin: Secondary | ICD-10-CM | POA: Diagnosis not present

## 2021-10-08 DIAGNOSIS — Z86018 Personal history of other benign neoplasm: Secondary | ICD-10-CM | POA: Diagnosis not present

## 2021-10-08 DIAGNOSIS — L578 Other skin changes due to chronic exposure to nonionizing radiation: Secondary | ICD-10-CM | POA: Diagnosis not present

## 2021-10-08 DIAGNOSIS — D225 Melanocytic nevi of trunk: Secondary | ICD-10-CM | POA: Diagnosis not present

## 2021-10-08 DIAGNOSIS — D2230 Melanocytic nevi of unspecified part of face: Secondary | ICD-10-CM | POA: Diagnosis not present

## 2021-10-08 DIAGNOSIS — D2271 Melanocytic nevi of right lower limb, including hip: Secondary | ICD-10-CM | POA: Diagnosis not present

## 2021-10-08 DIAGNOSIS — L820 Inflamed seborrheic keratosis: Secondary | ICD-10-CM | POA: Diagnosis not present

## 2021-10-08 DIAGNOSIS — L570 Actinic keratosis: Secondary | ICD-10-CM | POA: Diagnosis not present

## 2021-10-08 DIAGNOSIS — D2272 Melanocytic nevi of left lower limb, including hip: Secondary | ICD-10-CM | POA: Diagnosis not present

## 2021-10-15 DIAGNOSIS — H35371 Puckering of macula, right eye: Secondary | ICD-10-CM | POA: Diagnosis not present

## 2021-10-15 DIAGNOSIS — H3561 Retinal hemorrhage, right eye: Secondary | ICD-10-CM | POA: Diagnosis not present

## 2021-10-15 DIAGNOSIS — H2513 Age-related nuclear cataract, bilateral: Secondary | ICD-10-CM | POA: Diagnosis not present

## 2021-10-19 NOTE — Progress Notes (Signed)
Patient Care Team: Lawerance Cruel, MD as PCP - General (Family Medicine) Bo Merino, MD as Consulting Physician (Rheumatology)  DIAGNOSIS: No diagnosis found.  SUMMARY OF ONCOLOGIC HISTORY: Oncology History  Breast cancer of upper-inner quadrant of left female breast ([place])  12/31/2011 - 03/11/2012 Neo-Adjuvant Chemotherapy   Taxotere Herceptin Perjeta 4 followed by Herceptin maintenance completed 01/29/2013   04/06/2012 Surgery   Left breast mastectomy: IDC grade 1; 0.8 cm, low-grade DCIS, LV I identified, 1/2 lymph node Micro met, ER/PR positive HER-2 positive Ki-67 94%   05/04/2012 - 06/20/2012 Radiation Therapy   Adjuvant radiation therapy   07/29/2012 -  Anti-estrogen oral therapy   Arimidex 1 mg daily   10/17/2014 Procedure   Bone marrow biopsy for thrombocytopenia and leukopenia: Hypercellular marrow with trilineage dysplasia most apparent in megakaryocytes with associated myelofibrosis: low-grade MDS with refractory cytopenias and multilineage dysplasia (20q- partial del)   MDS (myelodysplastic syndrome), low grade ([place])  10/17/2014 Initial Diagnosis   MDS (myelodysplastic syndrome), low grade , refractory cytopenia with multi nature dysplasia cytogenetics -20q (associated with good prognosis MDS)     CHIEF COMPLIANT: Follow-up of left breast cancer on anastrozole and MDS    INTERVAL HISTORY: Marie Anderson is a 76 y.o. with above-mentioned history of left breast cancer who underwent neoadjuvant chemotherapy, left mastectomy, radiation, and is currently on anastrozole. She also has a history of MDS. She presents to the clinic today for follow-up.   ALLERGIES:  is allergic to cephalexin, cephalosporins, codeine, and tape.  MEDICATIONS:  Current Outpatient Medications  Medication Sig Dispense Refill   Calcium Carbonate-Vit D-Min (CALCIUM 1200 PO) Take 1 tablet by mouth every morning.     cholecalciferol (VITAMIN D) 1000 UNITS tablet Take by mouth daily.       cyanocobalamin 100 MCG tablet Take 100 mcg by mouth daily.     fluocinonide-emollient (LIDEX-E) 0.05 % cream Apply 1 application topically daily as needed (welps and rash.).  (Patient not taking: Reported on 06/02/2021)     gabapentin (NEURONTIN) 300 MG capsule Take 300 mg by mouth at bedtime.     metoprolol succinate (TOPROL-XL) 100 MG 24 hr tablet Take 1 tablet (100 mg total) by mouth daily. Take with or immediately following a meal. 90 tablet 3   vitamin E 200 UNIT capsule Take 200 Units by mouth every morning.      zolpidem (AMBIEN CR) 12.5 MG CR tablet Take 1 tablet (12.5 mg total) by mouth at bedtime as needed. for sleep 15 tablet 3   No current facility-administered medications for this visit.    PHYSICAL EXAMINATION: ECOG PERFORMANCE STATUS: {CHL ONC ECOG PS:[id number]}  There were no vitals filed for this visit. There were no vitals filed for this visit.  BREAST:*** No palpable masses or nodules in either right or left breasts. No palpable axillary supraclavicular or infraclavicular adenopathy no breast tenderness or nipple discharge. (exam performed in the presence of a chaperone)  LABORATORY DATA:  I have reviewed the data as listed    Latest Ref Rng & Units 09/18/2020    8:20 AM 06/26/2020   10:19 AM 03/27/2020   10:04 AM  CMP  Glucose 70 - 99 mg/dL 110  97  94   BUN 8 - 23 mg/dL 11  11  13    Creatinine 0.44 - 1.00 mg/dL 0.77  0.77  0.80   Sodium 135 - 145 mmol/L 142  143  143   Potassium 3.5 - 5.1 mmol/L 4.3  3.9  3.8  Chloride 98 - 111 mmol/L 106  108  107   CO2 22 - 32 mmol/L 30  27  27    Calcium 8.9 - 10.3 mg/dL 8.8  8.6  8.7   Total Protein 6.5 - 8.1 g/dL 7.2  6.9  7.4   Total Bilirubin 0.3 - 1.2 mg/dL 0.8  0.6  0.7   Alkaline Phos 38 - 126 U/L 70  65  72   AST 15 - 41 U/L 23  20  23    ALT 0 - 44 U/L 16  13  19      Lab Results  Component Value Date   WBC 3.1 (L) 03/19/2021   HGB 13.5 03/19/2021   HCT 40.8 03/19/2021   MCV 98.1 03/19/2021   PLT 42 (L)  03/19/2021   NEUTROABS 1.7 03/19/2021    ASSESSMENT & PLAN:  No problem-specific Assessment & Plan notes found for this encounter.    No orders of the defined types were placed in this encounter.  The patient has a good understanding of the overall plan. she agrees with it. she will call with any problems that may develop before the next visit here. Total time spent: 30 mins including face to face time and time spent for planning, charting and co-ordination of care   Suzzette Righter, [place] 10/19/21    I Gardiner Coins am scribing for Dr. Lindi Adie  ***

## 2021-10-21 ENCOUNTER — Other Ambulatory Visit: Payer: Self-pay

## 2021-10-21 DIAGNOSIS — D46Z Other myelodysplastic syndromes: Secondary | ICD-10-CM

## 2021-10-22 ENCOUNTER — Other Ambulatory Visit: Payer: Self-pay | Admitting: *Deleted

## 2021-10-22 ENCOUNTER — Other Ambulatory Visit: Payer: Self-pay

## 2021-10-22 ENCOUNTER — Inpatient Hospital Stay: Payer: Medicare Other | Attending: Hematology and Oncology

## 2021-10-22 ENCOUNTER — Inpatient Hospital Stay: Payer: Medicare Other

## 2021-10-22 ENCOUNTER — Ambulatory Visit: Payer: Medicare Other

## 2021-10-22 ENCOUNTER — Inpatient Hospital Stay (HOSPITAL_BASED_OUTPATIENT_CLINIC_OR_DEPARTMENT_OTHER): Payer: Medicare Other | Admitting: Hematology and Oncology

## 2021-10-22 DIAGNOSIS — Z79899 Other long term (current) drug therapy: Secondary | ICD-10-CM | POA: Insufficient documentation

## 2021-10-22 DIAGNOSIS — D46Z Other myelodysplastic syndromes: Secondary | ICD-10-CM | POA: Diagnosis not present

## 2021-10-22 DIAGNOSIS — Z170 Estrogen receptor positive status [ER+]: Secondary | ICD-10-CM

## 2021-10-22 DIAGNOSIS — C50212 Malignant neoplasm of upper-inner quadrant of left female breast: Secondary | ICD-10-CM

## 2021-10-22 DIAGNOSIS — Z7982 Long term (current) use of aspirin: Secondary | ICD-10-CM | POA: Insufficient documentation

## 2021-10-22 DIAGNOSIS — Z9012 Acquired absence of left breast and nipple: Secondary | ICD-10-CM | POA: Insufficient documentation

## 2021-10-22 DIAGNOSIS — D4620 Refractory anemia with excess of blasts, unspecified: Secondary | ICD-10-CM | POA: Diagnosis not present

## 2021-10-22 DIAGNOSIS — Z79811 Long term (current) use of aromatase inhibitors: Secondary | ICD-10-CM | POA: Insufficient documentation

## 2021-10-22 DIAGNOSIS — Z923 Personal history of irradiation: Secondary | ICD-10-CM | POA: Insufficient documentation

## 2021-10-22 LAB — CMP (CANCER CENTER ONLY)
ALT: 11 U/L (ref 0–44)
AST: 18 U/L (ref 15–41)
Albumin: 3.9 g/dL (ref 3.5–5.0)
Alkaline Phosphatase: 64 U/L (ref 38–126)
Anion gap: 3 — ABNORMAL LOW (ref 5–15)
BUN: 14 mg/dL (ref 8–23)
CO2: 30 mmol/L (ref 22–32)
Calcium: 8.6 mg/dL — ABNORMAL LOW (ref 8.9–10.3)
Chloride: 108 mmol/L (ref 98–111)
Creatinine: 0.78 mg/dL (ref 0.44–1.00)
GFR, Estimated: >60 mL/min (ref >60)
Glucose, Bld: 89 mg/dL (ref 70–99)
Potassium: 3.7 mmol/L (ref 3.5–5.1)
Sodium: 141 mmol/L (ref 135–145)
Total Bilirubin: 0.7 mg/dL (ref 0.3–1.2)
Total Protein: 6.7 g/dL (ref 6.5–8.1)

## 2021-10-22 LAB — SAMPLE TO BLOOD BANK

## 2021-10-22 LAB — IRON AND IRON BINDING CAPACITY (CC-WL,HP ONLY)
Iron: 96 ug/dL (ref 28–170)
Saturation Ratios: 35 % — ABNORMAL HIGH (ref 10.4–31.8)
TIBC: 279 ug/dL (ref 250–450)
UIBC: 183 ug/dL (ref 148–442)

## 2021-10-22 LAB — CBC WITH DIFFERENTIAL (CANCER CENTER ONLY)
Abs Immature Granulocytes: 0 10*3/uL (ref 0.00–0.07)
Basophils Absolute: 0 10*3/uL (ref 0.0–0.1)
Basophils Relative: 1 %
Eosinophils Absolute: 0 10*3/uL (ref 0.0–0.5)
Eosinophils Relative: 1 %
HCT: 39.5 % (ref 36.0–46.0)
Hemoglobin: 13.1 g/dL (ref 12.0–15.0)
Immature Granulocytes: 0 %
Lymphocytes Relative: 37 %
Lymphs Abs: 1.2 10*3/uL (ref 0.7–4.0)
MCH: 32 pg (ref 26.0–34.0)
MCHC: 33.2 g/dL (ref 30.0–36.0)
MCV: 96.3 fL (ref 80.0–100.0)
Monocytes Absolute: 0.3 10*3/uL (ref 0.1–1.0)
Monocytes Relative: 8 %
Neutro Abs: 1.8 10*3/uL (ref 1.7–7.7)
Neutrophils Relative %: 53 %
Platelet Count: 34 10*3/uL — ABNORMAL LOW (ref 150–400)
RBC: 4.1 MIL/uL (ref 3.87–5.11)
RDW: 14.6 % (ref 11.5–15.5)
WBC Count: 3.3 10*3/uL — ABNORMAL LOW (ref 4.0–10.5)
nRBC: 0 % (ref 0.0–0.2)

## 2021-10-22 LAB — FERRITIN: Ferritin: 403 ng/mL — ABNORMAL HIGH (ref 11–307)

## 2021-10-22 LAB — ABO/RH
ABO/RH(D): O POS
ABO/RH(D): O POS

## 2021-10-22 MED ORDER — SODIUM CHLORIDE 0.9% IV SOLUTION
250.0000 mL | Freq: Once | INTRAVENOUS | Status: AC
  Administered 2021-10-22: 250 mL via INTRAVENOUS

## 2021-10-22 MED ORDER — ACETAMINOPHEN 325 MG PO TABS
650.0000 mg | ORAL_TABLET | Freq: Once | ORAL | Status: AC
  Administered 2021-10-22: 650 mg via ORAL
  Filled 2021-10-22: qty 2

## 2021-10-22 MED ORDER — DIPHENHYDRAMINE HCL 25 MG PO CAPS
25.0000 mg | ORAL_CAPSULE | Freq: Once | ORAL | Status: AC
  Administered 2021-10-22: 25 mg via ORAL
  Filled 2021-10-22: qty 1

## 2021-10-22 NOTE — Patient Instructions (Signed)
Blood Transfusion, Adult A blood transfusion is a procedure in which you receive blood or a type of blood cell (blood component) through an IV. You may need a blood transfusion when you have a low blood count, which is a low number of any blood cell. This may result from a bleeding disorder, illness, injury, or surgery. The blood may come from a donor, or you may be able to have your own blood collected and stored (autologous blood donation) before a planned surgery. The blood given in a transfusion may be made up of different blood components. You may receive: Red blood cells. These carry oxygen to the cells in the body. Platelets. These help your blood to clot. Plasma. This is the liquid part of your blood. It carries proteins and other substances throughout the body. White blood cells. These help you fight infections. If you have hemophilia or another clotting disorder, you may also receive other types of blood products. Depending on the type of blood product, this procedure may take 1-4 hours to complete. Tell a health care provider about: Any bleeding problems you have. Any previous reactions you have had during a blood transfusion. Any allergies you have. All medicines you are taking, including vitamins, herbs, eye drops, creams, and over-the-counter medicines. Any surgeries you have had. Any medical conditions you have. Whether you are pregnant or may be pregnant. What are the risks? Talk with your health care provider about risks. The most common problems include: A mild allergic reaction, such as red, swollen areas of skin (hives) and itching. Fever or chills. This may be the body's response to new blood cells received. This may occur during or up to 4 hours after the transfusion. More serious problems may include: A serious allergic reaction that causes difficulty breathing or swelling around the face and lips. Transfusion-associated circulatory overload (TACO), or too much fluid in  the lungs. This may cause breathing problems. Transfusion-related acute lung injury (TRALI), which causes breathing difficulty and low oxygen in the blood. This can occur within hours of the transfusion or several days later. Iron overload. This can happen after receiving many blood transfusions over a period of time. Infection or virus being transmitted. This is rare because donated blood is carefully tested before it is given. Hemolytic transfusion reaction. This is rare. It happens when the body's defense system (immune system)tries to attack the new blood cells. Symptoms may include fever, chills, nausea, low blood pressure, and low back or chest pain. Transfusion-associated graft-versus-host disease (TAGVHD). This is rare. It happens when donated cells attack the body's healthy tissues. What happens before the procedure? You will have a blood test to check your blood type. This test is done to know what kind of blood your body will accept and to match it to the donor blood. If you are going to have a planned surgery, you may be able to do an autologous blood donation. This may be done in case you need to have a transfusion. You will have your temperature, blood pressure, and pulse checked before the transfusion. If you have had an allergic reaction to a transfusion in the past, you may be given medicine to help prevent a reaction. This medicine may be given to you by mouth (orally) or through an IV. What happens during the procedure?  An IV will be inserted into one of your veins. The bag of blood will be attached to your IV. The blood will then enter through your vein. Your temperature, blood pressure,   and pulse will be monitored during the transfusion. This monitoring is done to detect early signs of a transfusion reaction. Tell your nurse right away if you have any of these symptoms during the transfusion: Shortness of breath or trouble breathing. Chest or back pain. Fever or  chills. Itching or hives. If you have any signs or symptoms of a reaction, your transfusion will be stopped and you may be given medicine. When the transfusion is complete, your IV will be removed. Pressure may be applied to the IV site for a few minutes. A bandage (dressing)will be applied. The procedure may vary among health care providers and hospitals. What happens after the procedure? Your temperature, blood pressure, pulse, breathing rate, and blood oxygen level will be monitored until you leave the hospital or clinic. Your blood may be tested to see how you have responded to the transfusion. You may be warmed with fluids or blankets to maintain a normal body temperature. If you receive your blood transfusion in an outpatient setting, you will be told whom to contact to report any reactions. Where to find more information Visit the [employer]: redcross.org Summary A blood transfusion is a procedure in which you receive blood or a type of blood cell (blood component) through an IV. The blood given in a transfusion may be made up of different blood components. You may receive red blood cells, platelets, plasma, or white blood cells depending on the condition treated. Your temperature, blood pressure, and pulse will be monitored before, during, and after the transfusion. After the transfusion, your blood may be tested to see how your body has responded. This information is not intended to replace advice given to you by your health care provider. Make sure you discuss any questions you have with your health care provider. Document Revised: 05/16/2021 Document Reviewed: 05/16/2021 Elsevier Patient Education  2023 [employer] Inc.  

## 2021-10-22 NOTE — Progress Notes (Signed)
Pt plt 34.  Verbal orders received from MD for pt to receive 1 unit platelets today.  Orders placed.

## 2021-10-22 NOTE — Assessment & Plan Note (Addendum)
Left breast invasive ductal carcinoma ER/PR positive HER-2 negative Ki-67 94% diagnosed October 2013 status post 4 cycles of [place] followed by left mastectomy which revealed a 0.8 cm grade 1 IDC, 1 sentinel node micrometastatic disease ER/PR 100% positive status post radiation therapy and completed adjuvant trastuzumab 01/12/2013 currently on Arimidex 1 mg daily from 05/30/2014completed May 2021  Breast cancer surveillance: Right breast mammograms: [facility].  She had a recent mammogram we will try to get the results.   Basal cell carcinoma on the face: To be removed today 09/18/2020

## 2021-10-22 NOTE — Assessment & Plan Note (Addendum)
MDS: Labs reviewed 06/26/2020: Platelet count 44 and white count 3, ANC 1.8 . 09/18/2020: Platelets 44 WBC 3 10/22/2021: WBC 3.3, hemoglobin 13.1, platelets 34  Retinal bleed: Recent ophthalmology visit showed that she had a retinal bleed and is going to see a retina specialist.  With the thrombocytopenia getting worse we will plan to administer a unit of platelets today in case this bleed was going to get any worse.     Return to clinic every 6 months with labs and follow-up

## 2021-10-23 LAB — PREPARE PLATELET PHERESIS: Unit division: 0

## 2021-10-23 LAB — BPAM PLATELET PHERESIS
Blood Product Expiration Date: 202308252359
ISSUE DATE / TIME: 202308231210
Unit Type and Rh: 5100

## 2021-10-24 ENCOUNTER — Telehealth: Payer: Self-pay | Admitting: Hematology and Oncology

## 2021-10-24 DIAGNOSIS — H43813 Vitreous degeneration, bilateral: Secondary | ICD-10-CM | POA: Diagnosis not present

## 2021-10-24 DIAGNOSIS — H3561 Retinal hemorrhage, right eye: Secondary | ICD-10-CM | POA: Diagnosis not present

## 2021-10-24 DIAGNOSIS — H35371 Puckering of macula, right eye: Secondary | ICD-10-CM | POA: Diagnosis not present

## 2021-10-24 NOTE — Telephone Encounter (Signed)
Scheduled appointment per 8/23 los. Left voicemail.

## 2021-10-29 DIAGNOSIS — N958 Other specified menopausal and perimenopausal disorders: Secondary | ICD-10-CM | POA: Diagnosis not present

## 2021-10-29 DIAGNOSIS — Z1231 Encounter for screening mammogram for malignant neoplasm of breast: Secondary | ICD-10-CM | POA: Diagnosis not present

## 2021-10-29 DIAGNOSIS — Z6833 Body mass index (BMI) 33.0-33.9, adult: Secondary | ICD-10-CM | POA: Diagnosis not present

## 2021-10-29 DIAGNOSIS — Z124 Encounter for screening for malignant neoplasm of cervix: Secondary | ICD-10-CM | POA: Diagnosis not present

## 2021-10-29 DIAGNOSIS — M8588 Other specified disorders of bone density and structure, other site: Secondary | ICD-10-CM | POA: Diagnosis not present

## 2021-10-29 DIAGNOSIS — Z779 Other contact with and (suspected) exposures hazardous to health: Secondary | ICD-10-CM | POA: Diagnosis not present

## 2021-10-29 DIAGNOSIS — R29890 Loss of height: Secondary | ICD-10-CM | POA: Diagnosis not present

## 2021-11-05 DIAGNOSIS — S0512XA Contusion of eyeball and orbital tissues, left eye, initial encounter: Secondary | ICD-10-CM | POA: Diagnosis not present

## 2021-11-05 DIAGNOSIS — H1132 Conjunctival hemorrhage, left eye: Secondary | ICD-10-CM | POA: Diagnosis not present

## 2021-11-05 DIAGNOSIS — H3561 Retinal hemorrhage, right eye: Secondary | ICD-10-CM | POA: Diagnosis not present

## 2021-11-05 DIAGNOSIS — H35371 Puckering of macula, right eye: Secondary | ICD-10-CM | POA: Diagnosis not present

## 2021-11-21 DIAGNOSIS — Z23 Encounter for immunization: Secondary | ICD-10-CM | POA: Diagnosis not present

## 2021-11-21 DIAGNOSIS — D469 Myelodysplastic syndrome, unspecified: Secondary | ICD-10-CM | POA: Diagnosis not present

## 2021-11-21 DIAGNOSIS — I10 Essential (primary) hypertension: Secondary | ICD-10-CM | POA: Diagnosis not present

## 2021-11-21 DIAGNOSIS — Z6832 Body mass index (BMI) 32.0-32.9, adult: Secondary | ICD-10-CM | POA: Diagnosis not present

## 2021-12-01 ENCOUNTER — Other Ambulatory Visit: Payer: Self-pay | Admitting: Hematology and Oncology

## 2021-12-01 DIAGNOSIS — D46Z Other myelodysplastic syndromes: Secondary | ICD-10-CM

## 2021-12-01 DIAGNOSIS — F5101 Primary insomnia: Secondary | ICD-10-CM

## 2021-12-01 MED ORDER — ZOLPIDEM TARTRATE ER 12.5 MG PO TBCR: 12.5000 mg | EXTENDED_RELEASE_TABLET | Freq: Every evening | ORAL | 3 refills | Status: DC | PRN

## 2022-01-21 NOTE — Progress Notes (Signed)
Patient Care Team: Lawerance Cruel, MD as PCP - General (Family Medicine) Bo Merino, MD as Consulting Physician (Rheumatology)  DIAGNOSIS:  Encounter Diagnoses  Name Primary?   Malignant neoplasm of upper-inner quadrant of left breast in female, estrogen receptor positive ([place]) Yes   MDS (myelodysplastic syndrome), low grade ([place])     SUMMARY OF ONCOLOGIC HISTORY: Oncology History  Breast cancer of upper-inner quadrant of left female breast ([place])  12/31/2011 - 03/11/2012 Neo-Adjuvant Chemotherapy   Taxotere Herceptin Perjeta 4 followed by Herceptin maintenance completed 01/29/2013   04/06/2012 Surgery   Left breast mastectomy: IDC grade 1; 0.8 cm, low-grade DCIS, LV I identified, 1/2 lymph node Micro met, ER/PR positive HER-2 positive Ki-67 94%   05/04/2012 - 06/20/2012 Radiation Therapy   Adjuvant radiation therapy   07/29/2012 -  Anti-estrogen oral therapy   Arimidex 1 mg daily   10/17/2014 Procedure   Bone marrow biopsy for thrombocytopenia and leukopenia: Hypercellular marrow with trilineage dysplasia most apparent in megakaryocytes with associated myelofibrosis: low-grade MDS with refractory cytopenias and multilineage dysplasia (20q- partial del)   MDS (myelodysplastic syndrome), low grade ([place])  10/17/2014 Initial Diagnosis   MDS (myelodysplastic syndrome), low grade , refractory cytopenia with multi nature dysplasia cytogenetics -20q (associated with good prognosis MDS)     CHIEF COMPLIANT: Follow-up of left breast cancer  and MDS   INTERVAL HISTORY: SHAKALA MARLATT is a 76 y.o. with above-mentioned history of left breast cancer and MDS who underwent neoadjuvant chemotherapy, left mastectomy, radiation, and is currently on anastrozole. She also has a history of MDS. She presents to the clinic today for follow-up. S he reports that her eye is doing well. She says that the bleeding has subsided.   ALLERGIES:  is allergic to cephalexin, cephalosporins, codeine,  and tape.  MEDICATIONS:  Current Outpatient Medications  Medication Sig Dispense Refill   Calcium Carbonate-Vit D-Min (CALCIUM 1200 PO) Take 1 tablet by mouth every morning.     cholecalciferol (VITAMIN D) 1000 UNITS tablet Take by mouth daily.      cyanocobalamin 100 MCG tablet Take 100 mcg by mouth daily.     fluocinonide-emollient (LIDEX-E) 0.05 % cream Apply 1 application topically daily as needed (welps and rash.).  (Patient not taking: Reported on 06/02/2021)     gabapentin (NEURONTIN) 300 MG capsule Take 300 mg by mouth at bedtime.     hydrochlorothiazide (HYDRODIURIL) 25 MG tablet TAKE 1 TABLET BY MOUTH EVERY DAY FOR 30 DAYS     metoprolol succinate (TOPROL-XL) 100 MG 24 hr tablet Take 1 tablet (100 mg total) by mouth daily. Take with or immediately following a meal. 90 tablet 3   pilocarpine (PILOCAR) 1 % ophthalmic solution 1 drop into affected eye Ophthalmic Three times a day     pilocarpine (SALAGEN) 5 MG tablet      vitamin E 200 UNIT capsule Take 200 Units by mouth every morning.      zolpidem (AMBIEN CR) 12.5 MG CR tablet Take 1 tablet (12.5 mg total) by mouth at bedtime as needed. for sleep 30 tablet 3   No current facility-administered medications for this visit.    PHYSICAL EXAMINATION: ECOG PERFORMANCE STATUS: 1 - Symptomatic but completely ambulatory  Vitals:   01/26/22 0934  BP: 136/72  Pulse: 76  Resp: 18  Temp: (!) 97.5 F (36.4 C)  SpO2: 95%   Filed Weights   01/26/22 0934  Weight: 191 lb 4.8 oz (86.8 kg)      LABORATORY DATA:  I have reviewed the data as listed    Latest Ref Rng & Units 10/22/2021    9:54 AM 09/18/2020    8:20 AM 06/26/2020   10:19 AM  CMP  Glucose 70 - 99 mg/dL 89  110  97   BUN 8 - 23 mg/dL _0 Creatinine 0.44 - 1.00 mg/dL 0.78  0.77  0.77   Sodium 135 - 145 mmol/L 141  142  143   Potassium 3.5 - 5.1 mmol/L 3.7  4.3  3.9   Chloride 98 - 111 mmol/L 108  106  108   CO2 22 - 32 mmol/L _1 Calcium 8.9 - 10.3  mg/dL 8.6  8.8  8.6   Total Protein 6.5 - 8.1 g/dL 6.7  7.2  6.9   Total Bilirubin 0.3 - 1.2 mg/dL 0.7  0.8  0.6   Alkaline Phos 38 - 126 U/L 64  70  65   AST 15 - 41 U/L _2 ALT 0 - 44 U/L _3 Lab Results  Component Value Date   WBC 3.6 (L) 01/26/2022   HGB 13.0 01/26/2022   HCT 39.4 01/26/2022   MCV 97.3 01/26/2022   PLT 34 (L) 01/26/2022   NEUTROABS 2.0 01/26/2022    ASSESSMENT & PLAN:  Breast cancer of upper-inner quadrant of left female breast ([place]) Left breast invasive ductal carcinoma ER/PR positive HER-2 negative Ki-67 94% diagnosed October 2013 status post 4 cycles of [place] followed by left mastectomy which revealed a 0.8 cm grade 1 IDC, 1 sentinel node micrometastatic disease ER/PR 100% positive status post radiation therapy and completed adjuvant trastuzumab 01/12/2013 currently on Arimidex 1 mg daily from 07/29/2012 completed May 2021   Breast cancer surveillance:  Right breast mammograms: [facility].     Basal cell carcinoma on the face: Removed 09/18/2020  MDS (myelodysplastic syndrome), low grade Labs reviewed 06/26/2020: Platelet count 44 and white count 3, ANC 1.8 . 09/18/2020: Platelets 44 WBC 3 10/22/2021: WBC 3.3, hemoglobin 13.1, platelets 34 01/26/22: WBC 3.6, hemoglobin 13, platelets 34   Retinal bleed: Appears to resolved We will continue to watch and monitor Recheck labs in 4 months and follow-up  No orders of the defined types were placed in this encounter.  The patient has a good understanding of the overall plan. she agrees with it. she will call with any problems that may develop before the next visit here. Total time spent: 30 mins including face to face time and time spent for planning, charting and co-ordination of care   Harriette Ohara, MD 01/26/22    I Gardiner Coins am scribing for Dr. Lindi Adie  I have reviewed the above documentation for accuracy and completeness, and I agree with the above.

## 2022-01-23 ENCOUNTER — Other Ambulatory Visit: Payer: Self-pay | Admitting: *Deleted

## 2022-01-23 DIAGNOSIS — C50212 Malignant neoplasm of upper-inner quadrant of left female breast: Secondary | ICD-10-CM

## 2022-01-26 ENCOUNTER — Inpatient Hospital Stay: Payer: Medicare Other | Attending: Hematology and Oncology

## 2022-01-26 ENCOUNTER — Inpatient Hospital Stay (HOSPITAL_BASED_OUTPATIENT_CLINIC_OR_DEPARTMENT_OTHER): Payer: Medicare Other | Admitting: Hematology and Oncology

## 2022-01-26 ENCOUNTER — Other Ambulatory Visit: Payer: Self-pay

## 2022-01-26 VITALS — BP 136/72 | HR 76 | Temp 97.5°F | Resp 18 | Ht 65.0 in | Wt 191.3 lb

## 2022-01-26 DIAGNOSIS — D46Z Other myelodysplastic syndromes: Secondary | ICD-10-CM

## 2022-01-26 DIAGNOSIS — C50212 Malignant neoplasm of upper-inner quadrant of left female breast: Secondary | ICD-10-CM | POA: Insufficient documentation

## 2022-01-26 DIAGNOSIS — Z79899 Other long term (current) drug therapy: Secondary | ICD-10-CM | POA: Diagnosis not present

## 2022-01-26 DIAGNOSIS — Z923 Personal history of irradiation: Secondary | ICD-10-CM | POA: Insufficient documentation

## 2022-01-26 DIAGNOSIS — D4620 Refractory anemia with excess of blasts, unspecified: Secondary | ICD-10-CM | POA: Insufficient documentation

## 2022-01-26 DIAGNOSIS — Z7982 Long term (current) use of aspirin: Secondary | ICD-10-CM | POA: Diagnosis not present

## 2022-01-26 DIAGNOSIS — Z170 Estrogen receptor positive status [ER+]: Secondary | ICD-10-CM | POA: Insufficient documentation

## 2022-01-26 DIAGNOSIS — Z9012 Acquired absence of left breast and nipple: Secondary | ICD-10-CM | POA: Insufficient documentation

## 2022-01-26 DIAGNOSIS — Z79811 Long term (current) use of aromatase inhibitors: Secondary | ICD-10-CM | POA: Insufficient documentation

## 2022-01-26 LAB — CMP (CANCER CENTER ONLY)
ALT: 11 U/L (ref 0–44)
AST: 18 U/L (ref 15–41)
Albumin: 4.1 g/dL (ref 3.5–5.0)
Alkaline Phosphatase: 56 U/L (ref 38–126)
Anion gap: 5 (ref 5–15)
BUN: 13 mg/dL (ref 8–23)
CO2: 31 mmol/L (ref 22–32)
Calcium: 9.1 mg/dL (ref 8.9–10.3)
Chloride: 105 mmol/L (ref 98–111)
Creatinine: 0.82 mg/dL (ref 0.44–1.00)
GFR, Estimated: >60 mL/min (ref >60)
Glucose, Bld: 93 mg/dL (ref 70–99)
Potassium: 3.7 mmol/L (ref 3.5–5.1)
Sodium: 141 mmol/L (ref 135–145)
Total Bilirubin: 0.8 mg/dL (ref 0.3–1.2)
Total Protein: 7.1 g/dL (ref 6.5–8.1)

## 2022-01-26 LAB — CBC WITH DIFFERENTIAL (CANCER CENTER ONLY)
Abs Immature Granulocytes: 0 10*3/uL (ref 0.00–0.07)
Basophils Absolute: 0 10*3/uL (ref 0.0–0.1)
Basophils Relative: 1 %
Eosinophils Absolute: 0 10*3/uL (ref 0.0–0.5)
Eosinophils Relative: 1 %
HCT: 39.4 % (ref 36.0–46.0)
Hemoglobin: 13 g/dL (ref 12.0–15.0)
Immature Granulocytes: 0 %
Lymphocytes Relative: 34 %
Lymphs Abs: 1.2 10*3/uL (ref 0.7–4.0)
MCH: 32.1 pg (ref 26.0–34.0)
MCHC: 33 g/dL (ref 30.0–36.0)
MCV: 97.3 fL (ref 80.0–100.0)
Monocytes Absolute: 0.3 10*3/uL (ref 0.1–1.0)
Monocytes Relative: 9 %
Neutro Abs: 2 10*3/uL (ref 1.7–7.7)
Neutrophils Relative %: 55 %
Platelet Count: 34 10*3/uL — ABNORMAL LOW (ref 150–400)
RBC: 4.05 MIL/uL (ref 3.87–5.11)
RDW: 14.3 % (ref 11.5–15.5)
WBC Count: 3.6 10*3/uL — ABNORMAL LOW (ref 4.0–10.5)
nRBC: 0 % (ref 0.0–0.2)

## 2022-01-26 LAB — IRON AND IRON BINDING CAPACITY (CC-WL,HP ONLY)
Iron: 120 ug/dL (ref 28–170)
Saturation Ratios: 44 % — ABNORMAL HIGH (ref 10.4–31.8)
TIBC: 274 ug/dL (ref 250–450)
UIBC: 154 ug/dL (ref 148–442)

## 2022-01-26 LAB — SAMPLE TO BLOOD BANK

## 2022-01-26 LAB — FERRITIN: Ferritin: 466 ng/mL — ABNORMAL HIGH (ref 11–307)

## 2022-01-26 NOTE — Assessment & Plan Note (Signed)
Labs reviewed 06/26/2020: Platelet count 44 and white count 3, ANC 1.8 . 09/18/2020: Platelets 44 WBC 3 10/22/2021: WBC 3.3, hemoglobin 13.1, platelets 34   Retinal bleed: Recent ophthalmology visit showed that she had a retinal bleed and is going to see a retina specialist.

## 2022-01-26 NOTE — Assessment & Plan Note (Signed)
Left breast invasive ductal carcinoma ER/PR positive HER-2 negative Ki-67 94% diagnosed October 2013 status post 4 cycles of [place] followed by left mastectomy which revealed a 0.8 cm grade 1 IDC, 1 sentinel node micrometastatic disease ER/PR 100% positive status post radiation therapy and completed adjuvant trastuzumab 01/12/2013 currently on Arimidex 1 mg daily from 07/29/2012 completed May 2021   Breast cancer surveillance:  Right breast mammograms: [facility].     Basal cell carcinoma on the face: Removed 09/18/2020

## 2022-03-18 DIAGNOSIS — H35371 Puckering of macula, right eye: Secondary | ICD-10-CM | POA: Diagnosis not present

## 2022-03-18 DIAGNOSIS — H3561 Retinal hemorrhage, right eye: Secondary | ICD-10-CM | POA: Diagnosis not present

## 2022-04-23 ENCOUNTER — Telehealth: Payer: Self-pay | Admitting: *Deleted

## 2022-04-23 NOTE — Telephone Encounter (Signed)
Received call from pt with complaint of intermittent post nasal drip/ cold symptoms x 2 weeks. Pt states symptoms do not interferer with ADLS but wanted our office to make a note in her chart. Pt educated to f/u with PCP for symptoms.  Pt verbalized understanding.

## 2022-04-27 ENCOUNTER — Other Ambulatory Visit: Payer: Self-pay | Admitting: Hematology and Oncology

## 2022-04-27 DIAGNOSIS — F5101 Primary insomnia: Secondary | ICD-10-CM

## 2022-04-27 DIAGNOSIS — D46Z Other myelodysplastic syndromes: Secondary | ICD-10-CM

## 2022-04-28 ENCOUNTER — Other Ambulatory Visit: Payer: Self-pay | Admitting: Hematology and Oncology

## 2022-04-28 DIAGNOSIS — F5101 Primary insomnia: Secondary | ICD-10-CM

## 2022-04-28 DIAGNOSIS — D46Z Other myelodysplastic syndromes: Secondary | ICD-10-CM

## 2022-04-28 MED ORDER — ZOLPIDEM TARTRATE ER 12.5 MG PO TBCR: 12.5000 mg | EXTENDED_RELEASE_TABLET | Freq: Every evening | ORAL | 3 refills | Status: DC | PRN

## 2022-05-22 ENCOUNTER — Telehealth: Payer: Self-pay

## 2022-05-22 NOTE — Patient Outreach (Signed)
  Care Coordination   05/22/2022 Name: Marie Anderson MRN: [id number] DOB: 1945/07/29   Care Coordination Outreach Attempts:  An unsuccessful telephone outreach was attempted today to offer the patient information about available care coordination services as a benefit of their health plan.   Follow Up Plan:  Additional outreach attempts will be made to offer the patient care coordination information and services.   Encounter Outcome:  No Answer   Care Coordination Interventions:  No, not indicated    SIG  Peter Garter RN, BSN,CCM, CDE Care Management Coordinator [facility] [phone]

## 2022-05-27 NOTE — Progress Notes (Signed)
Patient Care Team: Lawerance Cruel, MD as PCP - General (Family Medicine) Bo Merino, MD as Consulting Physician (Rheumatology)  DIAGNOSIS: No diagnosis found.  SUMMARY OF ONCOLOGIC HISTORY: Oncology History  Breast cancer of upper-inner quadrant of left female breast ([place])  12/31/2011 - 03/11/2012 Neo-Adjuvant Chemotherapy   Taxotere Herceptin Perjeta 4 followed by Herceptin maintenance completed 01/29/2013   04/06/2012 Surgery   Left breast mastectomy: IDC grade 1; 0.8 cm, low-grade DCIS, LV I identified, 1/2 lymph node Micro met, ER/PR positive HER-2 positive Ki-67 94%   05/04/2012 - 06/20/2012 Radiation Therapy   Adjuvant radiation therapy   07/29/2012 -  Anti-estrogen oral therapy   Arimidex 1 mg daily   10/17/2014 Procedure   Bone marrow biopsy for thrombocytopenia and leukopenia: Hypercellular marrow with trilineage dysplasia most apparent in megakaryocytes with associated myelofibrosis: low-grade MDS with refractory cytopenias and multilineage dysplasia (20q- partial del)   MDS (myelodysplastic syndrome), low grade ([place])  10/17/2014 Initial Diagnosis   MDS (myelodysplastic syndrome), low grade , refractory cytopenia with multi nature dysplasia cytogenetics -20q (associated with good prognosis MDS)     CHIEF COMPLIANT: Follow-up of left breast cancer  and MDS   INTERVAL HISTORY: Marie Anderson is a 77 y.o. with above-mentioned history of left breast cancer and MDS who underwent neoadjuvant chemotherapy, left mastectomy, radiation, and is currently on anastrozole. She also has a history of MDS. She presents to the clinic today for follow-up.    ALLERGIES:  is allergic to cephalexin, cephalosporins, codeine, and tape.  MEDICATIONS:  Current Outpatient Medications  Medication Sig Dispense Refill   Calcium Carbonate-Vit D-Min (CALCIUM 1200 PO) Take 1 tablet by mouth every morning.     cholecalciferol (VITAMIN D) 1000 UNITS tablet Take by mouth daily.       cyanocobalamin 100 MCG tablet Take 100 mcg by mouth daily.     fluocinonide-emollient (LIDEX-E) 0.05 % cream Apply 1 application topically daily as needed (welps and rash.).  (Patient not taking: Reported on 06/02/2021)     gabapentin (NEURONTIN) 300 MG capsule Take 300 mg by mouth at bedtime.     hydrochlorothiazide (HYDRODIURIL) 25 MG tablet TAKE 1 TABLET BY MOUTH EVERY DAY FOR 30 DAYS     metoprolol succinate (TOPROL-XL) 100 MG 24 hr tablet Take 1 tablet (100 mg total) by mouth daily. Take with or immediately following a meal. 90 tablet 3   pilocarpine (PILOCAR) 1 % ophthalmic solution 1 drop into affected eye Ophthalmic Three times a day     pilocarpine (SALAGEN) 5 MG tablet      vitamin E 200 UNIT capsule Take 200 Units by mouth every morning.      zolpidem (AMBIEN CR) 12.5 MG CR tablet Take 1 tablet (12.5 mg total) by mouth at bedtime as needed. for sleep 30 tablet 3   No current facility-administered medications for this visit.    PHYSICAL EXAMINATION: ECOG PERFORMANCE STATUS: {CHL ONC ECOG [id number]}  There were no vitals filed for this visit. There were no vitals filed for this visit.  BREAST:*** No palpable masses or nodules in either right or left breasts. No palpable axillary supraclavicular or infraclavicular adenopathy no breast tenderness or nipple discharge. (exam performed in the presence of a chaperone)  LABORATORY DATA:  I have reviewed the data as listed    Latest Ref Rng & Units 01/26/2022    9:17 AM 10/22/2021    9:54 AM 09/18/2020    8:20 AM  CMP  Glucose 70 - 99 mg/dL 93  89  110   BUN 8 - 23 mg/dL 13  14  11    Creatinine 0.44 - 1.00 mg/dL 0.82  0.78  0.77   Sodium 135 - 145 mmol/L 141  141  142   Potassium 3.5 - 5.1 mmol/L 3.7  3.7  4.3   Chloride 98 - 111 mmol/L 105  108  106   CO2 22 - 32 mmol/L 31  30  30    Calcium 8.9 - 10.3 mg/dL 9.1  8.6  8.8   Total Protein 6.5 - 8.1 g/dL 7.1  6.7  7.2   Total Bilirubin 0.3 - 1.2 mg/dL 0.8  0.7  0.8   Alkaline  Phos 38 - 126 U/L 56  64  70   AST 15 - 41 U/L 18  18  23    ALT 0 - 44 U/L 11  11  16      Lab Results  Component Value Date   WBC 3.6 (L) 01/26/2022   HGB 13.0 01/26/2022   HCT 39.4 01/26/2022   MCV 97.3 01/26/2022   PLT 34 (L) 01/26/2022   NEUTROABS 2.0 01/26/2022    ASSESSMENT & PLAN:  No problem-specific Assessment & Plan notes found for this encounter.    No orders of the defined types were placed in this encounter.  The patient has a good understanding of the overall plan. she agrees with it. she will call with any problems that may develop before the next visit here. Total time spent: 30 mins including face to face time and time spent for planning, charting and co-ordination of care   Suzzette Righter, [place] 05/27/22    I Gardiner Coins am acting as a Education administrator for [employer]  ***

## 2022-05-28 ENCOUNTER — Other Ambulatory Visit: Payer: Self-pay | Admitting: Cardiology

## 2022-05-28 DIAGNOSIS — I471 Supraventricular tachycardia, unspecified: Secondary | ICD-10-CM

## 2022-06-02 NOTE — Assessment & Plan Note (Signed)
Labs reviewed 06/26/2020: Platelet count 44 and white count 3, ANC 1.8 . 09/18/2020: Platelets 44 WBC 3 10/22/2021: WBC 3.3, hemoglobin 13.1, platelets 34 01/26/22: WBC 3.6, hemoglobin 13, platelets 34 06/03/2022: WBC 2.8, hemoglobin 13.7, platelets 40, ANC 1.5   Retinal bleed: Appears to resolved We will continue to watch and monitor Recheck labs in 4 months and follow-up

## 2022-06-02 NOTE — Assessment & Plan Note (Signed)
Left breast invasive ductal carcinoma ER/PR positive HER-2 negative Ki-67 94% diagnosed October 2013 status post 4 cycles of TCH Perjeta followed by left mastectomy which revealed a 0.8 cm grade 1 IDC, 1 sentinel node micrometastatic disease ER/PR 100% positive status post radiation therapy and completed adjuvant trastuzumab 01/12/2013 currently on Arimidex 1 mg daily from 07/29/2012 completed May 2021   Breast cancer surveillance:  Right breast mammograms: [facility].     Basal cell carcinoma on the face: Removed 09/18/2020 

## 2022-06-03 ENCOUNTER — Inpatient Hospital Stay (HOSPITAL_BASED_OUTPATIENT_CLINIC_OR_DEPARTMENT_OTHER): Payer: Medicare Other | Admitting: Hematology and Oncology

## 2022-06-03 ENCOUNTER — Other Ambulatory Visit: Payer: Self-pay

## 2022-06-03 ENCOUNTER — Inpatient Hospital Stay: Payer: Medicare Other | Attending: Hematology and Oncology

## 2022-06-03 VITALS — BP 132/73 | HR 73 | Temp 97.2°F | Resp 18 | Ht 65.0 in | Wt 189.3 lb

## 2022-06-03 DIAGNOSIS — Z170 Estrogen receptor positive status [ER+]: Secondary | ICD-10-CM | POA: Diagnosis not present

## 2022-06-03 DIAGNOSIS — D46Z Other myelodysplastic syndromes: Secondary | ICD-10-CM | POA: Diagnosis not present

## 2022-06-03 DIAGNOSIS — C50212 Malignant neoplasm of upper-inner quadrant of left female breast: Secondary | ICD-10-CM | POA: Insufficient documentation

## 2022-06-03 DIAGNOSIS — Z9012 Acquired absence of left breast and nipple: Secondary | ICD-10-CM | POA: Insufficient documentation

## 2022-06-03 DIAGNOSIS — Z79811 Long term (current) use of aromatase inhibitors: Secondary | ICD-10-CM | POA: Diagnosis not present

## 2022-06-03 DIAGNOSIS — D4620 Refractory anemia with excess of blasts, unspecified: Secondary | ICD-10-CM | POA: Diagnosis not present

## 2022-06-03 DIAGNOSIS — M545 Low back pain, unspecified: Secondary | ICD-10-CM | POA: Diagnosis not present

## 2022-06-03 DIAGNOSIS — Z85828 Personal history of other malignant neoplasm of skin: Secondary | ICD-10-CM | POA: Diagnosis not present

## 2022-06-03 DIAGNOSIS — Z923 Personal history of irradiation: Secondary | ICD-10-CM | POA: Insufficient documentation

## 2022-06-03 LAB — CBC WITH DIFFERENTIAL (CANCER CENTER ONLY)
Abs Immature Granulocytes: 0.01 10*3/uL (ref 0.00–0.07)
Basophils Absolute: 0 10*3/uL (ref 0.0–0.1)
Basophils Relative: 1 %
Eosinophils Absolute: 0 10*3/uL (ref 0.0–0.5)
Eosinophils Relative: 1 %
HCT: 40.7 % (ref 36.0–46.0)
Hemoglobin: 13.7 g/dL (ref 12.0–15.0)
Immature Granulocytes: 0 %
Lymphocytes Relative: 38 %
Lymphs Abs: 1.1 10*3/uL (ref 0.7–4.0)
MCH: 32.9 pg (ref 26.0–34.0)
MCHC: 33.7 g/dL (ref 30.0–36.0)
MCV: 97.6 fL (ref 80.0–100.0)
Monocytes Absolute: 0.2 10*3/uL (ref 0.1–1.0)
Monocytes Relative: 8 %
Neutro Abs: 1.5 10*3/uL — ABNORMAL LOW (ref 1.7–7.7)
Neutrophils Relative %: 52 %
Platelet Count: 40 10*3/uL — ABNORMAL LOW (ref 150–400)
RBC: 4.17 MIL/uL (ref 3.87–5.11)
RDW: 14.1 % (ref 11.5–15.5)
WBC Count: 2.8 10*3/uL — ABNORMAL LOW (ref 4.0–10.5)
nRBC: 0 % (ref 0.0–0.2)

## 2022-06-04 ENCOUNTER — Telehealth: Payer: Self-pay

## 2022-06-04 NOTE — Patient Outreach (Signed)
  Care Coordination   06/04/2022 Name: ALEIZA HAMMITT MRN: [id number] DOB: Jul 14, 1945   Care Coordination Outreach Attempts:  A second unsuccessful outreach was attempted today to offer the patient with information about available care coordination services as a benefit of their health plan.     Follow Up Plan:  Additional outreach attempts will be made to offer the patient care coordination information and services.   Encounter Outcome:  No Answer   Care Coordination Interventions:  No, not indicated    SIG Peter Garter RN, BSN,CCM, CDE Care Management Coordinator [place] [facility] [phone]

## 2022-06-17 ENCOUNTER — Telehealth: Payer: Self-pay

## 2022-06-17 NOTE — Patient Outreach (Signed)
  Care Coordination   06/17/2022 Name: Marie Anderson MRN: [id number] DOB: 11/20/1945   Care Coordination Outreach Attempts:  A third unsuccessful outreach was attempted today to offer the patient with information about available care coordination services as a benefit of their health plan.   Follow Up Plan:  No further outreach attempts will be made at this time. We have been unable to contact the patient to offer or enroll patient in care coordination services  Encounter Outcome:  No Answer   Care Coordination Interventions:  No, not indicated    SIG  Dudley Major RN, BSN,CCM, CDE Care Management Coordinator [facility] [phone]

## 2022-06-18 ENCOUNTER — Telehealth: Payer: Self-pay

## 2022-06-18 ENCOUNTER — Telehealth: Payer: Self-pay | Admitting: *Deleted

## 2022-06-18 NOTE — Patient Outreach (Signed)
  Care Coordination   Initial Visit Note   06/18/2022 Name: Marie Anderson MRN: [id number] DOB: 1945-08-11  Marie Anderson is a 77 y.o. year old female who sees Daisy Floro, MD for primary care. I spoke with  [name] by phone today.  What matters to the patients health and wellness today?  No concerns today    Goals Addressed             This Visit's Progress    COMPLETED: Care Coordination Activities- No Follow Up Required       Interventions Today    Flowsheet Row Most Recent Value  General Interventions   General Interventions Discussed/Reviewed General Interventions Discussed, Doctor Visits  [Declines needing any further RNCM interventions]  Doctor Visits Discussed/Reviewed Doctor Visits Discussed, Annual Wellness Visits, PCP  Education Interventions   Education Provided Provided Education  Provided Verbal Education On Other  [care coordination services]              SDOH assessments and interventions completed:  Yes  SDOH Interventions Today    Flowsheet Row Most Recent Value  SDOH Interventions   Food Insecurity Interventions Intervention Not Indicated  Housing Interventions Intervention Not Indicated  Transportation Interventions Intervention Not Indicated  Utilities Interventions Intervention Not Indicated        Care Coordination Interventions:  Yes, provided   Follow up plan: No further intervention required.   Encounter Outcome:  Pt. Visit Completed  Dudley Major RN, BSN,CCM, CDE Care Management Coordinator [facility] [phone]

## 2022-06-18 NOTE — Patient Instructions (Signed)
Visit Information  Thank you for taking time to visit with me today. Please don't hesitate to contact me if I can be of assistance to you.   Following are the goals we discussed today:   Goals Addressed             This Visit's Progress    COMPLETED: Care Coordination Activities- No Follow Up Required       Interventions Today    Flowsheet Row Most Recent Value  General Interventions   General Interventions Discussed/Reviewed General Interventions Discussed, Doctor Visits  [Declines needing any further RNCM interventions]  Doctor Visits Discussed/Reviewed Doctor Visits Discussed, Annual Wellness Visits, PCP  Education Interventions   Education Provided Provided Education  Provided Verbal Education On Other  [care coordination services]               If you are experiencing a Mental Health or Behavioral Health Crisis or need someone to talk to, please call the Suicide and Crisis Lifeline: 988 call the [place] National Suicide Prevention Lifeline: 725-444-5571 or TTY: 438-698-9860 TTY 912-802-1936) to talk to a trained counselor call 1-800-273-TALK (toll free, 24 hour hotline) go to [facility] [address], [place] [phone]) call 911   Patient verbalizes understanding of instructions and care plan provided today and agrees to view in MyChart. Active MyChart status and patient understanding of how to access instructions and care plan via MyChart confirmed with patient.     No further follow up required: .  SIGNATURE Dudley Major RN, Maximiano Coss, CDE Care Management Coordinator [facility] [phone]

## 2022-06-18 NOTE — Telephone Encounter (Signed)
Received call from pt requesting RN to place call to pharmacy to request early refill on Ambien.  Pt states she will be out of the country 06/23/22 to 07/01/22 and will run out.  RN reviewed with MD who states prescription is to be taken on an as needed basis but okay to early fill for trip.  RN placed call to pharmacy on file and spoke with Tammy the pharmacist for verbal okay to fill early.  RN encouraged pt to f/u with PCP on her return to discuss sleep management.  Pt verbalized understanding.

## 2022-07-21 NOTE — Progress Notes (Unsigned)
Cardiology Office Note:    Date:  07/22/2022   ID:  Marie Anderson, DOB 1945/10/22, MRN [id number]  PCP:  Daisy Floro, MD [facility] Cardiologist: Peter Swaziland, MD   Reason for visit: 1 year follow-up  History of Present Illness:    Marie Anderson is a 77 y.o. female with a hx of SVT, breast cancer status post left mastectomy and reconstruction, myelodysplasia syndrome with thrombocytopenia and neutropenia (aspirin discontinued), Sjogren's syndrome and antiphospholipid syndrome.  SVT required adenosine in 2013.  SVT in 2015 responded to Valsalva maneuver.  SVT in 2018 converted with adenosine.  She last saw Dr. Swaziland in April 2023.  She was feeling well.  She described few episodes of SVT limited by coughing.  She had 1 prolonged episode that resolved by an extra metoprolol tablet.  Today, she feels well.  She has not had prolonged episodes of SVT that she is needed to cough or take any extra medication for the past year that she can remember.  She will sometimes feel brief palpitations in her upper central chest that will go away on its own.  She denies exertional chest pain and shortness of breath.  She denies lightheadedness, syncope, PND, orthopnea and lower extremity edema.    Past Medical History:  Diagnosis Date   Anemia    Antiphospholipid antibody syndrome (HCC)    Breast cancer (HCC) 12/04/11   left- inv ductal ca, DCIS, ER/PR +, HER 2 +   Family history of breast cancer    Family history of ovarian cancer    Family history of pancreatic cancer    History of chemotherapy 12/31/11 -03/11/12   s/p 4 cycles   History of radiation therapy 05/04/12-06/20/12   left breast/   Myelodysplasia    Neuropathy    PONV (postoperative nausea and vomiting)    Skin cancer    Melanoma at 60   SVT (supraventricular tachycardia)     Past Surgical History:  Procedure Laterality Date   AUGMENTATION MAMMAPLASTY Bilateral    2014 with reduction on right   BREAST  RECONSTRUCTION WITH PLACEMENT OF TISSUE EXPANDER AND FLEX HD (ACELLULAR HYDRATED DERMIS)  04/06/2012   Procedure: BREAST RECONSTRUCTION WITH PLACEMENT OF TISSUE EXPANDER AND FLEX HD (ACELLULAR HYDRATED DERMIS);  Surgeon: Wayland Denis, DO;  Location: [facility];  Service: Plastics;  Laterality: Left;  IMMEDIATE LEFT BREAST RECONSTRUCTION WITH PLACEMENT OF TISSUE EXPANDER AND ALLODERM   COLONOSCOPY     DILATION AND CURETTAGE OF UTERUS     EYE SURGERY     age 78-lt eye growth   LIPOSUCTION WITH LIPOFILLING N/A 01/19/2013   Procedure: LIPOSUCTION WITH LIPOFILLING;  Surgeon: Wayland Denis, DO;  Location: [facility];  Service: Plastics;  Laterality: N/A;   MASTECTOMY Left    2014   MASTECTOMY W/ SENTINEL NODE BIOPSY  04/06/2012   Procedure: MASTECTOMY WITH SENTINEL LYMPH NODE BIOPSY;  Surgeon: Emelia Loron, MD;  Location: [facility];  Service: General;  Laterality: Left;  left mastectomy, left axillary sentinel node biopsy   MASTOPEXY Right 01/19/2013   Procedure: RIGHT BREAST PLACEMENT OF IMPLANT WITH MASTOPEXY;  Surgeon: Wayland Denis, DO;  Location: [facility];  Service: Plastics;  Laterality: Right;   PORT-A-CATH REMOVAL Right 01/19/2013   Procedure: REMOVAL PORT-A-CATH;  Surgeon: Emelia Loron, MD;  Location:  SURGERY CENTER;  Service: General;  Laterality: Right;   PORTACATH PLACEMENT  12/23/2011   Procedure: INSERTION PORT-A-CATH;  Surgeon: Emelia Loron,  MD;  Location: [facility];  Service: General;  Laterality: Right;   REMOVAL OF TISSUE EXPANDER AND PLACEMENT OF IMPLANT Left 01/19/2013   Procedure: REMOVAL OF LEFT TISSUE EXPANDERS WITH PLACEMENT OF LEFT BREAST IMPLANT;  Surgeon: Wayland Denis, DO;  Location: [facility];  Service: Plastics;  Laterality: Left;    Current Medications: Current Meds  Medication Sig   Calcium Carb-Cholecalciferol (CALCIUM CARBONATE-VITAMIN D3 PO) Take  by mouth.   Calcium Carbonate-Vit D-Min (CALCIUM 1200 PO) Take 1 tablet by mouth every morning.   cholecalciferol (VITAMIN D) 1000 UNITS tablet Take by mouth daily.    cyanocobalamin 100 MCG tablet Take 100 mcg by mouth daily.   fluocinonide-emollient (LIDEX-E) 0.05 % cream Apply 1 application  topically daily as needed (welps and rash.).   gabapentin (NEURONTIN) 300 MG capsule Take 300 mg by mouth at bedtime.   pilocarpine (PILOCAR) 1 % ophthalmic solution 1 drop into affected eye Ophthalmic Three times a day   pilocarpine (SALAGEN) 5 MG tablet    vitamin E 200 UNIT capsule Take 200 Units by mouth every morning.    zolpidem (AMBIEN CR) 12.5 MG CR tablet Take 1 tablet (12.5 mg total) by mouth at bedtime as needed. for sleep   [DISCONTINUED] hydrochlorothiazide (HYDRODIURIL) 25 MG tablet TAKE 1 TABLET BY MOUTH EVERY DAY FOR 30 DAYS   [DISCONTINUED] metoprolol succinate (TOPROL-XL) 100 MG 24 hr tablet TAKE 1 TABLET BY MOUTH DAILY. TAKE WITH OR IMMEDIATELY FOLLOWING A MEAL.     Allergies:   Cephalexin, Cephalosporins, Codeine, and Tape   Social History   Socioeconomic History   Marital status: Single    Spouse name: Not on file   Number of children: 2   Years of education: Not on file   Highest education level: Not on file  Occupational History   Occupation: Engineer, drilling    Comment: retired   Occupation: front office     Comment: [employer]  Tobacco Use   Smoking status: Never   Smokeless tobacco: Never  Substance and Sexual Activity   Alcohol use: Yes   Drug use: No   Sexual activity: Yes  Other Topics Concern   Not on file  Social History Narrative   Not on file   Social Determinants of Health   Financial Resource Strain: Not on file  Food Insecurity: No Food Insecurity (06/18/2022)   Hunger Vital Sign    Worried About Running Out of Food in the Last Year: Never true    Ran Out of Food in the Last Year: Never true  Transportation Needs: No Transportation  Needs (06/18/2022)   PRAPARE - Administrator, Civil Service (Medical): No    Lack of Transportation (Non-Medical): No  Physical Activity: Not on file  Stress: Not on file  Social Connections: Not on file     Family History: The patient's family history includes Cancer in her cousin, father, and paternal uncle; Heart attack in her maternal grandfather; Kidney cancer in her maternal uncle; Ovarian cancer in her cousin; Pancreatic cancer (age of onset: 25) in her cousin; Pancreatic cancer (age of onset: 108) in her cousin; Stroke in her maternal grandmother and mother.  ROS:   Please see the history of present illness.     EKGs/Labs/Other Studies Reviewed:    EKG:  The ekg ordered today demonstrates normal sinus rhythm with premature atrial complexes, nonspecific ST and T wave abnormality. HR 75.  Recent Labs: 01/26/2022: ALT 11; BUN  13; Creatinine 0.82; Potassium 3.7; Sodium 141 06/03/2022: Hemoglobin 13.7; Platelet Count 40   Recent Lipid Panel No results found for: "CHOL", "TRIG", "HDL", "LDLCALC", "LDLDIRECT"  Physical Exam:    VS:  BP 128/82   Pulse 75   Ht 5\' 5"  (1.651 m)   Wt 184 lb 9.6 oz (83.7 kg)   BMI 30.72 kg/m    No data found.   Wt Readings from Last 3 Encounters:  07/22/22 184 lb 9.6 oz (83.7 kg)  06/03/22 189 lb 4.8 oz (85.9 kg)  01/26/22 191 lb 4.8 oz (86.8 kg)     GEN:  Well nourished, well developed in no acute distress HEENT: Normal NECK: No JVD; No carotid bruits CARDIAC: RRR, no murmurs, rubs, gallops RESPIRATORY:  Clear to auscultation without rales, wheezing or rhonchi  ABDOMEN: Soft, non-tender, non-distended MUSCULOSKELETAL: No edema SKIN: Warm and dry NEUROLOGIC:  Alert and oriented PSYCHIATRIC:  Normal affect    ASSESSMENT AND PLAN   Supraventricular tachycardia, rare symptoms -Well-controlled with Toprol XL 100 mg nightly -refilled today.  Hypertension, well-controlled -Refilled her HCTZ 25 mg daily.   -Goal BP is <130/80.   Recommend DASH diet (high in vegetables, fruits, low-fat dairy products, whole grains, poultry, fish, and nuts and low in sweets, sugar-sweetened beverages, and red meats), salt restriction and increase physical activity.  Disposition - Follow-up in 1 year.   Medication Adjustments/Labs and Tests Ordered: Current medicines are reviewed at length with the patient today.  Concerns regarding medicines are outlined above.  Orders Placed This Encounter  Procedures   EKG 12-Lead   Meds ordered this encounter  Medications   hydrochlorothiazide (HYDRODIURIL) 25 MG tablet    Sig: Take 1 tablet (25 mg total) by mouth daily.    Dispense:  90 tablet    Refill:  3    Order Specific Question:   Supervising Provider    Answer:   [employer]   metoprolol succinate (TOPROL-XL) 100 MG 24 hr tablet    Sig: Take 1 tablet (100 mg total) by mouth daily. TAKE WITH OR IMMEDIATELY FOLLOWING A MEAL.    Dispense:  90 tablet    Refill:  3    Order Specific Question:   Supervising Provider    Answer:   Sande Rives [[id number]]    Patient Instructions  Medication Instructions:  Your physician recommends that you continue on your current medications as directed. Please refer to the Current Medication list given to you today. *If you need a refill on your cardiac medications before your next appointment, please call your pharmacy*  Follow-Up: At [facility], you and your health needs are our priority.  As part of our continuing mission to provide you with exceptional heart care, we have created designated Provider Care Teams.  These Care Teams include your primary Cardiologist (physician) and Advanced Practice Providers (APPs -  Physician Assistants and Nurse Practitioners) who all work together to provide you with the care you need, when you need it.  We recommend signing up for the patient portal called "MyChart".  Sign up information is provided on this After Visit Summary.   MyChart is used to connect with patients for Virtual Visits (Telemedicine).  Patients are able to view lab/test results, encounter notes, upcoming appointments, etc.  Non-urgent messages can be sent to your provider as well.   To learn more about what you can do with MyChart, go to [email or web address].    Your next appointment:   1 year(s)  Provider:   Juanda Crumble PA-C or Dr. Peter Swaziland   Signed, Cannon Kettle, PA-C  07/22/2022 12:13 PM    [facility]

## 2022-07-22 ENCOUNTER — Encounter: Payer: Self-pay | Admitting: Physician Assistant

## 2022-07-22 ENCOUNTER — Ambulatory Visit: Payer: Medicare Other | Attending: Physician Assistant | Admitting: Physician Assistant

## 2022-07-22 VITALS — BP 128/82 | HR 75 | Ht 65.0 in | Wt 184.6 lb

## 2022-07-22 DIAGNOSIS — I10 Essential (primary) hypertension: Secondary | ICD-10-CM | POA: Diagnosis not present

## 2022-07-22 DIAGNOSIS — I471 Supraventricular tachycardia, unspecified: Secondary | ICD-10-CM | POA: Insufficient documentation

## 2022-07-22 MED ORDER — METOPROLOL SUCCINATE ER 100 MG PO TB24: 100.0000 mg | ORAL_TABLET | Freq: Every day | ORAL | 3 refills | Status: DC

## 2022-07-22 MED ORDER — HYDROCHLOROTHIAZIDE 25 MG PO TABS: 25.0000 mg | ORAL_TABLET | Freq: Every day | ORAL | 3 refills | Status: DC

## 2022-07-22 NOTE — Patient Instructions (Signed)
Medication Instructions:  Your physician recommends that you continue on your current medications as directed. Please refer to the Current Medication list given to you today. *If you need a refill on your cardiac medications before your next appointment, please call your pharmacy*  Follow-Up: At [facility], you and your health needs are our priority.  As part of our continuing mission to provide you with exceptional heart care, we have created designated Provider Care Teams.  These Care Teams include your primary Cardiologist (physician) and Advanced Practice Providers (APPs -  Physician Assistants and Nurse Practitioners) who all work together to provide you with the care you need, when you need it.  We recommend signing up for the patient portal called "MyChart".  Sign up information is provided on this After Visit Summary.  MyChart is used to connect with patients for Virtual Visits (Telemedicine).  Patients are able to view lab/test results, encounter notes, upcoming appointments, etc.  Non-urgent messages can be sent to your provider as well.   To learn more about what you can do with MyChart, go to [email or web address].    Your next appointment:   1 year(s)  Provider:   Juanda Crumble PA-C or Dr. Peter Swaziland

## 2022-08-11 DIAGNOSIS — L02212 Cutaneous abscess of back [any part, except buttock]: Secondary | ICD-10-CM | POA: Diagnosis not present

## 2022-08-11 DIAGNOSIS — Z6831 Body mass index (BMI) 31.0-31.9, adult: Secondary | ICD-10-CM | POA: Diagnosis not present

## 2022-09-14 ENCOUNTER — Other Ambulatory Visit: Payer: Self-pay | Admitting: Hematology and Oncology

## 2022-09-14 DIAGNOSIS — D46Z Other myelodysplastic syndromes: Secondary | ICD-10-CM

## 2022-09-14 DIAGNOSIS — F5101 Primary insomnia: Secondary | ICD-10-CM

## 2022-09-14 MED ORDER — ZOLPIDEM TARTRATE ER 12.5 MG PO TBCR: 12.5000 mg | EXTENDED_RELEASE_TABLET | Freq: Every evening | ORAL | 3 refills | Status: DC | PRN

## 2022-10-07 ENCOUNTER — Inpatient Hospital Stay: Payer: Medicare Other

## 2022-10-07 ENCOUNTER — Telehealth: Payer: Self-pay

## 2022-10-07 ENCOUNTER — Inpatient Hospital Stay: Payer: Medicare Other | Admitting: Hematology and Oncology

## 2022-10-07 NOTE — Telephone Encounter (Signed)
Pt called and states she tested positive for COVID 10/06/22. She explains she is symptomatic and asks if Dr Pamelia Hoit will prescribe Paxlovid as she has autoimmune disorders. At this time, Dr Pamelia Hoit is only prescribing Paxlovid to pts who are immunocompromiserd d/t chemo or imminotherapy. Advised pt to reach out to PCP or rheumatologist if she has one for her autoimmune disorder She verbalized thanks and understanding.

## 2022-10-14 DIAGNOSIS — D225 Melanocytic nevi of trunk: Secondary | ICD-10-CM | POA: Diagnosis not present

## 2022-10-14 DIAGNOSIS — D485 Neoplasm of uncertain behavior of skin: Secondary | ICD-10-CM | POA: Diagnosis not present

## 2022-10-14 DIAGNOSIS — J329 Chronic sinusitis, unspecified: Secondary | ICD-10-CM | POA: Diagnosis not present

## 2022-10-14 DIAGNOSIS — Z86018 Personal history of other benign neoplasm: Secondary | ICD-10-CM | POA: Diagnosis not present

## 2022-10-14 DIAGNOSIS — D2272 Melanocytic nevi of left lower limb, including hip: Secondary | ICD-10-CM | POA: Diagnosis not present

## 2022-10-14 DIAGNOSIS — D2230 Melanocytic nevi of unspecified part of face: Secondary | ICD-10-CM | POA: Diagnosis not present

## 2022-10-14 DIAGNOSIS — D2271 Melanocytic nevi of right lower limb, including hip: Secondary | ICD-10-CM | POA: Diagnosis not present

## 2022-10-14 DIAGNOSIS — L570 Actinic keratosis: Secondary | ICD-10-CM | POA: Diagnosis not present

## 2022-10-14 DIAGNOSIS — Z85828 Personal history of other malignant neoplasm of skin: Secondary | ICD-10-CM | POA: Diagnosis not present

## 2022-10-14 DIAGNOSIS — L84 Corns and callosities: Secondary | ICD-10-CM | POA: Diagnosis not present

## 2022-10-14 DIAGNOSIS — L821 Other seborrheic keratosis: Secondary | ICD-10-CM | POA: Diagnosis not present

## 2022-10-14 DIAGNOSIS — L578 Other skin changes due to chronic exposure to nonionizing radiation: Secondary | ICD-10-CM | POA: Diagnosis not present

## 2022-10-14 DIAGNOSIS — H6993 Unspecified Eustachian tube disorder, bilateral: Secondary | ICD-10-CM | POA: Diagnosis not present

## 2022-10-28 ENCOUNTER — Inpatient Hospital Stay (HOSPITAL_BASED_OUTPATIENT_CLINIC_OR_DEPARTMENT_OTHER): Payer: Medicare Other | Admitting: Hematology and Oncology

## 2022-10-28 ENCOUNTER — Inpatient Hospital Stay: Payer: Medicare Other | Attending: Hematology and Oncology

## 2022-10-28 VITALS — BP 123/73 | HR 83 | Temp 97.3°F | Resp 18 | Ht 65.0 in | Wt 184.0 lb

## 2022-10-28 DIAGNOSIS — Z923 Personal history of irradiation: Secondary | ICD-10-CM | POA: Diagnosis not present

## 2022-10-28 DIAGNOSIS — Z170 Estrogen receptor positive status [ER+]: Secondary | ICD-10-CM | POA: Diagnosis not present

## 2022-10-28 DIAGNOSIS — Z79811 Long term (current) use of aromatase inhibitors: Secondary | ICD-10-CM | POA: Diagnosis not present

## 2022-10-28 DIAGNOSIS — D46Z Other myelodysplastic syndromes: Secondary | ICD-10-CM

## 2022-10-28 DIAGNOSIS — Z9012 Acquired absence of left breast and nipple: Secondary | ICD-10-CM | POA: Insufficient documentation

## 2022-10-28 DIAGNOSIS — C50212 Malignant neoplasm of upper-inner quadrant of left female breast: Secondary | ICD-10-CM | POA: Insufficient documentation

## 2022-10-28 DIAGNOSIS — D469 Myelodysplastic syndrome, unspecified: Secondary | ICD-10-CM | POA: Diagnosis not present

## 2022-10-28 LAB — CBC WITH DIFFERENTIAL (CANCER CENTER ONLY)
Abs Immature Granulocytes: 0.02 10*3/uL (ref 0.00–0.07)
Basophils Absolute: 0 10*3/uL (ref 0.0–0.1)
Basophils Relative: 1 %
Eosinophils Absolute: 0 10*3/uL (ref 0.0–0.5)
Eosinophils Relative: 0 %
HCT: 38.4 % (ref 36.0–46.0)
Hemoglobin: 13.1 g/dL (ref 12.0–15.0)
Immature Granulocytes: 1 %
Lymphocytes Relative: 36 %
Lymphs Abs: 1 10*3/uL (ref 0.7–4.0)
MCH: 33.1 pg (ref 26.0–34.0)
MCHC: 34.1 g/dL (ref 30.0–36.0)
MCV: 97 fL (ref 80.0–100.0)
Monocytes Absolute: 0.2 10*3/uL (ref 0.1–1.0)
Monocytes Relative: 7 %
Neutro Abs: 1.6 10*3/uL — ABNORMAL LOW (ref 1.7–7.7)
Neutrophils Relative %: 55 %
Platelet Count: 40 10*3/uL — ABNORMAL LOW (ref 150–400)
RBC: 3.96 MIL/uL (ref 3.87–5.11)
RDW: 14.4 % (ref 11.5–15.5)
WBC Count: 2.9 10*3/uL — ABNORMAL LOW (ref 4.0–10.5)
nRBC: 0 % (ref 0.0–0.2)

## 2022-10-28 MED ORDER — METHYLPREDNISOLONE 4 MG PO TBPK: ORAL_TABLET | ORAL | 0 refills | Status: DC

## 2022-10-28 NOTE — Progress Notes (Signed)
Patient Care Team: Daisy Floro, MD as PCP - General (Family Medicine) Swaziland, Peter M, MD as PCP - Cardiology (Cardiology) Pollyann Savoy, MD as Consulting Physician (Rheumatology)  DIAGNOSIS:  Encounter Diagnoses  Name Primary?   Malignant neoplasm of upper-inner quadrant of left breast in female, estrogen receptor positive (HCC) Yes   MDS (myelodysplastic syndrome), low grade (HCC)     SUMMARY OF ONCOLOGIC HISTORY: Oncology History  Breast cancer of upper-inner quadrant of left female breast (HCC)  12/31/2011 - 03/11/2012 Neo-Adjuvant Chemotherapy   Taxotere Herceptin Perjeta 4 followed by Herceptin maintenance completed 01/29/2013   04/06/2012 Surgery   Left breast mastectomy: IDC grade 1; 0.8 cm, low-grade DCIS, LV I identified, 1/2 lymph node Micro met, ER/PR positive HER-2 positive Ki-67 94%   05/04/2012 - 06/20/2012 Radiation Therapy   Adjuvant radiation therapy   07/29/2012 -  Anti-estrogen oral therapy   Arimidex 1 mg daily   10/17/2014 Procedure   Bone marrow biopsy for thrombocytopenia and leukopenia: Hypercellular marrow with trilineage dysplasia most apparent in megakaryocytes with associated myelofibrosis: low-grade MDS with refractory cytopenias and multilineage dysplasia (20q- partial del)   MDS (myelodysplastic syndrome), low grade (HCC)  10/17/2014 Initial Diagnosis   MDS (myelodysplastic syndrome), low grade , refractory cytopenia with multi nature dysplasia cytogenetics -20q (associated with good prognosis MDS)     CHIEF COMPLIANT: MDS (myelodysplastic syndrome)   INTERVAL HISTORY: Marie Anderson is a 77 y.o. with above-mentioned history of left breast cancer and MDS who underwent neoadjuvant chemotherapy, left mastectomy, radiation, and is currently on anastrozole. She presents to the clinic for a follow-up. 8/3 she had a severe case of Covid. She says she is still Nasal and she still have thick phlegm. Sometimes it is deeper red or yellowish. Glands  are still swollen. She complains of hearing issues.    ALLERGIES:  is allergic to cephalexin, cephalosporins, codeine, and tape.  MEDICATIONS:  Current Outpatient Medications  Medication Sig Dispense Refill   Calcium Carb-Cholecalciferol (CALCIUM CARBONATE-VITAMIN D3 PO) Take by mouth.     Calcium Carbonate-Vit D-Min (CALCIUM 1200 PO) Take 1 tablet by mouth every morning.     cholecalciferol (VITAMIN D) 1000 UNITS tablet Take by mouth daily.      cyanocobalamin 100 MCG tablet Take 100 mcg by mouth daily.     fluocinonide-emollient (LIDEX-E) 0.05 % cream Apply 1 application  topically daily as needed (welps and rash.).     gabapentin (NEURONTIN) 300 MG capsule Take 300 mg by mouth at bedtime.     hydrochlorothiazide (HYDRODIURIL) 25 MG tablet Take 1 tablet (25 mg total) by mouth daily. 90 tablet 3   methylPREDNISolone (MEDROL DOSEPAK) 4 MG TBPK tablet Use as directed 21 tablet 0   metoprolol succinate (TOPROL-XL) 100 MG 24 hr tablet Take 1 tablet (100 mg total) by mouth daily. TAKE WITH OR IMMEDIATELY FOLLOWING A MEAL. 90 tablet 3   pilocarpine (PILOCAR) 1 % ophthalmic solution 1 drop into affected eye Ophthalmic Three times a day     pilocarpine (SALAGEN) 5 MG tablet      vitamin E 200 UNIT capsule Take 200 Units by mouth every morning.      zolpidem (AMBIEN CR) 12.5 MG CR tablet Take 1 tablet (12.5 mg total) by mouth at bedtime as needed. for sleep 30 tablet 3   No current facility-administered medications for this visit.    PHYSICAL EXAMINATION: ECOG PERFORMANCE STATUS: 1 - Symptomatic but completely ambulatory  Vitals:   10/28/22 1032  BP: 123/73  Pulse: 83  Resp: 18  Temp: (!) 97.3 F (36.3 C)  SpO2: 97%   Filed Weights   10/28/22 1032  Weight: 184 lb (83.5 kg)      LABORATORY DATA:  I have reviewed the data as listed    Latest Ref Rng & Units 01/26/2022    9:17 AM 10/22/2021    9:54 AM 09/18/2020    8:20 AM  CMP  Glucose 70 - 99 mg/dL 93  89  409   BUN 8 - 23  mg/dL 13  14  11    Creatinine 0.44 - 1.00 mg/dL 8.11  9.14  7.82   Sodium 135 - 145 mmol/L 141  141  142   Potassium 3.5 - 5.1 mmol/L 3.7  3.7  4.3   Chloride 98 - 111 mmol/L 105  108  106   CO2 22 - 32 mmol/L 31  30  30    Calcium 8.9 - 10.3 mg/dL 9.1  8.6  8.8   Total Protein 6.5 - 8.1 g/dL 7.1  6.7  7.2   Total Bilirubin 0.3 - 1.2 mg/dL 0.8  0.7  0.8   Alkaline Phos 38 - 126 U/L 56  64  70   AST 15 - 41 U/L 18  18  23    ALT 0 - 44 U/L 11  11  16      Lab Results  Component Value Date   WBC 2.9 (L) 10/28/2022   HGB 13.1 10/28/2022   HCT 38.4 10/28/2022   MCV 97.0 10/28/2022   PLT 40 (L) 10/28/2022   NEUTROABS 1.6 (L) 10/28/2022    ASSESSMENT & PLAN:  Breast cancer of upper-inner quadrant of left female breast (HCC) Left breast invasive ductal carcinoma ER/PR positive HER-2 negative Ki-67 94% diagnosed October 2013 status post 4 cycles of TCH Perjeta followed by left mastectomy which revealed a 0.8 cm grade 1 IDC, 1 sentinel node micrometastatic disease ER/PR 100% positive status post radiation therapy and completed adjuvant trastuzumab 01/12/2013 currently on Arimidex 1 mg daily from 07/29/2012 completed May 2021   Breast cancer surveillance:  Right breast mammograms: [facility].     Basal cell carcinoma on the face: Removed 09/18/2020   MDS (myelodysplastic syndrome), low grade Labs reviewed 06/26/2020: Platelet count 44 and white count 3, ANC 1.8 . 09/18/2020: Platelets 44 WBC 3 10/22/2021: WBC 3.3, hemoglobin 13.1, platelets 34 01/26/22: WBC 3.6, hemoglobin 13, platelets 34 06/03/2022: WBC 2.8, hemoglobin 13.7, platelets 40, ANC 1.5 10/28/2022: WBC 2.9, hemoglobin 13.1, platelets 40, ANC 1.6     She would like to see Dr. Park Breed for consultation.    We will continue to watch and monitor Recheck labs in 6 months and follow-up    Orders Placed This Encounter  Procedures   CBC with Differential (Cancer Center Only)    Standing Status:   Future    Standing  Expiration Date:   10/28/2023   The patient has a good understanding of the overall plan. she agrees with it. she will call with any problems that may develop before the next visit here. Total time spent: 30 mins including face to face time and time spent for planning, charting and co-ordination of care   Tamsen Meek, MD 10/28/22    I Janan Ridge am acting as a Neurosurgeon for [employer]  I have reviewed the above documentation for accuracy and completeness, and I agree with the above.

## 2022-10-28 NOTE — Assessment & Plan Note (Signed)
Left breast invasive ductal carcinoma ER/PR positive HER-2 negative Ki-67 94% diagnosed October 2013 status post 4 cycles of TCH Perjeta followed by left mastectomy which revealed a 0.8 cm grade 1 IDC, 1 sentinel node micrometastatic disease ER/PR 100% positive status post radiation therapy and completed adjuvant trastuzumab 01/12/2013 currently on Arimidex 1 mg daily from 07/29/2012 completed May 2021   Breast cancer surveillance:  Right breast mammograms: [facility].     Basal cell carcinoma on the face: Removed 09/18/2020   MDS (myelodysplastic syndrome), low grade Labs reviewed 06/26/2020: Platelet count 44 and white count 3, ANC 1.8 . 09/18/2020: Platelets 44 WBC 3 10/22/2021: WBC 3.3, hemoglobin 13.1, platelets 34 01/26/22: WBC 3.6, hemoglobin 13, platelets 34 06/03/2022: WBC 2.8, hemoglobin 13.7, platelets 40, ANC 1.5   Patient is going to [place] next month and is very excited about it. She would like to see Dr. Park Breed for consultation.    We will continue to watch and monitor Recheck labs in 4 months and follow-up

## 2022-11-11 DIAGNOSIS — H2513 Age-related nuclear cataract, bilateral: Secondary | ICD-10-CM | POA: Diagnosis not present

## 2022-11-11 DIAGNOSIS — H35371 Puckering of macula, right eye: Secondary | ICD-10-CM | POA: Diagnosis not present

## 2022-11-11 DIAGNOSIS — H3561 Retinal hemorrhage, right eye: Secondary | ICD-10-CM | POA: Diagnosis not present

## 2022-11-25 DIAGNOSIS — Z124 Encounter for screening for malignant neoplasm of cervix: Secondary | ICD-10-CM | POA: Diagnosis not present

## 2022-11-25 DIAGNOSIS — Z779 Other contact with and (suspected) exposures hazardous to health: Secondary | ICD-10-CM | POA: Diagnosis not present

## 2022-11-25 DIAGNOSIS — Z1231 Encounter for screening mammogram for malignant neoplasm of breast: Secondary | ICD-10-CM | POA: Diagnosis not present

## 2022-11-25 DIAGNOSIS — Z6831 Body mass index (BMI) 31.0-31.9, adult: Secondary | ICD-10-CM | POA: Diagnosis not present

## 2022-12-16 DIAGNOSIS — N871 Moderate cervical dysplasia: Secondary | ICD-10-CM | POA: Diagnosis not present

## 2022-12-16 DIAGNOSIS — N870 Mild cervical dysplasia: Secondary | ICD-10-CM | POA: Diagnosis not present

## 2022-12-30 ENCOUNTER — Ambulatory Visit (INDEPENDENT_AMBULATORY_CARE_PROVIDER_SITE_OTHER): Payer: Medicare Other | Admitting: Podiatry

## 2022-12-30 ENCOUNTER — Encounter: Payer: Self-pay | Admitting: Podiatry

## 2022-12-30 DIAGNOSIS — B351 Tinea unguium: Secondary | ICD-10-CM

## 2022-12-30 DIAGNOSIS — M79674 Pain in right toe(s): Secondary | ICD-10-CM

## 2022-12-30 DIAGNOSIS — T451X5A Adverse effect of antineoplastic and immunosuppressive drugs, initial encounter: Secondary | ICD-10-CM

## 2022-12-30 DIAGNOSIS — G620 Drug-induced polyneuropathy: Secondary | ICD-10-CM

## 2022-12-30 DIAGNOSIS — Z853 Personal history of malignant neoplasm of breast: Secondary | ICD-10-CM | POA: Insufficient documentation

## 2022-12-30 DIAGNOSIS — M79675 Pain in left toe(s): Secondary | ICD-10-CM

## 2022-12-30 DIAGNOSIS — D6861 Antiphospholipid syndrome: Secondary | ICD-10-CM | POA: Insufficient documentation

## 2022-12-30 DIAGNOSIS — Q828 Other specified congenital malformations of skin: Secondary | ICD-10-CM | POA: Diagnosis not present

## 2022-12-30 DIAGNOSIS — N61 Mastitis without abscess: Secondary | ICD-10-CM | POA: Insufficient documentation

## 2022-12-30 DIAGNOSIS — N393 Stress incontinence (female) (male): Secondary | ICD-10-CM | POA: Insufficient documentation

## 2022-12-30 DIAGNOSIS — N644 Mastodynia: Secondary | ICD-10-CM | POA: Insufficient documentation

## 2022-12-30 DIAGNOSIS — N3281 Overactive bladder: Secondary | ICD-10-CM | POA: Insufficient documentation

## 2022-12-30 NOTE — Progress Notes (Signed)
Subjective: Marie Anderson presents today  for foot evaluation. Patient has h/o breast cancer, s/p chemotherapy. She relates neuropathic pain after chemotherapy.  Patient is requesting compounded pain cream for her neuropathy. She has brought in an old prescription of compounded cream from [facility], and would like to have a new prescription. Chief Complaint  Patient presents with   Callouses    RFC, PT needs consult on neuropathy and cream from home              PCP is Daisy Floro, MD , and last visit was December 02, 2022.  Past Medical History:  Diagnosis Date   Anemia    Antiphospholipid antibody syndrome (HCC)    Breast cancer (HCC) 12/04/11   left- inv ductal ca, DCIS, ER/PR +, HER 2 +   Family history of breast cancer    Family history of ovarian cancer    Family history of pancreatic cancer    History of chemotherapy 12/31/11 -03/11/12   s/p 4 cycles   History of radiation therapy 05/04/12-06/20/12   left breast/   Myelodysplasia    Neuropathy    PONV (postoperative nausea and vomiting)    Skin cancer    Melanoma at 60   SVT (supraventricular tachycardia) [facility])     Patient Active Problem List   Diagnosis Date Noted   Inflammatory disorder of breast 12/30/2022   Antiphospholipid syndrome (HCC) 12/30/2022   History of malignant neoplasm of breast 12/30/2022   Overactive bladder 12/30/2022   Pain of breast 12/30/2022   Stress incontinence of urine 12/30/2022   Melanoma of skin (HCC) 04/03/2019   Family history of pancreatic cancer    Family history of ovarian cancer    Family history of breast cancer    Insomnia 05/08/2015   MDS (myelodysplastic syndrome), low grade (HCC) 11/07/2014   Status post left breast reconstruction 01/24/2013   History of chemotherapy    Anemia    Acquired absence of left breast and nipple 04/12/2012   Breast cancer of upper-inner quadrant of left female breast (HCC) 12/10/2011   SVT (supraventricular tachycardia)  (HCC) 02/04/2011    Past Surgical History:  Procedure Laterality Date   AUGMENTATION MAMMAPLASTY Bilateral    2014 with reduction on right   BREAST RECONSTRUCTION WITH PLACEMENT OF TISSUE EXPANDER AND FLEX HD (ACELLULAR HYDRATED DERMIS)  04/06/2012   Procedure: BREAST RECONSTRUCTION WITH PLACEMENT OF TISSUE EXPANDER AND FLEX HD (ACELLULAR HYDRATED DERMIS);  Surgeon: Wayland Denis, DO;  Location: [facility];  Service: Plastics;  Laterality: Left;  IMMEDIATE LEFT BREAST RECONSTRUCTION WITH PLACEMENT OF TISSUE EXPANDER AND ALLODERM   COLONOSCOPY     DILATION AND CURETTAGE OF UTERUS     EYE SURGERY     age 37-lt eye growth   LIPOSUCTION WITH LIPOFILLING N/A 01/19/2013   Procedure: LIPOSUCTION WITH LIPOFILLING;  Surgeon: Wayland Denis, DO;  Location: [facility];  Service: Plastics;  Laterality: N/A;   MASTECTOMY Left    2014   MASTECTOMY W/ SENTINEL NODE BIOPSY  04/06/2012   Procedure: MASTECTOMY WITH SENTINEL LYMPH NODE BIOPSY;  Surgeon: Emelia Loron, MD;  Location: [facility];  Service: General;  Laterality: Left;  left mastectomy, left axillary sentinel node biopsy   MASTOPEXY Right 01/19/2013   Procedure: RIGHT BREAST PLACEMENT OF IMPLANT WITH MASTOPEXY;  Surgeon: Wayland Denis, DO;  Location: [facility];  Service: Plastics;  Laterality: Right;   PORT-A-CATH REMOVAL Right 01/19/2013   Procedure: REMOVAL PORT-A-CATH;  Surgeon: Emelia Loron, MD;  Location: [facility];  Service: General;  Laterality: Right;   PORTACATH PLACEMENT  12/23/2011   Procedure: INSERTION PORT-A-CATH;  Surgeon: Emelia Loron, MD;  Location: [facility];  Service: General;  Laterality: Right;   REMOVAL OF TISSUE EXPANDER AND PLACEMENT OF IMPLANT Left 01/19/2013   Procedure: REMOVAL OF LEFT TISSUE EXPANDERS WITH PLACEMENT OF LEFT BREAST IMPLANT;  Surgeon: Wayland Denis, DO;  Location: [facility];  Service:  Plastics;  Laterality: Left;    Current Outpatient Medications on File Prior to Visit  Medication Sig Dispense Refill   Calcium Carb-Cholecalciferol (CALCIUM CARBONATE-VITAMIN D3 PO) Take by mouth.     Calcium Carbonate-Vit D-Min (CALCIUM 1200 PO) Take 1 tablet by mouth every morning.     cholecalciferol (VITAMIN D) 1000 UNITS tablet Take by mouth daily.      cyanocobalamin 100 MCG tablet Take 100 mcg by mouth daily.     fluocinonide-emollient (LIDEX-E) 0.05 % cream Apply 1 application  topically daily as needed (welps and rash.).     gabapentin (NEURONTIN) 300 MG capsule Take 300 mg by mouth at bedtime.     hydrochlorothiazide (HYDRODIURIL) 25 MG tablet Take 1 tablet (25 mg total) by mouth daily. 90 tablet 3   methylPREDNISolone (MEDROL DOSEPAK) 4 MG TBPK tablet Use as directed 21 tablet 0   metoprolol succinate (TOPROL-XL) 100 MG 24 hr tablet Take 1 tablet (100 mg total) by mouth daily. TAKE WITH OR IMMEDIATELY FOLLOWING A MEAL. 90 tablet 3   pilocarpine (PILOCAR) 1 % ophthalmic solution 1 drop into affected eye Ophthalmic Three times a day     pilocarpine (SALAGEN) 5 MG tablet      vitamin E 200 UNIT capsule Take 200 Units by mouth every morning.      zolpidem (AMBIEN CR) 12.5 MG CR tablet Take 1 tablet (12.5 mg total) by mouth at bedtime as needed. for sleep 30 tablet 3   No current facility-administered medications on file prior to visit.     Allergies  Allergen Reactions   Cephalexin    Cephalosporins     headache   Codeine Nausea Only   Tape Other (See Comments)    Sensitivity to bandaids and tape that cause blisters and redness    Social History   Occupational History   Occupation: Engineer, drilling    Comment: retired   Occupation: front office     Comment: [employer]  Tobacco Use   Smoking status: Never   Smokeless tobacco: Never  Substance and Sexual Activity   Alcohol use: Yes   Drug use: No   Sexual activity: Yes    Family History  Problem  Relation Age of Onset   Stroke Mother    Cancer Father        prostate   Kidney cancer Maternal Uncle    Cancer Paternal Uncle        ? bone cancer   Stroke Maternal Grandmother    Heart attack Maternal Grandfather    Pancreatic cancer Cousin 13       maternal first cousin - mother is identical twin to pateint's mother   Cancer Cousin        unknown cancer in maternal first cousin   Ovarian cancer Cousin    Pancreatic cancer Cousin 66    Immunization History  Administered Date(s) Administered   PFIZER(Purple Top)SARS-COV-2 Vaccination 03/30/2019, 04/25/2019    Objective: There were no vitals filed for this visit.  Marie Anderson is a pleasant 77 y.o. female WD, WN in NAD. AAO X 3. Vascular Examination: Capillary refill time immediate b/l. Vascular status intact b/l with palpable pedal pulses. Pedal hair present b/l. No pain with calf compression b/l. Skin temperature gradient WNL b/l. No cyanosis or clubbing b/l. No ischemia or gangrene noted b/l.   Neurological Examination: Sensation grossly intact b/l with 10 gram monofilament. Vibratory sensation intact b/l. Pt has subjective symptoms of neuropathy. Protective sensation diminished with 10g monofilament b/l.  Dermatological Examination: Pedal skin with normal turgor, texture and tone b/l.  No open wounds. No interdigital macerations.   Toenails bilateral great toes, L 5th toe, R 2nd toe, R 3rd toe, and R 5th toe elongated, discolored, dystrophic, thickened, and crumbly with subungual debris and tenderness to dorsal palpation. Porokeratotic lesion(s) of both feet. No erythema, no edema, no drainage, no fluctuance.  Musculoskeletal Examination: Muscle strength 5/5 to all lower extremity muscle groups bilaterally. No pain, crepitus or joint limitation noted with ROM bilateral LE. No gross bony deformities bilaterally.  Radiographs: None  Assessment: 1. Pain due to onychomycosis of toenails of both feet   2. Porokeratosis    3. Chemotherapy-induced peripheral neuropathy (HCC)    Plan: Meds ordered this encounter  Medications   NONFORMULARY OR COMPOUNDED ITEM from [facility]    Sig: Apply 1-2 gm to both feet 3-4x daily prn.    Dispense:  60 each    Refill:  PRN    Compounded pain cream. Dispense 60 gram bottle: Ketamine 5%, Baclofen 2%, Gabapentin 5%, Lidocaine 5%, Menthol 1%    -Patient was evaluated today. All questions/concerns addressed on today's visit. -Discussed neuropathy and treatment options of compounded pain cream. Discussed compounded pain cream available from [place]. Rx for compounded pain cream to be sent to [facility] for Ketamine 5%, Baclofen 2%, Gabapentin 5%, Lidocaine 3%, Menthol 1% to be applied 1-2 pumps (gm) 3-4 x daily prn. Refill prn.. -Patient to continue soft, supportive shoe gear daily. -Toenails were debrided in length and girth bilateral great toes, bilateral 5th toes, R 2nd toe, and R 5th toe with sterile nail nippers and dremel without iatrogenic bleeding.  -Porokeratotic lesion(s) b/l feet pared and enucleated with sterile currette without incident. -Patient/POA to call should there be question/concern in the interim.  Return in about 3 months (around 04/01/2023).  Freddie Breech, DPM

## 2022-12-31 ENCOUNTER — Encounter: Payer: Self-pay | Admitting: Podiatry

## 2023-01-02 MED ORDER — NONFORMULARY OR COMPOUNDED ITEM
99 refills | Status: DC
Start: 2022-12-30 — End: 2023-02-21

## 2023-01-20 ENCOUNTER — Other Ambulatory Visit: Payer: Self-pay | Admitting: Hematology and Oncology

## 2023-01-20 DIAGNOSIS — D46Z Other myelodysplastic syndromes: Secondary | ICD-10-CM

## 2023-01-20 DIAGNOSIS — F5101 Primary insomnia: Secondary | ICD-10-CM

## 2023-01-20 MED ORDER — ZOLPIDEM TARTRATE ER 12.5 MG PO TBCR
12.5000 mg | EXTENDED_RELEASE_TABLET | Freq: Every evening | ORAL | 3 refills | Status: DC | PRN
Start: 2023-01-20 — End: 2023-05-11

## 2023-02-03 ENCOUNTER — Inpatient Hospital Stay: Payer: Medicare Other | Admitting: Hematology and Oncology

## 2023-02-03 ENCOUNTER — Inpatient Hospital Stay: Payer: Medicare Other

## 2023-02-21 ENCOUNTER — Emergency Department (HOSPITAL_BASED_OUTPATIENT_CLINIC_OR_DEPARTMENT_OTHER): Payer: Medicare Other

## 2023-02-21 ENCOUNTER — Encounter (HOSPITAL_COMMUNITY): Payer: Self-pay | Admitting: Internal Medicine

## 2023-02-21 ENCOUNTER — Other Ambulatory Visit: Payer: Self-pay

## 2023-02-21 ENCOUNTER — Inpatient Hospital Stay (HOSPITAL_BASED_OUTPATIENT_CLINIC_OR_DEPARTMENT_OTHER)
Admission: EM | Admit: 2023-02-21 | Discharge: 2023-02-23 | DRG: 194 | Disposition: A | Discharge status: Home / Self Care | Payer: Medicare Other | Attending: Internal Medicine | Admitting: Internal Medicine

## 2023-02-21 DIAGNOSIS — Z8051 Family history of malignant neoplasm of kidney: Secondary | ICD-10-CM

## 2023-02-21 DIAGNOSIS — Z881 Allergy status to other antibiotic agents status: Secondary | ICD-10-CM | POA: Diagnosis not present

## 2023-02-21 DIAGNOSIS — Z823 Family history of stroke: Secondary | ICD-10-CM

## 2023-02-21 DIAGNOSIS — Z9882 Breast implant status: Secondary | ICD-10-CM

## 2023-02-21 DIAGNOSIS — D849 Immunodeficiency, unspecified: Secondary | ICD-10-CM | POA: Diagnosis present

## 2023-02-21 DIAGNOSIS — D469 Myelodysplastic syndrome, unspecified: Secondary | ICD-10-CM | POA: Diagnosis present

## 2023-02-21 DIAGNOSIS — Z803 Family history of malignant neoplasm of breast: Secondary | ICD-10-CM

## 2023-02-21 DIAGNOSIS — M799 Soft tissue disorder, unspecified: Secondary | ICD-10-CM | POA: Diagnosis not present

## 2023-02-21 DIAGNOSIS — Z8249 Family history of ischemic heart disease and other diseases of the circulatory system: Secondary | ICD-10-CM | POA: Diagnosis not present

## 2023-02-21 DIAGNOSIS — J189 Pneumonia, unspecified organism: Principal | ICD-10-CM

## 2023-02-21 DIAGNOSIS — A419 Sepsis, unspecified organism: Secondary | ICD-10-CM

## 2023-02-21 DIAGNOSIS — D696 Thrombocytopenia, unspecified: Secondary | ICD-10-CM | POA: Diagnosis not present

## 2023-02-21 DIAGNOSIS — R000 Tachycardia, unspecified: Secondary | ICD-10-CM | POA: Diagnosis not present

## 2023-02-21 DIAGNOSIS — Z85820 Personal history of malignant melanoma of skin: Secondary | ICD-10-CM | POA: Diagnosis not present

## 2023-02-21 DIAGNOSIS — R918 Other nonspecific abnormal finding of lung field: Secondary | ICD-10-CM | POA: Diagnosis not present

## 2023-02-21 DIAGNOSIS — Z853 Personal history of malignant neoplasm of breast: Secondary | ICD-10-CM | POA: Diagnosis not present

## 2023-02-21 DIAGNOSIS — J180 Bronchopneumonia, unspecified organism: Principal | ICD-10-CM | POA: Diagnosis present

## 2023-02-21 DIAGNOSIS — E860 Dehydration: Secondary | ICD-10-CM | POA: Diagnosis not present

## 2023-02-21 DIAGNOSIS — Z8041 Family history of malignant neoplasm of ovary: Secondary | ICD-10-CM

## 2023-02-21 DIAGNOSIS — Z79899 Other long term (current) drug therapy: Secondary | ICD-10-CM

## 2023-02-21 DIAGNOSIS — N179 Acute kidney failure, unspecified: Secondary | ICD-10-CM

## 2023-02-21 DIAGNOSIS — D709 Neutropenia, unspecified: Secondary | ICD-10-CM | POA: Diagnosis present

## 2023-02-21 DIAGNOSIS — D6861 Antiphospholipid syndrome: Secondary | ICD-10-CM | POA: Diagnosis not present

## 2023-02-21 DIAGNOSIS — Z885 Allergy status to narcotic agent status: Secondary | ICD-10-CM | POA: Diagnosis not present

## 2023-02-21 DIAGNOSIS — I10 Essential (primary) hypertension: Secondary | ICD-10-CM | POA: Diagnosis present

## 2023-02-21 DIAGNOSIS — Z9221 Personal history of antineoplastic chemotherapy: Secondary | ICD-10-CM

## 2023-02-21 DIAGNOSIS — R531 Weakness: Secondary | ICD-10-CM

## 2023-02-21 DIAGNOSIS — K529 Noninfective gastroenteritis and colitis, unspecified: Secondary | ICD-10-CM | POA: Diagnosis not present

## 2023-02-21 DIAGNOSIS — Z923 Personal history of irradiation: Secondary | ICD-10-CM

## 2023-02-21 DIAGNOSIS — Z800 Family history of malignant neoplasm of digestive organs: Secondary | ICD-10-CM

## 2023-02-21 DIAGNOSIS — Z9012 Acquired absence of left breast and nipple: Secondary | ICD-10-CM

## 2023-02-21 DIAGNOSIS — Z91048 Other nonmedicinal substance allergy status: Secondary | ICD-10-CM

## 2023-02-21 DIAGNOSIS — G629 Polyneuropathy, unspecified: Secondary | ICD-10-CM | POA: Diagnosis present

## 2023-02-21 DIAGNOSIS — G4700 Insomnia, unspecified: Secondary | ICD-10-CM | POA: Diagnosis present

## 2023-02-21 DIAGNOSIS — Z1152 Encounter for screening for COVID-19: Secondary | ICD-10-CM | POA: Diagnosis not present

## 2023-02-21 LAB — RESP PANEL BY RT-PCR (RSV, FLU A&B, COVID)  RVPGX2
Influenza A by PCR: NEGATIVE
Influenza B by PCR: NEGATIVE
Resp Syncytial Virus by PCR: NEGATIVE
SARS Coronavirus 2 by RT PCR: NEGATIVE

## 2023-02-21 LAB — COMPREHENSIVE METABOLIC PANEL
ALT: 18 U/L (ref 0–44)
AST: 30 U/L (ref 15–41)
Albumin: 3.6 g/dL (ref 3.5–5.0)
Alkaline Phosphatase: 42 U/L (ref 38–126)
Anion gap: 9 (ref 5–15)
BUN: 33 mg/dL — ABNORMAL HIGH (ref 8–23)
CO2: 27 mmol/L (ref 22–32)
Calcium: 8.3 mg/dL — ABNORMAL LOW (ref 8.9–10.3)
Chloride: 103 mmol/L (ref 98–111)
Creatinine, Ser: 1.19 mg/dL — ABNORMAL HIGH (ref 0.44–1.00)
GFR, Estimated: 48 mL/min — ABNORMAL LOW (ref >60)
Glucose, Bld: 104 mg/dL — ABNORMAL HIGH (ref 70–99)
Potassium: 3.5 mmol/L (ref 3.5–5.1)
Sodium: 139 mmol/L (ref 135–145)
Total Bilirubin: 1.1 mg/dL (ref <1.2)
Total Protein: 6.7 g/dL (ref 6.5–8.1)

## 2023-02-21 LAB — STREP PNEUMONIAE URINARY ANTIGEN: Strep Pneumo Urinary Antigen: NEGATIVE

## 2023-02-21 LAB — CBC WITH DIFFERENTIAL/PLATELET
Abs Immature Granulocytes: 0.05 10*3/uL (ref 0.00–0.07)
Basophils Absolute: 0 10*3/uL (ref 0.0–0.1)
Basophils Relative: 0 %
Eosinophils Absolute: 0.1 10*3/uL (ref 0.0–0.5)
Eosinophils Relative: 1 %
HCT: 41.3 % (ref 36.0–46.0)
Hemoglobin: 13.7 g/dL (ref 12.0–15.0)
Immature Granulocytes: 1 %
Lymphocytes Relative: 11 %
Lymphs Abs: 0.6 10*3/uL — ABNORMAL LOW (ref 0.7–4.0)
MCH: 32.5 pg (ref 26.0–34.0)
MCHC: 33.2 g/dL (ref 30.0–36.0)
MCV: 97.9 fL (ref 80.0–100.0)
Monocytes Absolute: 0.3 10*3/uL (ref 0.1–1.0)
Monocytes Relative: 6 %
Neutro Abs: 4.8 10*3/uL (ref 1.7–7.7)
Neutrophils Relative %: 81 %
Platelets: 37 10*3/uL — ABNORMAL LOW (ref 150–400)
RBC: 4.22 MIL/uL (ref 3.87–5.11)
RDW: 14.4 % (ref 11.5–15.5)
WBC: 5.9 10*3/uL (ref 4.0–10.5)
nRBC: 0 % (ref 0.0–0.2)

## 2023-02-21 LAB — HIV ANTIBODY (ROUTINE TESTING W REFLEX): HIV Screen 4th Generation wRfx: NONREACTIVE

## 2023-02-21 LAB — VITAMIN D 25 HYDROXY (VIT D DEFICIENCY, FRACTURES): Vit D, 25-Hydroxy: 40.63 ng/mL (ref 30–100)

## 2023-02-21 LAB — TSH: TSH: 2.416 u[IU]/mL (ref 0.350–4.500)

## 2023-02-21 LAB — PROTIME-INR
INR: 1.1 (ref 0.8–1.2)
Prothrombin Time: 14.5 s (ref 11.4–15.2)

## 2023-02-21 LAB — MAGNESIUM: Magnesium: 1.9 mg/dL (ref 1.7–2.4)

## 2023-02-21 LAB — APTT: aPTT: 32 s (ref 24–36)

## 2023-02-21 LAB — LACTIC ACID, PLASMA
Lactic Acid, Venous: 2.1 mmol/L (ref 0.5–1.9)
Lactic Acid, Venous: 0.8 mmol/L (ref 0.5–1.9)

## 2023-02-21 LAB — PHOSPHORUS: Phosphorus: 2.7 mg/dL (ref 2.5–4.6)

## 2023-02-21 LAB — VITAMIN B12: Vitamin B-12: 919 pg/mL — ABNORMAL HIGH (ref 180–914)

## 2023-02-21 MED ORDER — SENNOSIDES-DOCUSATE SODIUM 8.6-50 MG PO TABS: 1.0000 | ORAL_TABLET | Freq: Every evening | ORAL | Status: DC | PRN

## 2023-02-21 MED ORDER — ONDANSETRON HCL 4 MG PO TABS: 4.0000 mg | ORAL_TABLET | Freq: Four times a day (QID) | ORAL | Status: DC | PRN

## 2023-02-21 MED ORDER — DM-GUAIFENESIN ER 30-600 MG PO TB12
1.0000 | ORAL_TABLET | Freq: Two times a day (BID) | ORAL | Status: DC
  Administered 2023-02-21 – 2023-02-23 (×4): 1 via ORAL
  Filled 2023-02-21 (×4): qty 1

## 2023-02-21 MED ORDER — ZOLPIDEM TARTRATE 5 MG PO TABS
5.0000 mg | ORAL_TABLET | Freq: Every evening | ORAL | Status: DC | PRN
  Administered 2023-02-21 – 2023-02-22 (×2): 5 mg via ORAL
  Filled 2023-02-21 (×2): qty 1

## 2023-02-21 MED ORDER — LEVOFLOXACIN IN D5W 750 MG/150ML IV SOLN
750.0000 mg | Freq: Once | INTRAVENOUS | Status: AC
  Administered 2023-02-21: 750 mg via INTRAVENOUS
  Filled 2023-02-21: qty 150

## 2023-02-21 MED ORDER — SODIUM CHLORIDE 0.9 % IV BOLUS
1000.0000 mL | Freq: Once | INTRAVENOUS | Status: AC
  Administered 2023-02-21: 1000 mL via INTRAVENOUS

## 2023-02-21 MED ORDER — ACETAMINOPHEN 325 MG PO TABS: 650.0000 mg | ORAL_TABLET | Freq: Four times a day (QID) | ORAL | Status: DC | PRN

## 2023-02-21 MED ORDER — ACETAMINOPHEN 650 MG RE SUPP: 650.0000 mg | Freq: Four times a day (QID) | RECTAL | Status: DC | PRN

## 2023-02-21 MED ORDER — ACETAMINOPHEN 500 MG PO TABS
1000.0000 mg | ORAL_TABLET | Freq: Once | ORAL | Status: AC
  Administered 2023-02-21: 1000 mg via ORAL
  Filled 2023-02-21: qty 2

## 2023-02-21 MED ORDER — LEVOFLOXACIN IN D5W 750 MG/150ML IV SOLN: 750.0000 mg | INTRAVENOUS | Status: DC

## 2023-02-21 MED ORDER — ONDANSETRON HCL 4 MG/2ML IJ SOLN
4.0000 mg | Freq: Four times a day (QID) | INTRAMUSCULAR | Status: DC | PRN
  Administered 2023-02-21: 4 mg via INTRAVENOUS
  Filled 2023-02-21: qty 2

## 2023-02-21 MED ORDER — DEXTROSE-SODIUM CHLORIDE 5-0.9 % IV SOLN
INTRAVENOUS | Status: AC
Start: 2023-02-21 — End: 2023-02-22

## 2023-02-21 MED ORDER — HYDROCHLOROTHIAZIDE 25 MG PO TABS
25.0000 mg | ORAL_TABLET | Freq: Every morning | ORAL | Status: DC
Start: 2023-02-22 — End: 2023-02-23
  Administered 2023-02-22 – 2023-02-23 (×2): 25 mg via ORAL
  Filled 2023-02-21 (×2): qty 1

## 2023-02-21 MED ORDER — METOPROLOL SUCCINATE ER 50 MG PO TB24
100.0000 mg | ORAL_TABLET | Freq: Every day | ORAL | Status: DC
  Administered 2023-02-21 – 2023-02-22 (×2): 100 mg via ORAL
  Filled 2023-02-21 (×2): qty 2

## 2023-02-21 MED ORDER — ENSURE ENLIVE PO LIQD
237.0000 mL | Freq: Three times a day (TID) | ORAL | Status: DC
  Administered 2023-02-21 – 2023-02-22 (×3): 237 mL via ORAL

## 2023-02-21 NOTE — ED Notes (Signed)
Kim with cl called for transport

## 2023-02-21 NOTE — ED Notes (Signed)
Attempted to call report to IP RN, was advised the RN is at lunch

## 2023-02-21 NOTE — ED Provider Notes (Signed)
[facility]ER[facility] DEPARTMENT AT [facility] Provider Note   CSN: [id number] Arrival date & time: 02/21/23  1006     History  Chief Complaint  Patient presents with   URI    Marie Anderson is a 77 y.o. female.  Patient here with nausea vomiting diarrhea cough sputum production.  History of MDS.  Not on immunologic's.  Denies any abdominal pain.  No chest pain shortness of breath.  She is felt very weak.  Symptoms for 2 days.  She had a fever.  Denies any pain with urination.  The history is provided by the patient.       Home Medications Prior to Admission medications   Medication Sig Start Date End Date Taking? Authorizing Provider  Calcium Carb-Cholecalciferol (CALCIUM CARBONATE-VITAMIN D3 PO) Take by mouth. 10/04/12   [provider]  Calcium Carbonate-Vit D-Min (CALCIUM 1200 PO) Take 1 tablet by mouth every morning.    [provider]  cholecalciferol (VITAMIN D) 1000 UNITS tablet Take by mouth daily.     [provider]  cyanocobalamin 100 MCG tablet Take 100 mcg by mouth daily.    [provider]  fluocinonide-emollient (LIDEX-E) 0.05 % cream Apply 1 application  topically daily as needed (welps and rash.). 01/04/13   [provider]  gabapentin (NEURONTIN) 300 MG capsule Take 300 mg by mouth at bedtime.    [provider]  hydrochlorothiazide (HYDRODIURIL) 25 MG tablet Take 1 tablet (25 mg total) by mouth daily. 07/22/22   Cannon Kettle, PA-C  methylPREDNISolone (MEDROL DOSEPAK) 4 MG TBPK tablet Use as directed 10/28/22   Serena Croissant, MD  metoprolol succinate (TOPROL-XL) 100 MG 24 hr tablet Take 1 tablet (100 mg total) by mouth daily. TAKE WITH OR IMMEDIATELY FOLLOWING A MEAL. 07/22/22   Cannon Kettle, PA-C  NONFORMULARY OR COMPOUNDED ITEM Apply 1-2 gm to both feet 3-4x daily prn. 12/30/22   Freddie Breech, DPM  pilocarpine (PILOCAR) 1 % ophthalmic solution 1 drop into affected eye Ophthalmic  Three times a day    [provider]  pilocarpine (SALAGEN) 5 MG tablet     [provider]  vitamin E 200 UNIT capsule Take 200 Units by mouth every morning.     [provider]  zolpidem (AMBIEN CR) 12.5 MG CR tablet Take 1 tablet (12.5 mg total) by mouth at bedtime as needed. for sleep 01/20/23   Serena Croissant, MD      Allergies    Cephalexin, Cephalosporins, Codeine, and Tape    Review of Systems   Review of Systems  Physical Exam Updated Vital Signs BP (!) 108/49   Pulse (!) 119   Temp 100.2 F (37.9 C) (Oral)   Resp (!) 21   Wt 81.6 kg   SpO2 99%   BMI 29.95 kg/m  Physical Exam Vitals and nursing note reviewed.  Constitutional:      General: She is not in acute distress.    Appearance: She is well-developed. She is not ill-appearing.  HENT:     Head: Normocephalic and atraumatic.     Mouth/Throat:     Mouth: Mucous membranes are moist.  Eyes:     Extraocular Movements: Extraocular movements intact.     Conjunctiva/sclera: Conjunctivae normal.     Pupils: Pupils are equal, round, and reactive to light.  Cardiovascular:     Rate and Rhythm: Regular rhythm. Tachycardia present.     Heart sounds: Normal heart sounds. No murmur heard. Pulmonary:  Effort: Pulmonary effort is normal. No respiratory distress.     Breath sounds: Normal breath sounds.  Abdominal:     Palpations: Abdomen is soft.     Tenderness: There is no abdominal tenderness.  Musculoskeletal:        General: No swelling.     Cervical back: Normal range of motion and neck supple.  Skin:    General: Skin is warm and dry.     Capillary Refill: Capillary refill takes less than 2 seconds.  Neurological:     General: No focal deficit present.     Mental Status: She is alert and oriented to person, place, and time. Mental status is at baseline.     Cranial Nerves: No cranial nerve deficit.     Sensory: No sensory deficit.     Motor: No weakness.     Coordination:  Coordination normal.     Comments: 5+ out of 5 strength throughout, normal sensation, no drift, normal finger-to-nose finger, normal speech  Psychiatric:        Mood and Affect: Mood normal.     ED Results / Procedures / Treatments   Labs (all labs ordered are listed, but only abnormal results are displayed) Labs Reviewed  LACTIC ACID, PLASMA - Abnormal; Notable for the following components:      Result Value   Lactic Acid, Venous 2.1 (*)    All other components within normal limits  COMPREHENSIVE METABOLIC PANEL - Abnormal; Notable for the following components:   Glucose, Bld 104 (*)    BUN 33 (*)    Creatinine, Ser 1.19 (*)    Calcium 8.3 (*)    GFR, Estimated 48 (*)    All other components within normal limits  CBC WITH DIFFERENTIAL/PLATELET - Abnormal; Notable for the following components:   Platelets 37 (*)    Lymphs Abs 0.6 (*)    All other components within normal limits  RESP PANEL BY RT-PCR (RSV, FLU A&B, COVID)  [id number]  CULTURE, BLOOD (ROUTINE X 2)  CULTURE, BLOOD (ROUTINE X 2)  LACTIC ACID, PLASMA  PROTIME-INR  APTT  URINALYSIS, W/ REFLEX TO CULTURE (INFECTION SUSPECTED)    EKG EKG Interpretation Date/Time:  Sunday February 21 2023 11:31:07 EST Ventricular Rate:  122 PR Interval:  165 QRS Duration:  87 QT Interval:  304 QTC Calculation: 433 R Axis:   11  Text Interpretation: Sinus tachycardia Confirmed by Virgina Norfolk [phone]) on 02/21/2023 11:42:33 AM  Radiology DG Chest Port 1 View Result Date: 02/21/2023 CLINICAL DATA:  Questionable sepsis EXAM: PORTABLE CHEST 1 VIEW COMPARISON:  12/23/2011 FINDINGS: Haziness of the lower chest attributed to soft tissue attenuation but there is some streaky density in the lower lungs. No edema, effusion, or pneumothorax. Normal heart size and mediastinal contours. IMPRESSION: No definitive pneumonia but there are increased markings at the bases and early bronchopneumonia is possible. Electronically Signed   By:  Tiburcio Pea M.D.   On: 02/21/2023 11:40    Procedures .Critical Care  Performed by: Virgina Norfolk, DO Authorized by: Virgina Norfolk, DO   Critical care provider statement:    Critical care time (minutes):  35   Critical care was necessary to treat or prevent imminent or life-threatening deterioration of the following conditions:  Sepsis   Critical care was time spent personally by me on the following activities:  Development of treatment plan with patient or surrogate, blood draw for specimens, discussions with consultants, discussions with primary provider, examination of patient, obtaining history from patient  or surrogate, ordering and performing treatments and interventions, ordering and review of laboratory studies, pulse oximetry, re-evaluation of patient's condition, review of old charts and ordering and review of radiographic studies   Care discussed with: admitting provider       Medications Ordered in ED Medications  levofloxacin (LEVAQUIN) IVPB 750 mg (0 mg Intravenous Stopped 02/21/23 1257)  acetaminophen (TYLENOL) tablet 1,000 mg (1,000 mg Oral Given 02/21/23 1123)  sodium chloride 0.9 % bolus 1,000 mL (0 mLs Intravenous Stopped 02/21/23 1224)  sodium chloride 0.9 % bolus 1,000 mL (1,000 mLs Intravenous New Bag/Given 02/21/23 1335)    ED Course/ Medical Decision Making/ A&P                                 Medical Decision Making Amount and/or Complexity of Data Reviewed Labs: ordered. Radiology: ordered. ECG/medicine tests: ordered.  Risk OTC drugs. Prescription drug management. Decision regarding hospitalization.   SAUMYA DUGUE is here with fever, nausea vomiting diarrhea, cough sputum production.  History of MDS.  History of antiphospholipid syndrome.  Overall patient with fever and tachycardia.  Code sepsis initiated.  Blood cultures lactic acid basic labs broad-spectrum IV antibiotics chest x-ray urinalysis and viral testing ordered.  Per my review  and interpretation of labs she had a lactic acidosis of 2.1.  However white count is normal.  Creatinine mildly elevated to 1.1.  COVID and flu and RSV test are negative.  Chest x-ray may be with pneumonia per my review and interpretation as well as radiology report.  Overall covering for community-acquired pneumonia.  Discussed with medicine to admit for further sepsis rule out.  Hemodynamics have improved.  Tachycardia improving.  Lactic acid has cleared.  Patient to be admitted for further care.  This chart was dictated using voice recognition software.  Despite best efforts to proofread,  errors can occur which can change the documentation meaning.         Final Clinical Impression(s) / ED Diagnoses Final diagnoses:  Community acquired pneumonia, unspecified laterality  Sepsis, due to unspecified organism, unspecified whether acute organ dysfunction present [facility])    Rx / DC Orders ED Discharge Orders     None         Virgina Norfolk, DO 02/21/23 1403

## 2023-02-21 NOTE — Progress Notes (Signed)
ED Pharmacy Antibiotic Sign Off An antibiotic consult was received from an ED provider for levofloxacin per pharmacy dosing for pneumonia. A chart review was completed to assess appropriateness.  A single dose of levofloxacin placed by the ED provider.   The following one time order(s) were placed per pharmacy consult: None  Further antibiotic and/or antibiotic pharmacy consults should be ordered by the admitting provider if indicated.   Thank you for allowing pharmacy to be a part of this patient's care.   Delmar Landau, PharmD, BCPS 02/21/2023 10:41 AM ED Clinical Pharmacist -  [phone]

## 2023-02-21 NOTE — Progress Notes (Signed)
PHARMACY NOTE:  ANTIMICROBIAL RENAL DOSAGE ADJUSTMENT  Current antimicrobial regimen includes a mismatch between antimicrobial dosage and estimated renal function.  As per policy approved by the Pharmacy & Therapeutics and Medical Executive Committees, the antimicrobial dosage will be adjusted accordingly.  Current antimicrobial dosage:  CAP  Indication: levofloxacin 750 mg IV daily x 4   Renal Function:  Estimated Creatinine Clearance: 43.1 mL/min (A) (by C-G formula based on SCr of 1.19 mg/dL (H)). []      On intermittent HD, scheduled: []      On CRRT    Antimicrobial dosage has been changed to:   - levofloxacin 750 mg IV q48h x 2   Additional comments:    Adalberto Cole, PharmD, BCPS 02/21/2023 5:00 PM

## 2023-02-21 NOTE — ED Triage Notes (Signed)
Dec 12 started with fatigue,thought it was a cold. Last Friday started vomiting, and one episode of diarrhea. No fevers documented but subjective fevers

## 2023-02-21 NOTE — H&P (Addendum)
History and Physical    Patient: Marie Anderson BJY:[id number] DOB: 08/15/1945 DOA: 02/21/2023 DOS: the patient was seen and examined on 02/21/2023 PCP: Daisy Floro, MD  Patient coming from: [facility]  Chief Complaint:  Chief Complaint  Patient presents with   URI   HPI: Marie Anderson is a 77 y.o. female with medical history significant of HTN, antiphospholipid syndrome, myelodysplastic syndrome, SVT, breast cancer s/p mastectomy and neuropathy who [facility] for evaluation of generalized weakness, N/V and URI symptoms.  Patient reports that over the last 2 weeks, she has had a cold with congestion and sore throat that she has been managing with NyQuil and DayQuil with only mild improvement. She has been really weak over the last few days but friday night, she has really bad rigors and also experience some nausea and vomiting.  She has developed a cough over the last 2 days and had 1 episode soft stools yesterday.  She also reports associated fatigue, lightheadedness and subjective fevers but denies any abdominal pain, headaches, blurry vision, chest pain, shortness of breath, bloody stools, or palpitations.   ED course: Initial vitals with temp 100.2, RR 15, HR 135, BP 122/86, SpO2 97% on room air. Labs show sodium 139, K+ 3.5, creatinine 1.19, WBC 5.9 (from baseline of 2.9-3.2), Hgb 13.7, platelet 37 (baseline of 35-45), PT/INR 14.5/1.1, lactic acid 2.1->0.8.  EKG shows sinus tach with HR 122 CXR shows possible early bronchopneumonia Received 2 L IV fluid bolus, IV Levaquin and Tylenol 1000 mg x 1 Admitted to [facility] service and transferred to [facility]  Review of Systems: As mentioned in the history of present illness. All other systems reviewed and are negative. Past Medical History:  Diagnosis Date   Anemia    Antiphospholipid antibody syndrome (HCC)    Breast cancer (HCC) 12/04/11   left- inv ductal ca, DCIS, ER/PR +, HER 2 +   Family history of breast  cancer    Family history of ovarian cancer    Family history of pancreatic cancer    History of chemotherapy 12/31/11 -03/11/12   s/p 4 cycles   History of radiation therapy 05/04/12-06/20/12   left breast/   Myelodysplasia    Neuropathy    PONV (postoperative nausea and vomiting)    Skin cancer    Melanoma at 60   SVT (supraventricular tachycardia) (HCC)    Past Surgical History:  Procedure Laterality Date   AUGMENTATION MAMMAPLASTY Bilateral    2014 with reduction on right   BREAST RECONSTRUCTION WITH PLACEMENT OF TISSUE EXPANDER AND FLEX HD (ACELLULAR HYDRATED DERMIS)  04/06/2012   Procedure: BREAST RECONSTRUCTION WITH PLACEMENT OF TISSUE EXPANDER AND FLEX HD (ACELLULAR HYDRATED DERMIS);  Surgeon: Wayland Denis, DO;  Location: [facility];  Service: Plastics;  Laterality: Left;  IMMEDIATE LEFT BREAST RECONSTRUCTION WITH PLACEMENT OF TISSUE EXPANDER AND ALLODERM   COLONOSCOPY     DILATION AND CURETTAGE OF UTERUS     EYE SURGERY     age 70-lt eye growth   LIPOSUCTION WITH LIPOFILLING N/A 01/19/2013   Procedure: LIPOSUCTION WITH LIPOFILLING;  Surgeon: Wayland Denis, DO;  Location: [place] [facility];  Service: Plastics;  Laterality: N/A;   MASTECTOMY Left    2014   MASTECTOMY W/ SENTINEL NODE BIOPSY  04/06/2012   Procedure: MASTECTOMY WITH SENTINEL LYMPH NODE BIOPSY;  Surgeon: Emelia Loron, MD;  Location: [facility];  Service: General;  Laterality: Left;  left mastectomy, left axillary sentinel node biopsy  MASTOPEXY Right 01/19/2013   Procedure: RIGHT BREAST PLACEMENT OF IMPLANT WITH MASTOPEXY;  Surgeon: Wayland Denis, DO;  Location: [facility];  Service: Plastics;  Laterality: Right;   PORT-A-CATH REMOVAL Right 01/19/2013   Procedure: REMOVAL PORT-A-CATH;  Surgeon: Emelia Loron, MD;  Location: [facility];  Service: General;  Laterality: Right;   PORTACATH PLACEMENT  12/23/2011   Procedure: INSERTION PORT-A-CATH;   Surgeon: Emelia Loron, MD;  Location: [facility];  Service: General;  Laterality: Right;   REMOVAL OF TISSUE EXPANDER AND PLACEMENT OF IMPLANT Left 01/19/2013   Procedure: REMOVAL OF LEFT TISSUE EXPANDERS WITH PLACEMENT OF LEFT BREAST IMPLANT;  Surgeon: Wayland Denis, DO;  Location: [facility];  Service: Plastics;  Laterality: Left;   Social History:  reports that she has never smoked. She has never used smokeless tobacco. She reports current alcohol use. She reports that she does not use drugs.  Allergies  Allergen Reactions   Cephalexin    Cephalosporins     headache   Codeine Nausea Only   Tape Other (See Comments)    Sensitivity to bandaids and tape that cause blisters and redness    Family History  Problem Relation Age of Onset   Stroke Mother    Cancer Father        prostate   Kidney cancer Maternal Uncle    Cancer Paternal Uncle        ? bone cancer   Stroke Maternal Grandmother    Heart attack Maternal Grandfather    Pancreatic cancer Cousin 17       maternal first cousin - mother is identical twin to pateint's mother   Cancer Cousin        unknown cancer in maternal first cousin   Ovarian cancer Cousin    Pancreatic cancer Cousin 84    Prior to Admission medications   Medication Sig Start Date End Date Taking? Authorizing Provider  Calcium Carb-Cholecalciferol (CALCIUM CARBONATE-VITAMIN D3 PO) Take by mouth. 10/04/12   [provider]  Calcium Carbonate-Vit D-Min (CALCIUM 1200 PO) Take 1 tablet by mouth every morning.    [provider]  cholecalciferol (VITAMIN D) 1000 UNITS tablet Take by mouth daily.     [provider]  cyanocobalamin 100 MCG tablet Take 100 mcg by mouth daily.    [provider]  fluocinonide-emollient (LIDEX-E) 0.05 % cream Apply 1 application  topically daily as needed (welps and rash.). 01/04/13   [provider]  gabapentin (NEURONTIN) 300 MG capsule Take 300 mg by  mouth at bedtime.    [provider]  hydrochlorothiazide (HYDRODIURIL) 25 MG tablet Take 1 tablet (25 mg total) by mouth daily. 07/22/22   Cannon Kettle, PA-C  methylPREDNISolone (MEDROL DOSEPAK) 4 MG TBPK tablet Use as directed 10/28/22   Serena Croissant, MD  metoprolol succinate (TOPROL-XL) 100 MG 24 hr tablet Take 1 tablet (100 mg total) by mouth daily. TAKE WITH OR IMMEDIATELY FOLLOWING A MEAL. 07/22/22   Cannon Kettle, PA-C  NONFORMULARY OR COMPOUNDED ITEM Apply 1-2 gm to both feet 3-4x daily prn. 12/30/22   Freddie Breech, DPM  pilocarpine (PILOCAR) 1 % ophthalmic solution 1 drop into affected eye Ophthalmic Three times a day    [provider]  pilocarpine (SALAGEN) 5 MG tablet     [provider]  vitamin E 200 UNIT capsule Take 200 Units by mouth every morning.     [provider]  zolpidem (AMBIEN CR)  12.5 MG CR tablet Take 1 tablet (12.5 mg total) by mouth at bedtime as needed. for sleep 01/20/23   Serena Croissant, MD    Physical Exam: Vitals:   02/21/23 1130 02/21/23 1200 02/21/23 1230 02/21/23 1449  BP: 98/69 111/60 (!) 108/49 127/72  Pulse:   (!) 119   Resp: (!) 22 (!) 23 (!) 21 18  Temp:    98.6 F (37 C)  TempSrc:    Oral  SpO2:   99% 96%  Weight:       General: Pleasant, ill-appearing elderly woman laying in bed. No acute distress. HEENT: [place]/AT. Anicteric sclera. Dry mucous membrane. Well-defined soft tissue swelling on hard palate, without erythema. Faint petechiae in the buccal mucosa. CV: RRR. No murmurs, rubs, or gallops. No LE edema Pulmonary: Lungs CTAB. Normal effort. No wheezing or rales. Abdominal: Soft, nontender, nondistended. Normal bowel sounds. Extremities: Palpable radial and DP pulses. Normal ROM. Skin: Warm and dry. No obvious rash or lesions.  Dry mucous membrane. Neuro: A&Ox3. Moves all extremities. Normal sensation to light touch. No focal deficit. Psych: Normal mood and affect  Data Reviewed: Labs  show sodium 139, K+ 3.5, creatinine 1.19, WBC 5.9 (from baseline of 2.9-3.2), Hgb 13.7, platelet 37 (baseline of 35-45), PT/INR 14.5/1.1, lactic acid 2.1->0.8.  EKG shows sinus tach with HR 122 CXR shows possible early bronchopneumonia  Assessment and Plan: Marie Anderson is a 77 y.o. female with medical history significant of HTN, antiphospholipid syndrome, myelodysplastic syndrome, SVT, breast cancer s/p mastectomy and neuropathy who presented [facility] for evaluation of generalized weakness, N/V and URI symptoms and admitted for bronchopneumonia.  # Bronchopneumonia # Community-acquired pneumonia Immunocompromise elderly patient with MDS presented with 2 weeks of URI with associated cough, poor p.o. intake, fever/chills and weakness over the last few days found to have evidence of early bronchopneumonia on imaging. No leukocytosis however white count elevated from baseline. Patient has allergy to cephalosporins. S/p 1 dose of Levaquin in the ED. -Continue IV Levaquin every 24 hours for additional 4 doses -Mucinex DM twice daily -As needed Tylenol for fever or headache -Follow-up sputum culture, urinary strep and Legionella  # Generalized weakness # Dehydration Patient with recent poor p.o. intake secondary to respiratory infection and some nausea as well as poor appetite.  BP remained stable however patient dry on exam. S/p 2 L IVF in the ED.  -D5 IVNS 100 cc/hr for additional 10 hours -Protein shakes between meals -Check TSH, mag, Phos and vitamin D - PT/OT eval  # AKI Creatinine of 1.19 on admission from normal baseline likely secondary to dehydration in the setting of recent poor p.o. intake and URI symptoms. S/p 2 L of IV NS. -Continue IV hydration as above -Follow-up morning BMP  # HTN BP stable with SBP in the 110s to 120s. -Continue HCTZ 25 mg and Toprol XL 100 mg daily  # Hx of MDS Reports this was diagnosed about 10 years ago after her breast cancer. She  follows with oncology and they have been monitoring her blood counts and platelets. Baseline WBC around 3.0 and baseline platelet around 35-45. CBC on admission shows WBC 5.9, Hgb 13.7 and platelet 37. She denies any bruising or active bleeding but has been fatigued for the last few days. -Trend CBC -Follow-up with oncology in the outpatient  # Insomnia -Continue Ambien as needed for sleep  # Hx of neuropathy -Follow-up Vitamin B12   Advance Care Planning:   Code Status: Full Code  Consults: None  Family Communication: No family at bedside  Severity of Illness: The appropriate patient status for this patient is OBSERVATION. Observation status is judged to be reasonable and necessary in order to provide the required intensity of service to ensure the patient's safety. The patient's presenting symptoms, physical exam findings, and initial radiographic and laboratory data in the context of their medical condition is felt to place them at decreased risk for further clinical deterioration. Furthermore, it is anticipated that the patient will be medically stable for discharge from the hospital within 2 midnights of admission.   Author: Steffanie Rainwater, MD 02/21/2023 3:10 PM  For on call review [email or web address].

## 2023-02-21 NOTE — Sepsis Progress Note (Signed)
Sepsis protocol monitored by eLink ?

## 2023-02-21 NOTE — ED Notes (Signed)
CRITICAL VALUE STICKER  CRITICAL VALUE:Lactate 2.1  RECEIVER (on-site recipient of call):Carmon Ginsberg, RN  DATE & TIME NOTIFIED:   MESSENGER (representative from lab):  MD NOTIFIED: [place]  TIME OF NOTIFICATION:1158  RESPONSE:

## 2023-02-22 DIAGNOSIS — Z91048 Other nonmedicinal substance allergy status: Secondary | ICD-10-CM | POA: Diagnosis not present

## 2023-02-22 DIAGNOSIS — I10 Essential (primary) hypertension: Secondary | ICD-10-CM | POA: Diagnosis present

## 2023-02-22 DIAGNOSIS — K529 Noninfective gastroenteritis and colitis, unspecified: Secondary | ICD-10-CM | POA: Diagnosis present

## 2023-02-22 DIAGNOSIS — D709 Neutropenia, unspecified: Secondary | ICD-10-CM | POA: Diagnosis present

## 2023-02-22 DIAGNOSIS — Z885 Allergy status to narcotic agent status: Secondary | ICD-10-CM | POA: Diagnosis not present

## 2023-02-22 DIAGNOSIS — Z85820 Personal history of malignant melanoma of skin: Secondary | ICD-10-CM | POA: Diagnosis not present

## 2023-02-22 DIAGNOSIS — D6861 Antiphospholipid syndrome: Secondary | ICD-10-CM | POA: Diagnosis present

## 2023-02-22 DIAGNOSIS — Z853 Personal history of malignant neoplasm of breast: Secondary | ICD-10-CM | POA: Diagnosis not present

## 2023-02-22 DIAGNOSIS — Z881 Allergy status to other antibiotic agents status: Secondary | ICD-10-CM | POA: Diagnosis not present

## 2023-02-22 DIAGNOSIS — Z9882 Breast implant status: Secondary | ICD-10-CM | POA: Diagnosis not present

## 2023-02-22 DIAGNOSIS — Z8249 Family history of ischemic heart disease and other diseases of the circulatory system: Secondary | ICD-10-CM | POA: Diagnosis not present

## 2023-02-22 DIAGNOSIS — Z9221 Personal history of antineoplastic chemotherapy: Secondary | ICD-10-CM | POA: Diagnosis not present

## 2023-02-22 DIAGNOSIS — Z9012 Acquired absence of left breast and nipple: Secondary | ICD-10-CM | POA: Diagnosis not present

## 2023-02-22 DIAGNOSIS — N179 Acute kidney failure, unspecified: Secondary | ICD-10-CM | POA: Diagnosis present

## 2023-02-22 DIAGNOSIS — Z79899 Other long term (current) drug therapy: Secondary | ICD-10-CM | POA: Diagnosis not present

## 2023-02-22 DIAGNOSIS — Z923 Personal history of irradiation: Secondary | ICD-10-CM | POA: Diagnosis not present

## 2023-02-22 DIAGNOSIS — D469 Myelodysplastic syndrome, unspecified: Secondary | ICD-10-CM | POA: Diagnosis present

## 2023-02-22 DIAGNOSIS — J189 Pneumonia, unspecified organism: Secondary | ICD-10-CM | POA: Diagnosis present

## 2023-02-22 DIAGNOSIS — D849 Immunodeficiency, unspecified: Secondary | ICD-10-CM | POA: Diagnosis present

## 2023-02-22 DIAGNOSIS — D696 Thrombocytopenia, unspecified: Secondary | ICD-10-CM | POA: Diagnosis present

## 2023-02-22 DIAGNOSIS — G629 Polyneuropathy, unspecified: Secondary | ICD-10-CM | POA: Diagnosis present

## 2023-02-22 DIAGNOSIS — Z1152 Encounter for screening for COVID-19: Secondary | ICD-10-CM | POA: Diagnosis not present

## 2023-02-22 DIAGNOSIS — E860 Dehydration: Secondary | ICD-10-CM | POA: Diagnosis present

## 2023-02-22 DIAGNOSIS — J180 Bronchopneumonia, unspecified organism: Secondary | ICD-10-CM | POA: Diagnosis present

## 2023-02-22 DIAGNOSIS — G4700 Insomnia, unspecified: Secondary | ICD-10-CM | POA: Diagnosis present

## 2023-02-22 LAB — BASIC METABOLIC PANEL
Anion gap: 6 (ref 5–15)
BUN: 18 mg/dL (ref 8–23)
CO2: 22 mmol/L (ref 22–32)
Calcium: 7.4 mg/dL — ABNORMAL LOW (ref 8.9–10.3)
Chloride: 111 mmol/L (ref 98–111)
Creatinine, Ser: 0.72 mg/dL (ref 0.44–1.00)
GFR, Estimated: >60 mL/min (ref >60)
Glucose, Bld: 90 mg/dL (ref 70–99)
Potassium: 3.6 mmol/L (ref 3.5–5.1)
Sodium: 139 mmol/L (ref 135–145)

## 2023-02-22 LAB — CBC WITH DIFFERENTIAL/PLATELET
Abs Immature Granulocytes: 0.02 10*3/uL (ref 0.00–0.07)
Basophils Absolute: 0 10*3/uL (ref 0.0–0.1)
Basophils Relative: 1 %
Eosinophils Absolute: 0.1 10*3/uL (ref 0.0–0.5)
Eosinophils Relative: 3 %
HCT: 36.3 % (ref 36.0–46.0)
Hemoglobin: 11.8 g/dL — ABNORMAL LOW (ref 12.0–15.0)
Immature Granulocytes: 0 %
Lymphocytes Relative: 28 %
Lymphs Abs: 1.4 10*3/uL (ref 0.7–4.0)
MCH: 32.7 pg (ref 26.0–34.0)
MCHC: 32.5 g/dL (ref 30.0–36.0)
MCV: 100.6 fL — ABNORMAL HIGH (ref 80.0–100.0)
Monocytes Absolute: 0.4 10*3/uL (ref 0.1–1.0)
Monocytes Relative: 8 %
Neutro Abs: 3 10*3/uL (ref 1.7–7.7)
Neutrophils Relative %: 60 %
Platelets: 32 10*3/uL — ABNORMAL LOW (ref 150–400)
RBC: 3.61 MIL/uL — ABNORMAL LOW (ref 3.87–5.11)
RDW: 14.4 % (ref 11.5–15.5)
WBC: 4.9 10*3/uL (ref 4.0–10.5)
nRBC: 0 % (ref 0.0–0.2)

## 2023-02-22 LAB — EXPECTORATED SPUTUM ASSESSMENT W GRAM STAIN, RFLX TO RESP C

## 2023-02-22 MED ORDER — AMOXICILLIN-POT CLAVULANATE 875-125 MG PO TABS
1.0000 | ORAL_TABLET | Freq: Two times a day (BID) | ORAL | Status: DC
  Administered 2023-02-22 – 2023-02-23 (×3): 1 via ORAL
  Filled 2023-02-22 (×3): qty 1

## 2023-02-22 MED ORDER — DOXYCYCLINE HYCLATE 100 MG PO TABS: 100.0000 mg | ORAL_TABLET | Freq: Two times a day (BID) | ORAL | Status: DC

## 2023-02-22 MED ORDER — LEVOFLOXACIN IN D5W 750 MG/150ML IV SOLN: 750.0000 mg | INTRAVENOUS | Status: DC

## 2023-02-22 MED ORDER — DOXYCYCLINE HYCLATE 100 MG PO TABS
100.0000 mg | ORAL_TABLET | Freq: Two times a day (BID) | ORAL | Status: DC
  Administered 2023-02-22 – 2023-02-23 (×3): 100 mg via ORAL
  Filled 2023-02-22 (×3): qty 1

## 2023-02-22 MED ORDER — AMOXICILLIN-POT CLAVULANATE 875-125 MG PO TABS: 1.0000 | ORAL_TABLET | Freq: Two times a day (BID) | ORAL | Status: DC

## 2023-02-22 MED ORDER — LEVOFLOXACIN IN D5W 750 MG/150ML IV SOLN
750.0000 mg | INTRAVENOUS | Status: DC
  Filled 2023-02-22: qty 150

## 2023-02-22 NOTE — Plan of Care (Signed)
  Problem: Clinical Measurements: Goal: Respiratory complications will improve Outcome: Progressing   Problem: Activity: Goal: Risk for activity intolerance will decrease Outcome: Progressing   Problem: Nutrition: Goal: Adequate nutrition will be maintained Outcome: Progressing   

## 2023-02-22 NOTE — Progress Notes (Signed)
OT Cancellation Note  Patient Details Name: Marie Anderson MRN: [id number] DOB: 1945-03-21   Cancelled Treatment:    Reason Eval/Treat Not Completed: OT screened, no needs identified, will sign off Patient reported she is independent in room for Ad[facility]. Patient reporting she does not need OT. OT to sign off.  Rosalio Loud, MS Acute Rehabilitation Department Office# [phone]   02/22/2023, 3:29 PM

## 2023-02-22 NOTE — Progress Notes (Signed)
PROGRESS NOTE  DESIA CAPP  WUJ:[id number] DOB: Mar 14, 1945 DOA: 02/21/2023 PCP: Daisy Floro, MD   Brief Narrative: Patient is a 79 female with history of hypertension, antiphospholipid syndrome, myelodysplastic syndrome, SVT, breast cancer status post mastectomy who presented [facility] for further evaluation of generalized weakness, nausea/vomiting, upper respiratory tract symptoms, cough.  On presentation she was mildly febrile, tachycardic but normotensive.  Lab work showed creatinine of 1.1, platelet of 37, lactic acid of 2.1.  Chest x-ray showed possible early bronchopneumonia.  Started on IV fluids and antibiotics.  Cultures pending  Assessment & Plan:  Principal Problem:   Bronchopneumonia Active Problems:   Generalized weakness   Community acquired pneumonia   AKI (acute kidney injury) (HCC)  Bronchopneumonia/community-acquired pneumonia: Presented with cough, weakness, fever, chills, poor oral intake from home.  Chest imaging showed increased markings on lower lung fields, suspicious for bronchopneumonia.  Started on antibiotics.  Follow-up cultures. Currently she is on room air, coughing is usually will improve.  Denies any shortness of breath  Generalized weakness/deconditioning: PT/OT consulted.  AKI: Resolved with IV fluid  Hypertension: On hydrochlorothiazide, Toprol at home.  Currently blood pressure stable.  History of MDS: Diagnosed about 10 years ago.  Follows with hematology, Dr. Pamelia Hoit.  Has chronic thrombocytopenia, neutropenia.  Currently platelets level at baseline  Insomnia: On Ambien          DVT prophylaxis:SCDs Start: 02/21/23 1556     Code Status: Full Code  Family Communication: None at the bedside  Patient status: Inpatient  Patient is from : Home  Anticipated discharge to: Home  Estimated DC date: Tomorrow   Consultants: None  Procedures: None  Antimicrobials:  Anti-infectives (From admission, onward)    Start      Dose/Rate Route Frequency Ordered Stop   02/23/23 1000  levofloxacin (LEVAQUIN) IVPB 750 mg  Status:  Discontinued        750 mg 100 mL/hr over 90 Minutes Intravenous Every 48 hours 02/21/23 1601 02/22/23 0704   02/23/23 1000  levofloxacin (LEVAQUIN) IVPB 750 mg  Status:  Discontinued        750 mg 100 mL/hr over 90 Minutes Intravenous Every 24 hours 02/22/23 0704 02/22/23 0705   02/22/23 1000  levofloxacin (LEVAQUIN) IVPB 750 mg  Status:  Discontinued        750 mg 100 mL/hr over 90 Minutes Intravenous Every 24 hours 02/22/23 0705 02/22/23 0857   02/22/23 1000  amoxicillin-clavulanate (AUGMENTIN) 875-125 MG per tablet 1 tablet  Status:  Discontinued        1 tablet Oral Every 12 hours 02/22/23 0857 02/22/23 0858   02/22/23 1000  doxycycline (VIBRA-TABS) tablet 100 mg  Status:  Discontinued        100 mg Oral Every 12 hours 02/22/23 0857 02/22/23 0858   02/22/23 1000  amoxicillin-clavulanate (AUGMENTIN) 875-125 MG per tablet 1 tablet        1 tablet Oral Every 12 hours 02/22/23 0858 02/26/23 0959   02/22/23 1000  doxycycline (VIBRA-TABS) tablet 100 mg        100 mg Oral Every 12 hours 02/22/23 0858 02/26/23 0959   02/22/23 0705  levofloxacin (LEVAQUIN) IVPB 750 mg  Status:  Discontinued        750 mg 100 mL/hr over 90 Minutes Intravenous Every 24 hours 02/22/23 0705 02/22/23 0705   02/21/23 1045  levofloxacin (LEVAQUIN) IVPB 750 mg        750 mg 100 mL/hr over 90 Minutes Intravenous  Once  02/21/23 1038 02/21/23 1257       Subjective: Patient seen and examined at bedside today.  Hemodynamically stable comfortable.  Lying in bed.  Appears little weak but feels greatly improved.  Does not complain of any worsening shortness of breath or cough.  On room air.  Objective: Vitals:   02/21/23 1751 02/21/23 2050 02/21/23 2102 02/22/23 0645  BP: (!) 127/93  (!) 143/86 (!) 152/82  Pulse: (!) 101  65 86  Resp: 16  18 18   Temp: 98 F (36.7 C)  99.5 F (37.5 C) 98.4 F (36.9 C)   TempSrc: Oral  Oral Oral  SpO2: 96%  96% 96%  Weight:  81.6 kg    Height:  5\' 5"  (1.651 m)      Intake/Output Summary (Last 24 hours) at 02/22/2023 0926 Last data filed at 02/22/2023 0600 Gross per 24 hour  Intake 4161.75 ml  Output 1000 ml  Net 3161.75 ml   Filed Weights   02/21/23 1033 02/21/23 2050  Weight: 81.6 kg 81.6 kg    Examination:  General exam: Overall comfortable, not in distress HEENT: PERRL Respiratory system: Mild bibasilar crackles Cardiovascular system: S1 & S2 heard, RRR.  Gastrointestinal system: Abdomen is nondistended, soft and nontender. Central nervous system: Alert and oriented Extremities: No edema, no clubbing ,no cyanosis Skin: No rashes, no ulcers,no icterus     Data Reviewed: I have personally reviewed following labs and imaging studies  CBC: Recent Labs  Lab 02/21/23 1113 02/22/23 0719  WBC 5.9 4.9  NEUTROABS 4.8 3.0  HGB 13.7 11.8*  HCT 41.3 36.3  MCV 97.9 100.6*  PLT 37* 32*   Basic Metabolic Panel: Recent Labs  Lab 02/21/23 1113 02/21/23 1613 02/22/23 0448  NA 139  --  139  K 3.5  --  3.6  CL 103  --  111  CO2 27  --  22  GLUCOSE 104*  --  90  BUN 33*  --  18  CREATININE 1.19*  --  0.72  CALCIUM 8.3*  --  7.4*  MG  --  1.9  --   PHOS  --  2.7  --      Recent Results (from the past 240 hours)  Resp panel by RT-PCR (RSV, Flu A&B, Covid) Anterior Nasal Swab     Status: None   Collection Time: 02/21/23 10:36 AM   Specimen: Anterior Nasal Swab  Result Value Ref Range Status   SARS Coronavirus 2 by RT PCR NEGATIVE NEGATIVE Final    Comment: (NOTE) SARS-CoV-2 target nucleic acids are NOT DETECTED.  The SARS-CoV-2 RNA is generally detectable in upper respiratory specimens during the acute phase of infection. The lowest concentration of SARS-CoV-2 viral copies this assay can detect is 138 copies/mL. A negative result does not preclude SARS-Cov-2 infection and should not be used as the sole basis for treatment  or other patient management decisions. A negative result may occur with  improper specimen collection/handling, submission of specimen other than nasopharyngeal swab, presence of viral mutation(s) within the areas targeted by this assay, and inadequate number of viral copies(<138 copies/mL). A negative result must be combined with clinical observations, patient history, and epidemiological information. The expected result is Negative.  Fact Sheet for Patients:  BloggerCourse.com  Fact Sheet for Healthcare Providers:  SeriousBroker.it  This test is no t yet approved or cleared by the [place] FDA and  has been authorized for detection and/or diagnosis of SARS-CoV-2 by FDA under an Emergency Use Authorization (EUA).  This EUA will remain  in effect (meaning this test can be used) for the duration of the COVID-19 declaration under Section 564(b)(1) of the Act, 21 U.S.C.section 360bbb-3(b)(1), unless the authorization is terminated  or revoked sooner.       Influenza A by PCR NEGATIVE NEGATIVE Final   Influenza B by PCR NEGATIVE NEGATIVE Final    Comment: (NOTE) The Xpert Xpress SARS-CoV-2/FLU/RSV plus assay is intended as an aid in the diagnosis of influenza from Nasopharyngeal swab specimens and should not be used as a sole basis for treatment. Nasal washings and aspirates are unacceptable for Xpert Xpress SARS-CoV-2/FLU/RSV testing.  Fact Sheet for Patients: BloggerCourse.com  Fact Sheet for Healthcare Providers: SeriousBroker.it  This test is not yet approved or cleared by the [place] FDA and has been authorized for detection and/or diagnosis of SARS-CoV-2 by FDA under an Emergency Use Authorization (EUA). This EUA will remain in effect (meaning this test can be used) for the duration of the COVID-19 declaration under Section 564(b)(1) of the Act, 21 U.S.C. section  360bbb-3(b)(1), unless the authorization is terminated or revoked.     Resp Syncytial Virus by PCR NEGATIVE NEGATIVE Final    Comment: (NOTE) Fact Sheet for Patients: BloggerCourse.com  Fact Sheet for Healthcare Providers: SeriousBroker.it  This test is not yet approved or cleared by the [place] FDA and has been authorized for detection and/or diagnosis of SARS-CoV-2 by FDA under an Emergency Use Authorization (EUA). This EUA will remain in effect (meaning this test can be used) for the duration of the COVID-19 declaration under Section 564(b)(1) of the Act, 21 U.S.C. section 360bbb-3(b)(1), unless the authorization is terminated or revoked.  Performed at [facility], [address] [place], [place] [address]   Blood Culture (routine x 2)     Status: None (Preliminary result)   Collection Time: 02/21/23 11:13 AM   Specimen: BLOOD  Result Value Ref Range Status   Specimen Description   Final    BLOOD RIGHT ANTECUBITAL Performed at [facility], [address] [place], [place] [address]    Special Requests   Final    BOTTLES DRAWN AEROBIC AND ANAEROBIC Blood Culture results may not be optimal due to an inadequate volume of blood received in culture bottles Performed at [facility], [address] [place], [place] [address]    Culture   Final    NO GROWTH < 24 HOURS Performed at [facility], [address] [place], [place] [address]    Report Status PENDING  Incomplete  Blood Culture (routine x 2)     Status: None (Preliminary result)   Collection Time: 02/21/23 11:14 AM   Specimen: BLOOD  Result Value Ref Range Status   Specimen Description   Final    BLOOD RIGHT ANTECUBITAL Performed at [facility], [address] [place], [place] [address]    Special Requests   Final    BOTTLES DRAWN AEROBIC AND ANAEROBIC Blood Culture  adequate volume Performed at [facility], [address], [place], [place] [address]    Culture   Final    NO GROWTH < 24 HOURS Performed at [facility], [address], [place], [place] [address]    Report Status PENDING  Incomplete     Radiology Studies: DG Chest Port 1 View Result Date: 02/21/2023 CLINICAL DATA:  Questionable sepsis EXAM: PORTABLE CHEST 1 VIEW COMPARISON:  12/23/2011 FINDINGS: Haziness of the lower chest attributed to soft tissue  attenuation but there is some streaky density in the lower lungs. No edema, effusion, or pneumothorax. Normal heart size and mediastinal contours. IMPRESSION: No definitive pneumonia but there are increased markings at the bases and early bronchopneumonia is possible. Electronically Signed   By: Tiburcio Pea M.D.   On: 02/21/2023 11:40    Scheduled Meds:  amoxicillin-clavulanate  1 tablet Oral Q12H   dextromethorphan-guaiFENesin  1 tablet Oral BID   doxycycline  100 mg Oral Q12H   feeding supplement  237 mL Oral TID BM   hydrochlorothiazide  25 mg Oral q AM   metoprolol succinate  100 mg Oral QHS   Continuous Infusions:   LOS: 0 days   Burnadette Pop, MD [facility] P12/23/2024, 9:26 AM

## 2023-02-22 NOTE — Progress Notes (Signed)
PHARMACY NOTE:  ANTIMICROBIAL RENAL DOSAGE ADJUSTMENT  Current antimicrobial regimen includes a mismatch between antimicrobial dosage and estimated renal function.  As per policy approved by the Pharmacy & Therapeutics and Medical Executive Committees, the antimicrobial dosage will be adjusted accordingly.  Current antimicrobial dosage:  Levaquin 750 mg IV q48h  Indication: PNA  Renal Function:  Estimated Creatinine Clearance: 64.1 mL/min (by C-G formula based on SCr of 0.72 mg/dL). []      On intermittent HD, scheduled: []      On CRRT    Antimicrobial dosage has been changed to:  750 mg IV q24h    Thank you for allowing pharmacy to be a part of this patient's care.  Lucia Gaskins, [facility] 02/22/2023 7:03 AM

## 2023-02-22 NOTE — Progress Notes (Signed)
Mobility Specialist - Progress Note   02/22/23 1336  Mobility  Activity Ambulated with assistance in hallway;Ambulated with assistance to bathroom  Level of Assistance Contact guard assist, steadying assist  Assistive Device  (HHA)  Distance Ambulated (ft) 500 ft  Activity Response Tolerated well  Mobility Referral Yes  Mobility visit 1 Mobility  Mobility Specialist Start Time (ACUTE ONLY) 1319  Mobility Specialist Stop Time (ACUTE ONLY) 1336  Mobility Specialist Time Calculation (min) (ACUTE ONLY) 17 min   Pt received in recliner and agreeable to mobility. Pt required hand held assistance during session. Prior to ambulating, pt requested assistance to the bathroom. No complaints during session. Pt to recliner after session with all needs met. Chair alarm on.     [facility]

## 2023-02-22 NOTE — Evaluation (Signed)
Physical Therapy Evaluation Patient Details Name: Marie Anderson MRN: [id number] DOB: 09/12/1945 Today's Date: 02/22/2023  History of Present Illness  Patient is a 59 female who presented to ED 02/20/23 for  generalized weakness, nausea/vomiting, upper respiratory tract symptoms, cough, febrile, tachycardic. Chest x-ray showed possible early bronchopneumonia. PMH: hypertension, antiphospholipid syndrome, myelodysplastic syndrome, SVT, breast cancer status post mastectomy.  Clinical Impression  The patient is eager to ambulate, noted with mild balance loss upon ambulating, required HHA for 300'  walk. Patient should progress to return to independence. Patient has family support as needed.  Pt admitted with above diagnosis.  Pt currently with functional limitations due to the deficits listed below (see PT Problem List). Pt will benefit from acute skilled PT to increase their independence and safety with mobility to allow discharge.           If plan is discharge home, recommend the following: Help with stairs or ramp for entrance;Assist for transportation;Assistance with cooking/housework   Can travel by private vehicle        Equipment Recommendations None recommended by PT  Recommendations for Other Services       Functional Status Assessment Patient has had a recent decline in their functional status and demonstrates the ability to make significant improvements in function in a reasonable and predictable amount of time.     Precautions / Restrictions Precautions Precautions: Fall Restrictions Weight Bearing Restrictions Per Provider Order: No      Mobility  Bed Mobility               General bed mobility comments: in recliner    Transfers Overall transfer level: Independent                      Ambulation/Gait Ambulation/Gait assistance: Contact guard assist, Min assist Gait Distance (Feet): 300 Feet Assistive device: 1 person hand held assist Gait  Pattern/deviations: Step-through pattern, Staggering left Gait velocity: decreased     General Gait Details: one balance loss when not supported with HHA  Stairs            Wheelchair Mobility     Tilt Bed    Modified Rankin (Stroke Patients Only)       Balance                                             Pertinent Vitals/Pain Pain Assessment Pain Assessment: No/denies pain    Home Living Family/patient expects to be discharged to:: Private residence Living Arrangements: Alone Available Help at Discharge: Family;Available 24 hours/day Type of Home: House Home Access: Stairs to enter   [employer] of Steps: 2   Home Layout: One level Home Equipment: Grab bars - tub/shower Additional Comments: walking stick    Prior Function Prior Level of Function : Independent/Modified Independent                     Extremity/Trunk Assessment   Upper Extremity Assessment Upper Extremity Assessment: Overall WFL for tasks assessed    Lower Extremity Assessment Lower Extremity Assessment: Generalized weakness    Cervical / Trunk Assessment Cervical / Trunk Assessment: Normal  Communication   Communication Communication: No apparent difficulties  Cognition Arousal: Alert Behavior During Therapy: WFL for tasks assessed/performed Overall Cognitive Status: Within Functional Limits for tasks assessed  General Comments      Exercises     Assessment/Plan    PT Assessment Patient needs continued PT services  PT Problem List Decreased strength;Decreased activity tolerance;Decreased balance       PT Treatment Interventions DME instruction;Therapeutic activities;Gait training;Functional mobility training;Therapeutic exercise;Patient/family education    PT Goals (Current goals can be found in the Care Plan section)  Acute Rehab PT Goals Patient Stated Goal: go home  soon PT Goal Formulation: With patient Time For Goal Achievement: 03/01/23 Potential to Achieve Goals: Good    Frequency Min 1X/week     Co-evaluation               AM-PAC PT "6 Clicks" Mobility  Outcome Measure Help needed turning from your back to your side while in a flat bed without using bedrails?: None Help needed moving from lying on your back to sitting on the side of a flat bed without using bedrails?: None Help needed moving to and from a bed to a chair (including a wheelchair)?: A Little Help needed standing up from a chair using your arms (e.g., wheelchair or bedside chair)?: A Little Help needed to walk in hospital room?: A Little Help needed climbing 3-5 steps with a railing? : A Little 6 Click Score: 20    End of Session   Activity Tolerance: Patient tolerated treatment well Patient left: in chair;with call bell/phone within reach;with chair alarm set Nurse Communication: Mobility status PT Visit Diagnosis: Unsteadiness on feet (R26.81);Difficulty in walking, not elsewhere classified (R26.2)    Time: 5009-3818 PT Time Calculation (min) (ACUTE ONLY): 20 min   Charges:   PT Evaluation $PT Eval Low Complexity: 1 Low   PT General Charges $$ ACUTE PT VISIT: 1 Visit         Blanchard Kelch PT Acute Rehabilitation Services Office [phone] Weekend pager-[phone]   Rada Hay 02/22/2023, 11:00 AM

## 2023-02-23 DIAGNOSIS — J180 Bronchopneumonia, unspecified organism: Secondary | ICD-10-CM | POA: Diagnosis not present

## 2023-02-23 LAB — LEGIONELLA PNEUMOPHILA SEROGP 1 UR AG: L. pneumophila Serogp 1 Ur Ag: NEGATIVE

## 2023-02-23 MED ORDER — DOXYCYCLINE HYCLATE 100 MG PO TABS: 100.0000 mg | ORAL_TABLET | Freq: Two times a day (BID) | ORAL | 0 refills | Status: DC

## 2023-02-23 MED ORDER — AMOXICILLIN-POT CLAVULANATE 875-125 MG PO TABS: 1.0000 | ORAL_TABLET | Freq: Two times a day (BID) | ORAL | 0 refills | Status: DC

## 2023-02-23 MED ORDER — DM-GUAIFENESIN ER 30-600 MG PO TB12: 1.0000 | ORAL_TABLET | Freq: Two times a day (BID) | ORAL | 0 refills | Status: AC

## 2023-02-23 NOTE — Discharge Summary (Signed)
Physician Discharge Summary  Marie Anderson ZOX:[id number] DOB: 06-Jan-1946 DOA: 02/21/2023  PCP: Daisy Floro, MD  Admit date: 02/21/2023 Discharge date: 02/23/2023  Admitted From: Home Disposition:  Home  Discharge Condition:Stable CODE STATUS:FULL Diet recommendation: Heart Healthy  Brief/Interim Summary: Patient is a 60 female with history of hypertension, antiphospholipid syndrome, myelodysplastic syndrome, SVT, breast cancer status post mastectomy who [facility] for further evaluation of generalized weakness, nausea/vomiting, upper respiratory tract symptoms, cough.  On presentation she was mildly febrile, tachycardic but normotensive.  Lab work showed creatinine of 1.1, platelet of 37, lactic acid of 2.1.  Chest x-ray showed possible early bronchopneumonia.  Started on IV fluids and antibiotics.  Cultures havent shown any growth.  She has significantly improved with antibiotics.  She is currently on room air.  Medically stable for discharge home with oral antibiotics  Following problems were addressed during the hospitalization:  Bronchopneumonia/community-acquired pneumonia: Presented with cough, weakness, fever, chills, poor oral intake from home.  Chest imaging showed increased markings on lower lung fields, suspicious for bronchopneumonia.  Started on antibiotics. Cultures negative so far. Currently she is on room air, coughing is improved.  Antibiotics changed to oral   Generalized weakness/deconditioning: PT/OT consulted.  No follow-up recommended   AKI: Resolved with IV fluid   Hypertension: On hydrochlorothiazide, Toprol at home.  Currently blood pressure stable.   History of MDS: Diagnosed about 10 years ago.  Follows with hematology, Dr. Pamelia Hoit.  Has chronic thrombocytopenia, neutropenia.  Currently platelets level at baseline   Insomnia: On Ambien   Discharge Diagnoses:  Principal Problem:   Bronchopneumonia Active Problems:   Generalized  weakness   Community acquired pneumonia   AKI (acute kidney injury) [facility])    Discharge Instructions  Discharge Instructions     Diet - low sodium heart healthy   Complete by: As directed    Discharge instructions   Complete by: As directed    1)Please take prescribed medications as instructed 2)Follow up with your PCP in a week   Increase activity slowly   Complete by: As directed       Allergies as of 02/23/2023       Reactions   Cephalexin Other (See Comments)   Headache and nausea; Pt stated that she tolerated other abx in this class   Codeine Nausea Only   Tape Other (See Comments)   Sensitivity to bandaids and tape that cause blisters and redness        Medication List     TAKE these medications    amoxicillin-clavulanate 875-125 MG tablet Commonly known as: AUGMENTIN Take 1 tablet by mouth every 12 (twelve) hours.   CALCIUM 1200 PO Take 1 tablet by mouth every morning.   CALCIUM CARBONATE-VITAMIN D3 PO Take by mouth.   cholecalciferol 1000 units tablet Commonly known as: VITAMIN D Take by mouth daily.   cyanocobalamin 100 MCG tablet Take 100 mcg by mouth daily.   dextromethorphan-guaiFENesin 30-600 MG 12hr tablet Commonly known as: MUCINEX DM Take 1 tablet by mouth 2 (two) times daily for 5 days.   doxycycline 100 MG tablet Commonly known as: VIBRA-TABS Take 1 tablet (100 mg total) by mouth every 12 (twelve) hours.   hydrochlorothiazide 25 MG tablet Commonly known as: HYDRODIURIL Take 1 tablet (25 mg total) by mouth daily. What changed: when to take this   metoprolol succinate 100 MG 24 hr tablet Commonly known as: TOPROL-XL Take 1 tablet (100 mg total) by mouth daily. TAKE WITH OR IMMEDIATELY  FOLLOWING A MEAL. What changed: when to take this   TYLENOL PO Take 1 tablet by mouth daily as needed (Migraine).   vitamin E 200 UNIT capsule Take 200 Units by mouth every morning.   zolpidem 12.5 MG CR tablet Commonly known as: AMBIEN  CR Take 1 tablet (12.5 mg total) by mouth at bedtime as needed. for sleep        Follow-up Information     Daisy Floro, MD. Schedule an appointment as soon as possible for a visit in 1 week(s).   Specialty: Family Medicine Contact information: [address] [place] [address] [phone]                Allergies  Allergen Reactions   Cephalexin Other (See Comments)    Headache and nausea; Pt stated that she tolerated other abx in this class   Codeine Nausea Only   Tape Other (See Comments)    Sensitivity to bandaids and tape that cause blisters and redness    Consultations: None   Procedures/Studies: DG Chest Port 1 View Result Date: 02/21/2023 CLINICAL DATA:  Questionable sepsis EXAM: PORTABLE CHEST 1 VIEW COMPARISON:  12/23/2011 FINDINGS: Haziness of the lower chest attributed to soft tissue attenuation but there is some streaky density in the lower lungs. No edema, effusion, or pneumothorax. Normal heart size and mediastinal contours. IMPRESSION: No definitive pneumonia but there are increased markings at the bases and early bronchopneumonia is possible. Electronically Signed   By: Tiburcio Pea M.D.   On: 02/21/2023 11:40      Subjective: Patient seen and examined at bedside today.  Hemodynamically stable.  Comfortable.  On room air.  Medically stable for discharge.  Discharge Exam: Vitals:   02/23/23 0544 02/23/23 0915  BP: (!) 149/59 (!) 158/72  Pulse: 75 81  Resp: 18 18  Temp: 97.9 F (36.6 C)   SpO2: 95% 97%   Vitals:   02/22/23 1658 02/22/23 2138 02/23/23 0544 02/23/23 0915  BP: (!) 159/79 (!) 165/89 (!) 149/59 (!) 158/72  Pulse: 86 90 75 81  Resp: 14 18 18 18   Temp: 98.6 F (37 C) 99.9 F (37.7 C) 97.9 F (36.6 C)   TempSrc: Oral Oral Oral   SpO2: 98% 96% 95% 97%  Weight:      Height:        General: Pt is alert, awake, not in acute distress Cardiovascular: RRR, S1/S2 +, no rubs, no gallops Respiratory: CTA  bilaterally, no wheezing, no rhonchi Abdominal: Soft, NT, ND, bowel sounds + Extremities: no edema, no cyanosis    The results of significant diagnostics from this hospitalization (including imaging, microbiology, ancillary and laboratory) are listed below for reference.     Microbiology: Recent Results (from the past 240 hours)  Expectorated Sputum Assessment w Gram Stain, Rflx to Resp Cult     Status: None   Collection Time: 02/21/23  9:49 AM   Specimen: Expectorated Sputum  Result Value Ref Range Status   Specimen Description EXPECTORATED SPUTUM  Final   Special Requests NONE  Final   Sputum evaluation   Final    THIS SPECIMEN IS ACCEPTABLE FOR SPUTUM CULTURE Performed at [facility], [address], [place], [place] [address]    Report Status 02/22/2023 FINAL  Final  Culture, Respiratory w Gram Stain     Status: None (Preliminary result)   Collection Time: 02/21/23  9:49 AM  Result Value Ref Range Status   Specimen Description   Final  EXPECTORATED SPUTUM Performed at [facility], [address] [place], [place] [address]    Special Requests   Final    NONE Reflexed from [id number] Performed at [facility], [address], [place], [place] [address]    Gram Stain   Final    FEW WBC PRESENT,BOTH PMN AND MONONUCLEAR RARE GRAM VARIABLE ROD    Culture   Final    CULTURE REINCUBATED FOR BETTER GROWTH Performed at [facility], [address] [place], [place] [address]    Report Status PENDING  Incomplete  Resp panel by RT-PCR (RSV, Flu A&B, Covid) Anterior Nasal Swab     Status: None   Collection Time: 02/21/23 10:36 AM   Specimen: Anterior Nasal Swab  Result Value Ref Range Status   SARS Coronavirus 2 by RT PCR NEGATIVE NEGATIVE Final    Comment: (NOTE) SARS-CoV-2 target nucleic acids are NOT DETECTED.  The SARS-CoV-2 RNA is generally detectable in upper respiratory specimens during the acute phase of  infection. The lowest concentration of SARS-CoV-2 viral copies this assay can detect is 138 copies/mL. A negative result does not preclude SARS-Cov-2 infection and should not be used as the sole basis for treatment or other patient management decisions. A negative result may occur with  improper specimen collection/handling, submission of specimen other than nasopharyngeal swab, presence of viral mutation(s) within the areas targeted by this assay, and inadequate number of viral copies(<138 copies/mL). A negative result must be combined with clinical observations, patient history, and epidemiological information. The expected result is Negative.  Fact Sheet for Patients:  BloggerCourse.com  Fact Sheet for Healthcare Providers:  SeriousBroker.it  This test is no t yet approved or cleared by the [place] FDA and  has been authorized for detection and/or diagnosis of SARS-CoV-2 by FDA under an Emergency Use Authorization (EUA). This EUA will remain  in effect (meaning this test can be used) for the duration of the COVID-19 declaration under Section 564(b)(1) of the Act, 21 U.S.C.section 360bbb-3(b)(1), unless the authorization is terminated  or revoked sooner.       Influenza A by PCR NEGATIVE NEGATIVE Final   Influenza B by PCR NEGATIVE NEGATIVE Final    Comment: (NOTE) The Xpert Xpress SARS-CoV-2/FLU/RSV plus assay is intended as an aid in the diagnosis of influenza from Nasopharyngeal swab specimens and should not be used as a sole basis for treatment. Nasal washings and aspirates are unacceptable for Xpert Xpress SARS-CoV-2/FLU/RSV testing.  Fact Sheet for Patients: BloggerCourse.com  Fact Sheet for Healthcare Providers: SeriousBroker.it  This test is not yet approved or cleared by the [place] FDA and has been authorized for detection and/or diagnosis of SARS-CoV-2  by FDA under an Emergency Use Authorization (EUA). This EUA will remain in effect (meaning this test can be used) for the duration of the COVID-19 declaration under Section 564(b)(1) of the Act, 21 U.S.C. section 360bbb-3(b)(1), unless the authorization is terminated or revoked.     Resp Syncytial Virus by PCR NEGATIVE NEGATIVE Final    Comment: (NOTE) Fact Sheet for Patients: BloggerCourse.com  Fact Sheet for Healthcare Providers: SeriousBroker.it  This test is not yet approved or cleared by the [place] FDA and has been authorized for detection and/or diagnosis of SARS-CoV-2 by FDA under an Emergency Use Authorization (EUA). This EUA will remain in effect (meaning this test can be used) for the duration of the COVID-19 declaration under Section 564(b)(1) of the Act, 21 U.S.C. section 360bbb-3(b)(1), unless the  authorization is terminated or revoked.  Performed at [facility], [address] [place], [place] [address]   Blood Culture (routine x 2)     Status: None (Preliminary result)   Collection Time: 02/21/23 11:13 AM   Specimen: BLOOD  Result Value Ref Range Status   Specimen Description   Final    BLOOD RIGHT ANTECUBITAL Performed at [facility], [address] [place], [place] [address]    Special Requests   Final    BOTTLES DRAWN AEROBIC AND ANAEROBIC Blood Culture results may not be optimal due to an inadequate volume of blood received in culture bottles Performed at [facility], [address] [place], [place] [address]    Culture   Final    NO GROWTH 2 DAYS Performed at [facility], [address], [place], [place] [address]    Report Status PENDING  Incomplete  Blood Culture (routine x 2)     Status: None (Preliminary result)   Collection Time: 02/21/23 11:14 AM   Specimen: BLOOD  Result Value Ref Range Status   Specimen Description    Final    BLOOD RIGHT ANTECUBITAL Performed at [facility], [address], [place], [place] [address]    Special Requests   Final    BOTTLES DRAWN AEROBIC AND ANAEROBIC Blood Culture adequate volume Performed at [facility], [address] [place], [place] [address]    Culture   Final    NO GROWTH 2 DAYS Performed at [facility], [address], [place], [place] [address]    Report Status PENDING  Incomplete     Labs: BNP (last 3 results) No results for input(s): "BNP" in the last 8760 hours. Basic Metabolic Panel: Recent Labs  Lab 02/21/23 1113 02/21/23 1613 02/22/23 0448  NA 139  --  139  K 3.5  --  3.6  CL 103  --  111  CO2 27  --  22  GLUCOSE 104*  --  90  BUN 33*  --  18  CREATININE 1.19*  --  0.72  CALCIUM 8.3*  --  7.4*  MG  --  1.9  --   PHOS  --  2.7  --    Liver Function Tests: Recent Labs  Lab 02/21/23 1113  AST 30  ALT 18  ALKPHOS 42  BILITOT 1.1  PROT 6.7  ALBUMIN 3.6   No results for input(s): "LIPASE", "AMYLASE" in the last 168 hours. No results for input(s): "AMMONIA" in the last 168 hours. CBC: Recent Labs  Lab 02/21/23 1113 02/22/23 0719  WBC 5.9 4.9  NEUTROABS 4.8 3.0  HGB 13.7 11.8*  HCT 41.3 36.3  MCV 97.9 100.6*  PLT 37* 32*   Cardiac Enzymes: No results for input(s): "CKTOTAL", "CKMB", "CKMBINDEX", "TROPONINI" in the last 168 hours. BNP: Invalid input(s): "POCBNP" CBG: No results for input(s): "GLUCAP" in the last 168 hours. D-Dimer No results for input(s): "DDIMER" in the last 72 hours. Hgb A1c No results for input(s): "HGBA1C" in the last 72 hours. Lipid Profile No results for input(s): "CHOL", "HDL", "LDLCALC", "TRIG", "CHOLHDL", "LDLDIRECT" in the last 72 hours. Thyroid function studies Recent Labs    02/21/23 1613  TSH 2.416   Anemia work up Recent Labs    02/21/23 1613  VITAMINB12 919*   Urinalysis No results found for: "COLORURINE", "APPEARANCEUR",  "LABSPEC", "PHURINE", "GLUCOSEU", "HGBUR", "BILIRUBINUR", "KETONESUR", "PROTEINUR", "UROBILINOGEN", "NITRITE", "LEUKOCYTESUR" Sepsis Labs Recent Labs  Lab 02/21/23 1113 02/22/23  0719  WBC 5.9 4.9   Microbiology Recent Results (from the past 240 hours)  Expectorated Sputum Assessment w Gram Stain, Rflx to Resp Cult     Status: None   Collection Time: 02/21/23  9:49 AM   Specimen: Expectorated Sputum  Result Value Ref Range Status   Specimen Description EXPECTORATED SPUTUM  Final   Special Requests NONE  Final   Sputum evaluation   Final    THIS SPECIMEN IS ACCEPTABLE FOR SPUTUM CULTURE Performed at [facility], [address], [place], [place] [address]    Report Status 02/22/2023 FINAL  Final  Culture, Respiratory w Gram Stain     Status: None (Preliminary result)   Collection Time: 02/21/23  9:49 AM  Result Value Ref Range Status   Specimen Description   Final    EXPECTORATED SPUTUM Performed at [facility], [address], [place], [place] [address]    Special Requests   Final    NONE Reflexed from [id number] Performed at [facility], [address], [place], [place] [address]    Gram Stain   Final    FEW WBC PRESENT,BOTH PMN AND MONONUCLEAR RARE GRAM VARIABLE ROD    Culture   Final    CULTURE REINCUBATED FOR BETTER GROWTH Performed at [facility], [address], [place], [place] [address]    Report Status PENDING  Incomplete  Resp panel by RT-PCR (RSV, Flu A&B, Covid) Anterior Nasal Swab     Status: None   Collection Time: 02/21/23 10:36 AM   Specimen: Anterior Nasal Swab  Result Value Ref Range Status   SARS Coronavirus 2 by RT PCR NEGATIVE NEGATIVE Final    Comment: (NOTE) SARS-CoV-2 target nucleic acids are NOT DETECTED.  The SARS-CoV-2 RNA is generally detectable in upper respiratory specimens during the acute phase of infection. The lowest concentration of SARS-CoV-2 viral copies this assay can  detect is 138 copies/mL. A negative result does not preclude SARS-Cov-2 infection and should not be used as the sole basis for treatment or other patient management decisions. A negative result may occur with  improper specimen collection/handling, submission of specimen other than nasopharyngeal swab, presence of viral mutation(s) within the areas targeted by this assay, and inadequate number of viral copies(<138 copies/mL). A negative result must be combined with clinical observations, patient history, and epidemiological information. The expected result is Negative.  Fact Sheet for Patients:  BloggerCourse.com  Fact Sheet for Healthcare Providers:  SeriousBroker.it  This test is no t yet approved or cleared by the [place] FDA and  has been authorized for detection and/or diagnosis of SARS-CoV-2 by FDA under an Emergency Use Authorization (EUA). This EUA will remain  in effect (meaning this test can be used) for the duration of the COVID-19 declaration under Section 564(b)(1) of the Act, 21 U.S.C.section 360bbb-3(b)(1), unless the authorization is terminated  or revoked sooner.       Influenza A by PCR NEGATIVE NEGATIVE Final   Influenza B by PCR NEGATIVE NEGATIVE Final    Comment: (NOTE) The Xpert Xpress SARS-CoV-2/FLU/RSV plus assay is intended as an aid in the diagnosis of influenza from Nasopharyngeal swab specimens and should not be used as a sole basis for treatment. Nasal washings and aspirates are unacceptable for Xpert Xpress SARS-CoV-2/FLU/RSV testing.  Fact Sheet for Patients: BloggerCourse.com  Fact Sheet for Healthcare Providers: SeriousBroker.it  This test is not yet approved or cleared by the [place] and has been authorized  for detection and/or diagnosis of SARS-CoV-2 by FDA under an Emergency Use Authorization (EUA). This EUA will remain in  effect (meaning this test can be used) for the duration of the COVID-19 declaration under Section 564(b)(1) of the Act, 21 U.S.C. section 360bbb-3(b)(1), unless the authorization is terminated or revoked.     Resp Syncytial Virus by PCR NEGATIVE NEGATIVE Final    Comment: (NOTE) Fact Sheet for Patients: BloggerCourse.com  Fact Sheet for Healthcare Providers: SeriousBroker.it  This test is not yet approved or cleared by the [place] FDA and has been authorized for detection and/or diagnosis of SARS-CoV-2 by FDA under an Emergency Use Authorization (EUA). This EUA will remain in effect (meaning this test can be used) for the duration of the COVID-19 declaration under Section 564(b)(1) of the Act, 21 U.S.C. section 360bbb-3(b)(1), unless the authorization is terminated or revoked.  Performed at [facility], [address], [place], [place] [address]   Blood Culture (routine x 2)     Status: None (Preliminary result)   Collection Time: 02/21/23 11:13 AM   Specimen: BLOOD  Result Value Ref Range Status   Specimen Description   Final    BLOOD RIGHT ANTECUBITAL Performed at [facility], [address] [place], [place] [address]    Special Requests   Final    BOTTLES DRAWN AEROBIC AND ANAEROBIC Blood Culture results may not be optimal due to an inadequate volume of blood received in culture bottles Performed at [facility], [address] [place], [place] [address]    Culture   Final    NO GROWTH 2 DAYS Performed at [facility], [address], [place], [place] [address]    Report Status PENDING  Incomplete  Blood Culture (routine x 2)     Status: None (Preliminary result)   Collection Time: 02/21/23 11:14 AM   Specimen: BLOOD  Result Value Ref Range Status   Specimen Description   Final    BLOOD RIGHT ANTECUBITAL Performed at [facility], [address] [place], [place] [address]    Special Requests   Final    BOTTLES DRAWN AEROBIC AND ANAEROBIC Blood Culture adequate volume Performed at [facility], [address], [place], [place] [address]    Culture   Final    NO GROWTH 2 DAYS Performed at [facility], [address] [place], [place] [address]    Report Status PENDING  Incomplete    Please note: You were cared for by a hospitalist during your hospital stay. Once you are discharged, your primary care physician will handle any further medical issues. Please note that NO REFILLS for any discharge medications will be authorized once you are discharged, as it is imperative that you return to your primary care physician (or establish a relationship with a primary care physician if you do not have one) for your post hospital discharge needs so that they can reassess your need for medications and monitor your lab values.    Time coordinating discharge: 40 minutes  SIGNED:   Burnadette Pop, MD  [facility] 02/23/2023, 11:05 AM Pager [phone]  If 7PM-7AM, please contact night-coverage [email or web address] Password [email or web address]

## 2023-02-23 NOTE — Plan of Care (Signed)
  Problem: Clinical Measurements: Goal: Diagnostic test results will improve Outcome: Progressing   Problem: Clinical Measurements: Goal: Respiratory complications will improve Outcome: Progressing   Problem: Activity: Goal: Risk for activity intolerance will decrease Outcome: Adequate for Discharge

## 2023-02-23 NOTE — Progress Notes (Signed)
Patient was given discharge instructions by Baltazar Najjar, and all questions were answered. Patient was stable for discharge and was taken to the main exit by wheelchair.

## 2023-02-25 LAB — CULTURE, RESPIRATORY W GRAM STAIN: Culture: NORMAL

## 2023-02-26 LAB — CULTURE, BLOOD (ROUTINE X 2)
Culture: NO GROWTH
Culture: NO GROWTH
Special Requests: ADEQUATE

## 2023-03-04 DIAGNOSIS — Z6829 Body mass index (BMI) 29.0-29.9, adult: Secondary | ICD-10-CM | POA: Diagnosis not present

## 2023-03-04 DIAGNOSIS — D696 Thrombocytopenia, unspecified: Secondary | ICD-10-CM | POA: Diagnosis not present

## 2023-03-04 DIAGNOSIS — R899 Unspecified abnormal finding in specimens from other organs, systems and tissues: Secondary | ICD-10-CM | POA: Diagnosis not present

## 2023-03-04 DIAGNOSIS — B3731 Acute candidiasis of vulva and vagina: Secondary | ICD-10-CM | POA: Diagnosis not present

## 2023-03-04 DIAGNOSIS — J180 Bronchopneumonia, unspecified organism: Secondary | ICD-10-CM | POA: Diagnosis not present

## 2023-03-04 DIAGNOSIS — Z09 Encounter for follow-up examination after completed treatment for conditions other than malignant neoplasm: Secondary | ICD-10-CM | POA: Diagnosis not present

## 2023-04-14 ENCOUNTER — Encounter: Payer: Self-pay | Admitting: Podiatry

## 2023-04-14 ENCOUNTER — Ambulatory Visit (INDEPENDENT_AMBULATORY_CARE_PROVIDER_SITE_OTHER): Payer: Medicare Other | Admitting: Podiatry

## 2023-04-14 VITALS — Ht 65.0 in | Wt 179.0 lb

## 2023-04-14 DIAGNOSIS — B351 Tinea unguium: Secondary | ICD-10-CM

## 2023-04-14 DIAGNOSIS — Q828 Other specified congenital malformations of skin: Secondary | ICD-10-CM | POA: Diagnosis not present

## 2023-04-14 DIAGNOSIS — M79674 Pain in right toe(s): Secondary | ICD-10-CM

## 2023-04-14 DIAGNOSIS — M79675 Pain in left toe(s): Secondary | ICD-10-CM

## 2023-04-14 DIAGNOSIS — G620 Drug-induced polyneuropathy: Secondary | ICD-10-CM | POA: Diagnosis not present

## 2023-04-14 NOTE — Progress Notes (Signed)
 Subjective:  Patient ID: Marie Anderson, female    DOB: [birth year],  MRN: [id number]  Marie Anderson presents to clinic today for: at risk foot care with history of chemotherapy induced neuropathy and painful porokeratotic lesion(s) left foot and painful mycotic toenails that limit ambulation. Painful toenails interfere with ambulation. Aggravating factors include wearing enclosed shoe gear. Pain is relieved with periodic professional debridement. Painful porokeratotic lesions are aggravated when weightbearing with and without shoegear. Pain is relieved with periodic professional debridement.  Chief Complaint  Patient presents with   RFC    She is here for nail trim and " zit on the left foot on the bottom". PCP is Dr. Tenny Craw and seen last month,    PCP is Tenny Craw, Darlen Round, MD.  Allergies  Allergen Reactions   Cephalexin Other (See Comments)    Headache and nausea; Pt stated that she tolerated other abx in this class   Codeine Nausea Only   Tape Other (See Comments)    Sensitivity to bandaids and tape that cause blisters and redness    Review of Systems: Negative except as noted in the HPI.  Objective: No changes noted in today's physical examination. There were no vitals filed for this visit.  Marie Anderson is a pleasant 78 y.o. female WD, WN in NAD. AAO x 3.  Vascular Examination: Capillary refill time immediate b/l. Vascular status intact b/l with palpable pedal pulses. Pedal hair present b/l. No pain with calf compression b/l. Skin temperature gradient WNL b/l. No cyanosis or clubbing b/l. No ischemia or gangrene noted b/l.   Neurological Examination: Sensation grossly intact b/l with 10 gram monofilament. Vibratory sensation intact b/l. Pt has subjective symptoms of neuropathy. Protective sensation diminished with 10g monofilament b/l.  Dermatological Examination: Pedal skin with normal turgor, texture and tone b/l.  No open wounds. No interdigital macerations.    Toenails bilateral great toes, L 5th toe, R 2nd toe, R 3rd toe, and R 5th toe elongated, discolored, dystrophic, thickened, and crumbly with subungual debris and tenderness to dorsal palpation. Porokeratotic lesion(s) of submet head 4 left foot. No erythema, no edema, no drainage, no fluctuance.  Musculoskeletal Examination: Muscle strength 5/5 to all lower extremity muscle groups bilaterally. No pain, crepitus or joint limitation noted with ROM bilateral LE. No gross bony deformities bilaterally.  Radiographs: None  Assessment/Plan: 1. Pain due to onychomycosis of toenails of both feet   2. Porokeratosis   3. Chemotherapy-induced peripheral neuropathy (HCC)     -Consent given for treatment as described below: -Examined patient. -Continue supportive shoe gear daily. -Mycotic toenails left fifth digit, R 2nd toe, R 3rd toe, and R 5th toe were debrided in length and girth with sterile nail nippers and dremel without iatrogenic bleeding. -Porokeratotic lesion(s) submet head 4 left foot pared and enucleated with sterile currette without incident. Total number of lesions debrided=1. -Patient/POA to call should there be question/concern in the interim.   Return in about 4 months (around 08/12/2023).  Freddie Breech, DPM      [place] LOCATION: [address] [place] [address]                   Office [phone]  [place] LOCATION: [address] [place], [place] [address] Office [phone]

## 2023-04-15 ENCOUNTER — Telehealth: Payer: Self-pay | Admitting: Podiatry

## 2023-04-15 NOTE — Telephone Encounter (Signed)
Pt called and was here yesterday and thinks she may have left her insurance card here.   Upon checking with Kendel who checked her in she did have the card at the office.  Lvm for pt that the card is at the office and she can stop in to pick up at her convenience.

## 2023-04-21 DIAGNOSIS — R87612 Low grade squamous intraepithelial lesion on cytologic smear of cervix (LGSIL): Secondary | ICD-10-CM | POA: Diagnosis not present

## 2023-04-22 ENCOUNTER — Encounter: Payer: Self-pay | Admitting: Podiatry

## 2023-04-27 NOTE — Assessment & Plan Note (Signed)
 Left breast invasive ductal carcinoma ER/PR positive HER-2 negative Ki-67 94% diagnosed October 2013 status post 4 cycles of TCH Perjeta followed by left mastectomy which revealed a 0.8 cm grade 1 IDC, 1 sentinel node micrometastatic disease ER/PR 100% positive status post radiation therapy and completed adjuvant trastuzumab 01/12/2013 currently on Arimidex 1 mg daily from 07/29/2012 completed May 202   Breast cancer surveillance:  Right breast mammograms: [facility].     Basal cell carcinoma on the face: Removed 09/18/2020

## 2023-04-27 NOTE — Assessment & Plan Note (Signed)
 Labs reviewed 06/26/2020: Platelet count 44 and white count 3, ANC 1.8 . 09/18/2020: Platelets 44 WBC 3 10/22/2021: WBC 3.3, hemoglobin 13.1, platelets 34 01/26/22: WBC 3.6, hemoglobin 13, platelets 34 06/03/2022: WBC 2.8, hemoglobin 13.7, platelets 40, ANC 1.5 10/28/2022: WBC 2.9, hemoglobin 13.1, platelets 40, ANC 1.6  She would like to see Dr. Park Breed for consultation.    We will continue to watch and monitor Recheck labs in 6 months and follow-up

## 2023-04-28 ENCOUNTER — Inpatient Hospital Stay: Payer: Medicare Other | Attending: Hematology and Oncology

## 2023-04-28 ENCOUNTER — Inpatient Hospital Stay (HOSPITAL_BASED_OUTPATIENT_CLINIC_OR_DEPARTMENT_OTHER): Payer: Medicare Other | Admitting: Hematology and Oncology

## 2023-04-28 VITALS — BP 142/74 | HR 70 | Temp 98.2°F | Resp 16 | Ht 65.0 in | Wt 179.8 lb

## 2023-04-28 DIAGNOSIS — D469 Myelodysplastic syndrome, unspecified: Secondary | ICD-10-CM | POA: Diagnosis not present

## 2023-04-28 DIAGNOSIS — Z79811 Long term (current) use of aromatase inhibitors: Secondary | ICD-10-CM | POA: Insufficient documentation

## 2023-04-28 DIAGNOSIS — Z923 Personal history of irradiation: Secondary | ICD-10-CM | POA: Insufficient documentation

## 2023-04-28 DIAGNOSIS — Z170 Estrogen receptor positive status [ER+]: Secondary | ICD-10-CM

## 2023-04-28 DIAGNOSIS — Z79899 Other long term (current) drug therapy: Secondary | ICD-10-CM | POA: Diagnosis not present

## 2023-04-28 DIAGNOSIS — C50212 Malignant neoplasm of upper-inner quadrant of left female breast: Secondary | ICD-10-CM | POA: Insufficient documentation

## 2023-04-28 DIAGNOSIS — Z1721 Progesterone receptor positive status: Secondary | ICD-10-CM | POA: Diagnosis not present

## 2023-04-28 DIAGNOSIS — Z9012 Acquired absence of left breast and nipple: Secondary | ICD-10-CM | POA: Insufficient documentation

## 2023-04-28 DIAGNOSIS — Z1731 Human epidermal growth factor receptor 2 positive status: Secondary | ICD-10-CM | POA: Insufficient documentation

## 2023-04-28 DIAGNOSIS — D46Z Other myelodysplastic syndromes: Secondary | ICD-10-CM

## 2023-04-28 LAB — CBC WITH DIFFERENTIAL (CANCER CENTER ONLY)
Abs Immature Granulocytes: 0.01 10*3/uL (ref 0.00–0.07)
Basophils Absolute: 0 10*3/uL (ref 0.0–0.1)
Basophils Relative: 1 %
Eosinophils Absolute: 0 10*3/uL (ref 0.0–0.5)
Eosinophils Relative: 1 %
HCT: 40 % (ref 36.0–46.0)
Hemoglobin: 13.2 g/dL (ref 12.0–15.0)
Immature Granulocytes: 0 %
Lymphocytes Relative: 42 %
Lymphs Abs: 1.4 10*3/uL (ref 0.7–4.0)
MCH: 32.4 pg (ref 26.0–34.0)
MCHC: 33 g/dL (ref 30.0–36.0)
MCV: 98.3 fL (ref 80.0–100.0)
Monocytes Absolute: 0.3 10*3/uL (ref 0.1–1.0)
Monocytes Relative: 9 %
Neutro Abs: 1.5 10*3/uL — ABNORMAL LOW (ref 1.7–7.7)
Neutrophils Relative %: 47 %
Platelet Count: 40 10*3/uL — ABNORMAL LOW (ref 150–400)
RBC: 4.07 MIL/uL (ref 3.87–5.11)
RDW: 14.5 % (ref 11.5–15.5)
WBC Count: 3.3 10*3/uL — ABNORMAL LOW (ref 4.0–10.5)
nRBC: 0 % (ref 0.0–0.2)

## 2023-04-28 NOTE — Progress Notes (Signed)
 Patient Care Team: Daisy Floro, MD as PCP - General (Family Medicine) [place], Peter M, MD as PCP - Cardiology (Cardiology) Pollyann Savoy, MD as Consulting Physician (Rheumatology)  DIAGNOSIS:  Encounter Diagnoses  Name Primary?   Malignant neoplasm of upper-inner quadrant of left breast in female, estrogen receptor positive (HCC) Yes   MDS (myelodysplastic syndrome), low grade (HCC)     SUMMARY OF ONCOLOGIC HISTORY: Oncology History  Breast cancer of upper-inner quadrant of left female breast (HCC)  12/31/2011 - 03/11/2012 Neo-Adjuvant Chemotherapy   Taxotere Herceptin Perjeta 4 followed by Herceptin maintenance completed 01/29/2013   04/06/2012 Surgery   Left breast mastectomy: IDC grade 1; 0.8 cm, low-grade DCIS, LV I identified, 1/2 lymph node Micro met, ER/PR positive HER-2 positive Ki-67 94%   05/04/2012 - 06/20/2012 Radiation Therapy   Adjuvant radiation therapy   07/29/2012 -  Anti-estrogen oral therapy   Arimidex 1 mg daily   10/17/2014 Procedure   Bone marrow biopsy for thrombocytopenia and leukopenia: Hypercellular marrow with trilineage dysplasia most apparent in megakaryocytes with associated myelofibrosis: low-grade MDS with refractory cytopenias and multilineage dysplasia (20q- partial del)   MDS (myelodysplastic syndrome), low grade (HCC)  10/17/2014 Initial Diagnosis   MDS (myelodysplastic syndrome), low grade , refractory cytopenia with multi nature dysplasia cytogenetics -20q (associated with good prognosis MDS)     CHIEF COMPLIANT: Follow-up of MDS and history of breast cancer  HISTORY OF PRESENT ILLNESS: [name] is a 78 year old with above-mentioned history of breast cancer that was treated and has been in remission.  She also has a history of low-grade MDS for which she is on routine follow-ups.  She reports that she had an abnormal Pap test which is likely to require conization procedure and possibly might require hysterectomy down the  line.  She is worried about her risk of bleeding with thrombocytopenia.  ALLERGIES:  is allergic to cephalexin, codeine, and tape.  MEDICATIONS:  Current Outpatient Medications  Medication Sig Dispense Refill   Calcium Carbonate-Vit D-Min (CALCIUM 1200 PO) Take 1 tablet by mouth every morning.     cholecalciferol (VITAMIN D) 1000 UNITS tablet Take by mouth daily.      cyanocobalamin 100 MCG tablet Take 100 mcg by mouth daily.     hydrochlorothiazide (HYDRODIURIL) 25 MG tablet Take 1 tablet (25 mg total) by mouth daily. (Patient taking differently: Take 25 mg by mouth in the morning.) 90 tablet 3   metoprolol succinate (TOPROL-XL) 100 MG 24 hr tablet Take 1 tablet (100 mg total) by mouth daily. TAKE WITH OR IMMEDIATELY FOLLOWING A MEAL. (Patient taking differently: Take 100 mg by mouth at bedtime. TAKE WITH OR IMMEDIATELY FOLLOWING A MEAL.) 90 tablet 3   vitamin E 200 UNIT capsule Take 200 Units by mouth every morning.      zolpidem (AMBIEN CR) 12.5 MG CR tablet Take 1 tablet (12.5 mg total) by mouth at bedtime as needed. for sleep 30 tablet 3   Acetaminophen (TYLENOL PO) Take 1 tablet by mouth daily as needed (Migraine). (Patient not taking: Reported on 04/28/2023)     No current facility-administered medications for this visit.    PHYSICAL EXAMINATION: ECOG PERFORMANCE STATUS: 1 - Symptomatic but completely ambulatory  Vitals:   04/28/23 1029  BP: (!) 142/74  Pulse: 70  Resp: 16  Temp: 98.2 F (36.8 C)  SpO2: 100%   Filed Weights   04/28/23 1029  Weight: 179 lb 12.8 oz (81.6 kg)      LABORATORY DATA:  I  have reviewed the data as listed    Latest Ref Rng & Units 02/22/2023    4:48 AM 02/21/2023   11:13 AM 01/26/2022    9:17 AM  CMP  Glucose 70 - 99 mg/dL 90  161  93   BUN 8 - 23 mg/dL 18  33  13   Creatinine 0.44 - 1.00 mg/dL 0.96  0.45  4.09   Sodium 135 - 145 mmol/L 139  139  141   Potassium 3.5 - 5.1 mmol/L 3.6  3.5  3.7   Chloride 98 - 111 mmol/L 111  103  105    CO2 22 - 32 mmol/L 22  27  31    Calcium 8.9 - 10.3 mg/dL 7.4  8.3  9.1   Total Protein 6.5 - 8.1 g/dL  6.7  7.1   Total Bilirubin <1.2 mg/dL  1.1  0.8   Alkaline Phos 38 - 126 U/L  42  56   AST 15 - 41 U/L  30  18   ALT 0 - 44 U/L  18  11     Lab Results  Component Value Date   WBC 3.3 (L) 04/28/2023   HGB 13.2 04/28/2023   HCT 40.0 04/28/2023   MCV 98.3 04/28/2023   PLT 40 (L) 04/28/2023   NEUTROABS 1.5 (L) 04/28/2023    ASSESSMENT & PLAN:  Breast cancer of upper-inner quadrant of left female breast (HCC) Left breast invasive ductal carcinoma ER/PR positive HER-2 negative Ki-67 94% diagnosed October 2013 status post 4 cycles of TCH Perjeta followed by left mastectomy which revealed a 0.8 cm grade 1 IDC, 1 sentinel node micrometastatic disease ER/PR 100% positive status post radiation therapy and completed adjuvant trastuzumab 01/12/2013 currently on Arimidex 1 mg daily from 07/29/2012 completed May 202   Breast cancer surveillance:  Right breast mammograms: [facility].     Basal cell carcinoma on the face: Removed 09/18/2020  MDS (myelodysplastic syndrome), low grade Labs reviewed 06/26/2020: Platelet count 44 and white count 3, ANC 1.8 . 09/18/2020: Platelets 44 WBC 3 10/22/2021: WBC 3.3, hemoglobin 13.1, platelets 34 01/26/22: WBC 3.6, hemoglobin 13, platelets 34 06/03/2022: WBC 2.8, hemoglobin 13.7, platelets 40, ANC 1.5 10/28/2022: WBC 2.9, hemoglobin 13.1, platelets 40, ANC 1.6 04/28/2023: WBC 3.3, hemoglobin 13.2, platelets 40    She would like to see Dr. Park Breed for consultation.    We will continue to watch and monitor Recheck labs in 6 months and follow-up Abnormal Pap test: If she were to require surgery like hysterectomy and BSO, then we will give platelet transfusion before and after surgery.  If she cannot get an appointment to see Dr. Park Breed she would like to come back and see [place] in 6 months.   No orders of the defined types were placed in this  encounter.  The patient has a good understanding of the overall plan. she agrees with it. she will call with any problems that may develop before the next visit here. Total time spent: 30 mins including face to face time and time spent for planning, charting and co-ordination of care   Tamsen Meek, MD 04/28/23

## 2023-04-30 ENCOUNTER — Ambulatory Visit: Payer: Medicare Other | Admitting: Hematology and Oncology

## 2023-04-30 ENCOUNTER — Other Ambulatory Visit: Payer: Medicare Other

## 2023-05-11 ENCOUNTER — Other Ambulatory Visit: Payer: Self-pay | Admitting: Hematology and Oncology

## 2023-05-11 DIAGNOSIS — F5101 Primary insomnia: Secondary | ICD-10-CM

## 2023-05-11 DIAGNOSIS — D46Z Other myelodysplastic syndromes: Secondary | ICD-10-CM

## 2023-05-27 DIAGNOSIS — K529 Noninfective gastroenteritis and colitis, unspecified: Secondary | ICD-10-CM | POA: Diagnosis not present

## 2023-05-30 DIAGNOSIS — J309 Allergic rhinitis, unspecified: Secondary | ICD-10-CM | POA: Diagnosis not present

## 2023-06-09 ENCOUNTER — Other Ambulatory Visit: Payer: Self-pay | Admitting: Physician Assistant

## 2023-06-09 DIAGNOSIS — I471 Supraventricular tachycardia, unspecified: Secondary | ICD-10-CM

## 2023-07-19 NOTE — Progress Notes (Signed)
 Cardiology Office Note:    Date:  07/22/2023   ID:  Marie Anderson, DOB 10-25-1945, MRN [id number]  PCP:  Jimmey Mould, MD [facility] Cardiologist: Aimi Essner [place], MD   Reason for visit: 1 year follow-up  History of Present Illness:    Marie Anderson is a 78 y.o. female with a hx of SVT, breast cancer status post left mastectomy and reconstruction, myelodysplasia syndrome with thrombocytopenia and neutropenia (aspirin discontinued), Sjogren's syndrome and antiphospholipid syndrome.  SVT required adenosine  in 2013.  SVT in 2015 responded to Valsalva maneuver.  SVT in 2018 converted with adenosine .  She was seen in April 2023.  She was feeling well.  She described few episodes of SVT limited by coughing.  She had 1 prolonged episode that resolved by an extra metoprolol  tablet. Last seen in May 2024 and doing well.   She was admitted in Dec 2024 with CAP and AKI. She had a retinal tear in November. States she has had norovirus twice. SVT is well controlled.       Past Medical History:  Diagnosis Date   Anemia    Antiphospholipid antibody syndrome (HCC)    Breast cancer (HCC) 12/04/11   left- inv ductal ca, DCIS, ER/PR +, HER 2 +   Family history of breast cancer    Family history of ovarian cancer    Family history of pancreatic cancer    History of chemotherapy 12/31/11 -03/11/12   s/p 4 cycles   History of radiation therapy 05/04/12-06/20/12   left breast/   Myelodysplasia    Neuropathy    PONV (postoperative nausea and vomiting)    Skin cancer    Melanoma at 60   SVT (supraventricular tachycardia) (HCC)     Past Surgical History:  Procedure Laterality Date   AUGMENTATION MAMMAPLASTY Bilateral    2014 with reduction on right   BREAST RECONSTRUCTION WITH PLACEMENT OF TISSUE EXPANDER AND FLEX HD (ACELLULAR HYDRATED DERMIS)  04/06/2012   Procedure: BREAST RECONSTRUCTION WITH PLACEMENT OF TISSUE EXPANDER AND FLEX HD (ACELLULAR HYDRATED DERMIS);  Surgeon: Marilou Showman, DO;  Location: [facility];  Service: Plastics;  Laterality: Left;  IMMEDIATE LEFT BREAST RECONSTRUCTION WITH PLACEMENT OF TISSUE EXPANDER AND ALLODERM   COLONOSCOPY     DILATION AND CURETTAGE OF UTERUS     EYE SURGERY     age 68-lt eye growth   LIPOSUCTION WITH LIPOFILLING N/A 01/19/2013   Procedure: LIPOSUCTION WITH LIPOFILLING;  Surgeon: Marilou Showman, DO;  Location: [facility];  Service: Plastics;  Laterality: N/A;   MASTECTOMY Left    2014   MASTECTOMY W/ SENTINEL NODE BIOPSY  04/06/2012   Procedure: MASTECTOMY WITH SENTINEL LYMPH NODE BIOPSY;  Surgeon: Enid Harry, MD;  Location: [facility];  Service: General;  Laterality: Left;  left mastectomy, left axillary sentinel node biopsy   MASTOPEXY Right 01/19/2013   Procedure: RIGHT BREAST PLACEMENT OF IMPLANT WITH MASTOPEXY;  Surgeon: Marilou Showman, DO;  Location: [facility];  Service: Plastics;  Laterality: Right;   PORT-A-CATH REMOVAL Right 01/19/2013   Procedure: REMOVAL PORT-A-CATH;  Surgeon: Enid Harry, MD;  Location: [facility];  Service: General;  Laterality: Right;   PORTACATH PLACEMENT  12/23/2011   Procedure: INSERTION PORT-A-CATH;  Surgeon: Enid Harry, MD;  Location: [facility];  Service: General;  Laterality: Right;   REMOVAL OF TISSUE EXPANDER AND PLACEMENT OF IMPLANT Left 01/19/2013   Procedure: REMOVAL OF LEFT TISSUE EXPANDERS WITH  PLACEMENT OF LEFT BREAST IMPLANT;  Surgeon: Marilou Showman, DO;  Location: [facility];  Service: Plastics;  Laterality: Left;    Current Medications: Current Meds  Medication Sig   Calcium Carbonate-Vit D-Min (CALCIUM 1200 PO) Take 1 tablet by mouth every morning.   cholecalciferol (VITAMIN D ) 1000 UNITS tablet Take by mouth daily.    cyanocobalamin 100 MCG tablet Take 100 mcg by mouth daily.   vitamin E 200 UNIT capsule Take 200 Units by mouth every morning.     zolpidem  (AMBIEN  CR) 12.5 MG CR tablet TAKE 1 TABLET BY MOUTH AT BEDTIME AS NEEDED FOR SLEEP   [DISCONTINUED] hydrochlorothiazide  (HYDRODIURIL ) 25 MG tablet Take 1 tablet (25 mg total) by mouth daily. Please call [phone] to schedule an appointment for future refills. Thank you.   [DISCONTINUED] metoprolol  succinate (TOPROL -XL) 100 MG 24 hr tablet Take 1 tablet (100 mg total) by mouth daily. TAKE WITH OR IMMEDIATELY FOLLOWING A MEAL. Please call [phone] to schedule an appointment for future refills. Thank you.     Allergies:   Cephalexin, Codeine, and Tape   Social History   Socioeconomic History   Marital status: Single    Spouse name: Not on file   Number of children: 2   Years of education: Not on file   Highest education level: Not on file  Occupational History   Occupation: Engineer, drilling    Comment: retired   Occupation: front office     Comment: [employer]  Tobacco Use   Smoking status: Never   Smokeless tobacco: Never  Substance and Sexual Activity   Alcohol use: Yes   Drug use: No   Sexual activity: Yes  Other Topics Concern   Not on file  Social History Narrative   Not on file   Social Drivers of Health   Financial Resource Strain: Not on file  Food Insecurity: No Food Insecurity (02/21/2023)   Hunger Vital Sign    Worried About Running Out of Food in the Last Year: Never true    Ran Out of Food in the Last Year: Never true  Transportation Needs: No Transportation Needs (02/21/2023)   PRAPARE - Administrator, Civil Service (Medical): No    Lack of Transportation (Non-Medical): No  Physical Activity: Not on file  Stress: Not on file  Social Connections: Not on file     Family History: The patient's family history includes Cancer in her cousin, father, and paternal uncle; Heart attack in her maternal grandfather; Kidney cancer in her maternal uncle; Ovarian cancer in her cousin; Pancreatic cancer (age of onset: 98) in her  cousin; Pancreatic cancer (age of onset: 61) in her cousin; Stroke in her maternal grandmother and mother.  ROS:   Please see the history of present illness.     EKGs/Labs/Other Studies Reviewed:    EKG:  The ekg ordered today demonstrates normal sinus rhythm with premature atrial complexes, nonspecific ST and T wave abnormality. HR 75.  Recent Labs: 02/21/2023: ALT 18; Magnesium 1.9; TSH 2.416 02/22/2023: BUN 18; Creatinine, Ser 0.72; Potassium 3.6; Sodium 139 04/28/2023: Hemoglobin 13.2; Platelet Count 40   Recent Lipid Panel No results found for: "CHOL", "TRIG", "HDL", "LDLCALC", "LDLDIRECT"  Physical Exam:    VS:  BP 122/80 (BP Location: Right Arm, Patient Position: Sitting, Cuff Size: Normal)   Ht 5\' 4"  (1.626 m)   Wt 182 lb (82.6 kg)   SpO2 100%   BMI 31.24 kg/m    No data  found.   Wt Readings from Last 3 Encounters:  07/22/23 182 lb (82.6 kg)  04/28/23 179 lb 12.8 oz (81.6 kg)  04/14/23 179 lb (81.2 kg)     GEN:  Well nourished, well developed in no acute distress HEENT: Normal NECK: No JVD; No carotid bruits CARDIAC: RRR, no murmurs, rubs, gallops RESPIRATORY:  Clear to auscultation without rales, wheezing or rhonchi  ABDOMEN: Soft, non-tender, non-distended MUSCULOSKELETAL: No edema SKIN: Warm and dry NEUROLOGIC:  Alert and oriented PSYCHIATRIC:  Normal affect    ASSESSMENT AND PLAN   Supraventricular tachycardia, rare symptoms -Well-controlled with Toprol  XL 100 mg nightly -refilled today.  Hypertension, well-controlled -Refilled her HCTZ 25 mg daily.   -BP is well controlled.   Disposition - Follow-up in 1 year.   Medication Adjustments/Labs and Tests Ordered: Current medicines are reviewed at length with the patient today.  Concerns regarding medicines are outlined above.  No orders of the defined types were placed in this encounter.  Meds ordered this encounter  Medications   hydrochlorothiazide  (HYDRODIURIL ) 25 MG tablet    Sig: Take 1 tablet  (25 mg total) by mouth daily. Please call [phone] to schedule an appointment for future refills. Thank you.    Dispense:  90 tablet    Refill:  3    Appointment needed for additional refills. Thank you.   metoprolol  succinate (TOPROL -XL) 100 MG 24 hr tablet    Sig: Take 1 tablet (100 mg total) by mouth daily. TAKE WITH OR IMMEDIATELY FOLLOWING A MEAL. Please call [phone] to schedule an appointment for future refills. Thank you.    Dispense:  90 tablet    Refill:  3    Appointment needed for additional refills. Thank you.    There are no Patient Instructions on file for this visit.   Signed, Dagon Budai [place], MD  07/22/2023 9:36 AM    [facility]

## 2023-07-22 ENCOUNTER — Ambulatory Visit: Attending: Cardiology | Admitting: Cardiology

## 2023-07-22 VITALS — BP 122/80 | Ht 64.0 in | Wt 182.0 lb

## 2023-07-22 DIAGNOSIS — I471 Supraventricular tachycardia, unspecified: Secondary | ICD-10-CM | POA: Insufficient documentation

## 2023-07-22 DIAGNOSIS — I10 Essential (primary) hypertension: Secondary | ICD-10-CM | POA: Diagnosis not present

## 2023-07-22 MED ORDER — HYDROCHLOROTHIAZIDE 25 MG PO TABS: 25.0000 mg | ORAL_TABLET | Freq: Every day | ORAL | 3 refills | Status: AC

## 2023-07-22 MED ORDER — METOPROLOL SUCCINATE ER 100 MG PO TB24: 100.0000 mg | ORAL_TABLET | Freq: Every day | ORAL | 3 refills | Status: AC

## 2023-07-22 NOTE — Patient Instructions (Signed)

## 2023-07-23 ENCOUNTER — Ambulatory Visit (INDEPENDENT_AMBULATORY_CARE_PROVIDER_SITE_OTHER): Payer: Medicare Other | Admitting: Podiatry

## 2023-07-23 DIAGNOSIS — M79674 Pain in right toe(s): Secondary | ICD-10-CM

## 2023-07-23 DIAGNOSIS — G620 Drug-induced polyneuropathy: Secondary | ICD-10-CM | POA: Diagnosis not present

## 2023-07-23 DIAGNOSIS — M79675 Pain in left toe(s): Secondary | ICD-10-CM | POA: Diagnosis not present

## 2023-07-23 DIAGNOSIS — B351 Tinea unguium: Secondary | ICD-10-CM | POA: Diagnosis not present

## 2023-07-23 DIAGNOSIS — Q828 Other specified congenital malformations of skin: Secondary | ICD-10-CM

## 2023-07-27 ENCOUNTER — Encounter: Payer: Self-pay | Admitting: Podiatry

## 2023-07-27 NOTE — Progress Notes (Addendum)
 Subjective:  Patient ID: Marie Anderson, female    DOB: 09-04-1945,  MRN: [id number]  Late entry for chart correction. Patient does not have dementia.  Marie Anderson presents to clinic today for at risk foot care with h/o neuropathy secondary to chemotherapy and painful porokeratotic lesion(s) left lower extremity and painful mycotic toenails that limit ambulation. Painful toenails interfere with ambulation. Aggravating factors include wearing enclosed shoe gear. Pain is relieved with periodic professional debridement. Painful porokeratotic lesions are aggravated when weightbearing with and without shoegear. Pain is relieved with periodic professional debridement.  Chief Complaint  Patient presents with   Nail Problem    RFC   New problem(s): None.   PCP is Okey Carlin Redbird, MD.LOV 03/04/2023.  Allergies  Allergen Reactions   Cephalexin Other (See Comments)    Headache and nausea; Pt stated that she tolerated other abx in this class   Codeine Nausea Only   Tape Other (See Comments)    Sensitivity to bandaids and tape that cause blisters and redness    Review of Systems: Negative except as noted in the HPI.  Objective: No changes noted in today's physical examination. There were no vitals filed for this visit. Marie Anderson is a pleasant 78 y.o. female in NAD. AAO x 3.  Vascular Examination: Capillary refill time immediate b/l. Vascular status intact b/l with palpable pedal pulses. Pedal hair present b/l. No pain with calf compression b/l. Skin temperature gradient WNL b/l. No cyanosis or clubbing b/l. No ischemia or gangrene noted b/l.   Neurological Examination: Sensation grossly intact b/l with 10 gram monofilament. Vibratory sensation intact b/l. Pt has subjective symptoms of neuropathy. Protective sensation diminished with 10g monofilament b/l.  Dermatological Examination: Pedal skin with normal turgor, texture and tone b/l.  No open wounds. No interdigital  macerations.   Toenails bilateral great toes, L 5th toe, R 2nd toe, R 3rd toe, and R 5th toe elongated, discolored, dystrophic, thickened, and crumbly with subungual debris and tenderness to dorsal palpation. Porokeratotic lesion(s) of submet head 5 left foot. No erythema, no edema, no drainage, no fluctuance.  Musculoskeletal Examination: Muscle strength 5/5 to all lower extremity muscle groups bilaterally. No pain, crepitus or joint limitation noted with ROM bilateral LE. No gross bony deformities bilaterally.  Radiographs: None Assessment/Plan: 1. Pain due to onychomycosis of toenails of both feet   2. Porokeratosis   3. Chemotherapy-induced peripheral neuropathy (HCC)     -Patient was evaluated today. All questions/concerns addressed on today's visit. -Patient to continue soft, supportive shoe gear daily. -Toenails L 5th toe, R 2nd toe, R 3rd toe, and R 5th toe debrided in length and girth without iatrogenic bleeding with sterile nail nipper and dremel.  -Porokeratotic lesion(s) submet head 5 left foot pared and enucleated with sterile currette without incident. Total number of lesions debrided=1. -Patient/POA to call should there be question/concern in the interim.   Return in about 3 months (around 10/23/2023).  Delon LITTIE Merlin, DPM      [place] LOCATION: [address], [place] [address]                   Office [phone]   [place] LOCATION: [address]  [place], [place] [address] Office [phone]

## 2023-09-06 ENCOUNTER — Other Ambulatory Visit: Payer: Self-pay | Admitting: *Deleted

## 2023-09-06 DIAGNOSIS — F5101 Primary insomnia: Secondary | ICD-10-CM

## 2023-09-06 DIAGNOSIS — D46Z Other myelodysplastic syndromes: Secondary | ICD-10-CM

## 2023-09-06 MED ORDER — ZOLPIDEM TARTRATE ER 12.5 MG PO TBCR: 12.5000 mg | EXTENDED_RELEASE_TABLET | Freq: Every evening | ORAL | 3 refills | Status: DC | PRN

## 2023-09-07 ENCOUNTER — Other Ambulatory Visit: Payer: Self-pay | Admitting: Hematology and Oncology

## 2023-09-07 DIAGNOSIS — D46Z Other myelodysplastic syndromes: Secondary | ICD-10-CM

## 2023-09-07 DIAGNOSIS — F5101 Primary insomnia: Secondary | ICD-10-CM

## 2023-09-07 MED ORDER — ZOLPIDEM TARTRATE ER 12.5 MG PO TBCR: 12.5000 mg | EXTENDED_RELEASE_TABLET | Freq: Every evening | ORAL | 3 refills | Status: DC | PRN

## 2023-10-08 ENCOUNTER — Other Ambulatory Visit: Payer: Self-pay

## 2023-10-08 ENCOUNTER — Encounter (HOSPITAL_BASED_OUTPATIENT_CLINIC_OR_DEPARTMENT_OTHER): Payer: Self-pay

## 2023-10-08 ENCOUNTER — Emergency Department (HOSPITAL_BASED_OUTPATIENT_CLINIC_OR_DEPARTMENT_OTHER)

## 2023-10-08 ENCOUNTER — Emergency Department (HOSPITAL_BASED_OUTPATIENT_CLINIC_OR_DEPARTMENT_OTHER)
Admission: EM | Admit: 2023-10-08 | Discharge: 2023-10-08 | Disposition: A | Discharge status: Home / Self Care | Source: Ambulatory Visit | Attending: Emergency Medicine | Admitting: Emergency Medicine

## 2023-10-08 DIAGNOSIS — M542 Cervicalgia: Secondary | ICD-10-CM | POA: Insufficient documentation

## 2023-10-08 DIAGNOSIS — W1830XA Fall on same level, unspecified, initial encounter: Secondary | ICD-10-CM | POA: Insufficient documentation

## 2023-10-08 DIAGNOSIS — M47812 Spondylosis without myelopathy or radiculopathy, cervical region: Secondary | ICD-10-CM | POA: Diagnosis not present

## 2023-10-08 DIAGNOSIS — S0990XA Unspecified injury of head, initial encounter: Secondary | ICD-10-CM | POA: Diagnosis not present

## 2023-10-08 DIAGNOSIS — W19XXXA Unspecified fall, initial encounter: Secondary | ICD-10-CM | POA: Diagnosis not present

## 2023-10-08 DIAGNOSIS — Y93K1 Activity, walking an animal: Secondary | ICD-10-CM | POA: Diagnosis not present

## 2023-10-08 DIAGNOSIS — S0083XA Contusion of other part of head, initial encounter: Secondary | ICD-10-CM | POA: Diagnosis not present

## 2023-10-08 DIAGNOSIS — R519 Headache, unspecified: Secondary | ICD-10-CM | POA: Diagnosis present

## 2023-10-08 DIAGNOSIS — M79661 Pain in right lower leg: Secondary | ICD-10-CM | POA: Insufficient documentation

## 2023-10-08 DIAGNOSIS — M25571 Pain in right ankle and joints of right foot: Secondary | ICD-10-CM | POA: Diagnosis not present

## 2023-10-08 DIAGNOSIS — S82831A Other fracture of upper and lower end of right fibula, initial encounter for closed fracture: Secondary | ICD-10-CM | POA: Diagnosis not present

## 2023-10-08 NOTE — ED Triage Notes (Addendum)
 Fall while walking the dog. Hit head. PMH MDS - myelodysplasia syndrome. Reports chronically low platelets. Was seen at [facility] and treated for the right knee/right ankle and sent her for CT.

## 2023-10-08 NOTE — ED Provider Notes (Signed)
 [place] EMERGENCY DEPARTMENT AT [facility] Provider Note   CSN: [id number] Arrival date & time: 10/08/23  1526     Patient presents with: Marie Anderson   TESSLA SPURLING is a 78 y.o. female history of myelodysplastic syndrome with chronic thrombocytopenia here for evaluation of mechanical fall.  Walking her dog yesterday subsequently fell backwards and hit her head.  She denies any syncope.  She was seen by urgent care was diagnosed with a distal fibular fracture on the right foot however due to her head injury and thrombocytopenia recommend come here for evaluation.  She has a mild aching headache and some bilateral neck tightness.  No midline neck pain.  No numbness, weakness, vision changes.  She has small hematoma to the posterior aspect of her head.   HPI     Prior to Admission medications   Medication Sig Start Date End Date Taking? Authorizing Provider  Acetaminophen  (TYLENOL  PO) Take 1 tablet by mouth daily as needed (Migraine). Patient not taking: Reported on 04/28/2023    [provider]  Calcium Carbonate-Vit D-Min (CALCIUM 1200 PO) Take 1 tablet by mouth every morning.    [provider]  cholecalciferol (VITAMIN D ) 1000 UNITS tablet Take by mouth daily.     [provider]  cyanocobalamin 100 MCG tablet Take 100 mcg by mouth daily.    [provider]  hydrochlorothiazide  (HYDRODIURIL ) 25 MG tablet Take 1 tablet (25 mg total) by mouth daily. Please call [phone] to schedule an appointment for future refills. Thank you. 07/22/23   [place], Peter M, MD  metoprolol  succinate (TOPROL -XL) 100 MG 24 hr tablet Take 1 tablet (100 mg total) by mouth daily. TAKE WITH OR IMMEDIATELY FOLLOWING A MEAL. Please call [phone] to schedule an appointment for future refills. Thank you. 07/22/23   [place], Peter M, MD  vitamin E 200 UNIT capsule Take 200 Units by mouth every morning.     [provider]  zolpidem  (AMBIEN  CR) 12.5 MG CR tablet  Take 1 tablet (12.5 mg total) by mouth at bedtime as needed. for sleep 09/07/23   Gudena, Vinay, MD    Allergies: Cephalexin, Codeine, and Tape    Review of Systems  HENT: Negative.    Respiratory: Negative.    Cardiovascular: Negative.   Gastrointestinal: Negative.   Genitourinary: Negative.   Musculoskeletal:  Positive for neck pain.  Skin: Negative.   Neurological:  Positive for headaches (head pain). Negative for dizziness, tremors, seizures, syncope, facial asymmetry, speech difficulty, weakness and numbness.  All other systems reviewed and are negative.   Updated Vital Signs BP 136/79 (BP Location: Right Arm)   Pulse 72   Temp 98.9 F (37.2 C) (Temporal)   Resp 16   Ht 5' 5 (1.651 m)   Wt 81.6 kg   SpO2 97%   BMI 29.95 kg/m   Physical Exam Vitals and nursing note reviewed.  Constitutional:      General: She is not in acute distress.    Appearance: She is well-developed. She is not ill-appearing.  HENT:     Head: Normocephalic.     Comments: 2 cm faint hematoma central parietal region.  No lacerations or abrasions Eyes:     Pupils: Pupils are equal, round, and reactive to light.  Neck:     Comments: No midline cervical tenderness.  Tenderness bilateral trapezius. Cardiovascular:     Rate and Rhythm: Normal rate.     Pulses:          Radial  pulses are 2+ on the right side and 2+ on the left side.       Dorsalis pedis pulses are 2+ on the right side and 2+ on the left side.  Pulmonary:     Effort: No respiratory distress.  Abdominal:     General: There is no distension.  Musculoskeletal:        General: Normal range of motion.     Cervical back: Normal range of motion.     Comments: No midline C/T/L tenderness.  Nontender bilateral upper extremities.  Tenderness right distal fibula, in walking boot from urgent care.  Wiggles toes without difficulty  Skin:    General: Skin is warm and dry.  Neurological:     General: No focal deficit present.     Mental  Status: She is alert.  Psychiatric:        Mood and Affect: Mood normal.     (all labs ordered are listed, but only abnormal results are displayed) Labs Reviewed - No data to display  EKG: None  Radiology: CT Cervical Spine Wo Contrast Result Date: 10/08/2023 EXAM: CT CERVICAL SPINE WITHOUT CONTRAST 10/08/2023 04:44:48 PM TECHNIQUE: CT of the cervical spine was performed without the administration of intravenous contrast. Multiplanar reformatted images are provided for review. Automated exposure control, iterative reconstruction, and/or weight based adjustment of the mA/kV was utilized to reduce the radiation dose to as low as reasonably achievable. COMPARISON: None available. CLINICAL HISTORY: Fall while walking the dog. Hit head. PMH MDS - myelodysplasia syndrome. Reports chronically low platelets. FINDINGS: CERVICAL SPINE: BONES AND ALIGNMENT: Straightening of the normal cervical lordosis. No spondylolisthesis. No facet subluxation or dislocation. No compression fracture or displaced fracture in the cervical spine. No suspicious osseous lesion. DEGENERATIVE CHANGES: Mild degenerative endplate osteophytes at multiple levels. No high-grade osseous spinal canal stenosis. No high-grade osseous foraminal stenosis. SOFT TISSUES: No prevertebral soft tissue swelling. IMPRESSION: 1. No acute fracture or traumatic malalignment of the cervical spine. Electronically signed by: Donnice Mania MD 10/08/2023 05:29 PM EDT RP Workstation: [id number]   CT Head Wo Contrast Result Date: 10/08/2023 EXAM: CT HEAD WITHOUT CONTRAST 10/08/2023 04:44:48 PM TECHNIQUE: CT of the head was performed without the administration of intravenous contrast. Automated exposure control, iterative reconstruction, and/or weight based adjustment of the mA/kV was utilized to reduce the radiation dose to as low as reasonably achievable. COMPARISON: None available. CLINICAL HISTORY: Head trauma, moderate-severe. Fall while walking the dog. Hit  head. PMH MDS - myelodysplasia syndrome. Reports chronically low platelets. FINDINGS: BRAIN AND VENTRICLES: No acute hemorrhage. Gray-white differentiation is preserved. No hydrocephalus. No extra-axial collection. No mass effect or midline shift. Nonspecific hypoattenuation in the periventricular and subcortical white matter, most likely representing chronic small vessel disease. ORBITS: No acute abnormality. SINUSES: Mucosal thickening in the inferior aspect of the right maxillary sinus. SOFT TISSUES AND SKULL: No acute soft tissue abnormality. No skull fracture. IMPRESSION: 1. No acute intracranial abnormality. 2. Mild chronic small vessel disease. Electronically signed by: Donnice Mania MD 10/08/2023 04:52 PM EDT RP Workstation: [id number]     Procedures   Medications Ordered in the ED - No data to display 78 year old here for eval for mechanical fall which occurred yesterday walking her dog.  Diagnosed with a distal fibular fracture however she did hit the posterior aspect of her head concern given her chronic thrombocytopenia for myelodysplastic syndrome they recommended coming here for CT scan.  She does have a small less than 2 cm hematoma.  No breaks in skin.  She has no midline C/T/L tenderness.  Distal fibular fracture with a walking boot.  Her compartments are soft.  She is neurovascularly intact.  Will plan on CT imaging  Imaging personally viewed and interpreted:  CT head, cervical without significant traumatic abnormality  Discussed results with patient.  Will have her follow-up outpatient, return for any worsening symptoms  Reviewed urgent care read on x-ray and provider note--- cannot view x-ray performed at urgent care  The patient has been appropriately medically screened and/or stabilized in the ED. I have low suspicion for any other emergent medical condition which would require further screening, evaluation or treatment in the ED or require inpatient management.  Patient is  hemodynamically stable and in no acute distress.  Patient able to ambulate in department prior to ED.  Evaluation does not show acute pathology that would require ongoing or additional emergent interventions while in the emergency department or further inpatient treatment.  I have discussed the diagnosis with the patient and answered all questions.  Pain is been managed while in the emergency department and patient has no further complaints prior to discharge.  Patient is comfortable with plan discussed in room and is stable for discharge at this time.  I have discussed strict return precautions for returning to the emergency department.  Patient was encouraged to follow-up with PCP/specialist refer to at discharge.                                    Medical Decision Making Amount and/or Complexity of Data Reviewed External Data Reviewed: labs, radiology and notes. Radiology: ordered and independent interpretation performed. Decision-making details documented in ED Course.  Risk OTC drugs. Decision regarding hospitalization. Diagnosis or treatment significantly limited by social determinants of health.        Final diagnoses:  Fall, initial encounter    ED Discharge Orders     None          Eyad Rochford A, PA-C 10/08/23 1750    Dasie Faden, MD 10/11/23 [phone]

## 2023-10-08 NOTE — Discharge Instructions (Signed)
 Workup today is reassuring.  Follow-up with orthopedics per urgent care's recommendations  Return for any worsening symptoms

## 2023-10-11 DIAGNOSIS — D696 Thrombocytopenia, unspecified: Secondary | ICD-10-CM | POA: Insufficient documentation

## 2023-10-11 DIAGNOSIS — S8263XA Displaced fracture of lateral malleolus of unspecified fibula, initial encounter for closed fracture: Secondary | ICD-10-CM | POA: Insufficient documentation

## 2023-10-11 DIAGNOSIS — S8261XA Displaced fracture of lateral malleolus of right fibula, initial encounter for closed fracture: Secondary | ICD-10-CM | POA: Diagnosis not present

## 2023-10-13 ENCOUNTER — Ambulatory Visit (INDEPENDENT_AMBULATORY_CARE_PROVIDER_SITE_OTHER): Payer: Medicare Other | Admitting: Podiatry

## 2023-10-13 ENCOUNTER — Encounter: Payer: Self-pay | Admitting: Podiatry

## 2023-10-13 DIAGNOSIS — G620 Drug-induced polyneuropathy: Secondary | ICD-10-CM | POA: Diagnosis not present

## 2023-10-13 DIAGNOSIS — M79674 Pain in right toe(s): Secondary | ICD-10-CM

## 2023-10-13 DIAGNOSIS — Q828 Other specified congenital malformations of skin: Secondary | ICD-10-CM

## 2023-10-13 DIAGNOSIS — B351 Tinea unguium: Secondary | ICD-10-CM | POA: Diagnosis not present

## 2023-10-13 DIAGNOSIS — M79675 Pain in left toe(s): Secondary | ICD-10-CM

## 2023-10-18 DIAGNOSIS — J329 Chronic sinusitis, unspecified: Secondary | ICD-10-CM | POA: Diagnosis not present

## 2023-10-18 NOTE — Progress Notes (Signed)
  Subjective:  Patient ID: Marie Anderson, female    DOB: 1945/11/27,  MRN: [id number]  Marie Anderson presents to clinic today for at risk foot care with h/o neuropathy secondary to chemotherapy and painful porokeratotic lesion(s) left foot and painful mycotic toenails that limit ambulation. Painful toenails interfere with ambulation. Aggravating factors include wearing enclosed shoe gear. Pain is relieved with periodic professional debridement. Painful porokeratotic lesions are aggravated when weightbearing with and without shoegear. Pain is relieved with periodic professional debridement.  Chief Complaint  Patient presents with   RFC    RFC Non diabetic toenail trim. LOV with PCP 03/04/23.   New problem(s): None.   PCP is Okey Carlin Redbird, MD.  Allergies  Allergen Reactions   Cephalexin Other (See Comments)    Headache and nausea; Pt stated that she tolerated other abx in this class   Codeine Nausea Only   Tape Other (See Comments)    Sensitivity to bandaids and tape that cause blisters and redness    Review of Systems: Negative except as noted in the HPI.  Objective:  There were no vitals filed for this visit. Marie Anderson is a pleasant 78 y.o. female in NAD. AAO x 3.  Vascular Examination: Capillary refill time immediate b/l. Vascular status intact b/l with palpable pedal pulses. Pedal hair present b/l. No pain with calf compression b/l. Skin temperature gradient WNL b/l. No cyanosis or clubbing b/l. No ischemia or gangrene noted b/l.   Neurological Examination: Pt has subjective symptoms of neuropathy. Protective sensation diminished with 10g monofilament b/l.  Dermatological Examination: Pedal skin with normal turgor, texture and tone b/l.  No open wounds. No interdigital macerations.   Toenails bilateral great toes, L 5th toe, R 2nd toe, R 3rd toe, and R 5th toe elongated, discolored, dystrophic, thickened, and crumbly with subungual debris and tenderness to dorsal  palpation. Porokeratotic lesion(s) of submet head 5 left foot. No erythema, no edema, no drainage, no fluctuance.  Musculoskeletal Examination: Muscle strength 5/5 to all lower extremity muscle groups bilaterally. No pain, crepitus or joint limitation noted with ROM bilateral LE. No gross bony deformities bilaterally.  Radiographs: None  Assessment/Plan: 1. Pain due to onychomycosis of toenails of both feet   2. Porokeratosis   3. Chemotherapy-induced peripheral neuropathy (HCC)    -Consent given for treatment as described below: -Examined patient. -Toenails were debrided in length and girth bilateral great toes, bilateral 5th toes, right second digit, and right third digit with sterile nail nippers and dremel without iatrogenic bleeding.  -Porokeratotic lesion(s) submet head 5 left foot pared and enucleated with sterile currette without incident. Total number of lesions debrided=1. -Patient/POA to call should there be question/concern in the interim.   Return in about 3 months (around 01/13/2024).  Delon LITTIE Merlin, DPM      [place] LOCATION: [address] [place] [address]                   Office [phone]   [facility] LOCATION: [address] [place], [place] [address] Office [phone]

## 2023-10-20 DIAGNOSIS — Z85828 Personal history of other malignant neoplasm of skin: Secondary | ICD-10-CM | POA: Diagnosis not present

## 2023-10-20 DIAGNOSIS — D2230 Melanocytic nevi of unspecified part of face: Secondary | ICD-10-CM | POA: Diagnosis not present

## 2023-10-20 DIAGNOSIS — D2271 Melanocytic nevi of right lower limb, including hip: Secondary | ICD-10-CM | POA: Diagnosis not present

## 2023-10-20 DIAGNOSIS — L570 Actinic keratosis: Secondary | ICD-10-CM | POA: Diagnosis not present

## 2023-10-20 DIAGNOSIS — L821 Other seborrheic keratosis: Secondary | ICD-10-CM | POA: Diagnosis not present

## 2023-10-20 DIAGNOSIS — D2272 Melanocytic nevi of left lower limb, including hip: Secondary | ICD-10-CM | POA: Diagnosis not present

## 2023-10-20 DIAGNOSIS — L578 Other skin changes due to chronic exposure to nonionizing radiation: Secondary | ICD-10-CM | POA: Diagnosis not present

## 2023-10-20 DIAGNOSIS — Z86018 Personal history of other benign neoplasm: Secondary | ICD-10-CM | POA: Diagnosis not present

## 2023-10-20 DIAGNOSIS — D225 Melanocytic nevi of trunk: Secondary | ICD-10-CM | POA: Diagnosis not present

## 2023-10-26 ENCOUNTER — Inpatient Hospital Stay: Payer: Medicare Other | Attending: Hematology and Oncology

## 2023-10-26 ENCOUNTER — Inpatient Hospital Stay (HOSPITAL_BASED_OUTPATIENT_CLINIC_OR_DEPARTMENT_OTHER): Payer: Medicare Other | Admitting: Hematology and Oncology

## 2023-10-26 VITALS — BP 113/65 | HR 75 | Temp 98.3°F | Resp 18

## 2023-10-26 DIAGNOSIS — D46Z Other myelodysplastic syndromes: Secondary | ICD-10-CM

## 2023-10-26 DIAGNOSIS — D469 Myelodysplastic syndrome, unspecified: Secondary | ICD-10-CM | POA: Diagnosis not present

## 2023-10-26 DIAGNOSIS — Z1721 Progesterone receptor positive status: Secondary | ICD-10-CM | POA: Diagnosis not present

## 2023-10-26 DIAGNOSIS — Z170 Estrogen receptor positive status [ER+]: Secondary | ICD-10-CM | POA: Diagnosis not present

## 2023-10-26 DIAGNOSIS — Z79899 Other long term (current) drug therapy: Secondary | ICD-10-CM | POA: Diagnosis not present

## 2023-10-26 DIAGNOSIS — C50212 Malignant neoplasm of upper-inner quadrant of left female breast: Secondary | ICD-10-CM

## 2023-10-26 DIAGNOSIS — Z923 Personal history of irradiation: Secondary | ICD-10-CM | POA: Diagnosis not present

## 2023-10-26 DIAGNOSIS — Z79811 Long term (current) use of aromatase inhibitors: Secondary | ICD-10-CM | POA: Diagnosis not present

## 2023-10-26 DIAGNOSIS — Z9012 Acquired absence of left breast and nipple: Secondary | ICD-10-CM | POA: Diagnosis not present

## 2023-10-26 DIAGNOSIS — Z1731 Human epidermal growth factor receptor 2 positive status: Secondary | ICD-10-CM | POA: Insufficient documentation

## 2023-10-26 LAB — CBC WITH DIFFERENTIAL (CANCER CENTER ONLY)
Abs Immature Granulocytes: 0.01 K/uL (ref 0.00–0.07)
Basophils Absolute: 0 K/uL (ref 0.0–0.1)
Basophils Relative: 1 %
Eosinophils Absolute: 0 K/uL (ref 0.0–0.5)
Eosinophils Relative: 1 %
HCT: 37.9 % (ref 36.0–46.0)
Hemoglobin: 12.8 g/dL (ref 12.0–15.0)
Immature Granulocytes: 0 %
Lymphocytes Relative: 36 %
Lymphs Abs: 1 K/uL (ref 0.7–4.0)
MCH: 32.4 pg (ref 26.0–34.0)
MCHC: 33.8 g/dL (ref 30.0–36.0)
MCV: 95.9 fL (ref 80.0–100.0)
Monocytes Absolute: 0.2 K/uL (ref 0.1–1.0)
Monocytes Relative: 6 %
Neutro Abs: 1.5 K/uL — ABNORMAL LOW (ref 1.7–7.7)
Neutrophils Relative %: 56 %
Platelet Count: 40 K/uL — ABNORMAL LOW (ref 150–400)
RBC: 3.95 MIL/uL (ref 3.87–5.11)
RDW: 13.8 % (ref 11.5–15.5)
WBC Count: 2.7 K/uL — ABNORMAL LOW (ref 4.0–10.5)
nRBC: 0.7 % — ABNORMAL HIGH (ref 0.0–0.2)

## 2023-10-26 MED ORDER — LEVOFLOXACIN 750 MG PO TABS
750.0000 mg | ORAL_TABLET | Freq: Every day | ORAL | 0 refills | Status: AC
Start: 2023-10-26 — End: ?

## 2023-10-26 NOTE — Assessment & Plan Note (Signed)
 Left breast invasive ductal carcinoma ER/PR positive HER-2 negative Ki-67 94% diagnosed October 2013 status post 4 cycles of TCH Perjeta  followed by left mastectomy which revealed a 0.8 cm grade 1 IDC, 1 sentinel node micrometastatic disease ER/PR 100% positive status post radiation therapy and completed adjuvant trastuzumab  01/12/2013 currently on Arimidex  1 mg daily from 07/29/2012 completed May 202    Breast cancer surveillance:  Right breast mammograms: [facility].     Basal cell carcinoma on the face: Removed 09/18/2020   MDS (myelodysplastic syndrome), low grade Labs reviewed 06/26/2020: Platelet count 44 and white count 3, ANC 1.8 . 09/18/2020: Platelets 44 WBC 3 10/22/2021: WBC 3.3, hemoglobin 13.1, platelets 34 01/26/22: WBC 3.6, hemoglobin 13, platelets 34 06/03/2022: WBC 2.8, hemoglobin 13.7, platelets 40, ANC 1.5 10/28/2022: WBC 2.9, hemoglobin 13.1, platelets 40, ANC 1.6 04/28/2023: WBC 3.3, hemoglobin 13.2, platelets 40     She would like to see Dr. Deretha for consultation.    We will continue to watch and monitor Recheck labs in 6 months and follow-up Abnormal Pap test: If she were to require surgery like hysterectomy and BSO, then we will give platelet transfusion before and after surgery.   If she cannot get an appointment to see Dr. Deretha she would like to come back and see us  in 6 months.

## 2023-10-26 NOTE — Progress Notes (Signed)
 Patient Care Team: Okey Carlin Redbird, MD as PCP - General (Family Medicine) [place], Peter M, MD as PCP - Cardiology (Cardiology) Dolphus Reiter, MD as Consulting Physician (Rheumatology)  DIAGNOSIS:  Encounter Diagnosis  Name Primary?   Malignant neoplasm of upper-inner quadrant of left breast in female, estrogen receptor positive (HCC) Yes    SUMMARY OF ONCOLOGIC HISTORY: Oncology History  Breast cancer of upper-inner quadrant of left female breast (HCC)  12/31/2011 - 03/11/2012 Neo-Adjuvant Chemotherapy   Taxotere  Herceptin  Perjeta  4 followed by Herceptin  maintenance completed 01/29/2013   04/06/2012 Surgery   Left breast mastectomy: IDC grade 1; 0.8 cm, low-grade DCIS, LV I identified, 1/2 lymph node Micro met, ER/PR positive HER-2 positive Ki-67 94%   05/04/2012 - 06/20/2012 Radiation Therapy   Adjuvant radiation therapy   07/29/2012 -  Anti-estrogen oral therapy   Arimidex  1 mg daily   10/17/2014 Procedure   Bone marrow biopsy for thrombocytopenia and leukopenia: Hypercellular marrow with trilineage dysplasia most apparent in megakaryocytes with associated myelofibrosis: low-grade MDS with refractory cytopenias and multilineage dysplasia (20q- partial del)   MDS (myelodysplastic syndrome), low grade (HCC)  10/17/2014 Initial Diagnosis   MDS (myelodysplastic syndrome), low grade , refractory cytopenia with multi nature dysplasia cytogenetics -20q (associated with good prognosis MDS)     CHIEF COMPLIANT: Follow-up with the history of MDS  HISTORY OF PRESENT ILLNESS:  History of Present Illness Marie Anderson is a 78 year old female with thrombocytopenia who presents with nasal congestion and cough.  Nasal congestion and a nasal sound to her voice have persisted since mid-July, worsening after a trip to [place] . Decongestants, Flonase, and antibiotics have provided only temporary relief. She experiences yellow, pudding-like phlegm and a persistent cough.  Two weeks  ago, she fell while walking her dog, resulting in a painful leg injury. A CT scan was performed due to her low platelet count, typically around 35-40. The ankle doctor initially considered surgery but reconsidered after learning about her platelet count. She is scheduled for a follow-up to assess healing.  Her white blood cell count is 2.7. She is taking probiotics to manage gastrointestinal symptoms from antibiotics.     ALLERGIES:  is allergic to cephalexin, codeine, and tape.  MEDICATIONS:  Current Outpatient Medications  Medication Sig Dispense Refill   Acetaminophen  (TYLENOL  PO) Take 1 tablet by mouth daily as needed (Migraine). (Patient not taking: Reported on 10/13/2023)     Calcium Carbonate-Vit D-Min (CALCIUM 1200 PO) Take 1 tablet by mouth every morning.     cholecalciferol (VITAMIN D ) 1000 UNITS tablet Take by mouth daily.      cyanocobalamin 100 MCG tablet Take 100 mcg by mouth daily.     hydrochlorothiazide  (HYDRODIURIL ) 25 MG tablet Take 1 tablet (25 mg total) by mouth daily. Please call [phone] to schedule an appointment for future refills. Thank you. 90 tablet 3   metoprolol  succinate (TOPROL -XL) 100 MG 24 hr tablet Take 1 tablet (100 mg total) by mouth daily. TAKE WITH OR IMMEDIATELY FOLLOWING A MEAL. Please call [phone] to schedule an appointment for future refills. Thank you. 90 tablet 3   vitamin E 200 UNIT capsule Take 200 Units by mouth every morning.      zolpidem  (AMBIEN  CR) 12.5 MG CR tablet Take 1 tablet (12.5 mg total) by mouth at bedtime as needed. for sleep 30 tablet 3   No current facility-administered medications for this visit.    PHYSICAL EXAMINATION: ECOG PERFORMANCE STATUS: 1 - Symptomatic but completely ambulatory  Vitals:  10/26/23 1021  BP: 113/65  Pulse: 75  Resp: 18  Temp: 98.3 F (36.8 C)  SpO2: 98%   Filed Weights      LABORATORY DATA:  I have reviewed the data as listed    Latest Ref Rng & Units 02/22/2023    4:48 AM  02/21/2023   11:13 AM 01/26/2022    9:17 AM  CMP  Glucose 70 - 99 mg/dL 90  895  93   BUN 8 - 23 mg/dL 18  33  13   Creatinine 0.44 - 1.00 mg/dL 9.27  8.80  9.17   Sodium 135 - 145 mmol/L 139  139  141   Potassium 3.5 - 5.1 mmol/L 3.6  3.5  3.7   Chloride 98 - 111 mmol/L 111  103  105   CO2 22 - 32 mmol/L 22  27  31    Calcium 8.9 - 10.3 mg/dL 7.4  8.3  9.1   Total Protein 6.5 - 8.1 g/dL  6.7  7.1   Total Bilirubin <1.2 mg/dL  1.1  0.8   Alkaline Phos 38 - 126 U/L  42  56   AST 15 - 41 U/L  30  18   ALT 0 - 44 U/L  18  11     Lab Results  Component Value Date   WBC 2.7 (L) 10/26/2023   HGB 12.8 10/26/2023   HCT 37.9 10/26/2023   MCV 95.9 10/26/2023   PLT 40 (L) 10/26/2023   NEUTROABS 1.5 (L) 10/26/2023    ASSESSMENT & PLAN:  Breast cancer of upper-inner quadrant of left female breast (HCC) Left breast invasive ductal carcinoma ER/PR positive HER-2 negative Ki-67 94% diagnosed October 2013 status post 4 cycles of TCH Perjeta  followed by left mastectomy which revealed a 0.8 cm grade 1 IDC, 1 sentinel node micrometastatic disease ER/PR 100% positive status post radiation therapy and completed adjuvant trastuzumab  01/12/2013 currently on Arimidex  1 mg daily from 07/29/2012 completed May 202    Breast cancer surveillance:  Right breast mammograms: [facility].     Basal cell carcinoma on the face: Removed 09/18/2020   MDS (myelodysplastic syndrome), low grade Labs reviewed 06/26/2020: Platelet count 44 and white count 3, ANC 1.8 . 09/18/2020: Platelets 44 WBC 3 10/22/2021: WBC 3.3, hemoglobin 13.1, platelets 34 01/26/22: WBC 3.6, hemoglobin 13, platelets 34 06/03/2022: WBC 2.8, hemoglobin 13.7, platelets 40, ANC 1.5 10/28/2022: WBC 2.9, hemoglobin 13.1, platelets 40, ANC 1.6 04/28/2023: WBC 3.3, hemoglobin 13.2, platelets 40   10/26/2023: WBC 2.7, hemoglobin 12.8, platelets 40, ANC 1.5  Upper respiratory infection for the past month: Improved with Z-Pak but continues to  have severe congestion and nasal voice.  I sent a prescription for levofloxacin . She wanted to see Dr. Nelida but somehow their office have not gotten back to her.  Ankle fracture: If she needs surgery we can set support her with platelet transfusions as needed.   We will continue to watch and monitor Recheck labs in 6 months and follow-up   Assessment & Plan Prolonged upper respiratory infection with sinus congestion and cough Prolonged infection since mid-July with nasal congestion and cough. Initial antibiotics provided partial relief, suggesting bacterial component, but persistent symptoms indicate likely viral infection. - Prescribe Levaquin  once daily for 7 days. - Advise use of probiotics or yogurt to mitigate gastrointestinal side effects. - Recommend ENT evaluation if symptoms persist after antibiotic course.  Myelodysplastic syndrome, low grade Myelodysplastic syndrome with platelet count 35-40. White blood cell count 2.7  indicates infection response. Neutrophil count above 1 provides adequate infection protection. Discussed potential platelet transfusion need if surgery required due to low platelet count. - Monitor blood counts every 6 months. - Coordinate platelet transfusion if surgery is required.      No orders of the defined types were placed in this encounter.  The patient has a good understanding of the overall plan. she agrees with it. she will call with any problems that may develop before the next visit here. Total time spent: 30 mins including face to face time and time spent for planning, charting and co-ordination of care   Naomi MARLA Chad, MD 10/26/23

## 2023-10-27 DIAGNOSIS — S8261XA Displaced fracture of lateral malleolus of right fibula, initial encounter for closed fracture: Secondary | ICD-10-CM | POA: Diagnosis not present

## 2023-10-27 DIAGNOSIS — M79671 Pain in right foot: Secondary | ICD-10-CM | POA: Diagnosis not present

## 2023-11-11 DIAGNOSIS — H3561 Retinal hemorrhage, right eye: Secondary | ICD-10-CM | POA: Diagnosis not present

## 2023-11-11 DIAGNOSIS — H43813 Vitreous degeneration, bilateral: Secondary | ICD-10-CM | POA: Diagnosis not present

## 2023-11-11 DIAGNOSIS — H2513 Age-related nuclear cataract, bilateral: Secondary | ICD-10-CM | POA: Diagnosis not present

## 2023-11-11 DIAGNOSIS — H35371 Puckering of macula, right eye: Secondary | ICD-10-CM | POA: Diagnosis not present

## 2023-11-29 DIAGNOSIS — Z1231 Encounter for screening mammogram for malignant neoplasm of breast: Secondary | ICD-10-CM | POA: Diagnosis not present

## 2023-11-29 DIAGNOSIS — N958 Other specified menopausal and perimenopausal disorders: Secondary | ICD-10-CM | POA: Diagnosis not present

## 2023-11-29 DIAGNOSIS — S8261XD Displaced fracture of lateral malleolus of right fibula, subsequent encounter for closed fracture with routine healing: Secondary | ICD-10-CM | POA: Diagnosis not present

## 2023-11-29 DIAGNOSIS — Z13820 Encounter for screening for osteoporosis: Secondary | ICD-10-CM | POA: Diagnosis not present

## 2023-12-02 ENCOUNTER — Other Ambulatory Visit: Payer: Self-pay | Admitting: Obstetrics and Gynecology

## 2023-12-02 DIAGNOSIS — R928 Other abnormal and inconclusive findings on diagnostic imaging of breast: Secondary | ICD-10-CM

## 2023-12-08 ENCOUNTER — Ambulatory Visit
Admission: RE | Admit: 2023-12-08 | Discharge: 2023-12-08 | Disposition: A | Discharge status: Home / Self Care | Source: Ambulatory Visit | Attending: Obstetrics and Gynecology | Admitting: Obstetrics and Gynecology

## 2023-12-08 ENCOUNTER — Other Ambulatory Visit: Payer: Self-pay | Admitting: Obstetrics and Gynecology

## 2023-12-08 DIAGNOSIS — R928 Other abnormal and inconclusive findings on diagnostic imaging of breast: Secondary | ICD-10-CM

## 2023-12-08 DIAGNOSIS — N6489 Other specified disorders of breast: Secondary | ICD-10-CM | POA: Diagnosis not present

## 2023-12-08 DIAGNOSIS — Z853 Personal history of malignant neoplasm of breast: Secondary | ICD-10-CM | POA: Diagnosis not present

## 2023-12-09 DIAGNOSIS — Z6831 Body mass index (BMI) 31.0-31.9, adult: Secondary | ICD-10-CM | POA: Diagnosis not present

## 2023-12-09 DIAGNOSIS — Z124 Encounter for screening for malignant neoplasm of cervix: Secondary | ICD-10-CM | POA: Diagnosis not present

## 2023-12-20 DIAGNOSIS — S8261XD Displaced fracture of lateral malleolus of right fibula, subsequent encounter for closed fracture with routine healing: Secondary | ICD-10-CM | POA: Diagnosis not present

## 2023-12-20 DIAGNOSIS — M25571 Pain in right ankle and joints of right foot: Secondary | ICD-10-CM | POA: Diagnosis not present

## 2023-12-27 DIAGNOSIS — S8261XD Displaced fracture of lateral malleolus of right fibula, subsequent encounter for closed fracture with routine healing: Secondary | ICD-10-CM | POA: Diagnosis not present

## 2023-12-27 DIAGNOSIS — M25571 Pain in right ankle and joints of right foot: Secondary | ICD-10-CM | POA: Diagnosis not present

## 2023-12-29 DIAGNOSIS — S8261XD Displaced fracture of lateral malleolus of right fibula, subsequent encounter for closed fracture with routine healing: Secondary | ICD-10-CM | POA: Diagnosis not present

## 2023-12-31 ENCOUNTER — Other Ambulatory Visit: Payer: Self-pay | Admitting: Hematology and Oncology

## 2023-12-31 DIAGNOSIS — F5101 Primary insomnia: Secondary | ICD-10-CM

## 2023-12-31 DIAGNOSIS — D46Z Other myelodysplastic syndromes: Secondary | ICD-10-CM

## 2023-12-31 MED ORDER — ZOLPIDEM TARTRATE ER 12.5 MG PO TBCR: 12.5000 mg | EXTENDED_RELEASE_TABLET | Freq: Every evening | ORAL | 0 refills | Status: DC | PRN

## 2023-12-31 NOTE — Progress Notes (Signed)
 Ambien  prescription filled on behalf of Dr Odean for chronic insomnia.  Marie Anderson

## 2024-01-03 ENCOUNTER — Other Ambulatory Visit: Payer: Self-pay | Admitting: Hematology and Oncology

## 2024-01-03 DIAGNOSIS — M25571 Pain in right ankle and joints of right foot: Secondary | ICD-10-CM | POA: Diagnosis not present

## 2024-01-03 DIAGNOSIS — S8261XD Displaced fracture of lateral malleolus of right fibula, subsequent encounter for closed fracture with routine healing: Secondary | ICD-10-CM | POA: Diagnosis not present

## 2024-01-10 DIAGNOSIS — M25571 Pain in right ankle and joints of right foot: Secondary | ICD-10-CM | POA: Diagnosis not present

## 2024-01-10 DIAGNOSIS — S8261XD Displaced fracture of lateral malleolus of right fibula, subsequent encounter for closed fracture with routine healing: Secondary | ICD-10-CM | POA: Diagnosis not present

## 2024-01-17 DIAGNOSIS — M25571 Pain in right ankle and joints of right foot: Secondary | ICD-10-CM | POA: Diagnosis not present

## 2024-01-17 DIAGNOSIS — S8261XD Displaced fracture of lateral malleolus of right fibula, subsequent encounter for closed fracture with routine healing: Secondary | ICD-10-CM | POA: Diagnosis not present

## 2024-01-20 ENCOUNTER — Encounter: Payer: Self-pay | Admitting: Podiatry

## 2024-01-20 ENCOUNTER — Ambulatory Visit: Admitting: Podiatry

## 2024-01-20 DIAGNOSIS — Z87898 Personal history of other specified conditions: Secondary | ICD-10-CM | POA: Insufficient documentation

## 2024-01-20 DIAGNOSIS — T451X5A Adverse effect of antineoplastic and immunosuppressive drugs, initial encounter: Secondary | ICD-10-CM

## 2024-01-20 DIAGNOSIS — D225 Melanocytic nevi of trunk: Secondary | ICD-10-CM | POA: Insufficient documentation

## 2024-01-20 DIAGNOSIS — M79675 Pain in left toe(s): Secondary | ICD-10-CM

## 2024-01-20 DIAGNOSIS — B351 Tinea unguium: Secondary | ICD-10-CM | POA: Diagnosis not present

## 2024-01-20 DIAGNOSIS — D2330 Other benign neoplasm of skin of unspecified part of face: Secondary | ICD-10-CM | POA: Insufficient documentation

## 2024-01-20 DIAGNOSIS — D2370 Other benign neoplasm of skin of unspecified lower limb, including hip: Secondary | ICD-10-CM | POA: Insufficient documentation

## 2024-01-20 DIAGNOSIS — G620 Drug-induced polyneuropathy: Secondary | ICD-10-CM

## 2024-01-20 DIAGNOSIS — M79674 Pain in right toe(s): Secondary | ICD-10-CM | POA: Diagnosis not present

## 2024-01-24 DIAGNOSIS — M25571 Pain in right ankle and joints of right foot: Secondary | ICD-10-CM | POA: Diagnosis not present

## 2024-01-24 DIAGNOSIS — S8261XD Displaced fracture of lateral malleolus of right fibula, subsequent encounter for closed fracture with routine healing: Secondary | ICD-10-CM | POA: Diagnosis not present

## 2024-01-29 ENCOUNTER — Encounter: Payer: Self-pay | Admitting: Podiatry

## 2024-01-29 NOTE — Progress Notes (Signed)
  Subjective:  Patient ID: Marie Anderson, female    DOB: 06-Apr-1945,  MRN: [id number]  Marie Anderson presents to clinic today for at risk foot care with h/o neuropathy secondary to chemotherapy and painful mycotic toenails of both feet that are difficult to trim. Pain interferes with daily activities and wearing enclosed shoe gear comfortably.  Chief Complaint  Patient presents with   Nail Problem    Thick painful toenails, 3 month follow up    New problem(s): None.   PCP is Okey Carlin Redbird, MD.  Allergies  Allergen Reactions   Cephalexin Other (See Comments)    Headache and nausea; Pt stated that she tolerated other abx in this class   Codeine Nausea Only   Tape Other (See Comments)    Sensitivity to bandaids and tape that cause blisters and redness   Tapentadol Other (See Comments)    tapentadol    Review of Systems: Negative except as noted in the HPI.  Objective: No changes noted in today's physical examination. There were no vitals filed for this visit. Marie Anderson is a pleasant 78 y.o. female in NAD. AAO x 3.  Vascular Examination: Capillary refill time immediate b/l. Vascular status intact b/l with palpable pedal pulses. Pedal hair present b/l. No pain with calf compression b/l. Skin temperature gradient WNL b/l. No cyanosis or clubbing b/l. No ischemia or gangrene noted b/l.   Neurological Examination: Pt has subjective symptoms of neuropathy. Protective sensation diminished with 10g monofilament b/l.  Dermatological Examination: Pedal skin with normal turgor, texture and tone b/l.  No open wounds. No interdigital macerations.   Toenails bilateral great toes, L 5th toe, R 2nd toe, R 3rd toe, and R 5th toe elongated, discolored, dystrophic, thickened, and crumbly with subungual debris and tenderness to dorsal palpation.   Musculoskeletal Examination: Muscle strength 5/5 to all lower extremity muscle groups bilaterally. No pain, crepitus or joint  limitation noted with ROM bilateral LE. No gross bony deformities bilaterally.  Radiographs: None  Assessment/Plan: 1. Pain due to onychomycosis of toenails of both feet   2. Chemotherapy-induced peripheral neuropathy   -Patient was evaluated today. All questions/concerns addressed on today's visit. -Patient to continue soft, supportive shoe gear daily. -Mycotic toenails bilateral great toes, bilateral 5th toes, R 2nd toe, and R 3rd toe were debrided in length and girth with sterile nail nippers and dremel without iatrogenic bleeding. -Patient/POA to call should there be question/concern in the interim.   Return in about 3 months (around 04/21/2024).  Delon LITTIE Merlin, DPM      [place] LOCATION: [address] [place] [address]                   Office [phone]   [facility] LOCATION: [address] [place], [place] [address] Office [phone]

## 2024-01-31 ENCOUNTER — Telehealth: Payer: Self-pay

## 2024-01-31 ENCOUNTER — Other Ambulatory Visit: Payer: Self-pay | Admitting: Hematology and Oncology

## 2024-01-31 DIAGNOSIS — D46Z Other myelodysplastic syndromes: Secondary | ICD-10-CM

## 2024-01-31 DIAGNOSIS — F5101 Primary insomnia: Secondary | ICD-10-CM

## 2024-01-31 MED ORDER — ZOLPIDEM TARTRATE ER 12.5 MG PO TBCR: 12.5000 mg | EXTENDED_RELEASE_TABLET | Freq: Every evening | ORAL | 3 refills | Status: AC | PRN

## 2024-01-31 NOTE — Telephone Encounter (Signed)
 Pt called to request refill for Zolpidem . Called pt to let her know I made MD aware of her request. She verbalized thanks and has no other concerns at this time.

## 2024-04-19 ENCOUNTER — Ambulatory Visit: Admitting: Podiatry

## 2024-04-27 ENCOUNTER — Inpatient Hospital Stay

## 2024-04-27 ENCOUNTER — Inpatient Hospital Stay: Admitting: Hematology and Oncology

## 2024-04-28 ENCOUNTER — Inpatient Hospital Stay

## 2024-04-28 ENCOUNTER — Inpatient Hospital Stay: Admitting: Adult Health

## 2024-07-13 ENCOUNTER — Ambulatory Visit: Admitting: Podiatry
